# Patient Record
Sex: Female | Born: 1990
Health system: Southern US, Community
[De-identification: ages and names within clinical notes are randomized; demographics above are authoritative.]

## PROBLEM LIST (undated history)

## (undated) DIAGNOSIS — H9191 Unspecified hearing loss, right ear: Secondary | ICD-10-CM

## (undated) DIAGNOSIS — F909 Attention-deficit hyperactivity disorder, unspecified type: Secondary | ICD-10-CM

## (undated) DIAGNOSIS — N926 Irregular menstruation, unspecified: Secondary | ICD-10-CM

## (undated) DIAGNOSIS — T7840XA Allergy, unspecified, initial encounter: Secondary | ICD-10-CM

## (undated) DIAGNOSIS — E282 Polycystic ovarian syndrome: Secondary | ICD-10-CM

## (undated) DIAGNOSIS — G8190 Hemiplegia, unspecified affecting unspecified side: Secondary | ICD-10-CM

## (undated) DIAGNOSIS — Z68.41 Body mass index (BMI) pediatric, greater than or equal to 95th percentile for age: Secondary | ICD-10-CM

## (undated) HISTORY — DX: Attention-deficit hyperactivity disorder, unspecified type: F90.9

## (undated) HISTORY — DX: Hemiplegia, unspecified affecting unspecified side: G81.90

## (undated) HISTORY — DX: Irregular menstruation, unspecified: N92.6

## (undated) HISTORY — DX: Allergy, unspecified, initial encounter: T78.40XA

## (undated) HISTORY — DX: Unspecified hearing loss, right ear: H91.91

## (undated) HISTORY — DX: Body mass index (bmi) pediatric, greater than or equal to 95th percentile for age: Z68.54

## (undated) HISTORY — PX: TONSILLECTOMY: SUR1361

---
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
## 2011-01-07 ENCOUNTER — Telehealth: Payer: Self-pay | Admitting: Internal Medicine

## 2011-01-07 MED ORDER — METHYLPHENIDATE HCL ER (OSM) 36 MG PO TBCR: EXTENDED_RELEASE_TABLET | ORAL | Status: DC

## 2011-01-07 NOTE — Telephone Encounter (Signed)
Pt needs a script for Concerta 36 mg 2 day. Pls call when ready for pick up.

## 2011-01-07 NOTE — Telephone Encounter (Signed)
Pt's mom aware that rx will be ready in the am.

## 2011-02-10 ENCOUNTER — Telehealth: Payer: Self-pay | Admitting: *Deleted

## 2011-02-10 MED ORDER — METHYLPHENIDATE HCL ER (OSM) 36 MG PO TBCR: EXTENDED_RELEASE_TABLET | ORAL | Status: DC

## 2011-02-10 NOTE — Telephone Encounter (Signed)
Mom aware that rx will be ready in am.

## 2011-02-10 NOTE — Telephone Encounter (Signed)
Refill on concerta  

## 2011-02-14 ENCOUNTER — Ambulatory Visit: Payer: Self-pay | Admitting: Internal Medicine

## 2011-02-17 ENCOUNTER — Encounter: Payer: Self-pay | Admitting: Internal Medicine

## 2011-02-18 ENCOUNTER — Encounter: Payer: Self-pay | Admitting: Internal Medicine

## 2011-02-18 ENCOUNTER — Ambulatory Visit (INDEPENDENT_AMBULATORY_CARE_PROVIDER_SITE_OTHER): Payer: Federal, State, Local not specified - PPO | Admitting: Internal Medicine

## 2011-02-18 VITALS — BP 120/80 | HR 72 | Ht 66.0 in | Wt 191.0 lb

## 2011-02-18 DIAGNOSIS — H9190 Unspecified hearing loss, unspecified ear: Secondary | ICD-10-CM

## 2011-02-18 DIAGNOSIS — Z0000 Encounter for general adult medical examination without abnormal findings: Secondary | ICD-10-CM | POA: Insufficient documentation

## 2011-02-18 DIAGNOSIS — Z68.41 Body mass index (BMI) pediatric, greater than or equal to 95th percentile for age: Secondary | ICD-10-CM | POA: Insufficient documentation

## 2011-02-18 DIAGNOSIS — J309 Allergic rhinitis, unspecified: Secondary | ICD-10-CM

## 2011-02-18 DIAGNOSIS — Z00129 Encounter for routine child health examination without abnormal findings: Secondary | ICD-10-CM

## 2011-02-18 DIAGNOSIS — F909 Attention-deficit hyperactivity disorder, unspecified type: Secondary | ICD-10-CM

## 2011-02-18 DIAGNOSIS — D485 Neoplasm of uncertain behavior of skin: Secondary | ICD-10-CM

## 2011-02-18 DIAGNOSIS — J351 Hypertrophy of tonsils: Secondary | ICD-10-CM

## 2011-02-18 DIAGNOSIS — J45909 Unspecified asthma, uncomplicated: Secondary | ICD-10-CM

## 2011-02-18 DIAGNOSIS — Z0100 Encounter for examination of eyes and vision without abnormal findings: Secondary | ICD-10-CM

## 2011-02-18 DIAGNOSIS — IMO0002 Reserved for concepts with insufficient information to code with codable children: Secondary | ICD-10-CM

## 2011-02-18 HISTORY — DX: Body mass index (BMI) pediatric, greater than or equal to 95th percentile for age: Z68.54

## 2011-02-18 HISTORY — DX: Reserved for concepts with insufficient information to code with codable children: IMO0002

## 2011-02-18 LAB — POCT HEMOGLOBIN: Hemoglobin: 13.7

## 2011-02-18 MED ORDER — METHYLPHENIDATE HCL ER (OSM) 36 MG PO TBCR: EXTENDED_RELEASE_TABLET | ORAL | Status: DC

## 2011-02-18 NOTE — Progress Notes (Signed)
Subjective:     History was provided by the Patient.  And mom  Glenda Mendez is a 20 y.o. female who is here for this wellness visit. Since her last visit she's been doing fairly well. No recent asthma attacks. Is on her controller medicine. Did have a skin infection on the scalp treated at [facility]. Check tube bumps on her skin one on the chest and one on the left shoulder. The one on the left shoulder occurred when she was about 15 and with flat and is now bumpy and occasionally tender. ADHD:  Medication seems to be doing well would need refills she is going to go to college next year at [employer] and studied business and anterior design and transferred to [employer] if her grades are good enough. She is to get her tonsils out this summer. June 10  Current Issues: Current concerns include:None  H (Home) Family Relationships: good Communication: good with parents Responsibilities: has responsibilities at home  E (Education): Grades: Cs School: good attendance Future Plans: college  A (Activities) Sports: no sports Exercise: No Activities: music Friends: Yes   A (Auton/Safety) Auto: wears seat belt Bike: does not ride Safety: can swim and uses sunscreen  D (Diet) Diet: balanced diet Risky eating habits: tends to overeat Intake: high fat diet Body Image: positive body image  Drugs Tobacco: No Alcohol: No Drugs: No  Sex Activity: abstinent  Suicide Risk Emotions: anxiety and anger Depression: feelings of depression Suicidal: denies suicidal ideation     Objective:     Filed Vitals:   02/18/11 0858  BP: 120/80  Pulse: 72  Height: 5\' 6"  (1.676 m)  Weight: 191 lb (86.637 kg)   Growth parameters are noted and are appropriate for age.  General:   alert, cooperative and appears stated age  Gait:   normal  Skin:    mid chest there is a 3 mm  cystic type lesion with a central pore. Left shoulder a 5-6 mm raised pink fibrous appearing small with  skin markings on top of it, no central pore   Oral cavity:   lips, mucosa, and tongue normal; teeth and gums normal  tonsils 2+ not inflamed   Eyes:   sclerae white, pupils equal and reactive, red reflex normal bilaterally  Ears:   normal bilaterally decreae hearing   Neck:   normal, supple, no cervical tenderness  Old scar  nochange or new masses   Lungs:  clear to auscultation bilaterally and normal percussion bilaterally  Heart:   regular rate and rhythm, S1, S2 normal, no murmur, click, rub or gallop and normal apical impulse  Abdomen:  soft, non-tender; bowel sounds normal; no masses,  no organomegaly  GU:  normal female  Extremities:   extremities normal, atraumatic, no cyanosis or edema  Neuro:  normal without focal findings, mental status, speech normal, alert and oriented x3, PERLA, reflexes normal and symmetric and gait and station normal  Decrease hearing    I don't elicit grossly the right hemiparesthesias with history of same.     lymph nodes no significant adenopathy Breast: normal by inspection . No dimpling, discharge, masses, tenderness or discharge . Tanner 4-5   Assessment:   Glenda Mendez y.o. female  . Asthma, allergies ADHD New skin lesions growing slowly question fibroma versus other we'll get dermatology to check. Tonsillar hypertrophy recurrent infection    Plan:   1. Anticipatory guidance discussed. HO .  2. Follow-up visit in 12 months for next  wellness visit, or sooner as needed.  6 months for medicine check  We'll do dermatology referral Intensify lifestyle changes her weight has gone back up some from her initial loss with healthy eating. Continue same medication in ADHD discussed with mom also

## 2011-02-18 NOTE — Patient Instructions (Addendum)
ROV in 6 months  WIll arrange a dermatology appt.  Improved  Healthy eating and exercise   Track your periods.  Call if want to  Increase   meds.

## 2011-05-19 ENCOUNTER — Telehealth: Payer: Self-pay | Admitting: Internal Medicine

## 2011-05-19 MED ORDER — METHYLPHENIDATE HCL ER (OSM) 36 MG PO TBCR: EXTENDED_RELEASE_TABLET | ORAL | Status: DC

## 2011-05-19 NOTE — Telephone Encounter (Signed)
Mom stated that patient is on Concerta 36mg  bid. Pharmacy only filled it for 1qd. Pt is almost out.

## 2011-05-19 NOTE — Telephone Encounter (Signed)
Rx is ready to pick up. Mom aware of this.

## 2011-06-04 ENCOUNTER — Encounter: Payer: Self-pay | Admitting: Internal Medicine

## 2011-06-04 ENCOUNTER — Ambulatory Visit (INDEPENDENT_AMBULATORY_CARE_PROVIDER_SITE_OTHER): Payer: Federal, State, Local not specified - PPO | Admitting: Internal Medicine

## 2011-06-04 VITALS — BP 128/72 | HR 88 | Wt 182.0 lb

## 2011-06-04 DIAGNOSIS — Z3009 Encounter for other general counseling and advice on contraception: Secondary | ICD-10-CM

## 2011-06-04 DIAGNOSIS — L708 Other acne: Secondary | ICD-10-CM

## 2011-06-04 DIAGNOSIS — N926 Irregular menstruation, unspecified: Secondary | ICD-10-CM

## 2011-06-04 DIAGNOSIS — R519 Headache, unspecified: Secondary | ICD-10-CM | POA: Insufficient documentation

## 2011-06-04 DIAGNOSIS — L709 Acne, unspecified: Secondary | ICD-10-CM | POA: Insufficient documentation

## 2011-06-04 DIAGNOSIS — R51 Headache: Secondary | ICD-10-CM

## 2011-06-04 DIAGNOSIS — Z30011 Encounter for initial prescription of contraceptive pills: Secondary | ICD-10-CM | POA: Insufficient documentation

## 2011-06-04 HISTORY — DX: Irregular menstruation, unspecified: N92.6

## 2011-06-04 MED ORDER — NORETHINDRONE ACET-ETHINYL EST 1-20 MG-MCG PO TABS: 1.0000 | ORAL_TABLET | Freq: Every day | ORAL | Status: DC

## 2011-06-04 NOTE — Patient Instructions (Addendum)
Take 2 aleve 2 x per day for the next day or so. Get regular sleep. Irregular or not enough sleep  may be adding to your headaches . Can begin hormonal therapy for your periods with your next cycle.   One per day .Track your headaches and bleeding .   Then ROV in 2-3 months .Oral Contraceptives (Birth Control Pills) Oral contraceptives (OCs) are medicines taken to prevent pregnancy. They are the most widely used method of birth control. OCs work by preventing the ovaries from releasing eggs. The OC hormones also cause the mucus on the cervix to thicken, preventing the sperm from entering the uterus. They also cause the lining of the uterus to become thin, not allowing a fertilized egg to attach to the inside of the uterus. OCs have a failure rate of less than 1%, when taken exactly as prescribed. THERE ARE 2 TYPES OF OC  OC that contains a mix of estrogen and progesterone hormones is the most common OC used. It is taken for 21 days, followed by 7 days of not taking the OC hormones. It can be packaged as 28 pills, with the last 7 pills being inactive. You take a pill every day. This way you do not need to remember when to restart taking the active pills. Most women will begin their menstrual period 2 to 3 days after taking the hormone pill. The menstrual period is usually lighter and shorter. This combination OC should not be taken if you are breast-feeding.   The progesterone only (minipill) OC does not contain estrogen. It is taken every day, continuously. You may have only spotting for a period, or no period at all. The progesterone only OC can be taken if you are breast-feeding your baby.  OCs come in:  Packs of 21 pills, with no pills to take for 7 days after the last pill.   Packs of 28 pills, with a pill to take every day. The last 7 pills are without hormones.   Packs of 91 pills (continuous or extended use), with a pill to take every day. The first 84 pills contain the hormones, and the  last 7 pills do not. That is when you will have your menstrual period. You will not have a menstrual period during the time you are taking the first 84 pills.  HOW TO TAKE OC Your caregiver may advise you on how to start taking the first cycle of OCs. Otherwise, you can:  Start on day 1 or day 5 of your menstrual period, taking the first pack of the OC. You will not need any backup contraceptive protection with this start time.   Start on the first Sunday after your menstrual period, day 7 of your menstrual period, or the day you get your prescription. In these cases, you will need backup contraceptive protection for the first cycle.  No matter which day you start the OC, you will always start a new pack on that same day of the week. It is a good idea to have an extra pack of OCs and a backup contraceptive method available, in case you miss some pills or lose your OC pack. COMMON REASONS FOR FAILURE   Forgetting to take the pill at the same time every day.   Poor absorption of the pill from the stomach into the bloodstream. This can be caused by diarrhea, vomiting, and the use of some medicines that kill germs (antibiotics).   Stomach or intestinal disease.   Taking OCs with  other medicines that may make them less effective (carbamazepine, phenytoin, phenobarbital, rifampin).   Using OCs that have passed their expiration dates.   Forgetting to restart the pills on day 7, when using the packs of 21 pills.  If you forget to take 1 pill, take it as soon as you remember, and take the next pill at the regular time. If you miss 2 or more pills, use backup birth control until your next menstrual period starts. Also, you may have vaginal spotting or bleeding when you miss 2 or more OC pills. If you use the pack of 28 pills or 91 pills, and you miss 1 of the last 7 pills (pills with no hormones), it will not matter. Just throw away the rest of the non-hormone pills and start a new 28 or 91 pill  pack. COMMON USES OF OC  Decreasing premenstrual problems (symptoms).  Treating menstrual period cramps.   Avoiding becoming pregnant.   Regulating the menstrual cycle.   Treating acne.   Decreasing the heavy menstrual flow.   Treating dysfunctional (abnormal) uterine bleeding.   Treating chronic pelvic pain.   Treating polycystic ovary syndrome (ovary does not ovulate and produces tiny cysts).   Treating endometriosis (uterus lining growing in the pelvis, tubes, and ovaries).   Can be used for emergency contraception.   OCs DO NOT prevent sexually transmitted diseases (STDs). Safer sex practices, such as using condoms along with the pill, can help prevent STDs.  BENEFITS  OC reduces the risk of:   Cancer of the ovary and uterus.   Ovarian cysts.   Pelvic infection.   Symptoms of polycystic ovary syndrome.   Loss of bone (osteoporosis).   Noncancerous (benign) breast disease (fibrocystic breast changes).   Lack of red blood cells (anemia) from heavy or long menstrual periods.   Pregnancy occurring outside the uterus (tubal pregnancy).   Acne.   Slows down the flow of heavy menstrual periods.   Sometimes helps control premenstrual syndrome (PMS).   Stops menstrual cramps and pain.   Controls irregular menstrual periods.   Can be used as emergency contraception.  YOU SHOULD NOT TAKE THE PILL IF YOU:  Are pregnant, or are trying to get pregnant.  Have unexplained or abnormal vaginal bleeding.   Have a history of liver disease, stroke, or heart attack.   Smoke.   Have a history of blood clots, cancer, or heart problems.   Have gallbladder disease.   Have breast cancer or suspect breast cancer.   Have or suspect pelvic cancer.   Have high blood pressure.  Have high cholesterol or high triglycerides.   Have mental depression.   Are breast-feeding, except for the progesterone only OC, with approval of your caregiver.   Have diabetes with  kidney, eye, or other blood vessel complications. Or if you have diabetes for 20 years or more.   Have heart valve disease.   Have migraine headaches. They may get worse.   Before taking the pill, a woman will have a physical exam and Pap test. Your caregiver may order blood tests to check blood sugar and cholesterol levels, and other blood tests that may be necessary. SIDE EFFECTS OF THE PILL MAY INCLUDE:  Breast tenderness, pain and discharge.   Change in sex drive (increased or decreased libido).   Depression.   Being tired often.   Headaches.   Anxiety.   Irregular spotting or vaginal bleeding for a couple of months.  Leg pain.   Cramps, or  swelling of your limbs (extremities).   Mood swings.   Weight loss or weight gain.   Feeling sick to your stomach (nausea).   Change in appetite (hunger).  Loss of hair.   Yeast or fungus vaginal infection.   Nervousness.   Rash.   Acne.   No menstrual period (amenorrhea).   When starting an OC, it is usually best to allow 2-3 months, if possible, for the body to adjust (before stopping because of side effects). This allows for adjustment to the changes in hormone levels. If a woman continues to have side effects, it may be possible to change to a different OC. It is important to discuss side effects with your caregiver. Often, changing to a different pill causes the side effects to subside. RISKS AND COMPLICATIONS  Blood clots of the leg, heart, lung, or brain.  High blood pressure.   Gallbladder disease.  Liver tumors.   Brain bleeding (hemorrhage).  Slight risk of breast cancer.   HOME CARE INSTRUCTIONS  Do not smoke.   Only take over-the-counter or prescription medicines for pain, discomfort, fever, or breast tenderness as directed by your caregiver.   Always use a condom to protect against sexually transmitted disease. OCs do not protect against STDs.   Keep a calendar, marking your menstrual period days.   Recommendations, types, and dosages of OC use change continually. Discuss your choices with your caregiver, and decide what is best for you. There are always exceptions to guidelines. You should always read the information that comes with the OC, and check whether there are any new recommendations or guidelines. SEEK MEDICAL CARE IF:  You develop nausea and vomiting from the OC.   You have abnormal vaginal discharge.   You need treatment for headaches.   You develop a rash.   You miss your menstrual period.   You develop abnormal vaginal bleeding.   You are losing your hair.   You need treatment for mood swings or depression.   You get dizzy when taking the OC.   You develop acne from taking the OC.  SEEK IMMEDIATE MEDICAL CARE IF:  You develop leg pain.   You develop chest pain.   You develop shortness of breath.   You develop abdominal pain.   You have an uncontrolled headache.   You develop numbness or slurred speech.   You develop visual problems (loss of vision, double or blurry vision).   You develop heavy vaginal bleeding.  If you are taking the pill, STOP RIGHT AWAY and CALL YOUR CAREGIVER IMMEDIATELY if the following occur:  You develop chest pain and shortness of breath.   You develop pain, redness, and swelling in the legs.   You develop severe headaches, visual changes, or belly (abdominal) pain.   You develop severe depression.   You become pregnant.  Document Released: 12/27/2002 Document Re-Released: 10/28/2009 [facility] Patient Information 2011 [place], [place].

## 2011-06-04 NOTE — Progress Notes (Signed)
  Subjective:    Patient ID: Glenda Mendez, female    DOB: April 24, 1991, 20 y.o.   MRN: [id number]  HPI Onset of headache at church and no help with  Med . And  Hard to sleep.   Bifrontal.  Waxes and wanes  . Aggravator ?  mitigator rest but wakes up.   No vision change .   ? Some worsening  With bright lights  .  Had to leave [employer] work.  Sleep less  Before onset of headaches .  Works from 4-10 sometimes. FRiday.   1 am sleep    Weight gone up from sleep issues.   Drinks lots of fluids.  sodas and punch.   No caffiene .    Today less   ? Worse with weather .   Onset 10/10 and now 5/10  Nausea with this. Nose stops up with HA .   Aching pain.  Front .   She is interested in beginning oral contraceptive pills to regulate her periods which sometimes are regular. Sometimes has midcycle spotting. No risk of pregnancy or STD. She tends to get some other kinds of headaches around the time of her period but not this severe. She also would like a better control of her acne and is interested in taking hormones for this reason. Her mother is in agreement with this. Review of Systems Negative for fever or sinus infection chest pain shortness of breath asthma flare. No balance problems or vision changes. Ears get itchy at times. No current vomiting or diarrhea.  Past history family history social history reviewed in the electronic medical record.     Objective:   Physical Exam Well-developed well-nourished in no acute distress HEENT: Normocephalic ;atraumatic , Eyes;  PERRL, EOMs  Full, lids and conjunctiva clear,,Ears: no deformities, canals nl, TM landmarks normal, Nose: no deformity or discharge mildly congested Mouth : OP clear without lesion or edema . Neck no adenopathy Chest:  Clear to A&P without wheezes rales or rhonchi CV:  S1-S2 no gallops or murmurs peripheral perfusion is normal Neuro  oriented x 3 intact facial symmetry gait within normal limits no tremor. No focal atrophy balance  looks normal Skin: Mild acne on face. Mood no obvious depression anxiety good eye contact.     Assessment & Plan:  Headache No alarm features except this is new for her. There may be a migraine component. Further questioning she does seem to have some hormonally. Repeat induced or related headaches.    Acne mild to moderate face Irregular periods history of dysmenorrhea   not alarming but it sounds like she has sometimes  Ovulatory  spotting. No risk for infection or pregnancy Reasonable candidate for hormonal therapy. Discussed risk and benefits clotting potential to make headaches worse instead of better. She will implement lifestyle changes and monitoring her she begins her classes. In followup in 23 months.

## 2011-07-29 ENCOUNTER — Ambulatory Visit (INDEPENDENT_AMBULATORY_CARE_PROVIDER_SITE_OTHER): Payer: Federal, State, Local not specified - PPO | Admitting: Internal Medicine

## 2011-07-29 ENCOUNTER — Encounter: Payer: Self-pay | Admitting: Internal Medicine

## 2011-07-29 VITALS — BP 120/80 | HR 66 | Wt 174.0 lb

## 2011-07-29 DIAGNOSIS — Z23 Encounter for immunization: Secondary | ICD-10-CM

## 2011-07-29 DIAGNOSIS — R51 Headache: Secondary | ICD-10-CM

## 2011-07-29 DIAGNOSIS — Z68.41 Body mass index (BMI) pediatric, greater than or equal to 95th percentile for age: Secondary | ICD-10-CM

## 2011-07-29 DIAGNOSIS — F909 Attention-deficit hyperactivity disorder, unspecified type: Secondary | ICD-10-CM

## 2011-07-29 DIAGNOSIS — N926 Irregular menstruation, unspecified: Secondary | ICD-10-CM

## 2011-07-29 DIAGNOSIS — J45909 Unspecified asthma, uncomplicated: Secondary | ICD-10-CM

## 2011-07-29 MED ORDER — NORETHINDRONE ACET-ETHINYL EST 1-20 MG-MCG PO TABS: 1.0000 | ORAL_TABLET | Freq: Every day | ORAL | Status: DC

## 2011-07-29 NOTE — Assessment & Plan Note (Signed)
Improved on ocps hormonal therapy

## 2011-07-29 NOTE — Assessment & Plan Note (Signed)
Much better   On ocps contnue

## 2011-07-29 NOTE — Patient Instructions (Signed)
Continue  meds Continue lifestyle intervention healthy eating and exercise . Check up in 6 months or as needed

## 2011-07-29 NOTE — Assessment & Plan Note (Signed)
Improved  Has lost to 174 today Counseled. Healthy ls

## 2011-07-29 NOTE — Progress Notes (Signed)
  Subjective:    Patient ID: Glenda Mendez, female    DOB: 09/25/1991, 20 y.o.   MRN: [id number]  HPI Since last time  No more Headaches ( see last notes)   Now on ocps  Less  Bleeding and no cramps.   Has helped moodiness around periods.  Bleeds slightly heavy 2 days and then light . School. Doing ok intimidatading  But like  Its.   MOM says doing much better aroung periods Review of Systems Neg cp sob asthma flare     Objective:   Physical Exam  Wt Readings from Last 3 Encounters:  07/29/11 174 lb (78.926 kg) (93.65%*)  06/04/11 182 lb (82.555 kg) (95.40%*)  02/18/11 191 lb (86.637 kg) (96.74%*)   * Growth percentiles are based on CDC 2-20 Years data.   wdwn in nad  Neck no tenderness Chest:  Clear to A&P without wheezes rales or rhonchi CV:  S1-S2 no gallops or murmurs peripheral perfusion is normal Affect orented good eye contact looks well nl interaction      Assessment & Plan:

## 2011-07-29 NOTE — Assessment & Plan Note (Signed)
Stable on meds  First semester college

## 2011-07-30 ENCOUNTER — Encounter: Payer: Self-pay | Admitting: Internal Medicine

## 2011-09-04 ENCOUNTER — Telehealth: Payer: Self-pay | Admitting: *Deleted

## 2011-09-04 MED ORDER — METHYLPHENIDATE HCL ER (OSM) 36 MG PO TBCR: EXTENDED_RELEASE_TABLET | ORAL | Status: DC

## 2011-09-04 NOTE — Telephone Encounter (Signed)
Refill on concerta Mom aware that rx will be ready tomorrow.

## 2011-09-09 ENCOUNTER — Telehealth: Payer: Self-pay | Admitting: Internal Medicine

## 2011-09-09 NOTE — Telephone Encounter (Signed)
Chart opened in error

## 2011-10-20 ENCOUNTER — Ambulatory Visit (INDEPENDENT_AMBULATORY_CARE_PROVIDER_SITE_OTHER): Payer: Federal, State, Local not specified - PPO | Admitting: Internal Medicine

## 2011-10-20 ENCOUNTER — Encounter: Payer: Self-pay | Admitting: Internal Medicine

## 2011-10-20 VITALS — BP 112/70 | Temp 98.2°F | Wt 172.0 lb

## 2011-10-20 DIAGNOSIS — J069 Acute upper respiratory infection, unspecified: Secondary | ICD-10-CM

## 2011-10-20 DIAGNOSIS — J45909 Unspecified asthma, uncomplicated: Secondary | ICD-10-CM

## 2011-10-20 DIAGNOSIS — J309 Allergic rhinitis, unspecified: Secondary | ICD-10-CM

## 2011-10-20 DIAGNOSIS — J329 Chronic sinusitis, unspecified: Secondary | ICD-10-CM

## 2011-10-20 DIAGNOSIS — R040 Epistaxis: Secondary | ICD-10-CM

## 2011-10-20 MED ORDER — ALBUTEROL SULFATE 0.63 MG/3ML IN NEBU: INHALATION_SOLUTION | RESPIRATORY_TRACT | Status: DC

## 2011-10-20 MED ORDER — PREDNISONE 20 MG PO TABS: ORAL_TABLET | ORAL | Status: AC

## 2011-10-20 MED ORDER — AMOXICILLIN-POT CLAVULANATE 875-125 MG PO TABS: 1.0000 | ORAL_TABLET | Freq: Two times a day (BID) | ORAL | Status: AC

## 2011-10-20 NOTE — Patient Instructions (Signed)
Can add antibiotic for sinusitis if needed.  Will send in rx for box of albuterol  As needed. saline nose moisturizing will help  Call if  persistent or progressive  Expect improvement in the next 3 days or so.

## 2011-10-20 NOTE — Progress Notes (Signed)
  Subjective:    Patient ID: Glenda Mendez, female    DOB: 01-Feb-1991, 20 y.o.   MRN: [id number]  HPI Patient comes in today for SDA  For acute problem evaluation. Here with mom  Onset 3-4 days ago of above  illness   Nose congestion and bleeding  Right nostril .   Sneezing  Pre dating  Couple of weeks.  But no fever with this  Then 3-4 days ago had asthma flare and sob  Began on steroids  Per mom   Given 20 mg   ( few left ) and sat Sunday . With asthma sx.  Stopped up at night .  Sits up at night.   Nose itching and stopped up right more than left .  Cough some better  Asthma    Review of Systems No cp sob fever hemoptysis  No nvd  No rashes     Past history family history social history reviewed in the electronic medical record.     Objective:   Physical Exam  WDWN in NAD  quiet respirations; mildly congested  somewhat hoarse. Non toxic . HEENT: Normocephalic ;atraumatic , Eyes;  PERRL, EOMs  Full, lids and conjunctiva clear,,Ears: no deformities, canals nl, TM landmarks normal, Nose: no deformity or discharge but congested right mucoid yellow dc ;face minimally tender Mouth : OP clear without lesion or edema . Neck: Supple without adenopathy or masses or bruits Chest:  Clear to A&P without wheezes rales or rhonchi CV:  S1-S2 no gallops or murmurs peripheral perfusion is normal Skin :nl perfusion and no acute rashes      Assessment & Plan:   URI poss sinusitis   Viral  and allergic   underlying Nosebleeds   Anterior   Disc conservative management  And fu  If  persistent or progressive  Asthma mild flare    rx given for steroid burst and antibiotic if needed   Albuterol neb ok if needed ,

## 2011-12-23 ENCOUNTER — Telehealth: Payer: Self-pay | Admitting: *Deleted

## 2011-12-23 MED ORDER — METHYLPHENIDATE HCL ER (OSM) 36 MG PO TBCR: EXTENDED_RELEASE_TABLET | ORAL | Status: DC

## 2011-12-23 NOTE — Telephone Encounter (Signed)
Refills given to pt's mom

## 2011-12-26 ENCOUNTER — Telehealth: Payer: Self-pay | Admitting: Internal Medicine

## 2011-12-26 DIAGNOSIS — H9190 Unspecified hearing loss, unspecified ear: Secondary | ICD-10-CM

## 2011-12-26 NOTE — Telephone Encounter (Signed)
Pt need order for diagnostic hearing test at [facility] fax # [phone] Robbi Garter audiologist

## 2011-12-26 NOTE — Telephone Encounter (Signed)
Referral sent to [facility]. 

## 2012-03-03 DIAGNOSIS — R6250 Unspecified lack of expected normal physiological development in childhood: Secondary | ICD-10-CM | POA: Insufficient documentation

## 2012-03-03 DIAGNOSIS — K219 Gastro-esophageal reflux disease without esophagitis: Secondary | ICD-10-CM | POA: Insufficient documentation

## 2012-04-19 ENCOUNTER — Encounter: Payer: Self-pay | Admitting: Internal Medicine

## 2012-04-19 ENCOUNTER — Ambulatory Visit (INDEPENDENT_AMBULATORY_CARE_PROVIDER_SITE_OTHER): Payer: Federal, State, Local not specified - PPO | Admitting: Internal Medicine

## 2012-04-19 VITALS — BP 108/78 | HR 69 | Temp 98.7°F | Wt 188.0 lb

## 2012-04-19 DIAGNOSIS — N926 Irregular menstruation, unspecified: Secondary | ICD-10-CM

## 2012-04-19 DIAGNOSIS — F909 Attention-deficit hyperactivity disorder, unspecified type: Secondary | ICD-10-CM

## 2012-04-19 DIAGNOSIS — Z3041 Encounter for surveillance of contraceptive pills: Secondary | ICD-10-CM

## 2012-04-19 DIAGNOSIS — J45909 Unspecified asthma, uncomplicated: Secondary | ICD-10-CM

## 2012-04-19 MED ORDER — METHYLPHENIDATE HCL ER (OSM) 36 MG PO TBCR: EXTENDED_RELEASE_TABLET | ORAL | Status: DC

## 2012-04-19 MED ORDER — NORETHIN ACE-ETH ESTRAD-FE 1-20 MG-MCG PO TABS: 1.0000 | ORAL_TABLET | Freq: Every day | ORAL | Status: DC

## 2012-04-19 NOTE — Progress Notes (Signed)
Subjective:    Patient ID: Glenda Mendez, female    DOB: 1991-02-19, 21 y.o.   MRN: [id number]  HPI Patient comes in today for follow up of  multiple medical problems.  Med evaluation [facility] helping periods and nose noted would like to continue no sig has bleeding is normal no sig pain.  ADHD taking meds still Has had to take off school cause of money issues  And looking for job  Initially started at [employer] but didn't put her on schedule so looking else where No tad some caffeine sodas . Sleep ok   Review of Systems No cp sob constitutional issues no injury  Mood somewhat down not hopeless can talk to friends no job no car and transportation dependent. now.  Outpatient Encounter Prescriptions as of 04/19/2012  Medication Sig Dispense Refill  . albuterol (ACCUNEB) 0.63 MG/3ML nebulizer solution Please give pt 1 box.  75 mL  2  . azelastine (ASTELIN) 137 MCG/SPRAY nasal spray 1 spray by Nasal route 2 (two) times daily. Use in each nostril as directed       . beclomethasone (QVAR) 40 MCG/ACT inhaler Inhale 2 puffs into the lungs 2 (two) times daily.        . fexofenadine-pseudoephedrine (ALLEGRA-D 12 HOUR) 60-120 MG per tablet Take 1 tablet by mouth 2 (two) times daily.        . formoterol (FORADIL) 12 MCG capsule for inhaler Place 12 mcg into inhaler and inhale 2 (two) times daily.        . hydrOXYzine (ATARAX) 10 MG tablet Take 10 mg by mouth 3 (three) times daily as needed.        Marland Kitchen levocetirizine (XYZAL) 5 MG tablet Take 5 mg by mouth every evening.        . methylphenidate (CONCERTA) 36 MG CR tablet 2 po qd  60 tablet  0  . methylphenidate (CONCERTA) 36 MG CR tablet Take 2 once a day. Fill on or after 05/24/12  60 tablet  0  . methylphenidate (CONCERTA) 36 MG CR tablet Take 2 once a day. Fill on or after 06/24/12  60 tablet  0  . montelukast (SINGULAIR) 10 MG tablet Take 10 mg by mouth at bedtime.       . norethindrone-ethinyl estradiol (JUNEL FE,GILDESS FE,LOESTRIN FE) 1-20 MG-MCG tablet  Take 1 tablet by mouth daily.  1 Package  6  . omeprazole (PRILOSEC) 20 MG capsule Take 20 mg by mouth daily.        Marland Kitchen DISCONTD: methylphenidate (CONCERTA) 36 MG CR tablet Take 2 once a day. Fill on or after 01/23/12  60 tablet  0  . DISCONTD: methylphenidate (CONCERTA) 36 MG CR tablet Take 2 once a day. Fill on or after 02/22/12  60 tablet  0  . DISCONTD: methylphenidate (CONCERTA) 36 MG CR tablet 2 po qd  60 tablet  0  . DISCONTD: norethindrone-ethinyl estradiol (JUNEL FE,GILDESS FE,LOESTRIN FE) 1-20 MG-MCG tablet Take 1 tablet by mouth daily.         Past history family history social history reviewed in the electronic medical record.     Objective:   Physical Exam  BP 108/78  Pulse 69  Temp 98.7 F (37.1 C) (Oral)  Wt 188 lb (85.276 kg)  SpO2 96%  LMP 04/18/2012 Wt Readings from Last 3 Encounters:  04/19/12 188 lb (85.276 kg) (96.01%*)  10/20/11 172 lb (78.019 kg) (92.93%*)  07/29/11 174 lb (78.926 kg) (93.65%*)   * Growth percentiles are based  on CDC 2-20 Years data.  WDWN in nad heent grossly normal well groomed good eye contact somewhat subdued.   Chest:  Clear to A&P without wheezes rales or rhonchi CV:  S1-S2 no gallops or murmurs peripheral perfusion is normal Abdomen:  Sof,t normal bowel sounds without hepatosplenomegaly, no guarding rebound or masses no CVA tenderness No clubbing cyanosis or edema Neuro non focal       Assessment & Plan:  ADHD No se of meds Asthma stable Mood somewhat reactive depression cause of monetary factors and job searching . Disc strategies consider ing counseling  Or as k for help if continuing.or not getting better. OCP ok to continue adequate period control.  Plan reassessment in 6 months or as needed  Total visit > 50% spent counseling and coordinating care

## 2012-04-25 ENCOUNTER — Encounter: Payer: Self-pay | Admitting: Internal Medicine

## 2012-04-25 DIAGNOSIS — Z3041 Encounter for surveillance of contraceptive pills: Secondary | ICD-10-CM | POA: Insufficient documentation

## 2012-04-25 NOTE — Patient Instructions (Signed)
Continue same meds and fu in 5 months or as needed and discussed

## 2012-07-01 ENCOUNTER — Ambulatory Visit (INDEPENDENT_AMBULATORY_CARE_PROVIDER_SITE_OTHER): Payer: Federal, State, Local not specified - PPO | Admitting: Internal Medicine

## 2012-07-01 ENCOUNTER — Encounter: Payer: Self-pay | Admitting: Internal Medicine

## 2012-07-01 VITALS — BP 110/74 | HR 82 | Temp 98.5°F | Wt 200.0 lb

## 2012-07-01 DIAGNOSIS — J309 Allergic rhinitis, unspecified: Secondary | ICD-10-CM

## 2012-07-01 DIAGNOSIS — J329 Chronic sinusitis, unspecified: Secondary | ICD-10-CM

## 2012-07-01 DIAGNOSIS — J069 Acute upper respiratory infection, unspecified: Secondary | ICD-10-CM

## 2012-07-01 DIAGNOSIS — J45909 Unspecified asthma, uncomplicated: Secondary | ICD-10-CM

## 2012-07-01 DIAGNOSIS — Z23 Encounter for immunization: Secondary | ICD-10-CM

## 2012-07-01 MED ORDER — AMOXICILLIN-POT CLAVULANATE 875-125 MG PO TABS: 1.0000 | ORAL_TABLET | Freq: Two times a day (BID) | ORAL | Status: AC

## 2012-07-01 NOTE — Progress Notes (Signed)
Subjective:    Patient ID: Glenda Mendez, female    DOB: 27-Jan-1991, 21 y.o.   MRN: [id number]  HPI Patient comes in today for SDA for  new problem evaluation. She has had 2 days of a sore throat congestion nasal stuffiness and a bit of a cough without wheezing fever strep exposure. She is a general headache but no face pain. No otcs currently just her other allery asthma meds  She is on her asthma medications no new medications.  Review of Systems Negative for chest pain shortness of breath strep exposure or swollen glands unusual rashes.No nvd   Past history family history social history reviewed in the electronic medical record.  Looking for work  At home with mom no ets.   Outpatient Encounter Prescriptions as of 07/01/2012  Medication Sig Dispense Refill  . albuterol (ACCUNEB) 0.63 MG/3ML nebulizer solution Please give pt 1 box.  75 mL  2  . azelastine (ASTELIN) 137 MCG/SPRAY nasal spray 1 spray by Nasal route 2 (two) times daily. Use in each nostril as directed       . beclomethasone (QVAR) 40 MCG/ACT inhaler Inhale 2 puffs into the lungs 2 (two) times daily.        . fexofenadine-pseudoephedrine (ALLEGRA-D 12 HOUR) 60-120 MG per tablet Take 1 tablet by mouth 2 (two) times daily.        . formoterol (FORADIL) 12 MCG capsule for inhaler Place 12 mcg into inhaler and inhale 2 (two) times daily.        . hydrOXYzine (ATARAX) 10 MG tablet Take 10 mg by mouth 3 (three) times daily as needed.        Marland Kitchen levocetirizine (XYZAL) 5 MG tablet Take 5 mg by mouth every evening.        . methylphenidate (CONCERTA) 36 MG CR tablet 2 po qd  60 tablet  0  . methylphenidate (CONCERTA) 36 MG CR tablet Take 2 once a day. Fill on or after 05/24/12  60 tablet  0  . methylphenidate (CONCERTA) 36 MG CR tablet Take 2 once a day. Fill on or after 06/24/12  60 tablet  0  . montelukast (SINGULAIR) 10 MG tablet Take 10 mg by mouth at bedtime.       . norethindrone-ethinyl estradiol (JUNEL FE,GILDESS FE,LOESTRIN  FE) 1-20 MG-MCG tablet Take 1 tablet by mouth daily.  1 Package  6  . omeprazole (PRILOSEC) 20 MG capsule Take 20 mg by mouth daily.             Objective:   Physical Exam  BP 110/74  Pulse 108  Temp 98.5 F (36.9 C) (Oral)  Wt 200 lb (90.719 kg)  SpO2 96%  LMP 06/27/2012 Well-developed well-nourished in no acute distress mildly hoarse Normocephalic eyes clear TMs no acute changes normal landmarks. OP significant cobblestoning no postnasal drainage seen no edema or exudate. Face nontender nostrils congested no discharge seen. Neck without adenopathy well-healed scar Chest clear to auscultation without wheezes or rales cough is bronchial on command no wheezing Cardiac S1-S2 no gallops or murmurs repeat pulse rate in the 80s    Assessment & Plan:  Acute URI with sore throat and congestion probably viral No evidence of bacterial infection at this time but she is at high risk with her asthma  Discuss continue with her asthma medication add antibiotic description written Augmentin if persistent and/or progressive  cw sinusitis discussed signs of use.     Discussed risk benefit of antibiotic  At this  time can wait  And see how she does. .  Asthma stable continue on medications at risk for flare   Flu shot today. Followup when she's due for regular check.

## 2012-07-01 NOTE — Patient Instructions (Addendum)
This appears to be a viral respiratory infection that may take 1-2 weeks to run its course.  However if you're not improving after about 7-10 days  Or getting increasing facial pain sinus pain and drainage and asthma flare you can add the antibiotic for possible bacterial infection. Make sure you stay on your asthma medications to prevent a flare up.  Most sinus infections get better on their own otherwise.  Okay to have flu shot today.    Contact us if you're having unresolved asthma flare fever or or concerns.

## 2012-09-20 ENCOUNTER — Ambulatory Visit: Payer: Federal, State, Local not specified - PPO | Admitting: Internal Medicine

## 2012-09-21 ENCOUNTER — Ambulatory Visit (INDEPENDENT_AMBULATORY_CARE_PROVIDER_SITE_OTHER): Payer: Federal, State, Local not specified - PPO | Admitting: Internal Medicine

## 2012-09-21 ENCOUNTER — Encounter: Payer: Self-pay | Admitting: Internal Medicine

## 2012-09-21 VITALS — BP 118/70 | HR 95 | Temp 98.3°F | Wt 204.0 lb

## 2012-09-21 DIAGNOSIS — J309 Allergic rhinitis, unspecified: Secondary | ICD-10-CM

## 2012-09-21 DIAGNOSIS — J45909 Unspecified asthma, uncomplicated: Secondary | ICD-10-CM

## 2012-09-21 DIAGNOSIS — F909 Attention-deficit hyperactivity disorder, unspecified type: Secondary | ICD-10-CM

## 2012-09-21 DIAGNOSIS — N926 Irregular menstruation, unspecified: Secondary | ICD-10-CM

## 2012-09-21 DIAGNOSIS — Z3041 Encounter for surveillance of contraceptive pills: Secondary | ICD-10-CM

## 2012-09-21 MED ORDER — MONTELUKAST SODIUM 10 MG PO TABS: 10.0000 mg | ORAL_TABLET | Freq: Every day | ORAL | Status: DC

## 2012-09-21 MED ORDER — OMEPRAZOLE 20 MG PO CPDR: 20.0000 mg | DELAYED_RELEASE_CAPSULE | Freq: Every day | ORAL | Status: DC

## 2012-09-21 NOTE — Progress Notes (Signed)
Chief Complaint  Patient presents with  . Follow-up  . ADHD    HPI:  Patient comes in today for follow up of  multiple medical problems.  ADD meds: No significant side effects such as major sleep issues and mood changes, chest pain, shortness of breath, headaches , GI or significant weight loss. Sometimes bed at 2 and up at 10.   Taking same meds  Still looking for a job . ocps: good no pain. With periods Asthma :  Stable on current meds . Has seen specialist at [facility]  A bit discouraged as she has no transportation and mom doesnt let her out to do much because she would have to drive wants to go back to school but money is the issue of being able to do this  ROS: See pertinent positives and negatives per HPI.  Past Medical History  Diagnosis Date  . Asthma   . Pulmonary hemorrhage of fetus or newborn     84 week C-section [facility] of with birth hypoxia  . Hypoxia of newborn   . Hearing loss in right ear     with hearing aid  . Hemiparesis     rt  . Allergy   . ADHD (attention deficit hyperactivity disorder)   . Irregular periods 06/04/2011  . BMI (body mass index), pediatric, 95-99% for age 12/19/2010    Is starting to gain weight again and advised and counseled today about reduction for health risk reasons. Patient is very well aware of how to do lifestyle intervention she has done this before.     Family History  Problem Relation Age of Onset  . Adopted: Yes  . Hypertension    . Schizophrenia      History   Social History  . Marital Status: Single    Spouse Name: N/A    Number of Children: N/A  . Years of Education: N/A   Social History Main Topics  . Smoking status: Never Smoker   . Smokeless tobacco: None  . Alcohol Use: No  . Drug Use: No  . Sexually Active:    Other Topics Concern  . 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 Marcy [facility]ren[facility] now has a new pulmonary Dr.Thyroid evaluation in the past neck evaluation within normal limits and no thyroid disease.    Outpatient Encounter Prescriptions as of 09/21/2012  Medication Sig Dispense Refill  . albuterol (ACCUNEB) 0.63 MG/3ML nebulizer solution Please give pt 1 box.  75 mL  2  . azelastine (ASTELIN) 137 MCG/SPRAY nasal spray 1 spray by Nasal route 2 (two) times daily. Use in each nostril as directed       . beclomethasone (QVAR) 40 MCG/ACT inhaler Inhale 2 puffs into the lungs 2 (two) times daily.        . fexofenadine-pseudoephedrine (ALLEGRA-D 12 HOUR) 60-120 MG per tablet Take 1 tablet by mouth 2 (two) times daily.        . formoterol (FORADIL) 12 MCG capsule for inhaler Place 12 mcg into inhaler and inhale 2 (two) times daily.        . hydrOXYzine (ATARAX) 10 MG tablet  Take 10 mg by mouth 3 (three) times daily as needed.        Marland Kitchen levocetirizine (XYZAL) 5 MG tablet Take 5 mg by mouth every evening.        . methylphenidate (CONCERTA) 36 MG CR tablet 2 po qd  60 tablet  0  . methylphenidate (CONCERTA) 36 MG CR tablet Take 2 once a day. Fill on or after 05/24/12  60 tablet  0  . methylphenidate (CONCERTA) 36 MG CR tablet Take 2 once a day. Fill on or after 06/24/12  60 tablet  0  . montelukast (SINGULAIR) 10 MG tablet Take 1 tablet (10 mg total) by mouth at bedtime.  30 tablet  11  . norethindrone-ethinyl estradiol (JUNEL FE,GILDESS FE,LOESTRIN FE) 1-20 MG-MCG tablet Take 1 tablet by mouth daily.  1 Package  6  . omeprazole (PRILOSEC) 20 MG capsule Take 1 capsule (20 mg total) by mouth daily.  30 capsule  5  . [DISCONTINUED] montelukast (SINGULAIR) 10 MG tablet Take 10 mg by mouth at bedtime.       . [DISCONTINUED] omeprazole (PRILOSEC) 20 MG capsule Take 20 mg by mouth  daily.          EXAM:  BP 118/70  Pulse 95  Temp 98.3 F (36.8 C) (Oral)  Wt 204 lb (92.534 kg)  SpO2 98%  LMP 09/05/2012  There is no height on file to calculate BMI.  GENERAL: vitals reviewed and listed above, alert, oriented, appears well hydrated and in no acute distress  HEENT: atraumatic, conjunctiva  clear, no obvious abnormalities on inspection of external nose and ears OP : no lesion edema or exudate   NECK: no obvious masses on inspection palpation   LUNGS: clear to auscultation bilaterally, no wheezes, rales or rhonchi, good air movement  CV: HRRR, no clubbing cyanosis or  peripheral edema nl cap refill   MS: moves all extremities without noticeable focal  abnormality  PSYCH: pleasant and cooperative, no obvious depression or anxiety   Good eye contact   ASSESSMENT AND PLAN:  Discussed the following assessment and plan:  1. ADHD    continue same meds   2. ASTHMA    continue controller meds   3. ALLERGIC RHINITIS   4. Oral contraceptive pill surveillance    no sig se  continue helps acne and periods also helps with moodiness  5. Irregular periods    better on meds      Disc lsi with weight and strategies ideas to help with appropriate development t move forward. Her long term goal is to go back to school but finances and job market a barrier. Avoid weight gain.  -Patient advised to return or notify health care team  immediately if symptoms worsen or persist or new concerns arise.  Patient Instructions  Continue same medication .   For now . Increase exercise as tolerated     CPX in 6 months or as needed .   Neta Mends. Akshay Spang M.D.  Total visit > 50% spent counseling and coordinating care

## 2012-09-21 NOTE — Patient Instructions (Addendum)
Continue same medication .   For now . Increase exercise as tolerated     CPX in 6 months or as needed .

## 2012-09-25 ENCOUNTER — Encounter: Payer: Self-pay | Admitting: Internal Medicine

## 2012-11-08 ENCOUNTER — Telehealth: Payer: Self-pay | Admitting: Internal Medicine

## 2012-11-08 ENCOUNTER — Encounter: Payer: Self-pay | Admitting: Family Medicine

## 2012-11-08 ENCOUNTER — Ambulatory Visit (INDEPENDENT_AMBULATORY_CARE_PROVIDER_SITE_OTHER): Payer: Federal, State, Local not specified - PPO | Admitting: Family Medicine

## 2012-11-08 VITALS — BP 110/70 | HR 97 | Temp 98.9°F | Wt 211.0 lb

## 2012-11-08 DIAGNOSIS — A088 Other specified intestinal infections: Secondary | ICD-10-CM

## 2012-11-08 DIAGNOSIS — A084 Viral intestinal infection, unspecified: Secondary | ICD-10-CM

## 2012-11-08 DIAGNOSIS — J45909 Unspecified asthma, uncomplicated: Secondary | ICD-10-CM

## 2012-11-08 MED ORDER — ONDANSETRON HCL 4 MG PO TABS: 4.0000 mg | ORAL_TABLET | Freq: Three times a day (TID) | ORAL | Status: DC | PRN

## 2012-11-08 MED ORDER — ALBUTEROL SULFATE 0.63 MG/3ML IN NEBU: INHALATION_SOLUTION | RESPIRATORY_TRACT | Status: DC

## 2012-11-08 NOTE — Progress Notes (Signed)
Chief Complaint  Patient presents with  . severe congestion    sore throat, runny nose, runny eyes, headache since Wednesday, mucus- green/yellow and bloody ; chest tightness     HPI: -started: 2 days ago -symptoms:nasal congestion cough, HA, watery diarrhea and vomiting for 1 day -denies:fever, SOB, wheezing, tooth pain -has tried: albuterol helped and nasal spray -sick contacts: no known sick contacts, no flu exposure -Hx of: asthma - wants refil on neb - has been out for some time - followed by allergy doctor in [place]   ROS: See pertinent positives and negatives per HPI.  Past Medical History  Diagnosis Date  . Asthma   . Pulmonary hemorrhage of fetus or newborn     29 week C-section [facility] of with birth hypoxia  . Hypoxia of newborn   . Hearing loss in right ear     with hearing aid  . Hemiparesis     rt  . Allergy   . ADHD (attention deficit hyperactivity disorder)   . Irregular periods 06/04/2011  . BMI (body mass index), pediatric, 95-99% for age 46/10/2010    Is starting to gain weight again and advised and counseled today about reduction for health risk reasons. Patient is very well aware of how to do lifestyle intervention she has done this before.     Family History  Problem Relation Age of Onset  . Adopted: Yes  . Hypertension    . Schizophrenia      History   Social History  . Marital Status: Single    Spouse Name: N/A    Number of Children: N/A  . Years of Education: N/A   Social History Main Topics  . Smoking status: Never Smoker   . Smokeless tobacco: None  . Alcohol Use: No  . Drug Use: No  . Sexually Active:    Other Topics Concern  . 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 Marcy [facility]ren[facility] pulm at [facility] now has a new pulmonary Dr.Thyroid evaluation in the past neck evaluation within normal limits and no thyroid disease.    Current outpatient prescriptions:albuterol (ACCUNEB) 0.63 MG/3ML nebulizer solution, Please give pt 1 box., Disp: 75 mL, Rfl: 0;  beclomethasone (QVAR) 40 MCG/ACT inhaler, Inhale 2 puffs into the lungs 2 (two) times daily.  , Disp: , Rfl: ;  fexofenadine-pseudoephedrine (ALLEGRA-D 12 HOUR) 60-120 MG per tablet, Take 1 tablet by mouth 2 (two) times daily.  , Disp: , Rfl:  formoterol (FORADIL) 12 MCG capsule for inhaler, Place 12 mcg into inhaler and inhale 2 (two) times daily.  , Disp: , Rfl: ;  hydrOXYzine (ATARAX) 10 MG tablet, Take 10 mg by mouth 3 (three) times daily as needed.  , Disp: , Rfl: ;  levocetirizine (XYZAL) 5 MG tablet, Take 5 mg by mouth every evening.  , Disp: , Rfl: ;  methylphenidate (CONCERTA) 36 MG CR tablet, 2 po qd, Disp: 60 tablet, Rfl: 0 methylphenidate (CONCERTA) 36 MG CR tablet, Take 2 once a day. Fill on or after 05/24/12, Disp: 60 tablet, Rfl: 0;  methylphenidate (CONCERTA) 36 MG CR tablet, Take 2 once a day. Fill on  or after 06/24/12, Disp: 60 tablet, Rfl: 0;  montelukast (SINGULAIR) 10 MG tablet, Take 1 tablet (10 mg total) by mouth at bedtime., Disp: 30 tablet, Rfl: 11 norethindrone-ethinyl estradiol (JUNEL FE,GILDESS FE,LOESTRIN FE) 1-20 MG-MCG tablet, Take 1 tablet by mouth daily., Disp: 1 Package, Rfl: 6;  omeprazole (PRILOSEC) 20 MG capsule, Take 1 capsule (20 mg total) by mouth daily., Disp: 30 capsule, Rfl: 5;  azelastine (ASTELIN) 137 MCG/SPRAY nasal spray, 1 spray by Nasal route 2 (two) times daily. Use in each nostril as directed , Disp: , Rfl:  ondansetron (ZOFRAN) 4 MG tablet, Take 1 tablet (4 mg total) by mouth every 8 (eight) hours as needed for nausea., Disp: 20 tablet, Rfl: 0  EXAM:  Filed Vitals:   11/08/12 1604  BP: 110/70  Pulse: 97  Temp: 98.9 F  (37.2 C)    There is no height on file to calculate BMI.  GENERAL: vitals reviewed and listed above, alert, oriented, appears well hydrated and in no acute distress  HEENT: atraumatic, conjunttiva clear, no obvious abnormalities on inspection of external nose and ears, normal appearance of ear canals and TMs, clear nasal congestion, mild post oropharyngeal erythema with PND, no tonsillar edema or exudate, no sinus TTP  NECK: no obvious masses on inspection  LUNGS: clear to auscultation bilaterally, no wheezes, rales or rhonchi, good air movement  CV: HRRR, no peripheral edema  ABD: BS +, soft, NTTP  MS: moves all extremities without noticeable abnormality  PSYCH: pleasant and cooperative, no obvious depression or anxiety  ASSESSMENT AND PLAN:  Discussed the following assessment and plan:  1. Asthma  albuterol (ACCUNEB) 0.63 MG/3ML nebulizer solution  2. Viral gastroenteritis  ondansetron (ZOFRAN) 4 MG tablet   -symptoms c/w viral illness, lung exam normal - no coughing or signs of resp distress on exam. Supportive care and return precautions. Refilled albuterol per her request. -Patient advised to return or notify a doctor immediately if symptoms worsen or persist or new concerns arise.  Patient Instructions  Viral Gastroenteritis Viral gastroenteritis is also known as stomach flu. This condition affects the stomach and intestinal tract. It can cause sudden diarrhea and vomiting. The illness typically lasts 3 to 8 days. Most people develop an immune response that eventually gets rid of the virus. While this natural response develops, the virus can make you quite ill. CAUSES  Many different viruses can cause gastroenteritis, such as rotavirus or noroviruses. You can catch one of these viruses by consuming contaminated food or water. You may also catch a virus by sharing utensils or other personal items with an infected person or by touching a contaminated surface. SYMPTOMS  The  most common symptoms are diarrhea and vomiting. These problems can cause a severe loss of body fluids (dehydration) and a body salt (electrolyte) imbalance. Other symptoms may include:  Fever.  Headache.  Fatigue.  Abdominal pain. DIAGNOSIS  Your caregiver can usually diagnose viral gastroenteritis based on your symptoms and a physical exam. A stool sample may also be taken to test for the presence of viruses or other infections. TREATMENT  This illness typically goes away on its own. Treatments are aimed at rehydration. The most serious cases of viral gastroenteritis involve vomiting so severely that you are not able to keep fluids down. In these cases, fluids must be given through an intravenous line (IV). HOME CARE INSTRUCTIONS   Drink enough fluids to keep your urine clear or pale yellow. Drink small amounts of fluids frequently and increase the amounts  as tolerated.  Ask your caregiver for specific rehydration instructions.  Avoid:  Foods high in sugar.  Alcohol.  Carbonated drinks.  Tobacco.  Juice.  Caffeine drinks.  Extremely hot or cold fluids.  Fatty, greasy foods.  Too much intake of anything at one time.  Dairy products until 24 to 48 hours after diarrhea stops.  You may consume probiotics. Probiotics are active cultures of beneficial bacteria. They may lessen the amount and number of diarrheal stools in adults. Probiotics can be found in yogurt with active cultures and in supplements.  Wash your hands well to avoid spreading the virus.  Only take over-the-counter or prescription medicines for pain, discomfort, or fever as directed by your caregiver. Do not give aspirin to children. Antidiarrheal medicines are not recommended.  Ask your caregiver if you should continue to take your regular prescribed and over-the-counter medicines.  Keep all follow-up appointments as directed by your caregiver. SEEK IMMEDIATE MEDICAL CARE IF:   You are unable to keep  fluids down.  You do not urinate at least once every 6 to 8 hours.  You develop shortness of breath.  You notice blood in your stool or vomit. This may look like coffee grounds.  You have abdominal pain that increases or is concentrated in one small area (localized).  You have persistent vomiting or diarrhea.  You have a fever.  The patient is a child younger than 3 months, and he or she has a fever.  The patient is a child older than 3 months, and he or she has a fever and persistent symptoms.  The patient is a child older than 3 months, and he or she has a fever and symptoms suddenly get worse.  The patient is a baby, and he or she has no tears when crying. MAKE SURE YOU:   Understand these instructions.  Will watch your condition.  Will get help right away if you are not doing well or get worse. Document Released: 10/06/2005 Document Revised: 12/29/2011 Document Reviewed: 07/23/2011 [facility] Patient Information 2013 [place], [place].  SEE your allergist or another doctor immediately if any asthma symptoms not responding to the albuterol      KIM, HANNAH R.

## 2012-11-08 NOTE — Telephone Encounter (Signed)
Dr. Fabian Sharp would not mind seeing this patient but her schedule is booked for this afternoon.  Please call the mother and see if they are willing to see someone else.

## 2012-11-08 NOTE — Telephone Encounter (Signed)
Patient's mom called requesting an appt today for her daughter for congestion/lb

## 2012-11-08 NOTE — Telephone Encounter (Signed)
appt scheduled with Dr. Selena Batten at [facility]

## 2012-11-08 NOTE — Patient Instructions (Addendum)
Viral Gastroenteritis Viral gastroenteritis is also known as stomach flu. This condition affects the stomach and intestinal tract. It can cause sudden diarrhea and vomiting. The illness typically lasts 3 to 8 days. Most people develop an immune response that eventually gets rid of the virus. While this natural response develops, the virus can make you quite ill. CAUSES  Many different viruses can cause gastroenteritis, such as rotavirus or noroviruses. You can catch one of these viruses by consuming contaminated food or water. You may also catch a virus by sharing utensils or other personal items with an infected person or by touching a contaminated surface. SYMPTOMS  The most common symptoms are diarrhea and vomiting. These problems can cause a severe loss of body fluids (dehydration) and a body salt (electrolyte) imbalance. Other symptoms may include:  Fever.  Headache.  Fatigue.  Abdominal pain. DIAGNOSIS  Your caregiver can usually diagnose viral gastroenteritis based on your symptoms and a physical exam. A stool sample may also be taken to test for the presence of viruses or other infections. TREATMENT  This illness typically goes away on its own. Treatments are aimed at rehydration. The most serious cases of viral gastroenteritis involve vomiting so severely that you are not able to keep fluids down. In these cases, fluids must be given through an intravenous line (IV). HOME CARE INSTRUCTIONS   Drink enough fluids to keep your urine clear or pale yellow. Drink small amounts of fluids frequently and increase the amounts as tolerated.  Ask your caregiver for specific rehydration instructions.  Avoid:  Foods high in sugar.  Alcohol.  Carbonated drinks.  Tobacco.  Juice.  Caffeine drinks.  Extremely hot or cold fluids.  Fatty, greasy foods.  Too much intake of anything at one time.  Dairy products until 24 to 48 hours after diarrhea stops.  You may consume probiotics.  Probiotics are active cultures of beneficial bacteria. They may lessen the amount and number of diarrheal stools in adults. Probiotics can be found in yogurt with active cultures and in supplements.  Wash your hands well to avoid spreading the virus.  Only take over-the-counter or prescription medicines for pain, discomfort, or fever as directed by your caregiver. Do not give aspirin to children. Antidiarrheal medicines are not recommended.  Ask your caregiver if you should continue to take your regular prescribed and over-the-counter medicines.  Keep all follow-up appointments as directed by your caregiver. SEEK IMMEDIATE MEDICAL CARE IF:   You are unable to keep fluids down.  You do not urinate at least once every 6 to 8 hours.  You develop shortness of breath.  You notice blood in your stool or vomit. This may look like coffee grounds.  You have abdominal pain that increases or is concentrated in one small area (localized).  You have persistent vomiting or diarrhea.  You have a fever.  The patient is a child younger than 3 months, and he or she has a fever.  The patient is a child older than 3 months, and he or she has a fever and persistent symptoms.  The patient is a child older than 3 months, and he or she has a fever and symptoms suddenly get worse.  The patient is a baby, and he or she has no tears when crying. MAKE SURE YOU:   Understand these instructions.  Will watch your condition.  Will get help right away if you are not doing well or get worse. Document Released: 10/06/2005 Document Revised: 12/29/2011 Document Reviewed: 07/23/2011   ExitCare Patient Information 2013 [place], [place].  SEE your allergist or another doctor immediately if any asthma symptoms not responding to the albuterol

## 2012-11-09 ENCOUNTER — Other Ambulatory Visit: Payer: Self-pay

## 2012-11-09 ENCOUNTER — Ambulatory Visit: Payer: Federal, State, Local not specified - PPO | Admitting: Internal Medicine

## 2012-12-08 ENCOUNTER — Telehealth: Payer: Self-pay | Admitting: Internal Medicine

## 2012-12-08 NOTE — Telephone Encounter (Signed)
Pt needs new rx generic concerta 36 mg °

## 2012-12-09 ENCOUNTER — Other Ambulatory Visit: Payer: Self-pay | Admitting: Family Medicine

## 2012-12-09 MED ORDER — METHYLPHENIDATE HCL ER (OSM) 36 MG PO TBCR: EXTENDED_RELEASE_TABLET | ORAL | Status: DC

## 2012-12-09 NOTE — Telephone Encounter (Signed)
Patient notified to pick up at the front desk. 

## 2013-01-31 DIAGNOSIS — L700 Acne vulgaris: Secondary | ICD-10-CM | POA: Insufficient documentation

## 2013-03-09 ENCOUNTER — Telehealth: Payer: Self-pay | Admitting: Internal Medicine

## 2013-03-09 NOTE — Telephone Encounter (Signed)
Pt needs new rx generic concerta 36 mg °

## 2013-03-10 ENCOUNTER — Other Ambulatory Visit: Payer: Self-pay | Admitting: Family Medicine

## 2013-03-10 MED ORDER — METHYLPHENIDATE HCL ER (OSM) 36 MG PO TBCR: EXTENDED_RELEASE_TABLET | ORAL | Status: DC

## 2013-03-10 NOTE — Telephone Encounter (Signed)
Left message on machine for the pt to pick up rx.

## 2013-05-12 ENCOUNTER — Telehealth: Payer: Self-pay | Admitting: Internal Medicine

## 2013-05-12 NOTE — Telephone Encounter (Signed)
Pt needs refill of methylphenidate (CONCERTA) 36 MG CR tablet.( 3 months) Pt will pick up at Monday appt.

## 2013-05-13 NOTE — Telephone Encounter (Signed)
No note needed since pt will pick up at appt.

## 2013-05-16 ENCOUNTER — Ambulatory Visit (INDEPENDENT_AMBULATORY_CARE_PROVIDER_SITE_OTHER): Payer: Medicaid Other | Admitting: Internal Medicine

## 2013-05-16 ENCOUNTER — Encounter: Payer: Self-pay | Admitting: Internal Medicine

## 2013-05-16 VITALS — BP 116/82 | HR 77 | Temp 98.4°F | Wt 215.0 lb

## 2013-05-16 DIAGNOSIS — Z3041 Encounter for surveillance of contraceptive pills: Secondary | ICD-10-CM

## 2013-05-16 DIAGNOSIS — H9190 Unspecified hearing loss, unspecified ear: Secondary | ICD-10-CM

## 2013-05-16 DIAGNOSIS — J45909 Unspecified asthma, uncomplicated: Secondary | ICD-10-CM

## 2013-05-16 DIAGNOSIS — J4540 Moderate persistent asthma, uncomplicated: Secondary | ICD-10-CM

## 2013-05-16 DIAGNOSIS — F909 Attention-deficit hyperactivity disorder, unspecified type: Secondary | ICD-10-CM

## 2013-05-16 DIAGNOSIS — G8190 Hemiplegia, unspecified affecting unspecified side: Secondary | ICD-10-CM

## 2013-05-16 MED ORDER — METHYLPHENIDATE HCL ER (OSM) 36 MG PO TBCR: EXTENDED_RELEASE_TABLET | ORAL | Status: DC

## 2013-05-16 MED ORDER — NORETHIN ACE-ETH ESTRAD-FE 1-20 MG-MCG PO TABS: 1.0000 | ORAL_TABLET | Freq: Every day | ORAL | Status: DC

## 2013-05-16 NOTE — Patient Instructions (Addendum)
No change in meds at this time   I will send form and letter . It is possible you may need more documentation but you are a candiate for accommodations that we discussed.   Get  Your sleep pattern  Right  Schedule time for study  Out side of classes   Use the on campus services .Marland Kitchen    Wellness and med check in 6 months

## 2013-05-16 NOTE — Progress Notes (Signed)
Chief Complaint  Patient presents with  . Follow-up    Needs refills of Concerta and bc pills.    HPI: Patient comes in today for follow up of  multiple medical problems.   OCP   Handling  Pms    Bleeding  Not as heavy about 5 days. Wants to stay on this regimen no side effects Right hand in a splint wrist pain and difficulty Dr Jorge Mandril says it's from some overuse in the back she has a mild right hemiparesthesia some weakness using the splint as needed As needed   ADD concerta. Medications still is helping with Focused  And gets things done   Memory  needs refill  Sleep off schedule  but she is going to attend [employer] seen the fall. Is registered for disabled student services and needs a letter of support for accommodations.  Taking  3 classes's on tues and Thursday .   Signed up for special services .    Classes start in 2 weeks.   She is seen at the [facility] pulmonary service for her moderate persistent asthma and chronic lung disease of prematurity. Her PFTs are very good and the plan was to try to wean her controller medicine and inhalers.  She is deaf in the right ear and some impairment  in the left ear ROS: See pertinent positives and negatives per HPI.  Past Medical History  Diagnosis Date  . Asthma   . Pulmonary hemorrhage of fetus or newborn     57 week C-section [facility] of with birth hypoxia  . Hypoxia of newborn   . Hearing loss in right ear     with hearing aid  . Hemiparesis     rt from neonatal period  . Allergy   . ADHD (attention deficit hyperactivity disorder)   . Irregular periods 06/04/2011  . BMI (body mass index), pediatric, 95-99% for age 51/10/2010    Is starting to gain weight again and advised and counseled today about reduction for health risk reasons. Patient is very well aware of how to do lifestyle intervention she has done this before.     Family History  Problem Relation Age of Onset  . Adopted: Yes  . Hypertension    . Schizophrenia      History    Social History  . Marital Status: Single    Spouse Name: N/A    Number of Children: N/A  . Years of Education: N/A   Social History Main Topics  . Smoking status: Never Smoker   . Smokeless tobacco: None  . Alcohol Use: No  . Drug Use: No  . Sexually Active:    Other Topics Concern  . 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 Marcy Siren- pulm at [facility] now has a new pulmonary Dr.      Thyroid evaluation in the past neck evaluation within normal limits and no thyroid disease.    Outpatient Encounter Prescriptions as of 05/16/2013  Medication Sig Dispense Refill  . albuterol (ACCUNEB) 0.63 MG/3ML nebulizer solution Please give pt 1  box.  75 mL  0  . azelastine (ASTELIN) 137 MCG/SPRAY nasal spray 1 spray by Nasal route 2 (two) times daily. Use in each nostril as directed       . fexofenadine-pseudoephedrine (ALLEGRA-D 12 HOUR) 60-120 MG per tablet Take 1 tablet by mouth 2 (two) times daily.        . formoterol (FORADIL) 12 MCG capsule for inhaler Place 12 mcg into inhaler and inhale 2 (two) times daily.        . hydrOXYzine (ATARAX) 10 MG tablet Take 10 mg by mouth 3 (three) times daily as needed.        Marland Kitchen levocetirizine (XYZAL) 5 MG tablet Take 5 mg by mouth every evening.        . methylphenidate (CONCERTA) 36 MG CR tablet Take 2 once a day.  60 tablet  0  . methylphenidate (CONCERTA) 36 MG CR tablet Take 2 once a day.  60 tablet  0  . methylphenidate (CONCERTA) 36 MG CR tablet 2 po qd  60 tablet  0  . montelukast (SINGULAIR) 10 MG tablet Take 1 tablet (10 mg total) by mouth at bedtime.  30 tablet  11  . norethindrone-ethinyl estradiol (JUNEL  FE,GILDESS FE,LOESTRIN FE) 1-20 MG-MCG tablet Take 1 tablet by mouth daily.  1 Package  11  . omeprazole (PRILOSEC) 20 MG capsule Take 1 capsule (20 mg total) by mouth daily.  30 capsule  5  . ondansetron (ZOFRAN) 4 MG tablet Take 1 tablet (4 mg total) by mouth every 8 (eight) hours as needed for nausea.  20 tablet  0  . [DISCONTINUED] beclomethasone (QVAR) 40 MCG/ACT inhaler Inhale 2 puffs into the lungs 2 (two) times daily.        . [DISCONTINUED] methylphenidate (CONCERTA) 36 MG CR tablet Take 2 once a day. Fill Second  60 tablet  0  . [DISCONTINUED] methylphenidate (CONCERTA) 36 MG CR tablet Take 2 once a day. Fill last  60 tablet  0  . [DISCONTINUED] methylphenidate (CONCERTA) 36 MG CR tablet 2 po qd  60 tablet  0  . [DISCONTINUED] norethindrone-ethinyl estradiol (JUNEL FE,GILDESS FE,LOESTRIN FE) 1-20 MG-MCG tablet Take 1 tablet by mouth daily.  1 Package  6   No facility-administered encounter medications on file as of 05/16/2013.    EXAM:  BP 116/82  Pulse 77  Temp(Src) 98.4 F (36.9 C) (Oral)  Wt 215 lb (97.523 kg)  BMI 34.72 kg/m2  SpO2 99%  Body mass index is 34.72 kg/(m^2).  GENERAL: vitals reviewed and listed above, alert, oriented, appears well hydrated and in no acute distress  HEENT: atraumatic, conjunctiva  clear, no obvious abnormalities on inspection of external nose and ears  NECK: no obvious masses on inspection palpation no adenopathy LUNGS: clear to auscultation bilaterally, no wheezes, rales or rhonchi, good air movement CV: HRRR, no clubbing cyanosis or  peripheral edema nl cap refill  Abdomen soft without organomegaly guarding or rebound MS: moves all extremities  wearing a splint in the right forearm l Neuro no tremor gait normal PSYCH: pleasant and cooperative, no obvious depression or anxiety  ASSESSMENT AND PLAN:  Discussed the following assessment and plan:  ADHD - Continue medication will write letter of support  Unspecified hearing loss  Oral  contraceptive pill surveillance - No contraindications doing well on medicine  Moderate persistent asthma without complication - CLD of prematurity  Hemiparesis  -Patient advised to return or notify health care team  if symptoms worsen or persist or new  concerns arise.  Patient Instructions  No change in meds at this time   I will send form and letter . It is possible you may need more documentation but you are a candiate for accommodations that we discussed.   Get  Your sleep pattern  Right  Schedule time for study  Out side of classes   Use the on campus services .Marland Kitchen    Wellness and med check in 6 months    Neta Mends. Isabelly Kobler M.D.

## 2013-05-19 ENCOUNTER — Encounter: Payer: Self-pay | Admitting: Internal Medicine

## 2013-05-19 DIAGNOSIS — G8190 Hemiplegia, unspecified affecting unspecified side: Secondary | ICD-10-CM | POA: Insufficient documentation

## 2013-05-19 DIAGNOSIS — J4540 Moderate persistent asthma, uncomplicated: Secondary | ICD-10-CM | POA: Insufficient documentation

## 2013-05-25 ENCOUNTER — Encounter: Payer: Self-pay | Admitting: Internal Medicine

## 2013-05-26 ENCOUNTER — Other Ambulatory Visit: Payer: Self-pay | Admitting: Internal Medicine

## 2013-05-27 ENCOUNTER — Encounter: Payer: Self-pay | Admitting: Internal Medicine

## 2013-05-30 ENCOUNTER — Encounter: Payer: Self-pay | Admitting: Internal Medicine

## 2013-06-02 ENCOUNTER — Encounter: Payer: Self-pay | Admitting: Internal Medicine

## 2013-07-21 ENCOUNTER — Telehealth: Payer: Self-pay | Admitting: Internal Medicine

## 2013-07-21 NOTE — Telephone Encounter (Signed)
Pt needs referral to [facility]ologist for hearing test. Pt has hearing aids. Please fax to [facility] [phone]

## 2013-07-25 NOTE — Telephone Encounter (Signed)
Ok to send.  What dx?

## 2013-07-25 NOTE — Telephone Encounter (Signed)
Sent to the front to be faxed. 

## 2013-07-25 NOTE — Telephone Encounter (Signed)
Please do referral Dx neonatal hearing loss

## 2013-08-01 ENCOUNTER — Other Ambulatory Visit: Payer: Self-pay | Admitting: Internal Medicine

## 2013-08-05 ENCOUNTER — Ambulatory Visit (INDEPENDENT_AMBULATORY_CARE_PROVIDER_SITE_OTHER): Payer: Federal, State, Local not specified - PPO

## 2013-08-05 DIAGNOSIS — Z23 Encounter for immunization: Secondary | ICD-10-CM

## 2013-09-08 ENCOUNTER — Encounter: Payer: Self-pay | Admitting: *Deleted

## 2013-09-08 ENCOUNTER — Telehealth: Payer: Self-pay | Admitting: Internal Medicine

## 2013-09-08 NOTE — Telephone Encounter (Signed)
Please schedule

## 2013-09-08 NOTE — Telephone Encounter (Signed)
Pt has poss sinus inf. Prefer to see dr Fabian Sharp. Mom would like to get in Friday anytime if possible. Mom states breathing is not bad.very congested. Eyes look weak.  Ok to schedule?

## 2013-09-08 NOTE — Telephone Encounter (Signed)
done

## 2013-09-09 ENCOUNTER — Ambulatory Visit (INDEPENDENT_AMBULATORY_CARE_PROVIDER_SITE_OTHER): Payer: Federal, State, Local not specified - PPO | Admitting: Internal Medicine

## 2013-09-09 ENCOUNTER — Encounter: Payer: Self-pay | Admitting: Internal Medicine

## 2013-09-09 VITALS — BP 136/70 | HR 101 | Temp 98.4°F | Wt 226.0 lb

## 2013-09-09 DIAGNOSIS — J329 Chronic sinusitis, unspecified: Secondary | ICD-10-CM

## 2013-09-09 DIAGNOSIS — J45909 Unspecified asthma, uncomplicated: Secondary | ICD-10-CM

## 2013-09-09 DIAGNOSIS — J069 Acute upper respiratory infection, unspecified: Secondary | ICD-10-CM

## 2013-09-09 DIAGNOSIS — F909 Attention-deficit hyperactivity disorder, unspecified type: Secondary | ICD-10-CM

## 2013-09-09 MED ORDER — METHYLPHENIDATE HCL ER (OSM) 36 MG PO TBCR: EXTENDED_RELEASE_TABLET | ORAL | Status: DC

## 2013-09-09 MED ORDER — ALBUTEROL SULFATE HFA 108 (90 BASE) MCG/ACT IN AERS: 2.0000 | INHALATION_SPRAY | Freq: Four times a day (QID) | RESPIRATORY_TRACT | Status: DC | PRN

## 2013-09-09 MED ORDER — BECLOMETHASONE DIPROPIONATE 40 MCG/ACT IN AERS: 2.0000 | INHALATION_SPRAY | Freq: Two times a day (BID) | RESPIRATORY_TRACT | Status: DC

## 2013-09-09 MED ORDER — AMOXICILLIN 500 MG PO CAPS: 500.0000 mg | ORAL_CAPSULE | Freq: Three times a day (TID) | ORAL | Status: DC

## 2013-09-09 NOTE — Patient Instructions (Signed)
Take another 5 - 7 days of amoxicillin  Restart the q var during illness to prevent wheezing  Albuterol inhaler as needed.  Wellness visit and med check in march or April  2015

## 2013-09-09 NOTE — Progress Notes (Signed)
Chief Complaint  Patient presents with  . Nasal Congestion    Started Saturday.  Has had no fever.  Has been taking left over amoxicilan.  . Cough  . Scratchy Throat  . Post Nasal Drip    HPI: Patient comes in today for SDA for  new problem evaluation. Here with mom today Onset about a week ago nasal cong and drainage  And coughed last pm Sneezed some.   Started som amoxicillin 3 times a day for 4 days and she feels it has been breaking up the mucus and improvement she comes in today on Friday before the weekend because she began coughing     No fever.  No wheezing is off of the Qvar as per specialist to use when she gets sick but she hasn't started it yet. She hasn't used albuterol because of no need doesn't have an HFA inhaler at home.  Needs refill of the Concerta has been serving her well she is in school or semester and her grades are good. ROS: See pertinent positives and negatives per HPI. Mom states that she tends to have a body odor that is not controllable when she gets sick. They have tried different soaps and deodorants without significant help. Note different eating.   Past Medical History  Diagnosis Date  . Asthma   . Pulmonary hemorrhage of fetus or newborn     61 week C-section [facility] of with birth hypoxia  . Hypoxia of newborn   . Hearing loss in right ear     with hearing aid  . Hemiparesis     rt from neonatal period  . Allergy   . ADHD (attention deficit hyperactivity disorder)   . Irregular periods 06/04/2011  . BMI (body mass index), pediatric, 95-99% for age 34/10/2010    Is starting to gain weight again and advised and counseled today about reduction for health risk reasons. Patient is very well aware of how to do lifestyle intervention she has done this before.     Family History  Problem Relation Age of Onset  . Adopted: Yes  . Hypertension    . Schizophrenia Mother     with alcohol drug abuse    History   Social History  . Marital Status: Single   Spouse Name: N/A    Number of Children: N/A  . Years of Education: N/A   Social History Main Topics  . Smoking status: Never Smoker   . Smokeless tobacco: None  . Alcohol Use: No  . Drug Use: No  . Sexual Activity:    Other Topics Concern  . 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 Marcy Siren- pulm at [facility] now has a new pulmonary Dr.      Thyroid evaluation in the past neck evaluation within normal limits and no thyroid disease.    Outpatient Encounter Prescriptions as of 09/09/2013  Medication Sig  . albuterol (ACCUNEB) 0.63 MG/3ML nebulizer solution Please give pt 1 box.  Marland Kitchen azelastine (ASTELIN) 137 MCG/SPRAY nasal spray 1 spray by Nasal route 2 (two)  times daily. Use in each nostril as directed   . fexofenadine-pseudoephedrine (ALLEGRA-D 12 HOUR) 60-120 MG per tablet Take 1 tablet by mouth 2 (two) times daily.    . hydrOXYzine (ATARAX) 10 MG tablet Take 10 mg by mouth 3 (three) times daily as needed.    Colleen Can FE 1/20 1-20 MG-MCG tablet TAKE ONE (1) TABLET EACH DAY  . levocetirizine (XYZAL) 5 MG tablet Take 5 mg by mouth every evening.    . methylphenidate (CONCERTA) 36 MG CR tablet Take 2 once a day.  . methylphenidate (CONCERTA) 36 MG CR tablet Take 2 once a day.  . methylphenidate (CONCERTA) 36 MG CR tablet Take 2 once a day.  . montelukast (SINGULAIR) 10 MG tablet Take 1 tablet (10 mg total) by mouth at bedtime.  Marland Kitchen omeprazole (PRILOSEC) 20 MG capsule TAKE ONE (1) CAPSULE EACH DAY  . ondansetron (ZOFRAN) 4 MG tablet Take 1 tablet (4 mg total) by mouth every 8 (eight) hours as needed for nausea.  . [DISCONTINUED] methylphenidate (CONCERTA)  36 MG CR tablet Take 2 once a day.  . [DISCONTINUED] methylphenidate (CONCERTA) 36 MG CR tablet Take 2 once a day.  . [DISCONTINUED] methylphenidate (CONCERTA) 36 MG CR tablet 2 po qd  . albuterol (PROVENTIL HFA;VENTOLIN HFA) 108 (90 BASE) MCG/ACT inhaler Inhale 2 puffs into the lungs every 6 (six) hours as needed for wheezing or shortness of breath.  Marland Kitchen amoxicillin (AMOXIL) 500 MG capsule Take 1 capsule (500 mg total) by mouth 3 (three) times daily.  . beclomethasone (QVAR) 40 MCG/ACT inhaler Inhale 2 puffs into the lungs 2 (two) times daily.  . formoterol (FORADIL) 12 MCG capsule for inhaler Place 12 mcg into inhaler and inhale 2 (two) times daily.      EXAM:  BP 136/70  Pulse 101  Temp(Src) 98.4 F (36.9 C) (Oral)  Wt 226 lb (102.513 kg)  SpO2 98%  Body mass index is 36.49 kg/(m^2).  WDWN in NAD  quiet respirations; mildly congested . Non toxic . HEENT: Normocephalic ;atraumatic , Eyes;  PERRL, EOMs  Full, lids and conjunctiva clear,,Ears: no deformities, canals nl, TM landmarks normal,  Hearing assistance Nose: no deformity or discharge but congested; mucoid dc face  Non  tender Mouth : OP clear without lesion or edema . Neck: Supple without adenopathy  Chest:  Clear to A  without wheezes rales or rhonchi CV:  S1-S2 no gallops or murmurs peripheral perfusion is normal Skin :nl perfusion and no acute rashes  PSYCH: pleasant and cooperative, no obvious depression or anxiety  ASSESSMENT AND PLAN:  Discussed the following assessment and plan:  Acute upper respiratory infections of unspecified site  Sinusitis - partly rx self  poss viral  rx amox to finish course of med for 5-7 more days  Unspecified asthma(493.90) - restatart the q var   Attention deficit disorder with hyperactivity(314.01) - med continues to help in academics no sig se  continue refill  At risk for complications  -Patient advised to return or notify health care team  if symptoms worsen or persist or new  concerns arise.  Patient Instructions  Take another 5 - 7 days of amoxicillin  Restart the q var during illness to prevent wheezing  Albuterol inhaler as needed.  Wellness visit and med check in march or April  2015   Neta Mends. Shauntee Karp M.D.

## 2013-11-08 ENCOUNTER — Other Ambulatory Visit: Payer: Self-pay | Admitting: Internal Medicine

## 2013-11-09 ENCOUNTER — Telehealth: Payer: Self-pay | Admitting: Family Medicine

## 2013-11-09 NOTE — Telephone Encounter (Signed)
This patient should have made an appointment for this month.  Please call the patient and make her yearly physical appt.  Thanks!

## 2013-11-10 NOTE — Telephone Encounter (Signed)
lmom for pt to sch appt °

## 2013-11-10 NOTE — Telephone Encounter (Signed)
appt made

## 2013-11-13 ENCOUNTER — Other Ambulatory Visit: Payer: Self-pay | Admitting: Internal Medicine

## 2013-11-28 ENCOUNTER — Ambulatory Visit (INDEPENDENT_AMBULATORY_CARE_PROVIDER_SITE_OTHER): Payer: Federal, State, Local not specified - PPO | Admitting: Internal Medicine

## 2013-11-28 ENCOUNTER — Other Ambulatory Visit (HOSPITAL_COMMUNITY)
Admission: RE | Admit: 2013-11-28 | Discharge: 2013-11-28 | Disposition: A | Discharge status: Home / Self Care | Payer: Federal, State, Local not specified - PPO | Source: Ambulatory Visit | Attending: Internal Medicine | Admitting: Internal Medicine

## 2013-11-28 ENCOUNTER — Encounter: Payer: Self-pay | Admitting: Internal Medicine

## 2013-11-28 VITALS — BP 126/80 | HR 81 | Temp 98.1°F | Ht 65.25 in | Wt 230.0 lb

## 2013-11-28 DIAGNOSIS — Z124 Encounter for screening for malignant neoplasm of cervix: Secondary | ICD-10-CM | POA: Insufficient documentation

## 2013-11-28 DIAGNOSIS — J45909 Unspecified asthma, uncomplicated: Secondary | ICD-10-CM

## 2013-11-28 DIAGNOSIS — E669 Obesity, unspecified: Secondary | ICD-10-CM | POA: Insufficient documentation

## 2013-11-28 DIAGNOSIS — R635 Abnormal weight gain: Secondary | ICD-10-CM

## 2013-11-28 DIAGNOSIS — Z01419 Encounter for gynecological examination (general) (routine) without abnormal findings: Secondary | ICD-10-CM

## 2013-11-28 DIAGNOSIS — Z113 Encounter for screening for infections with a predominantly sexual mode of transmission: Secondary | ICD-10-CM | POA: Insufficient documentation

## 2013-11-28 DIAGNOSIS — Z0000 Encounter for general adult medical examination without abnormal findings: Secondary | ICD-10-CM | POA: Insufficient documentation

## 2013-11-28 DIAGNOSIS — Z3041 Encounter for surveillance of contraceptive pills: Secondary | ICD-10-CM

## 2013-11-28 DIAGNOSIS — F909 Attention-deficit hyperactivity disorder, unspecified type: Secondary | ICD-10-CM

## 2013-11-28 LAB — BASIC METABOLIC PANEL
BUN: 15 mg/dL (ref 6–23)
CO2: 24 mEq/L (ref 19–32)
Calcium: 9.3 mg/dL (ref 8.4–10.5)
Chloride: 105 mEq/L (ref 96–112)
Creatinine, Ser: 0.9 mg/dL (ref 0.4–1.2)
GFR: 105.55 mL/min (ref >60.00)
Glucose, Bld: 78 mg/dL (ref 70–99)
Potassium: 3.4 mEq/L — ABNORMAL LOW (ref 3.5–5.1)
Sodium: 139 mEq/L (ref 135–145)

## 2013-11-28 LAB — CBC WITH DIFFERENTIAL/PLATELET
Basophils Absolute: 0 10*3/uL (ref 0.0–0.1)
Basophils Relative: 0.3 % (ref 0.0–3.0)
Eosinophils Absolute: 0.1 10*3/uL (ref 0.0–0.7)
Eosinophils Relative: 0.9 % (ref 0.0–5.0)
HCT: 43.1 % (ref 36.0–46.0)
Hemoglobin: 13.7 g/dL (ref 12.0–15.0)
Lymphocytes Relative: 35 % (ref 12.0–46.0)
Lymphs Abs: 2.5 10*3/uL (ref 0.7–4.0)
MCHC: 31.7 g/dL (ref 30.0–36.0)
MCV: 76.4 fl — ABNORMAL LOW (ref 78.0–100.0)
Monocytes Absolute: 0.4 10*3/uL (ref 0.1–1.0)
Monocytes Relative: 5.7 % (ref 3.0–12.0)
Neutro Abs: 4.1 10*3/uL (ref 1.4–7.7)
Neutrophils Relative %: 58.1 % (ref 43.0–77.0)
Platelets: 367 10*3/uL (ref 150.0–400.0)
RBC: 5.65 Mil/uL — ABNORMAL HIGH (ref 3.87–5.11)
RDW: 14.5 % (ref 11.5–14.6)
WBC: 7.1 10*3/uL (ref 4.5–10.5)

## 2013-11-28 LAB — HEPATIC FUNCTION PANEL
ALT: 28 U/L (ref 0–35)
AST: 21 U/L (ref 0–37)
Albumin: 4.3 g/dL (ref 3.5–5.2)
Alkaline Phosphatase: 96 U/L (ref 39–117)
Bilirubin, Direct: 0 mg/dL (ref 0.0–0.3)
Total Bilirubin: 0.4 mg/dL (ref 0.3–1.2)
Total Protein: 8.2 g/dL (ref 6.0–8.3)

## 2013-11-28 LAB — LIPID PANEL
Cholesterol: 180 mg/dL (ref 0–200)
HDL: 72.8 mg/dL (ref >39.00)
LDL Cholesterol: 96 mg/dL (ref 0–99)
Total CHOL/HDL Ratio: 2
Triglycerides: 58 mg/dL (ref 0.0–149.0)
VLDL: 11.6 mg/dL (ref 0.0–40.0)

## 2013-11-28 LAB — TSH: TSH: 2.62 u[IU]/mL (ref 0.35–5.50)

## 2013-11-28 MED ORDER — METHYLPHENIDATE HCL ER (OSM) 36 MG PO TBCR: EXTENDED_RELEASE_TABLET | ORAL | Status: DC

## 2013-11-28 MED ORDER — MONTELUKAST SODIUM 10 MG PO TABS: ORAL_TABLET | ORAL | Status: DC

## 2013-11-28 NOTE — Patient Instructions (Addendum)
Periods can be shorter and lighter on OCPS. This is normal.  Monitor  Eating and drinking  For 2 weeks and decrease portion sizes  150 minutes of exercise weeks  ,  Lose weight  To healthy levels. Avoid trans fats and processed foods;  Increase fresh fruits and veges to 5 servings per day. And avoid sweet beverages  Including tea and juice.  Exercise to Lose Weight Exercise and a healthy diet may help you lose weight. Your doctor may suggest specific exercises. EXERCISE IDEAS AND TIPS  Choose low-cost things you enjoy doing, such as walking, bicycling, or exercising to workout videos.  Take stairs instead of the elevator.  Walk during your lunch break.  Park your car further away from work or school.  Go to a gym or an exercise class.  Start with 5 to 10 minutes of exercise each day. Build up to 30 minutes of exercise 4 to 6 days a week.  Wear shoes with good support and comfortable clothes.  Stretch before and after working out.  Work out until you breathe harder and your heart beats faster.  Drink extra water when you exercise.  Do not do so much that you hurt yourself, feel dizzy, or get very short of breath. Exercises that burn about 150 calories:  Running 1  miles in 15 minutes.  Playing volleyball for 45 to 60 minutes.  Washing and waxing a car for 45 to 60 minutes.  Playing touch football for 45 minutes.  Walking 1  miles in 35 minutes.  Pushing a stroller 1  miles in 30 minutes.  Playing basketball for 30 minutes.  Raking leaves for 30 minutes.  Bicycling 5 miles in 30 minutes.  Walking 2 miles in 30 minutes.  Dancing for 30 minutes.  Shoveling snow for 15 minutes.  Swimming laps for 20 minutes.  Walking up stairs for 15 minutes.  Bicycling 4 miles in 15 minutes.  Gardening for 30 to 45 minutes.  Jumping rope for 15 minutes.  Washing windows or floors for 45 to 60 minutes. Document Released: 11/08/2010 Document Revised: 12/29/2011 Document  Reviewed: 11/08/2010 [facility] Patient Information 2014 [place], [place]. Calorie Counting Diet A calorie counting diet requires you to eat the number of calories that are right for you in a day. Calories are the measurement of how much energy you get from the food you eat. Eating the right amount of calories is important for staying at a healthy weight. If you eat too many calories, your body will store them as fat and you may gain weight. If you eat too few calories, you may lose weight. Counting the number of calories you eat during a day will help you know if you are eating the right amount. A Registered Dietitian can determine how many calories you need in a day. The amount of calories needed varies from person to person. If your goal is to lose weight, you will need to eat fewer calories. Losing weight can benefit you if you are overweight or have health problems such as heart disease, high blood pressure, or diabetes. If your goal is to gain weight, you will need to eat more calories. Gaining weight may be necessary if you have a certain health problem that causes your body to need more energy. TIPS Whether you are increasing or decreasing the number of calories you eat during a day, it may be hard to get used to changes in what you eat and drink. The following are tips to  help you keep track of the number of calories you eat.  Measure foods at home with measuring cups. This helps you know the amount of food and number of calories you are eating.  Restaurants often serve food in amounts that are larger than 1 serving. While eating out, estimate how many servings of a food you are given. For example, a serving of cooked rice is  cup or about the size of half of a fist. Knowing serving sizes will help you be aware of how much food you are eating at restaurants.  Ask for smaller portion sizes or child-size portions at restaurants.  Plan to eat half of a meal at a restaurant. Take the rest home or  share the other half with a friend.  Read the Nutrition Facts panel on food labels for calorie content and serving size. You can find out how many servings are in a package, the size of a serving, and the number of calories each serving has.  For example, a package might contain 3 cookies. The Nutrition Facts panel on that package says that 1 serving is 1 cookie. Below that, it will say there are 3 servings in the container. The calories section of the Nutrition Facts label says there are 90 calories. This means there are 90 calories in 1 cookie (1 serving). If you eat 1 cookie you have eaten 90 calories. If you eat all 3 cookies, you have eaten 270 calories (3 servings x 90 calories = 270 calories). The list below tells you how big or small some common portion sizes are.  1 oz.........4 stacked dice.  3 oz........Marland KitchenDeck of cards.  1 tsp.......Marland KitchenTip of little finger.  1 tbs......Marland KitchenMarland KitchenThumb.  2 tbs.......Marland KitchenGolf ball.   cup......Marland KitchenHalf of a fist.  1 cup.......Marland KitchenA fist. KEEP A FOOD LOG Write down every food item you eat, the amount you eat, and the number of calories in each food you eat during the day. At the end of the day, you can add up the total number of calories you have eaten. It may help to keep a list like the one below. Find out the calorie information by reading the Nutrition Facts panel on food labels. Breakfast  Bran cereal (1 cup, 110 calories).  Fat-free milk ( cup, 45 calories). Snack  Apple (1 medium, 80 calories). Lunch  Spinach (1 cup, 20 calories).  Tomato ( medium, 20 calories).  Chicken breast strips (3 oz, 165 calories).  Shredded cheddar cheese ( cup, 110 calories).  Light [place] dressing (2 tbs, 60 calories).  Whole-wheat bread (1 slice, 80 calories).  Tub margarine (1 tsp, 35 calories).  Vegetable soup (1 cup, 160 calories). Dinner  Pork chop (3 oz, 190 calories).  Brown rice (1 cup, 215 calories).  Steamed broccoli ( cup, 20  calories).  Strawberries (1  cup, 65 calories).  Whipped cream (1 tbs, 50 calories). Daily Calorie Total: 4562 Document Released: 10/06/2005 Document Revised: 12/29/2011 Document Reviewed: 04/02/2007 [facility] Patient Information 2014 [place].    Preventive Care for Adults, Female A healthy lifestyle and preventive care can promote health and wellness. Preventive health guidelines for women include the following key practices.  A routine yearly physical is a good way to check with your health care provider about your health and preventive screening. It is a chance to share any concerns and updates on your health and to receive a thorough exam.  Visit your dentist for a routine exam and preventive care every 6 months. Brush your teeth twice  a day and floss once a day. Good oral hygiene prevents tooth decay and gum disease.  The frequency of eye exams is based on your age, health, family medical history, use of contact lenses, and other factors. Follow your health care provider's recommendations for frequency of eye exams.  Eat a healthy diet. Foods like vegetables, fruits, whole grains, low-fat dairy products, and lean protein foods contain the nutrients you need without too many calories. Decrease your intake of foods high in solid fats, added sugars, and salt. Eat the right amount of calories for you.Get information about a proper diet from your health care provider, if necessary.  Regular physical exercise is one of the most important things you can do for your health. Most adults should get at least 150 minutes of moderate-intensity exercise (any activity that increases your heart rate and causes you to sweat) each week. In addition, most adults need muscle-strengthening exercises on 2 or more days a week.  Maintain a healthy weight. The body mass index (BMI) is a screening tool to identify possible weight problems. It provides an estimate of body fat based on height and weight.  Your health care provider can find your BMI, and can help you achieve or maintain a healthy weight.For adults 20 years and older:  A BMI below 18.5 is considered underweight.  A BMI of 18.5 to 24.9 is normal.  A BMI of 25 to 29.9 is considered overweight.  A BMI of 30 and above is considered obese.  Maintain normal blood lipids and cholesterol levels by exercising and minimizing your intake of saturated fat. Eat a balanced diet with plenty of fruit and vegetables. Blood tests for lipids and cholesterol should begin at age 50 and be repeated every 5 years. If your lipid or cholesterol levels are high, you are over 50, or you are at high risk for heart disease, you may need your cholesterol levels checked more frequently.Ongoing high lipid and cholesterol levels should be treated with medicines if diet and exercise are not working.  If you smoke, find out from your health care provider how to quit. If you do not use tobacco, do not start.  Lung cancer screening is recommended for adults aged 57 80 years who are at high risk for developing lung cancer because of a history of smoking. A yearly low-dose CT scan of the lungs is recommended for people who have at least a 30-pack-year history of smoking and are a current smoker or have quit within the past 15 years. A pack year of smoking is smoking an average of 1 pack of cigarettes a day for 1 year (for example: 1 pack a day for 30 years or 2 packs a day for 15 years). Yearly screening should continue until the smoker has stopped smoking for at least 15 years. Yearly screening should be stopped for people who develop a health problem that would prevent them from having lung cancer treatment.  If you are pregnant, do not drink alcohol. If you are breastfeeding, be very cautious about drinking alcohol. If you are not pregnant and choose to drink alcohol, do not have more than 1 drink per day. One drink is considered to be 12 ounces (355 mL) of beer, 5  ounces (148 mL) of wine, or 1.5 ounces (44 mL) of liquor.  Avoid use of street drugs. Do not share needles with anyone. Ask for help if you need support or instructions about stopping the use of drugs.  High blood pressure causes  heart disease and increases the risk of stroke. Your blood pressure should be checked at least every 1 to 2 years. Ongoing high blood pressure should be treated with medicines if weight loss and exercise do not work.  If you are 59 22 years old, ask your health care provider if you should take aspirin to prevent strokes.  Diabetes screening involves taking a blood sample to check your fasting blood sugar level. This should be done once every 3 years, after age 20, if you are within normal weight and without risk factors for diabetes. Testing should be considered at a younger age or be carried out more frequently if you are overweight and have at least 1 risk factor for diabetes.  Breast cancer screening is essential preventive care for women. You should practice "breast self-awareness." This means understanding the normal appearance and feel of your breasts and may include breast self-examination. Any changes detected, no matter how small, should be reported to a health care provider. Women in their 22s and 30s should have a clinical breast exam (CBE) by a health care provider as part of a regular health exam every 1 to 3 years. After age 37, women should have a CBE every year. Starting at age 79, women should consider having a mammogram (breast X-ray test) every year. Women who have a family history of breast cancer should talk to their health care provider about genetic screening. Women at a high risk of breast cancer should talk to their health care providers about having an MRI and a mammogram every year.  Breast cancer gene (BRCA)-related cancer risk assessment is recommended for women who have family members with BRCA-related cancers. BRCA-related cancers include breast,  ovarian, tubal, and peritoneal cancers. Having family members with these cancers may be associated with an increased risk for harmful changes (mutations) in the breast cancer genes BRCA1 and BRCA2. Results of the assessment will determine the need for genetic counseling and BRCA1 and BRCA2 testing.  The Pap test is a screening test for cervical cancer. A Pap test can show cell changes on the cervix that might become cervical cancer if left untreated. A Pap test is a procedure in which cells are obtained and examined from the lower end of the uterus (cervix).  Women should have a Pap test starting at age 42.  Between ages 6 and 32, Pap tests should be repeated every 2 years.  Beginning at age 78, you should have a Pap test every 3 years as long as the past 3 Pap tests have been normal.  Some women have medical problems that increase the chance of getting cervical cancer. Talk to your health care provider about these problems. It is especially important to talk to your health care provider if a new problem develops soon after your last Pap test. In these cases, your health care provider may recommend more frequent screening and Pap tests.  The above recommendations are the same for women who have or have not gotten the vaccine for human papillomavirus (HPV).  If you had a hysterectomy for a problem that was not cancer or a condition that could lead to cancer, then you no longer need Pap tests. Even if you no longer need a Pap test, a regular exam is a good idea to make sure no other problems are starting.  If you are between ages 21 and 26 years, and you have had normal Pap tests going back 10 years, you no longer need Pap tests. Even if  you no longer need a Pap test, a regular exam is a good idea to make sure no other problems are starting.  If you have had past treatment for cervical cancer or a condition that could lead to cancer, you need Pap tests and screening for cancer for at least 20 years  after your treatment.  If Pap tests have been discontinued, risk factors (such as a new sexual partner) need to be reassessed to determine if screening should be resumed.  The HPV test is an additional test that may be used for cervical cancer screening. The HPV test looks for the virus that can cause the cell changes on the cervix. The cells collected during the Pap test can be tested for HPV. The HPV test could be used to screen women aged 29 years and older, and should be used in women of any age who have unclear Pap test results. After the age of 78, women should have HPV testing at the same frequency as a Pap test.  Colorectal cancer can be detected and often prevented. Most routine colorectal cancer screening begins at the age of 25 years and continues through age 67 years. However, your health care provider may recommend screening at an earlier age if you have risk factors for colon cancer. On a yearly basis, your health care provider may provide home test kits to check for hidden blood in the stool. Use of a small camera at the end of a tube, to directly examine the colon (sigmoidoscopy or colonoscopy), can detect the earliest forms of colorectal cancer. Talk to your health care provider about this at age 29, when routine screening begins. Direct exam of the colon should be repeated every 5 10 years through age 71 years, unless early forms of pre-cancerous polyps or small growths are found.  People who are at an increased risk for hepatitis B should be screened for this virus. You are considered at high risk for hepatitis B if:  You were born in a country where hepatitis B occurs often. Talk with your health care provider about which countries are considered high risk.  Your parents were born in a high-risk country and you have not received a shot to protect against hepatitis B (hepatitis B vaccine).  You have HIV or AIDS.  You use needles to inject street drugs.  You live with, or have  sex with, someone who has Hepatitis B.  You get hemodialysis treatment.  You take certain medicines for conditions like cancer, organ transplantation, and autoimmune conditions.  Hepatitis C blood testing is recommended for all people born from [birth year] through 1964 and any individual with known risks for hepatitis C.  Practice safe sex. Use condoms and avoid high-risk sexual practices to reduce the spread of sexually transmitted infections (STIs). STIs include gonorrhea, chlamydia, syphilis, trichomonas, herpes, HPV, and human immunodeficiency virus (HIV). Herpes, HIV, and HPV are viral illnesses that have no cure. They can result in disability, cancer, and death. Sexually active women aged 53 years and younger should be checked for chlamydia. Older women with new or multiple partners should also be tested for chlamydia. Testing for other STIs is recommended if you are sexually active and at increased risk.  Osteoporosis is a disease in which the bones lose minerals and strength with aging. This can result in serious bone fractures or breaks. The risk of osteoporosis can be identified using a bone density scan. Women ages 43 years and over and women at risk for fractures or  osteoporosis should discuss screening with their health care providers. Ask your health care provider whether you should take a calcium supplement or vitamin D to reduce the rate of osteoporosis.  Menopause can be associated with physical symptoms and risks. Hormone replacement therapy is available to decrease symptoms and risks. You should talk to your health care provider about whether hormone replacement therapy is right for you.  Use sunscreen. Apply sunscreen liberally and repeatedly throughout the day. You should seek shade when your shadow is shorter than you. Protect yourself by wearing long sleeves, pants, a wide-brimmed hat, and sunglasses year round, whenever you are outdoors.  Once a month, do a whole body skin exam,  using a mirror to look at the skin on your back. Tell your health care provider of new moles, moles that have irregular borders, moles that are larger than a pencil eraser, or moles that have changed in shape or color.  Stay current with required vaccines (immunizations).  Influenza vaccine. All adults should be immunized every year.  Tetanus, diphtheria, and acellular pertussis (Td, Tdap) vaccine. Pregnant women should receive 1 dose of Tdap vaccine during each pregnancy. The dose should be obtained regardless of the length of time since the last dose. Immunization is preferred during the 27th 36th week of gestation. An adult who has not previously received Tdap or who does not know her vaccine status should receive 1 dose of Tdap. This initial dose should be followed by tetanus and diphtheria toxoids (Td) booster doses every 10 years. Adults with an unknown or incomplete history of completing a 3-dose immunization series with Td-containing vaccines should begin or complete a primary immunization series including a Tdap dose. Adults should receive a Td booster every 10 years.  Varicella vaccine. An adult without evidence of immunity to varicella should receive 2 doses or a second dose if she has previously received 1 dose. Pregnant females who do not have evidence of immunity should receive the first dose after pregnancy. This first dose should be obtained before leaving the health care facility. The second dose should be obtained 4 8 weeks after the first dose.  Human papillomavirus (HPV) vaccine. Females aged 18 26 years who have not received the vaccine previously should obtain the 3-dose series. The vaccine is not recommended for use in pregnant females. However, pregnancy testing is not needed before receiving a dose. If a female is found to be pregnant after receiving a dose, no treatment is needed. In that case, the remaining doses should be delayed until after the pregnancy. Immunization is  recommended for any person with an immunocompromised condition through the age of 77 years if she did not get any or all doses earlier. During the 3-dose series, the second dose should be obtained 4 8 weeks after the first dose. The third dose should be obtained 24 weeks after the first dose and 16 weeks after the second dose.  Zoster vaccine. One dose is recommended for adults aged 54 years or older unless certain conditions are present.  Measles, mumps, and rubella (MMR) vaccine. Adults born before [birth year] generally are considered immune to measles and mumps. Adults born in [birth year] or later should have 1 or more doses of MMR vaccine unless there is a contraindication to the vaccine or there is laboratory evidence of immunity to each of the three diseases. A routine second dose of MMR vaccine should be obtained at least 28 days after the first dose for students attending postsecondary schools, health care workers, or  international travelers. People who received inactivated measles vaccine or an unknown type of measles vaccine during 1963 1967 should receive 2 doses of MMR vaccine. People who received inactivated mumps vaccine or an unknown type of mumps vaccine before 1979 and are at high risk for mumps infection should consider immunization with 2 doses of MMR vaccine. For females of childbearing age, rubella immunity should be determined. If there is no evidence of immunity, females who are not pregnant should be vaccinated. If there is no evidence of immunity, females who are pregnant should delay immunization until after pregnancy. Unvaccinated health care workers born before [birth year] who lack laboratory evidence of measles, mumps, or rubella immunity or laboratory confirmation of disease should consider measles and mumps immunization with 2 doses of MMR vaccine or rubella immunization with 1 dose of MMR vaccine.  Pneumococcal 13-valent conjugate (PCV13) vaccine. When indicated, a person who is uncertain of her  immunization history and has no record of immunization should receive the PCV13 vaccine. An adult aged 61 years or older who has certain medical conditions and has not been previously immunized should receive 1 dose of PCV13 vaccine. This PCV13 should be followed with a dose of pneumococcal polysaccharide (PPSV23) vaccine. The PPSV23 vaccine dose should be obtained at least 8 weeks after the dose of PCV13 vaccine. An adult aged 29 years or older who has certain medical conditions and previously received 1 or more doses of PPSV23 vaccine should receive 1 dose of PCV13. The PCV13 vaccine dose should be obtained 1 or more years after the last PPSV23 vaccine dose.  Pneumococcal polysaccharide (PPSV23) vaccine. When PCV13 is also indicated, PCV13 should be obtained first. All adults aged 50 years and older should be immunized. An adult younger than age 33 years who has certain medical conditions should be immunized. Any person who resides in a nursing home or long-term care facility should be immunized. An adult smoker should be immunized. People with an immunocompromised condition and certain other conditions should receive both PCV13 and PPSV23 vaccines. People with human immunodeficiency virus (HIV) infection should be immunized as soon as possible after diagnosis. Immunization during chemotherapy or radiation therapy should be avoided. Routine use of PPSV23 vaccine is not recommended for American Indians, [place] Natives, or people younger than 65 years unless there are medical conditions that require PPSV23 vaccine. When indicated, people who have unknown immunization and have no record of immunization should receive PPSV23 vaccine. One-time revaccination 5 years after the first dose of PPSV23 is recommended for people aged 53 64 years who have chronic kidney failure, nephrotic syndrome, asplenia, or immunocompromised conditions. People who received 1 2 doses of PPSV23 before age 42 years should receive another  dose of PPSV23 vaccine at age 38 years or later if at least 5 years have passed since the previous dose. Doses of PPSV23 are not needed for people immunized with PPSV23 at or after age 54 years.  Meningococcal vaccine. Adults with asplenia or persistent complement component deficiencies should receive 2 doses of quadrivalent meningococcal conjugate (MenACWY-D) vaccine. The doses should be obtained at least 2 months apart. Microbiologists working with certain meningococcal bacteria, [employer], people at risk during an outbreak, and people who travel to or live in countries with a high rate of meningitis should be immunized. A first-year college student up through age 61 years who is living in a residence hall should receive a dose if she did not receive a dose on or after her 16th birthday. Adults who have  certain high-risk conditions should receive one or more doses of vaccine.  Hepatitis A vaccine. Adults who wish to be protected from this disease, have certain high-risk conditions, work with hepatitis A-infected animals, work in hepatitis A research labs, or travel to or work in countries with a high rate of hepatitis A should be immunized. Adults who were previously unvaccinated and who anticipate close contact with an international adoptee during the first 60 days after arrival in the [place] from a country with a high rate of hepatitis A should be immunized.  Hepatitis B vaccine. Adults who wish to be protected from this disease, have certain high-risk conditions, may be exposed to blood or other infectious body fluids, are household contacts or sex partners of hepatitis B positive people, are clients or workers in certain care facilities, or travel to or work in countries with a high rate of hepatitis B should be immunized.  Haemophilus influenzae type b (Hib) vaccine. A previously unvaccinated person with asplenia or sickle cell disease or having a scheduled splenectomy should receive 1  dose of Hib vaccine. Regardless of previous immunization, a recipient of a hematopoietic stem cell transplant should receive a 3-dose series 6 12 months after her successful transplant. Hib vaccine is not recommended for adults with HIV infection. Preventive Services / Frequency Ages 79 to 39years  Blood pressure check.** / Every 1 to 2 years.  Lipid and cholesterol check.** / Every 5 years beginning at age 30.  Clinical breast exam.** / Every 3 years for women in their 29s and 70s.  BRCA-related cancer risk assessment.** / For women who have family members with a BRCA-related cancer (breast, ovarian, tubal, or peritoneal cancers).  Pap test.** / Every 2 years from ages 34 through 37. Every 3 years starting at age 24 through age 42 or 24 with a history of 3 consecutive normal Pap tests.  HPV screening.** / Every 3 years from ages 66 through ages 29 to 12 with a history of 3 consecutive normal Pap tests.  Hepatitis C blood test.** / For any individual with known risks for hepatitis C.  Skin self-exam. / Monthly.  Influenza vaccine. / Every year.  Tetanus, diphtheria, and acellular pertussis (Tdap, Td) vaccine.** / Consult your health care provider. Pregnant women should receive 1 dose of Tdap vaccine during each pregnancy. 1 dose of Td every 10 years.  Varicella vaccine.** / Consult your health care provider. Pregnant females who do not have evidence of immunity should receive the first dose after pregnancy.  HPV vaccine. / 3 doses over 6 months, if 75 and younger. The vaccine is not recommended for use in pregnant females. However, pregnancy testing is not needed before receiving a dose.  Measles, mumps, rubella (MMR) vaccine.** / You need at least 1 dose of MMR if you were born in 1957 or later. You may also need a 2nd dose. For females of childbearing age, rubella immunity should be determined. If there is no evidence of immunity, females who are not pregnant should be vaccinated. If  there is no evidence of immunity, females who are pregnant should delay immunization until after pregnancy.  Pneumococcal 13-valent conjugate (PCV13) vaccine.** / Consult your health care provider.  Pneumococcal polysaccharide (PPSV23) vaccine.** / 1 to 2 doses if you smoke cigarettes or if you have certain conditions.  Meningococcal vaccine.** / 1 dose if you are age 33 to 75 years and a Market researcher living in a residence hall, or have one of several medical conditions,  you need to get vaccinated against meningococcal disease. You may also need additional booster doses.  Hepatitis A vaccine.** / Consult your health care provider.  Hepatitis B vaccine.** / Consult your health care provider.  Haemophilus influenzae type b (Hib) vaccine.** / Consult your health care provider.

## 2013-11-28 NOTE — Progress Notes (Signed)
Chief Complaint  Patient presents with  . Annual Exam  . ADHD  . Asthma    HPI: Patient comes in today for Preventive Health Care visit    Asthma doing ok.  Periods  Shorter   Amount .  4 days shorter  And 3 days.otherwise ocps doing well   ADD: school ok. 9 hours .      Passed  Classes.  Last semester.    Personal  [employer]  Somewhat down cause not having a job; mom has to drive her to campus   Some concern about weight gain snacks on "junck" at night  worrried as she only has one carotid artery patent  Had exam  In past.  No sa o   No hx of pap smear.  Health Maintenance  Topic Date Due  . Pap Smear  10/04/2010  . Tetanus/tdap  10/05/2011  . Influenza Vaccine  05/20/2014   Health Maintenance Review   ROS:  GEN/ HEENT: No fever, significant weight changes sweats headaches vision problems hearing changes, CV/ PULM; No chest pain shortness of breath cough, syncope,edema  change in exercise tolerance. GI /GU: No adominal pain, vomiting, change in bowel habits. No blood in the stool. No significant GU symptoms. SKIN/HEME: ,no acute skin rashes suspicious lesions or bleeding. No lymphadenopathy, nodules, masses.  NEURO/ PSYCH:  No neurologic signs such as weakness numbness.sometimes down but only ocass  Otherwise no  depression  Hopelessness  excessive anxiety. IMM/ Allergy: No unusual infections.  Allergy .   REST of 12 system review negative except as per HPI   Past Medical History  Diagnosis Date  . Asthma   . Pulmonary hemorrhage of fetus or newborn     73 week C-section [facility] of with birth hypoxia  . Hypoxia of newborn   . Hearing loss in right ear     with hearing aid bilateral   . Hemiparesis     rt from neonatal period  . Allergy   . ADHD (attention deficit hyperactivity disorder)   . Irregular periods 06/04/2011  . BMI (body mass index), pediatric, 95-99% for age 85/10/2010    Is starting to gain weight again and advised and counseled  today about reduction for health risk reasons. Patient is very well aware of how to do lifestyle intervention she has done this before.     Family History  Problem Relation Age of Onset  . Adopted: Yes  . Hypertension    . Schizophrenia Mother     with alcohol drug abuse    History   Social History  . Marital Status: Single    Spouse Name: N/A    Number of Children: N/A  . Years of Education: N/A   Social History Main Topics  . Smoking status: Never Smoker   . Smokeless tobacco: None  . Alcohol Use: No  . Drug Use: No  . Sexual Activity:    Other Topics Concern  . None   Social History Narrative   Adopted and raised by family member Sumaiya Arruda   [employer] high school  graduate [employer] second semester   Living at home with mom and has no car   Sleep 8+ hours   Seeing Dr. [name]- pulm at [facility] now has a new pulmonary Dr. Now stable and on prn       Thyroid evaluation in the past neck evaluation within normal limits and no thyroid disease.    Outpatient Encounter Prescriptions  as of 11/28/2013  Medication Sig  . albuterol (ACCUNEB) 0.63 MG/3ML nebulizer solution Please give pt 1 box.  Marland Kitchen albuterol (PROVENTIL HFA;VENTOLIN HFA) 108 (90 BASE) MCG/ACT inhaler Inhale 2 puffs into the lungs every 6 (six) hours as needed for wheezing or shortness of breath.  . beclomethasone (QVAR) 40 MCG/ACT inhaler Inhale 2 puffs into the lungs 2 (two) times daily.  . fexofenadine-pseudoephedrine (ALLEGRA-D 12 HOUR) 60-120 MG per tablet Take 1 tablet by mouth 2 (two) times daily.    . formoterol (FORADIL) 12 MCG capsule for inhaler Place 12 mcg into inhaler and inhale 2 (two) times daily.    . hydrOXYzine (ATARAX) 10 MG tablet Take 10 mg by mouth 3 (three) times daily as needed.    Lenda Kelp FE 1/20 1-20 MG-MCG tablet TAKE ONE (1) TABLET EACH DAY  . levocetirizine (XYZAL) 5 MG tablet Take 5 mg by mouth every evening.    . methylphenidate (CONCERTA) 36 MG CR tablet Take 2 once a day.  .  methylphenidate (CONCERTA) 36 MG CR tablet Take 2 once a day.  . methylphenidate (CONCERTA) 36 MG CR tablet Take 2 once a day.  . montelukast (SINGULAIR) 10 MG tablet TAKE ONE TABLET AT BEDTIME  . omeprazole (PRILOSEC) 20 MG capsule TAKE ONE (1) CAPSULE EACH DAY  . [DISCONTINUED] methylphenidate (CONCERTA) 36 MG CR tablet Take 2 once a day.  . [DISCONTINUED] methylphenidate (CONCERTA) 36 MG CR tablet Take 2 once a day.  . [DISCONTINUED] methylphenidate (CONCERTA) 36 MG CR tablet Take 2 once a day.  . [DISCONTINUED] montelukast (SINGULAIR) 10 MG tablet TAKE ONE TABLET AT BEDTIME  . azelastine (ASTELIN) 137 MCG/SPRAY nasal spray 1 spray by Nasal route 2 (two) times daily. Use in each nostril as directed   . [DISCONTINUED] amoxicillin (AMOXIL) 500 MG capsule Take 1 capsule (500 mg total) by mouth 3 (three) times daily.  . [DISCONTINUED] ondansetron (ZOFRAN) 4 MG tablet Take 1 tablet (4 mg total) by mouth every 8 (eight) hours as needed for nausea.    EXAM:  BP 126/80  Pulse 81  Temp(Src) 98.1 F (36.7 C) (Oral)  Ht 5' 5.25" (1.657 m)  Wt 230 lb (104.327 kg)  BMI 38.00 kg/m2  SpO2 98%  Body mass index is 38 kg/(m^2).  Physical Exam: Vital signs reviewed RSW:NIOE is a well-developed well-nourished alert cooperative   female who appears her stated age in no acute distress.  HEENT: normocephalic atraumatic , Eyes: PERRL EOM's full, conjunctiva clear, Nares: paten,t no deformity discharge or tenderness., Ears: no deformity EAC's clear TMs with normal landmarks.  Hearing aids Mouth: clear OP, no lesions, edema.  Moist mucous membranes. Dentition in adequate repair. NECK: supple without masses, thyromegaly or bruits. Well healed scar  Right  CHEST/PULM:  Clear to auscultation and percussion breath sounds equal no wheeze , rales or rhonchi. No chest wall deformities or tenderness. Breast: normal by inspection . No dimpling, discharge, masses, tenderness or discharge . CV: PMI is nondisplaced,  S1 S2 no gallops, murmurs, rubs. Peripheral pulses are full without delay.No JVD .  ABDOMEN: Bowel sounds normal nontender  No guard or rebound, no hepato splenomegal no CVA tenderness.  No hernia. Extremtities:  No clubbing cyanosis or edema, no acute joint swelling or redness no focal atrophy NEURO:  Oriented x3, cranial nerves 3-12 appear to be intact, no obvious focal weakness mild hard to ascertain hemiparetic  ,gait within normal limits SKIN: No acute rashes normal turgor, color, no bruising or petechiae. PSYCH: Oriented, good  eye contact, no obvious depression anxiety, cognition and judgment appear normal. LN: no cervical axillary inguinal adenopathy Pelvic: NL ext GU, labia clear without lesions or rash . Vagina no lesions .Cervix: clear  UTERUS: Neg CMT Adnexa:  clear no masses . PAP done  exam limited large abd wall.  Mucosa pink  No lesion    Wt Readings from Last 3 Encounters:  11/28/13 230 lb (104.327 kg)  09/09/13 226 lb (102.513 kg)  05/16/13 215 lb (97.523 kg)    ASSESSMENT AND PLAN:  Discussed the following assessment and plan:  Visit for preventive health examination - Plan: Basic metabolic panel, CBC with Differential, Hepatic function panel, Lipid panel, TSH, PAP [[place]]  Attention deficit disorder with hyperactivity(314.01) - continue meds  - Plan: Basic metabolic panel, CBC with Differential, Hepatic function panel, Lipid panel, TSH  Oral contraceptive pill surveillance - Plan: Basic metabolic panel, CBC with Differential, Hepatic function panel, Lipid panel, TSH  Asthma - currently stable  cld at birth - Plan: Basic metabolic panel, CBC with Differential, Hepatic function panel, Lipid panel, TSH  Weight gain - disc  monitoring healthy ;  had lost wieght in hs w lsi . consider weight watcher contact us if wishes nutrition referral   Routine gynecological examination  Encounter for routine gynecological examination - Plan: PAP [[place]] Contract to be  signed labs today   Med check 6 months or earlier if needed  Counseled regarding healthy nutrition, exercise, sleep, injury prevention, calcium vit d and healthy weight . Contact us if wishes nutrition referral Patient Care Team: Burnis Medin, MD as PCP - General Dartha Lodge as Referring Physician (Pediatrics) Derry Skill, MD (Ophthalmology) Patient Instructions  Periods can be shorter and lighter on OCPS. This is normal.  Monitor  Eating and drinking  For 2 weeks and decrease portion sizes  150 minutes of exercise weeks  ,  Lose weight  To healthy levels. Avoid trans fats and processed foods;  Increase fresh fruits and veges to 5 servings per day. And avoid sweet beverages  Including tea and juice.  Exercise to Lose Weight Exercise and a healthy diet may help you lose weight. Your doctor may suggest specific exercises. EXERCISE IDEAS AND TIPS  Choose low-cost things you enjoy doing, such as walking, bicycling, or exercising to workout videos.  Take stairs instead of the elevator.  Walk during your lunch break.  Park your car further away from work or school.  Go to a gym or an exercise class.  Start with 5 to 10 minutes of exercise each day. Build up to 30 minutes of exercise 4 to 6 days a week.  Wear shoes with good support and comfortable clothes.  Stretch before and after working out.  Work out until you breathe harder and your heart beats faster.  Drink extra water when you exercise.  Do not do so much that you hurt yourself, feel dizzy, or get very short of breath. Exercises that burn about 150 calories:  Running 1  miles in 15 minutes.  Playing volleyball for 45 to 60 minutes.  Washing and waxing a car for 45 to 60 minutes.  Playing touch football for 45 minutes.  Walking 1  miles in 35 minutes.  Pushing a stroller 1  miles in 30 minutes.  Playing basketball for 30 minutes.  Raking leaves for 30 minutes.  Bicycling 5 miles in 30  minutes.  Walking 2 miles in 30 minutes.  Dancing for 30 minutes.  Shoveling snow for 15 minutes.  Swimming laps for 20 minutes.  Walking up stairs for 15 minutes.  Bicycling 4 miles in 15 minutes.  Gardening for 30 to 45 minutes.  Jumping rope for 15 minutes.  Washing windows or floors for 45 to 60 minutes. Document Released: 11/08/2010 Document Revised: 12/29/2011 Document Reviewed: 11/08/2010 [facility] Patient Information 2014 [place], [place]. Calorie Counting Diet A calorie counting diet requires you to eat the number of calories that are right for you in a day. Calories are the measurement of how much energy you get from the food you eat. Eating the right amount of calories is important for staying at a healthy weight. If you eat too many calories, your body will store them as fat and you may gain weight. If you eat too few calories, you may lose weight. Counting the number of calories you eat during a day will help you know if you are eating the right amount. A Registered Dietitian can determine how many calories you need in a day. The amount of calories needed varies from person to person. If your goal is to lose weight, you will need to eat fewer calories. Losing weight can benefit you if you are overweight or have health problems such as heart disease, high blood pressure, or diabetes. If your goal is to gain weight, you will need to eat more calories. Gaining weight may be necessary if you have a certain health problem that causes your body to need more energy. TIPS Whether you are increasing or decreasing the number of calories you eat during a day, it may be hard to get used to changes in what you eat and drink. The following are tips to help you keep track of the number of calories you eat.  Measure foods at home with measuring cups. This helps you know the amount of food and number of calories you are eating.  Restaurants often serve food in amounts that are larger than 1  serving. While eating out, estimate how many servings of a food you are given. For example, a serving of cooked rice is  cup or about the size of half of a fist. Knowing serving sizes will help you be aware of how much food you are eating at restaurants.  Ask for smaller portion sizes or child-size portions at restaurants.  Plan to eat half of a meal at a restaurant. Take the rest home or share the other half with a friend.  Read the Nutrition Facts panel on food labels for calorie content and serving size. You can find out how many servings are in a package, the size of a serving, and the number of calories each serving has.  For example, a package might contain 3 cookies. The Nutrition Facts panel on that package says that 1 serving is 1 cookie. Below that, it will say there are 3 servings in the container. The calories section of the Nutrition Facts label says there are 90 calories. This means there are 90 calories in 1 cookie (1 serving). If you eat 1 cookie you have eaten 90 calories. If you eat all 3 cookies, you have eaten 270 calories (3 servings x 90 calories = 270 calories). The list below tells you how big or small some common portion sizes are.  1 oz.........4 stacked dice.  3 oz........Marland KitchenDeck of cards.  1 tsp.......Marland KitchenTip of little finger.  1 tbs......Marland KitchenMarland KitchenThumb.  2 tbs.......Marland KitchenGolf ball.   cup......Marland KitchenHalf of a fist.  1 cup.......Marland KitchenA fist. KEEP A  FOOD LOG Write down every food item you eat, the amount you eat, and the number of calories in each food you eat during the day. At the end of the day, you can add up the total number of calories you have eaten. It may help to keep a list like the one below. Find out the calorie information by reading the Nutrition Facts panel on food labels. Breakfast  Bran cereal (1 cup, 110 calories).  Fat-free milk ( cup, 45 calories). Snack  Apple (1 medium, 80 calories). Lunch  Spinach (1 cup, 20 calories).  Tomato ( medium, 20  calories).  Chicken breast strips (3 oz, 165 calories).  Shredded cheddar cheese ( cup, 110 calories).  Light [place] dressing (2 tbs, 60 calories).  Whole-wheat bread (1 slice, 80 calories).  Tub margarine (1 tsp, 35 calories).  Vegetable soup (1 cup, 160 calories). Dinner  Pork chop (3 oz, 190 calories).  Brown rice (1 cup, 215 calories).  Steamed broccoli ( cup, 20 calories).  Strawberries (1  cup, 65 calories).  Whipped cream (1 tbs, 50 calories). Daily Calorie Total: 2229 Document Released: 10/06/2005 Document Revised: 12/29/2011 Document Reviewed: 04/02/2007 [facility] Patient Information 2014 [place].    Preventive Care for Adults, Female A healthy lifestyle and preventive care can promote health and wellness. Preventive health guidelines for women include the following key practices.  A routine yearly physical is a good way to check with your health care provider about your health and preventive screening. It is a chance to share any concerns and updates on your health and to receive a thorough exam.  Visit your dentist for a routine exam and preventive care every 6 months. Brush your teeth twice a day and floss once a day. Good oral hygiene prevents tooth decay and gum disease.  The frequency of eye exams is based on your age, health, family medical history, use of contact lenses, and other factors. Follow your health care provider's recommendations for frequency of eye exams.  Eat a healthy diet. Foods like vegetables, fruits, whole grains, low-fat dairy products, and lean protein foods contain the nutrients you need without too many calories. Decrease your intake of foods high in solid fats, added sugars, and salt. Eat the right amount of calories for you.Get information about a proper diet from your health care provider, if necessary.  Regular physical exercise is one of the most important things you can do for your health. Most adults should get at  least 150 minutes of moderate-intensity exercise (any activity that increases your heart rate and causes you to sweat) each week. In addition, most adults need muscle-strengthening exercises on 2 or more days a week.  Maintain a healthy weight. The body mass index (BMI) is a screening tool to identify possible weight problems. It provides an estimate of body fat based on height and weight. Your health care provider can find your BMI, and can help you achieve or maintain a healthy weight.For adults 20 years and older:  A BMI below 18.5 is considered underweight.  A BMI of 18.5 to 24.9 is normal.  A BMI of 25 to 29.9 is considered overweight.  A BMI of 30 and above is considered obese.  Maintain normal blood lipids and cholesterol levels by exercising and minimizing your intake of saturated fat. Eat a balanced diet with plenty of fruit and vegetables. Blood tests for lipids and cholesterol should begin at age 102 and be repeated every 5 years. If your lipid or cholesterol levels  are high, you are over 50, or you are at high risk for heart disease, you may need your cholesterol levels checked more frequently.Ongoing high lipid and cholesterol levels should be treated with medicines if diet and exercise are not working.  If you smoke, find out from your health care provider how to quit. If you do not use tobacco, do not start.  Lung cancer screening is recommended for adults aged 62 80 years who are at high risk for developing lung cancer because of a history of smoking. A yearly low-dose CT scan of the lungs is recommended for people who have at least a 30-pack-year history of smoking and are a current smoker or have quit within the past 15 years. A pack year of smoking is smoking an average of 1 pack of cigarettes a day for 1 year (for example: 1 pack a day for 30 years or 2 packs a day for 15 years). Yearly screening should continue until the smoker has stopped smoking for at least 15 years. Yearly  screening should be stopped for people who develop a health problem that would prevent them from having lung cancer treatment.  If you are pregnant, do not drink alcohol. If you are breastfeeding, be very cautious about drinking alcohol. If you are not pregnant and choose to drink alcohol, do not have more than 1 drink per day. One drink is considered to be 12 ounces (355 mL) of beer, 5 ounces (148 mL) of wine, or 1.5 ounces (44 mL) of liquor.  Avoid use of street drugs. Do not share needles with anyone. Ask for help if you need support or instructions about stopping the use of drugs.  High blood pressure causes heart disease and increases the risk of stroke. Your blood pressure should be checked at least every 1 to 2 years. Ongoing high blood pressure should be treated with medicines if weight loss and exercise do not work.  If you are 72 22 years old, ask your health care provider if you should take aspirin to prevent strokes.  Diabetes screening involves taking a blood sample to check your fasting blood sugar level. This should be done once every 3 years, after age 22, if you are within normal weight and without risk factors for diabetes. Testing should be considered at a younger age or be carried out more frequently if you are overweight and have at least 1 risk factor for diabetes.  Breast cancer screening is essential preventive care for women. You should practice "breast self-awareness." This means understanding the normal appearance and feel of your breasts and may include breast self-examination. Any changes detected, no matter how small, should be reported to a health care provider. Women in their 12s and 30s should have a clinical breast exam (CBE) by a health care provider as part of a regular health exam every 1 to 3 years. After age 44, women should have a CBE every year. Starting at age 53, women should consider having a mammogram (breast X-ray test) every year. Women who have a family  history of breast cancer should talk to their health care provider about genetic screening. Women at a high risk of breast cancer should talk to their health care providers about having an MRI and a mammogram every year.  Breast cancer gene (BRCA)-related cancer risk assessment is recommended for women who have family members with BRCA-related cancers. BRCA-related cancers include breast, ovarian, tubal, and peritoneal cancers. Having family members with these cancers may be associated with  an increased risk for harmful changes (mutations) in the breast cancer genes BRCA1 and BRCA2. Results of the assessment will determine the need for genetic counseling and BRCA1 and BRCA2 testing.  The Pap test is a screening test for cervical cancer. A Pap test can show cell changes on the cervix that might become cervical cancer if left untreated. A Pap test is a procedure in which cells are obtained and examined from the lower end of the uterus (cervix).  Women should have a Pap test starting at age 57.  Between ages 65 and 65, Pap tests should be repeated every 2 years.  Beginning at age 58, you should have a Pap test every 3 years as long as the past 3 Pap tests have been normal.  Some women have medical problems that increase the chance of getting cervical cancer. Talk to your health care provider about these problems. It is especially important to talk to your health care provider if a new problem develops soon after your last Pap test. In these cases, your health care provider may recommend more frequent screening and Pap tests.  The above recommendations are the same for women who have or have not gotten the vaccine for human papillomavirus (HPV).  If you had a hysterectomy for a problem that was not cancer or a condition that could lead to cancer, then you no longer need Pap tests. Even if you no longer need a Pap test, a regular exam is a good idea to make sure no other problems are starting.  If you  are between ages 44 and 3 years, and you have had normal Pap tests going back 10 years, you no longer need Pap tests. Even if you no longer need a Pap test, a regular exam is a good idea to make sure no other problems are starting.  If you have had past treatment for cervical cancer or a condition that could lead to cancer, you need Pap tests and screening for cancer for at least 20 years after your treatment.  If Pap tests have been discontinued, risk factors (such as a new sexual partner) need to be reassessed to determine if screening should be resumed.  The HPV test is an additional test that may be used for cervical cancer screening. The HPV test looks for the virus that can cause the cell changes on the cervix. The cells collected during the Pap test can be tested for HPV. The HPV test could be used to screen women aged 29 years and older, and should be used in women of any age who have unclear Pap test results. After the age of 84, women should have HPV testing at the same frequency as a Pap test.  Colorectal cancer can be detected and often prevented. Most routine colorectal cancer screening begins at the age of 74 years and continues through age 29 years. However, your health care provider may recommend screening at an earlier age if you have risk factors for colon cancer. On a yearly basis, your health care provider may provide home test kits to check for hidden blood in the stool. Use of a small camera at the end of a tube, to directly examine the colon (sigmoidoscopy or colonoscopy), can detect the earliest forms of colorectal cancer. Talk to your health care provider about this at age 76, when routine screening begins. Direct exam of the colon should be repeated every 5 10 years through age 55 years, unless early forms of pre-cancerous polyps or  small growths are found.  People who are at an increased risk for hepatitis B should be screened for this virus. You are considered at high risk for  hepatitis B if:  You were born in a country where hepatitis B occurs often. Talk with your health care provider about which countries are considered high risk.  Your parents were born in a high-risk country and you have not received a shot to protect against hepatitis B (hepatitis B vaccine).  You have HIV or AIDS.  You use needles to inject street drugs.  You live with, or have sex with, someone who has Hepatitis B.  You get hemodialysis treatment.  You take certain medicines for conditions like cancer, organ transplantation, and autoimmune conditions.  Hepatitis C blood testing is recommended for all people born from [birth year] through 1964 and any individual with known risks for hepatitis C.  Practice safe sex. Use condoms and avoid high-risk sexual practices to reduce the spread of sexually transmitted infections (STIs). STIs include gonorrhea, chlamydia, syphilis, trichomonas, herpes, HPV, and human immunodeficiency virus (HIV). Herpes, HIV, and HPV are viral illnesses that have no cure. They can result in disability, cancer, and death. Sexually active women aged 4 years and younger should be checked for chlamydia. Older women with new or multiple partners should also be tested for chlamydia. Testing for other STIs is recommended if you are sexually active and at increased risk.  Osteoporosis is a disease in which the bones lose minerals and strength with aging. This can result in serious bone fractures or breaks. The risk of osteoporosis can be identified using a bone density scan. Women ages 24 years and over and women at risk for fractures or osteoporosis should discuss screening with their health care providers. Ask your health care provider whether you should take a calcium supplement or vitamin D to reduce the rate of osteoporosis.  Menopause can be associated with physical symptoms and risks. Hormone replacement therapy is available to decrease symptoms and risks. You should talk to your  health care provider about whether hormone replacement therapy is right for you.  Use sunscreen. Apply sunscreen liberally and repeatedly throughout the day. You should seek shade when your shadow is shorter than you. Protect yourself by wearing long sleeves, pants, a wide-brimmed hat, and sunglasses year round, whenever you are outdoors.  Once a month, do a whole body skin exam, using a mirror to look at the skin on your back. Tell your health care provider of new moles, moles that have irregular borders, moles that are larger than a pencil eraser, or moles that have changed in shape or color.  Stay current with required vaccines (immunizations).  Influenza vaccine. All adults should be immunized every year.  Tetanus, diphtheria, and acellular pertussis (Td, Tdap) vaccine. Pregnant women should receive 1 dose of Tdap vaccine during each pregnancy. The dose should be obtained regardless of the length of time since the last dose. Immunization is preferred during the 27th 36th week of gestation. An adult who has not previously received Tdap or who does not know her vaccine status should receive 1 dose of Tdap. This initial dose should be followed by tetanus and diphtheria toxoids (Td) booster doses every 10 years. Adults with an unknown or incomplete history of completing a 3-dose immunization series with Td-containing vaccines should begin or complete a primary immunization series including a Tdap dose. Adults should receive a Td booster every 10 years.  Varicella vaccine. An adult without evidence of immunity  to varicella should receive 2 doses or a second dose if she has previously received 1 dose. Pregnant females who do not have evidence of immunity should receive the first dose after pregnancy. This first dose should be obtained before leaving the health care facility. The second dose should be obtained 4 8 weeks after the first dose.  Human papillomavirus (HPV) vaccine. Females aged 14 26 years  who have not received the vaccine previously should obtain the 3-dose series. The vaccine is not recommended for use in pregnant females. However, pregnancy testing is not needed before receiving a dose. If a female is found to be pregnant after receiving a dose, no treatment is needed. In that case, the remaining doses should be delayed until after the pregnancy. Immunization is recommended for any person with an immunocompromised condition through the age of 46 years if she did not get any or all doses earlier. During the 3-dose series, the second dose should be obtained 4 8 weeks after the first dose. The third dose should be obtained 24 weeks after the first dose and 16 weeks after the second dose.  Zoster vaccine. One dose is recommended for adults aged 84 years or older unless certain conditions are present.  Measles, mumps, and rubella (MMR) vaccine. Adults born before [birth year] generally are considered immune to measles and mumps. Adults born in 3 or later should have 1 or more doses of MMR vaccine unless there is a contraindication to the vaccine or there is laboratory evidence of immunity to each of the three diseases. A routine second dose of MMR vaccine should be obtained at least 28 days after the first dose for students attending postsecondary schools, health care workers, or international travelers. People who received inactivated measles vaccine or an unknown type of measles vaccine during 1963 1967 should receive 2 doses of MMR vaccine. People who received inactivated mumps vaccine or an unknown type of mumps vaccine before 1979 and are at high risk for mumps infection should consider immunization with 2 doses of MMR vaccine. For females of childbearing age, rubella immunity should be determined. If there is no evidence of immunity, females who are not pregnant should be vaccinated. If there is no evidence of immunity, females who are pregnant should delay immunization until after pregnancy.  Unvaccinated health care workers born before [birth year] who lack laboratory evidence of measles, mumps, or rubella immunity or laboratory confirmation of disease should consider measles and mumps immunization with 2 doses of MMR vaccine or rubella immunization with 1 dose of MMR vaccine.  Pneumococcal 13-valent conjugate (PCV13) vaccine. When indicated, a person who is uncertain of her immunization history and has no record of immunization should receive the PCV13 vaccine. An adult aged 63 years or older who has certain medical conditions and has not been previously immunized should receive 1 dose of PCV13 vaccine. This PCV13 should be followed with a dose of pneumococcal polysaccharide (PPSV23) vaccine. The PPSV23 vaccine dose should be obtained at least 8 weeks after the dose of PCV13 vaccine. An adult aged 22 years or older who has certain medical conditions and previously received 1 or more doses of PPSV23 vaccine should receive 1 dose of PCV13. The PCV13 vaccine dose should be obtained 1 or more years after the last PPSV23 vaccine dose.  Pneumococcal polysaccharide (PPSV23) vaccine. When PCV13 is also indicated, PCV13 should be obtained first. All adults aged 30 years and older should be immunized. An adult younger than age 39 years who has certain medical  conditions should be immunized. Any person who resides in a nursing home or long-term care facility should be immunized. An adult smoker should be immunized. People with an immunocompromised condition and certain other conditions should receive both PCV13 and PPSV23 vaccines. People with human immunodeficiency virus (HIV) infection should be immunized as soon as possible after diagnosis. Immunization during chemotherapy or radiation therapy should be avoided. Routine use of PPSV23 vaccine is not recommended for American Indians, [place] Natives, or people younger than 65 years unless there are medical conditions that require PPSV23 vaccine. When indicated,  people who have unknown immunization and have no record of immunization should receive PPSV23 vaccine. One-time revaccination 5 years after the first dose of PPSV23 is recommended for people aged 71 64 years who have chronic kidney failure, nephrotic syndrome, asplenia, or immunocompromised conditions. People who received 1 2 doses of PPSV23 before age 61 years should receive another dose of PPSV23 vaccine at age 71 years or later if at least 5 years have passed since the previous dose. Doses of PPSV23 are not needed for people immunized with PPSV23 at or after age 4 years.  Meningococcal vaccine. Adults with asplenia or persistent complement component deficiencies should receive 2 doses of quadrivalent meningococcal conjugate (MenACWY-D) vaccine. The doses should be obtained at least 2 months apart. Microbiologists working with certain meningococcal bacteria, [place] recruits, people at risk during an outbreak, and people who travel to or live in countries with a high rate of meningitis should be immunized. A first-year college student up through age 34 years who is living in a residence hall should receive a dose if she did not receive a dose on or after her 16th birthday. Adults who have certain high-risk conditions should receive one or more doses of vaccine.  Hepatitis A vaccine. Adults who wish to be protected from this disease, have certain high-risk conditions, work with hepatitis A-infected animals, work in hepatitis A research labs, or travel to or work in countries with a high rate of hepatitis A should be immunized. Adults who were previously unvaccinated and who anticipate close contact with an international adoptee during the first 60 days after arrival in the [place] from a country with a high rate of hepatitis A should be immunized.  Hepatitis B vaccine. Adults who wish to be protected from this disease, have certain high-risk conditions, may be exposed to blood or other infectious body  fluids, are household contacts or sex partners of hepatitis B positive people, are clients or workers in certain care facilities, or travel to or work in countries with a high rate of hepatitis B should be immunized.  Haemophilus influenzae type b (Hib) vaccine. A previously unvaccinated person with asplenia or sickle cell disease or having a scheduled splenectomy should receive 1 dose of Hib vaccine. Regardless of previous immunization, a recipient of a hematopoietic stem cell transplant should receive a 3-dose series 6 12 months after her successful transplant. Hib vaccine is not recommended for adults with HIV infection. Preventive Services / Frequency Ages 41 to 39years  Blood pressure check.** / Every 1 to 2 years.  Lipid and cholesterol check.** / Every 5 years beginning at age 38.  Clinical breast exam.** / Every 3 years for women in their 15s and 62s.  BRCA-related cancer risk assessment.** / For women who have family members with a BRCA-related cancer (breast, ovarian, tubal, or peritoneal cancers).  Pap test.** / Every 2 years from ages 67 through 10. Every 3 years starting at age  67 through age 71 or 63 with a history of 3 consecutive normal Pap tests.  HPV screening.** / Every 3 years from ages 34 through ages 10 to 49 with a history of 3 consecutive normal Pap tests.  Hepatitis C blood test.** / For any individual with known risks for hepatitis C.  Skin self-exam. / Monthly.  Influenza vaccine. / Every year.  Tetanus, diphtheria, and acellular pertussis (Tdap, Td) vaccine.** / Consult your health care provider. Pregnant women should receive 1 dose of Tdap vaccine during each pregnancy. 1 dose of Td every 10 years.  Varicella vaccine.** / Consult your health care provider. Pregnant females who do not have evidence of immunity should receive the first dose after pregnancy.  HPV vaccine. / 3 doses over 6 months, if 43 and younger. The vaccine is not recommended for use in  pregnant females. However, pregnancy testing is not needed before receiving a dose.  Measles, mumps, rubella (MMR) vaccine.** / You need at least 1 dose of MMR if you were born in 1957 or later. You may also need a 2nd dose. For females of childbearing age, rubella immunity should be determined. If there is no evidence of immunity, females who are not pregnant should be vaccinated. If there is no evidence of immunity, females who are pregnant should delay immunization until after pregnancy.  Pneumococcal 13-valent conjugate (PCV13) vaccine.** / Consult your health care provider.  Pneumococcal polysaccharide (PPSV23) vaccine.** / 1 to 2 doses if you smoke cigarettes or if you have certain conditions.  Meningococcal vaccine.** / 1 dose if you are age 17 to 36 years and a Market researcher living in a residence hall, or have one of several medical conditions, you need to get vaccinated against meningococcal disease. You may also need additional booster doses.  Hepatitis A vaccine.** / Consult your health care provider.  Hepatitis B vaccine.** / Consult your health care provider.  Haemophilus influenzae type b (Hib) vaccine.** / Consult your health care provider.     Standley Brooking. Marion Seese M.D.  Pre visit review using our clinic review tool, if applicable. No additional management support is needed unless otherwise documented below in the visit note.

## 2013-12-02 ENCOUNTER — Encounter: Payer: Self-pay | Admitting: Internal Medicine

## 2013-12-02 DIAGNOSIS — R87612 Low grade squamous intraepithelial lesion on cytologic smear of cervix (LGSIL): Secondary | ICD-10-CM | POA: Insufficient documentation

## 2013-12-08 ENCOUNTER — Encounter: Payer: Self-pay | Admitting: Family Medicine

## 2014-01-23 LAB — PULMONARY FUNCTION TEST

## 2014-02-12 ENCOUNTER — Other Ambulatory Visit: Payer: Self-pay | Admitting: Internal Medicine

## 2014-03-12 ENCOUNTER — Emergency Department (HOSPITAL_COMMUNITY): Payer: Federal, State, Local not specified - PPO

## 2014-03-12 ENCOUNTER — Encounter (HOSPITAL_COMMUNITY): Payer: Self-pay | Admitting: Emergency Medicine

## 2014-03-12 ENCOUNTER — Emergency Department (HOSPITAL_COMMUNITY)
Admission: EM | Admit: 2014-03-12 | Discharge: 2014-03-12 | Disposition: A | Discharge status: Home / Self Care | Payer: Federal, State, Local not specified - PPO | Attending: Emergency Medicine | Admitting: Emergency Medicine

## 2014-03-12 DIAGNOSIS — Z8669 Personal history of other diseases of the nervous system and sense organs: Secondary | ICD-10-CM | POA: Insufficient documentation

## 2014-03-12 DIAGNOSIS — Z79899 Other long term (current) drug therapy: Secondary | ICD-10-CM | POA: Insufficient documentation

## 2014-03-12 DIAGNOSIS — Z8768 Personal history of other (corrected) conditions arising in the perinatal period: Secondary | ICD-10-CM | POA: Insufficient documentation

## 2014-03-12 DIAGNOSIS — Z8742 Personal history of other diseases of the female genital tract: Secondary | ICD-10-CM | POA: Insufficient documentation

## 2014-03-12 DIAGNOSIS — R079 Chest pain, unspecified: Secondary | ICD-10-CM

## 2014-03-12 DIAGNOSIS — Z87898 Personal history of other specified conditions: Secondary | ICD-10-CM | POA: Insufficient documentation

## 2014-03-12 DIAGNOSIS — F909 Attention-deficit hyperactivity disorder, unspecified type: Secondary | ICD-10-CM | POA: Insufficient documentation

## 2014-03-12 DIAGNOSIS — J45901 Unspecified asthma with (acute) exacerbation: Secondary | ICD-10-CM | POA: Insufficient documentation

## 2014-03-12 LAB — CBC WITH DIFFERENTIAL/PLATELET
Basophils Absolute: 0 10*3/uL (ref 0.0–0.1)
Basophils Relative: 0 % (ref 0–1)
Eosinophils Absolute: 0.1 10*3/uL (ref 0.0–0.7)
Eosinophils Relative: 2 % (ref 0–5)
HCT: 43.3 % (ref 36.0–46.0)
Hemoglobin: 13.9 g/dL (ref 12.0–15.0)
Lymphocytes Relative: 46 % (ref 12–46)
Lymphs Abs: 3.4 10*3/uL (ref 0.7–4.0)
MCH: 24.1 pg — ABNORMAL LOW (ref 26.0–34.0)
MCHC: 32.1 g/dL (ref 30.0–36.0)
MCV: 75 fL — ABNORMAL LOW (ref 78.0–100.0)
Monocytes Absolute: 0.3 10*3/uL (ref 0.1–1.0)
Monocytes Relative: 5 % (ref 3–12)
Neutro Abs: 3.4 10*3/uL (ref 1.7–7.7)
Neutrophils Relative %: 47 % (ref 43–77)
Platelets: 332 10*3/uL (ref 150–400)
RBC: 5.77 MIL/uL — ABNORMAL HIGH (ref 3.87–5.11)
RDW: 14.5 % (ref 11.5–15.5)
WBC: 7.2 10*3/uL (ref 4.0–10.5)

## 2014-03-12 LAB — I-STAT TROPONIN, ED: Troponin i, poc: 0 ng/mL (ref 0.00–0.08)

## 2014-03-12 LAB — I-STAT CHEM 8, ED
BUN: 10 mg/dL (ref 6–23)
Calcium, Ion: 1.15 mmol/L (ref 1.12–1.23)
Chloride: 103 mEq/L (ref 96–112)
Creatinine, Ser: 0.9 mg/dL (ref 0.50–1.10)
Glucose, Bld: 94 mg/dL (ref 70–99)
HCT: 45 % (ref 36.0–46.0)
Hemoglobin: 15.3 g/dL — ABNORMAL HIGH (ref 12.0–15.0)
Potassium: 3.4 mEq/L — ABNORMAL LOW (ref 3.7–5.3)
Sodium: 140 mEq/L (ref 137–147)
TCO2: 25 mmol/L (ref 0–100)

## 2014-03-12 LAB — RAPID STREP SCREEN (MED CTR MEBANE ONLY): Streptococcus, Group A Screen (Direct): NEGATIVE

## 2014-03-12 MED ORDER — PREDNISONE 20 MG PO TABS: 40.0000 mg | ORAL_TABLET | Freq: Every day | ORAL | Status: DC

## 2014-03-12 MED ORDER — ALBUTEROL SULFATE (2.5 MG/3ML) 0.083% IN NEBU
5.0000 mg | INHALATION_SOLUTION | Freq: Once | RESPIRATORY_TRACT | Status: AC
  Administered 2014-03-12: 5 mg via RESPIRATORY_TRACT
  Filled 2014-03-12: qty 6

## 2014-03-12 MED ORDER — IPRATROPIUM BROMIDE 0.02 % IN SOLN
0.5000 mg | Freq: Once | RESPIRATORY_TRACT | Status: AC
  Administered 2014-03-12: 0.5 mg via RESPIRATORY_TRACT
  Filled 2014-03-12: qty 2.5

## 2014-03-12 MED ORDER — ALBUTEROL SULFATE (2.5 MG/3ML) 0.083% IN NEBU: 2.5000 mg | INHALATION_SOLUTION | RESPIRATORY_TRACT | Status: DC | PRN

## 2014-03-12 MED ORDER — PREDNISONE 20 MG PO TABS
60.0000 mg | ORAL_TABLET | Freq: Once | ORAL | Status: AC
  Administered 2014-03-12: 60 mg via ORAL
  Filled 2014-03-12: qty 3

## 2014-03-12 NOTE — Discharge Instructions (Signed)
Continue treatments every 4 hrs for the next 3 days. Prednisone as prescribed until all gone. Follow up with primary care doctor in 2 days.  Return if worsening.     Asthma, Acute Bronchospasm Acute bronchospasm caused by asthma is also referred to as an asthma attack. Bronchospasm means your air passages become narrowed. The narrowing is caused by inflammation and tightening of the muscles in the air tubes (bronchi) in your lungs. This can make it hard to breath or cause you to wheeze and cough. CAUSES Possible triggers are:  Animal dander from the skin, hair, or feathers of animals.  Dust mites contained in house dust.  Cockroaches.  Pollen from trees or grass.  Mold.  Cigarette or tobacco smoke.  Air pollutants such as dust, household cleaners, hair sprays, aerosol sprays, paint fumes, strong chemicals, or strong odors.  Cold air or weather changes. Cold air may trigger inflammation. Winds increase molds and pollens in the air.  Strong emotions such as crying or laughing hard.  Stress.  Certain medicines such as aspirin or beta-blockers.  Sulfites in foods and drinks, such as dried fruits and wine.  Infections or inflammatory conditions, such as a flu, cold, or inflammation of the nasal membranes (rhinitis).  Gastroesophageal reflux disease (GERD). GERD is a condition where stomach acid backs up into your throat (esophagus).  Exercise or strenuous activity. SIGNS AND SYMPTOMS   Wheezing.  Excessive coughing, particularly at night.  Chest tightness.  Shortness of breath. DIAGNOSIS  Your health care provider will ask you about your medical history and perform a physical exam. A chest X-ray or blood testing may be performed to look for other causes of your symptoms or other conditions that may have triggered your asthma attack. TREATMENT  Treatment is aimed at reducing inflammation and opening up the airways in your lungs. Most asthma attacks are treated with  inhaled medicines. These include quick relief or rescue medicines (such as bronchodilators) and controller medicines (such as inhaled corticosteroids). These medicines are sometimes given through an inhaler or a nebulizer. Systemic steroid medicine taken by mouth or given through an IV tube also can be used to reduce the inflammation when an attack is moderate or severe. Antibiotic medicines are only used if a bacterial infection is present.  HOME CARE INSTRUCTIONS   Rest.  Drink plenty of liquids. This helps the mucus to remain thin and be easily coughed up. Only use caffeine in moderation and do not use alcohol until you have recovered from your illness.  Do not smoke. Avoid being exposed to secondhand smoke.  You play a critical role in keeping yourself in good health. Avoid exposure to things that cause you to wheeze or to have breathing problems.  Keep your medicines up to date and available. Carefully follow your health care provider's treatment plan.  Take your medicine exactly as prescribed.  When pollen or pollution is bad, keep windows closed and use an air conditioner or go to places with air conditioning.  Asthma requires careful medical care. See your health care provider for a follow-up as advised. If you are more than [redacted] weeks pregnant and you were prescribed any new medicines, let your obstetrician know about the visit and how you are doing. Follow-up with your health care provider as directed.  After you have recovered from your asthma attack, make an appointment with your outpatient doctor to talk about ways to reduce the likelihood of future attacks. If you do not have a doctor who manages  your asthma, make an appointment with a primary care doctor to discuss your asthma. SEEK IMMEDIATE MEDICAL CARE IF:   You are getting worse.  You have trouble breathing. If severe, call your local emergency services (911 in the [place]  You develop chest pain or discomfort.  You are  vomiting.  You are not able to keep fluids down.  You are coughing up yellow, green, brown, or bloody sputum.  You have a fever and your symptoms suddenly get worse.  You have trouble swallowing. MAKE SURE YOU:   Understand these instructions.  Will watch your condition.  Will get help right away if you are not doing well or get worse. Document Released: 01/21/2007 Document Revised: 06/08/2013 Document Reviewed: 04/13/2013 [facility] Patient Information 2014 [place], [place].

## 2014-03-12 NOTE — ED Notes (Signed)
Pt reports mid upper chest pain since 0300. Having nausea and sob. Denies cough. Airway intact, ekg done at triage.

## 2014-03-12 NOTE — ED Provider Notes (Signed)
CSN: [id number]     Arrival date & time 03/12/14  4098 History   First MD Initiated Contact with Patient 03/12/14 [phone]     Chief Complaint  Patient presents with  . Chest Pain     (Consider location/radiation/quality/duration/timing/severity/associated sxs/prior Treatment) HPI Glenda Mendez is a 23 y.o. female with history of asthma, ecmo at birth due to pulmonary hemorrhage and hypoxia, presents to ED with complaint of chest tightening, sore throat. Pt states she woke up at 3am with pain and tightness in upper center chest. States she had one treatment left of albuterol, states she used it and her tightness improved. States she was able to sleep the rest of the night. Reports this morning tightness returned, and now having sore throat. Mother brought pt to ED. Pt admits to sneezing. Denies fever, chills, nasal congestion, cough. States has had similar episodes in the past, states was seen by PCP, diagnosed with asthma and uri. States PCP's office is closed today.   Past Medical History  Diagnosis Date  . Asthma   . Pulmonary hemorrhage of fetus or newborn     37 week C-section [facility] of with birth hypoxia  . Hypoxia of newborn   . Hearing loss in right ear     with hearing aid bilateral   . Hemiparesis     rt from neonatal period  . Allergy   . ADHD (attention deficit hyperactivity disorder)   . Irregular periods 06/04/2011  . BMI (body mass index), pediatric, 95-99% for age 25/10/2010    Is starting to gain weight again and advised and counseled today about reduction for health risk reasons. Patient is very well aware of how to do lifestyle intervention she has done this before.    History reviewed. No pertinent past surgical history. Family History  Problem Relation Age of Onset  . Adopted: Yes  . Hypertension    . Schizophrenia Mother     with alcohol drug abuse   History  Substance Use Topics  . Smoking status: Never Smoker   . Smokeless tobacco: Not on file  . Alcohol  Use: No   OB History   Grav Para Term Preterm Abortions TAB SAB Ect Mult Living                 Review of Systems  Constitutional: Negative for fever and chills.  HENT: Positive for sore throat. Negative for congestion, ear pain and sinus pressure.   Respiratory: Positive for chest tightness and shortness of breath. Negative for cough, wheezing and stridor.   Cardiovascular: Positive for chest pain. Negative for palpitations and leg swelling.  Gastrointestinal: Negative for nausea, vomiting, abdominal pain and diarrhea.  Genitourinary: Negative for dysuria and flank pain.  Musculoskeletal: Negative for arthralgias, myalgias, neck pain and neck stiffness.  Skin: Negative for rash.  Neurological: Negative for dizziness, weakness and headaches.  All other systems reviewed and are negative.     Allergies  Review of patient's allergies indicates no known allergies.  Home Medications   Prior to Admission medications   Medication Sig Start Date End Date Taking? Authorizing Provider  albuterol (ACCUNEB) 0.63 MG/3ML nebulizer solution Take 1 ampule by nebulization 3 (three) times daily as needed for wheezing. Please give pt 1 box. 11/08/12  Yes Lucretia Kern, DO  albuterol (PROVENTIL HFA;VENTOLIN HFA) 108 (90 BASE) MCG/ACT inhaler Inhale 2 puffs into the lungs every 6 (six) hours as needed for wheezing or shortness of breath. 09/09/13  Yes Burnis Medin,  MD  azelastine (ASTELIN) 137 MCG/SPRAY nasal spray 1 spray by Nasal route 2 (two) times daily. Use in each nostril as directed    Yes Historical Provider, MD  fexofenadine-pseudoephedrine (ALLEGRA-D 12 HOUR) 60-120 MG per tablet Take 1 tablet by mouth 2 (two) times daily as needed (for allergies).    Yes Historical Provider, MD  hydrOXYzine (ATARAX) 10 MG tablet Take 10 mg by mouth 3 (three) times daily as needed for itching.    Yes Historical Provider, MD  methylphenidate (CONCERTA) 36 MG CR tablet Take 2 once a day. 11/28/13  Yes Burnis Medin, MD  montelukast (SINGULAIR) 10 MG tablet Take 10 mg by mouth at bedtime. 11/28/13  Yes Burnis Medin, MD  norethindrone-ethinyl estradiol (JUNEL FE,GILDESS FE,LOESTRIN FE) 1-20 MG-MCG tablet Take 1 tablet by mouth daily.   Yes Historical Provider, MD  omeprazole (PRILOSEC) 20 MG capsule Take 20 mg by mouth daily.   Yes Historical Provider, MD   BP 125/60  Pulse 76  Temp(Src) 98 F (36.7 C) (Oral)  Resp 20  SpO2 99%  LMP 03/01/2014 Physical Exam  Nursing note and vitals reviewed. Constitutional: She is oriented to person, place, and time. She appears well-developed and well-nourished. No distress.  HENT:  Head: Normocephalic.  Right Ear: External ear normal.  Left Ear: External ear normal.  Nose: Nose normal.  Mouth/Throat: Oropharynx is clear and moist. No oropharyngeal exudate.  Eyes: Conjunctivae are normal.  Neck: Neck supple.  Cardiovascular: Normal rate, regular rhythm and normal heart sounds.   Pulmonary/Chest: Effort normal and breath sounds normal. No respiratory distress. She has no wheezes. She has no rales. She exhibits no tenderness.  Abdominal: Soft. Bowel sounds are normal. She exhibits no distension. There is no tenderness. There is no rebound.  Musculoskeletal: She exhibits no edema.  Neurological: She is alert and oriented to person, place, and time.  Skin: Skin is warm and dry.  Psychiatric: She has a normal mood and affect. Her behavior is normal.    ED Course  Procedures (including critical care time) Labs Review Labs Reviewed  CBC WITH DIFFERENTIAL - Abnormal; Notable for the following:    RBC 5.77 (*)    MCV 75.0 (*)    MCH 24.1 (*)    All other components within normal limits  I-STAT CHEM 8, ED - Abnormal; Notable for the following:    Potassium 3.4 (*)    Hemoglobin 15.3 (*)    All other components within normal limits  RAPID STREP SCREEN  CULTURE, GROUP A STREP  I-STAT TROPOININ, ED    Imaging Review Dg Chest 2 View  03/12/2014    CLINICAL DATA:  Chest pain.  EXAM: CHEST  2 VIEW  COMPARISON:  None.  FINDINGS: The heart size and mediastinal contours are within normal limits. Both lungs are clear. No pneumothorax or pleural effusion is noted. The visualized skeletal structures are unremarkable.  IMPRESSION: No acute cardiopulmonary abnormality seen.   Electronically Signed   By: Sabino Dick M.D.   On: 03/12/2014 10:55     EKG Interpretation   Date/Time:  Sunday Mar 12 2014 09:31:37 EDT Ventricular Rate:  81 PR Interval:  118 QRS Duration: 86 QT Interval:  376 QTC Calculation: 436 R Axis:   87 Text Interpretation:  Normal sinus rhythm Nonspecific T wave abnormality  Abnormal ECG No previous ECGs available Confirmed by YAO  MD, DAVID  ([id number]) on 03/12/2014 9:41:06 AM      MDM   Final diagnoses:  Chest pain  Asthma exacerbation   Pt with hx of asthma, presents with upper chest tightness. Pt is not wheezing but states this feels like her asthma. Will try a neb, prednisone ordered. CXR, ecg, labs ordered. Pt is PERC negative. Normal VS. Doubt CAD given age and no risk factors.    Labs, CXR, ECG unremarkable. Pt feeling much better just after one neb and prednisone. Pt is hungry and wants to go home.  Home with albuterol and prednisone burst. Follow up with PCP.   Filed Vitals:   03/12/14 0933 03/12/14 1129 03/12/14 1232  BP: 125/60 135/63 119/62  Pulse: 76 92 81  Temp: 98 F (36.7 C)  97.8 F (36.6 C)  TempSrc: Oral Oral Oral  Resp: 20 16 20   SpO2: 99% 100% 100%       Renold Genta, PA-C 03/12/14 1611

## 2014-03-13 NOTE — ED Provider Notes (Signed)
Medical screening examination/treatment/procedure(s) were performed by non-physician practitioner and as supervising physician I was immediately available for consultation/collaboration.   EKG Interpretation   Date/Time:  Sunday Mar 12 2014 09:31:37 EDT Ventricular Rate:  81 PR Interval:  118 QRS Duration: 86 QT Interval:  376 QTC Calculation: 436 R Axis:   87 Text Interpretation:  Normal sinus rhythm Nonspecific T wave abnormality  Abnormal ECG No previous ECGs available Confirmed by Karolyne Timmons  MD, Marialy Urbanczyk  ([id number]) on 03/12/2014 9:41:06 AM        Wandra Arthurs, MD 03/13/14 1505

## 2014-03-14 LAB — CULTURE, GROUP A STREP

## 2014-04-30 ENCOUNTER — Emergency Department (HOSPITAL_COMMUNITY): Payer: Federal, State, Local not specified - PPO

## 2014-04-30 ENCOUNTER — Emergency Department (HOSPITAL_COMMUNITY)
Admission: EM | Admit: 2014-04-30 | Discharge: 2014-04-30 | Disposition: A | Discharge status: Home / Self Care | Payer: Federal, State, Local not specified - PPO | Attending: Emergency Medicine | Admitting: Emergency Medicine

## 2014-04-30 ENCOUNTER — Encounter (HOSPITAL_COMMUNITY): Payer: Self-pay | Admitting: Emergency Medicine

## 2014-04-30 DIAGNOSIS — J45901 Unspecified asthma with (acute) exacerbation: Secondary | ICD-10-CM | POA: Insufficient documentation

## 2014-04-30 DIAGNOSIS — Z79899 Other long term (current) drug therapy: Secondary | ICD-10-CM | POA: Insufficient documentation

## 2014-04-30 DIAGNOSIS — F909 Attention-deficit hyperactivity disorder, unspecified type: Secondary | ICD-10-CM | POA: Insufficient documentation

## 2014-04-30 DIAGNOSIS — H9190 Unspecified hearing loss, unspecified ear: Secondary | ICD-10-CM | POA: Insufficient documentation

## 2014-04-30 DIAGNOSIS — Z3202 Encounter for pregnancy test, result negative: Secondary | ICD-10-CM | POA: Insufficient documentation

## 2014-04-30 DIAGNOSIS — Z8742 Personal history of other diseases of the female genital tract: Secondary | ICD-10-CM | POA: Insufficient documentation

## 2014-04-30 LAB — COMPREHENSIVE METABOLIC PANEL
ALT: 32 U/L (ref 0–35)
AST: 21 U/L (ref 0–37)
Albumin: 3.6 g/dL (ref 3.5–5.2)
Alkaline Phosphatase: 116 U/L (ref 39–117)
Anion gap: 16 — ABNORMAL HIGH (ref 5–15)
BUN: 8 mg/dL (ref 6–23)
CO2: 24 mEq/L (ref 19–32)
Calcium: 8.8 mg/dL (ref 8.4–10.5)
Chloride: 101 mEq/L (ref 96–112)
Creatinine, Ser: 0.79 mg/dL (ref 0.50–1.10)
GFR calc Af Amer: >90 mL/min (ref >90)
GFR calc non Af Amer: >90 mL/min (ref >90)
Glucose, Bld: 128 mg/dL — ABNORMAL HIGH (ref 70–99)
Potassium: 3.6 mEq/L — ABNORMAL LOW (ref 3.7–5.3)
Sodium: 141 mEq/L (ref 137–147)
Total Bilirubin: <0.2 mg/dL — ABNORMAL LOW (ref 0.3–1.2)
Total Protein: 7.3 g/dL (ref 6.0–8.3)

## 2014-04-30 LAB — CBC WITH DIFFERENTIAL/PLATELET
Basophils Absolute: 0 10*3/uL (ref 0.0–0.1)
Basophils Relative: 0 % (ref 0–1)
Eosinophils Absolute: 0.2 10*3/uL (ref 0.0–0.7)
Eosinophils Relative: 2 % (ref 0–5)
HCT: 41.1 % (ref 36.0–46.0)
Hemoglobin: 13.1 g/dL (ref 12.0–15.0)
Lymphocytes Relative: 41 % (ref 12–46)
Lymphs Abs: 2.9 10*3/uL (ref 0.7–4.0)
MCH: 24.2 pg — ABNORMAL LOW (ref 26.0–34.0)
MCHC: 31.9 g/dL (ref 30.0–36.0)
MCV: 75.8 fL — ABNORMAL LOW (ref 78.0–100.0)
Monocytes Absolute: 0.3 10*3/uL (ref 0.1–1.0)
Monocytes Relative: 4 % (ref 3–12)
Neutro Abs: 3.7 10*3/uL (ref 1.7–7.7)
Neutrophils Relative %: 53 % (ref 43–77)
Platelets: 292 10*3/uL (ref 150–400)
RBC: 5.42 MIL/uL — ABNORMAL HIGH (ref 3.87–5.11)
RDW: 14.8 % (ref 11.5–15.5)
WBC: 7.1 10*3/uL (ref 4.0–10.5)

## 2014-04-30 LAB — POC URINE PREG, ED: Preg Test, Ur: NEGATIVE

## 2014-04-30 LAB — D-DIMER, QUANTITATIVE (NOT AT ARMC): D-Dimer, Quant: 0.3 ug/mL-FEU (ref 0.00–0.48)

## 2014-04-30 LAB — TROPONIN I: Troponin I: <0.3 ng/mL (ref <0.30)

## 2014-04-30 MED ORDER — PREDNISONE 20 MG PO TABS: ORAL_TABLET | ORAL | Status: DC

## 2014-04-30 MED ORDER — SODIUM CHLORIDE 0.9 % IV BOLUS (SEPSIS)
500.0000 mL | Freq: Once | INTRAVENOUS | Status: AC
  Administered 2014-04-30: 500 mL via INTRAVENOUS

## 2014-04-30 MED ORDER — ALBUTEROL SULFATE HFA 108 (90 BASE) MCG/ACT IN AERS
2.0000 | INHALATION_SPRAY | Freq: Once | RESPIRATORY_TRACT | Status: AC
  Administered 2014-04-30: 2 via RESPIRATORY_TRACT
  Filled 2014-04-30: qty 6.7

## 2014-04-30 MED ORDER — PREDNISONE 20 MG PO TABS
60.0000 mg | ORAL_TABLET | Freq: Once | ORAL | Status: AC
  Administered 2014-04-30: 60 mg via ORAL
  Filled 2014-04-30: qty 3

## 2014-04-30 MED ORDER — ALBUTEROL SULFATE (2.5 MG/3ML) 0.083% IN NEBU
5.0000 mg | INHALATION_SOLUTION | Freq: Once | RESPIRATORY_TRACT | Status: AC
  Administered 2014-04-30: 5 mg via RESPIRATORY_TRACT
  Filled 2014-04-30: qty 6

## 2014-04-30 NOTE — ED Notes (Signed)
Marissa,PA at the bedside.  

## 2014-04-30 NOTE — ED Notes (Signed)
Glenda Mendez at bedside

## 2014-04-30 NOTE — ED Provider Notes (Signed)
CSN: [id number]     Arrival date & time 04/30/14  1235 History   First MD Initiated Contact with Patient 04/30/14 1235     Chief Complaint  Patient presents with  . Asthma  . Shortness of Breath     (Consider location/radiation/quality/duration/timing/severity/associated sxs/prior Treatment) The history is provided by the patient. No language interpreter was used.  Glenda Mendez is a 23 y/o F with PMHx of asthma, hypoxia as a newborn secondary to pulmonary hemorrhage, ADHD presenting to the ED with an asthma attack that started this morning at approximately 8:00AM. As per patient reported that she started to have chest tightness to the center of her chest and reported that shortly after she started to have shortness of breath. Stated that it was difficult for her to take a deep breath in. Reported that she took 2 treatments at home of albuterol. Mother reported that she continued to have difficulty breathing and she called the EMS. En route EMS gave the patient 10 mg albuterol and 1 mg atrovent - patient tolerated medication well and wheezing resolved. Patient reported that she is started to feel a lot better. Reported that she has been having nasal congestion and right ear pain that started a couple of days ago. Mother at bedside reported that they were out in [place] on Friday and the temperature was over 100F - mother believes this is what led to the asthma attack. Denied difficulty breathing, abdominal pain, leg swelling, long travels, hemostasis, weakness, nausea, vomiting, diarrhea, melena, chills, fever. PCP Dr. Regis Bill  Past Medical History  Diagnosis Date  . Asthma   . Pulmonary hemorrhage of fetus or newborn     73 week C-section [facility] of with birth hypoxia  . Hypoxia of newborn   . Hearing loss in right ear     with hearing aid bilateral   . Hemiparesis     rt from neonatal period  . Allergy   . ADHD (attention deficit hyperactivity disorder)   . Irregular periods 06/04/2011   . BMI (body mass index), pediatric, 95-99% for age 06/21/2011    Is starting to gain weight again and advised and counseled today about reduction for health risk reasons. Patient is very well aware of how to do lifestyle intervention she has done this before.    History reviewed. No pertinent past surgical history. Family History  Problem Relation Age of Onset  . Adopted: Yes  . Hypertension    . Schizophrenia Mother     with alcohol drug abuse   History  Substance Use Topics  . Smoking status: Never Smoker   . Smokeless tobacco: Not on file  . Alcohol Use: No   OB History   Grav Para Term Preterm Abortions TAB SAB Ect Mult Living                 Review of Systems  Constitutional: Negative for fever and chills.  Respiratory: Positive for shortness of breath and wheezing. Negative for chest tightness.   Cardiovascular: Negative for chest pain.  Gastrointestinal: Negative for nausea, vomiting and abdominal pain.  Neurological: Negative for dizziness, weakness and numbness.      Allergies  Review of patient's allergies indicates no known allergies.  Home Medications   Prior to Admission medications   Medication Sig Start Date End Date Taking? Authorizing Provider  albuterol (PROVENTIL HFA;VENTOLIN HFA) 108 (90 BASE) MCG/ACT inhaler Inhale 2 puffs into the lungs every 6 (six) hours as needed for wheezing or shortness of  breath. 09/09/13  Yes Burnis Medin, MD  albuterol (PROVENTIL) (2.5 MG/3ML) 0.083% nebulizer solution Take 3 mLs (2.5 mg total) by nebulization every 4 (four) hours as needed for wheezing or shortness of breath. 03/12/14  Yes Tatyana A Kirichenko, PA-C  azelastine (ASTELIN) 137 MCG/SPRAY nasal spray 1 spray by Nasal route 2 (two) times daily. Use in each nostril as directed    Yes Historical Provider, MD  hydrOXYzine (ATARAX) 10 MG tablet Take 10 mg by mouth 3 (three) times daily as needed for itching.    Yes Historical Provider, MD  ibuprofen (ADVIL,MOTRIN) 200  MG tablet Take 200 mg by mouth every 6 (six) hours as needed for headache.   Yes Historical Provider, MD  methylphenidate 36 MG PO CR tablet Take 72 mg by mouth daily.   Yes Historical Provider, MD  montelukast (SINGULAIR) 10 MG tablet Take 10 mg by mouth at bedtime. 11/28/13  Yes Burnis Medin, MD  norethindrone-ethinyl estradiol (JUNEL FE,GILDESS FE,LOESTRIN FE) 1-20 MG-MCG tablet Take 1 tablet by mouth daily.   Yes Historical Provider, MD  omeprazole (PRILOSEC) 20 MG capsule Take 20 mg by mouth daily.   Yes Historical Provider, MD   BP 110/40  Pulse 74  Temp(Src) 98.4 F (36.9 C) (Oral)  Resp 20  Ht 5' 6.5" (1.689 m)  Wt 240 lb (108.863 kg)  BMI 38.16 kg/m2  SpO2 100%  LMP 04/16/2014 Physical Exam  Nursing note and vitals reviewed. Constitutional: She is oriented to person, place, and time. She appears well-developed and well-nourished. No distress.  HENT:  Head: Normocephalic and atraumatic.  Mouth/Throat: Oropharynx is clear and moist. No oropharyngeal exudate.  Eyes: Conjunctivae and EOM are normal. Pupils are equal, round, and reactive to light. Right eye exhibits no discharge. Left eye exhibits no discharge.  Neck: Normal range of motion. Neck supple. No tracheal deviation present.  Negative neck stiffness Negative nuchal rigidity  Negative cervical lymphadenopathy   Cardiovascular: Normal rate, regular rhythm and normal heart sounds.  Exam reveals no friction rub.   No murmur heard. Mild tachycardia Negative swelling or pitting edema noted to the lower extremities bilaterally   Pulmonary/Chest: Effort normal and breath sounds normal. No respiratory distress. She has no wheezes. She has no rales. She exhibits tenderness.  Patient is able to speak in full sentences without difficulty  Negative use of accessory muscles Negative stridor  Mild decreased breath sounds to upper and lower lobes bilaterally. Negative expiratory and inspiratory wheezes bilaterally.    Musculoskeletal: Normal range of motion.  Full ROM to upper and lower extremities without difficulty noted, negative ataxia noted.  Lymphadenopathy:    She has no cervical adenopathy.  Neurological: She is alert and oriented to person, place, and time. No cranial nerve deficit. She exhibits normal muscle tone. Coordination normal.  Skin: Skin is warm and dry. No rash noted. She is not diaphoretic. No erythema.  Psychiatric: She has a normal mood and affect. Her behavior is normal. Thought content normal.    ED Course  Procedures (including critical care time)  Results for orders placed during the hospital encounter of 04/30/14  CBC WITH DIFFERENTIAL      Result Value Ref Range   WBC 7.1  4.0 - 10.5 K/uL   RBC 5.42 (*) 3.87 - 5.11 MIL/uL   Hemoglobin 13.1  12.0 - 15.0 g/dL   HCT 41.1  36.0 - 46.0 %   MCV 75.8 (*) 78.0 - 100.0 fL   MCH 24.2 (*) 26.0 - 34.0  pg   MCHC 31.9  30.0 - 36.0 g/dL   RDW 14.8  11.5 - 15.5 %   Platelets 292  150 - 400 K/uL   Neutrophils Relative % 53  43 - 77 %   Neutro Abs 3.7  1.7 - 7.7 K/uL   Lymphocytes Relative 41  12 - 46 %   Lymphs Abs 2.9  0.7 - 4.0 K/uL   Monocytes Relative 4  3 - 12 %   Monocytes Absolute 0.3  0.1 - 1.0 K/uL   Eosinophils Relative 2  0 - 5 %   Eosinophils Absolute 0.2  0.0 - 0.7 K/uL   Basophils Relative 0  0 - 1 %   Basophils Absolute 0.0  0.0 - 0.1 K/uL  COMPREHENSIVE METABOLIC PANEL      Result Value Ref Range   Sodium 141  137 - 147 mEq/L   Potassium 3.6 (*) 3.7 - 5.3 mEq/L   Chloride 101  96 - 112 mEq/L   CO2 24  19 - 32 mEq/L   Glucose, Bld 128 (*) 70 - 99 mg/dL   BUN 8  6 - 23 mg/dL   Creatinine, Ser 0.79  0.50 - 1.10 mg/dL   Calcium 8.8  8.4 - 10.5 mg/dL   Total Protein 7.3  6.0 - 8.3 g/dL   Albumin 3.6  3.5 - 5.2 g/dL   AST 21  0 - 37 U/L   ALT 32  0 - 35 U/L   Alkaline Phosphatase 116  39 - 117 U/L   Total Bilirubin <0.2 (*) 0.3 - 1.2 mg/dL   GFR calc non Af Amer >90  >90 mL/min   GFR calc Af Amer >90  >90  mL/min   Anion gap 16 (*) 5 - 15  TROPONIN I      Result Value Ref Range   Troponin I <0.30  <0.30 ng/mL  D-DIMER, QUANTITATIVE      Result Value Ref Range   D-Dimer, Quant 0.30  0.00 - 0.48 ug/mL-FEU  POC URINE PREG, ED      Result Value Ref Range   Preg Test, Ur NEGATIVE  NEGATIVE    Labs Review Labs Reviewed  CBC WITH DIFFERENTIAL - Abnormal; Notable for the following:    RBC 5.42 (*)    MCV 75.8 (*)    MCH 24.2 (*)    All other components within normal limits  COMPREHENSIVE METABOLIC PANEL - Abnormal; Notable for the following:    Potassium 3.6 (*)    Glucose, Bld 128 (*)    Total Bilirubin <0.2 (*)    Anion gap 16 (*)    All other components within normal limits  TROPONIN I  D-DIMER, QUANTITATIVE  POC URINE PREG, ED    Imaging Review Dg Chest 2 View  04/30/2014   CLINICAL DATA:  Asthma attack.  Chest pressure.  EXAM: CHEST  2 VIEW  COMPARISON:  PA and lateral chest 03/12/2014.  FINDINGS: Heart size and mediastinal contours are within normal limits. Both lungs are clear. Visualized skeletal structures are unremarkable.  IMPRESSION: Negative exam.   Electronically Signed   By: Inge Rise M.D.   On: 04/30/2014 14:31     EKG Interpretation   Date/Time:  Sunday April 30 2014 13:03:54 EDT Ventricular Rate:  90 PR Interval:  116 QRS Duration: 82 QT Interval:  348 QTC Calculation: 426 R Axis:   82 Text Interpretation:  Sinus rhythm Borderline short PR interval Borderline  repolarization abnormality No significant change since  last tracing  Confirmed by YAO  MD, DAVID ([id number]) on 04/30/2014 4:25:40 PM      MDM   Final diagnoses:  Asthma exacerbation    Medications  sodium chloride 0.9 % bolus 500 mL (0 mLs Intravenous Stopped 04/30/14 1635)  predniSONE (DELTASONE) tablet 60 mg (60 mg Oral Given 04/30/14 1550)  albuterol (PROVENTIL HFA;VENTOLIN HFA) 108 (90 BASE) MCG/ACT inhaler 2 puff (2 puffs Inhalation Given 04/30/14 1553)  albuterol (PROVENTIL) (2.5  MG/3ML) 0.083% nebulizer solution 5 mg (5 mg Nebulization Given 04/30/14 1551)    Filed Vitals:   04/30/14 1530 04/30/14 1545 04/30/14 1600 04/30/14 1615  BP: 109/44 104/42 103/42 110/40  Pulse: 67 80 79 74  Temp:      TempSrc:      Resp: 11 18 20 20   Height:      Weight:      SpO2: 100% 100% 100% 100%   This provider reviewed the patient's chart. Patient was seen and assessed in the ED setting for asthma exacerbation on 03/16/2014. Patient was discharged home with albuterol and prednisone. Patient is followed by Pulmonary physician Dr. George Ina at [facility]. Patient was recently taken off of her maintenance therapy - Qvar last year and mother reported that patient has been having increase in exacerbation.  EKG normal sinus rhythm with a heart rate of 90 beats per minute with short PR interval-nystatin change since last tracing. Troponin negative elevation. D-dimer negative elevation. CBC negative elevated white blood cell count-negative left shift or leukocytosis noted. Hemoglobin 13.1, hematocrit 41.1. CMP negative findings-BUN 8, creatinine 0.79. AST, ALT, alkaline phosphatase, bilirubin negative elevation-AST 21, ALT 32, alkaline phosphatase 116. Urine pregnancy negative. Chest xray negative for acute cardiopulmonary disease.  Doubt PE. Doubt cardiac issue. Suspicion to be asthma exacerbation. Patient ambulated well without difficulty or shortness of breath - pulse ox 94% on room air. Patient feeling well. Patient in no sign of respiratory distress. Patient not septic appearing. Discharged patient. Discussed with patient to rest and stay hydrated. Discussed with patient to call and set-up an appointment with Pulmonology as soon as possible - mother agreed. Discussed with patient to take medications as prescribed, discharged with prednisone. Discussed with patient to closely monitor symptoms and if symptoms are to worsen or change to report back to the ED - strict return instructions given.  Patient  agreed to plan of care, understood, all questions answered.   Jamse Mead, PA-C 04/30/14 [phone]

## 2014-04-30 NOTE — ED Notes (Signed)
Pt pulse ox between 91%-94% while ambulating

## 2014-04-30 NOTE — ED Notes (Signed)
Respiratory therapy at bedside.

## 2014-04-30 NOTE — ED Notes (Signed)
IV team at the bedside. 

## 2014-04-30 NOTE — ED Notes (Addendum)
IV team contacted and responded

## 2014-04-30 NOTE — ED Notes (Signed)
Per EMS: hx of asthma, shortness of breath 2 home nebulizer treatments administered by patient, 2 duonebs given PTA by EMS (10 mg albuterol, 1 mg atrovent), initially wheezes bilaterally in lower lobes as well as upper right.  Now minor/faint inspiratory wheezes in lower lobes.  Slightly tachypnic, states she still feels short of breath.  99% on RA. RT called and on the way

## 2014-04-30 NOTE — Discharge Instructions (Signed)
Please call your doctor for a followup appointment within 24-48 hours. When you talk to your doctor please let them know that you were seen in the emergency department and have them acquire all of your records so that they can discuss the findings with you and formulate a treatment plan to fully care for your new and ongoing problems. Please call and set-up an appointment to be seen and re-assessed by primary doctor and pulmonology Please rest and stay hydrated Please take medications as prescribe Please continue to monitor symptoms closely and if symptoms are to worsen or change (fever greater than 101, chills, sweating, nausea, vomiting, chest pain, shortness of breath, difficulty breathing, numbness, tingling, weakness, difficulty breathing, use of muscles) please report back to the ED immediately    Asthma Asthma is a recurring condition in which the airways tighten and narrow. Asthma can make it difficult to breathe. It can cause coughing, wheezing, and shortness of breath. Asthma episodes, also called asthma attacks, range from minor to life-threatening. Asthma cannot be cured, but medicines and lifestyle changes can help control it. CAUSES Asthma is believed to be caused by inherited (genetic) and environmental factors, but its exact cause is unknown. Asthma may be triggered by allergens, lung infections, or irritants in the air. Asthma triggers are different for each person. Common triggers include:   Animal dander.  Dust mites.  Cockroaches.  Pollen from trees or grass.  Mold.  Smoke.  Air pollutants such as dust, household cleaners, hair sprays, aerosol sprays, paint fumes, strong chemicals, or strong odors.  Cold air, weather changes, and winds (which increase molds and pollens in the air).  Strong emotional expressions such as crying or laughing hard.  Stress.  Certain medicines (such as aspirin) or types of drugs (such as beta-blockers).  Sulfites in foods and drinks.  Foods and drinks that may contain sulfites include dried fruit, potato chips, and sparkling grape juice.  Infections or inflammatory conditions such as the flu, a cold, or an inflammation of the nasal membranes (rhinitis).  Gastroesophageal reflux disease (GERD).  Exercise or strenuous activity. SYMPTOMS Symptoms may occur immediately after asthma is triggered or many hours later. Symptoms include:  Wheezing.  Excessive nighttime or early morning coughing.  Frequent or severe coughing with a common cold.  Chest tightness.  Shortness of breath. DIAGNOSIS  The diagnosis of asthma is made by a review of your medical history and a physical exam. Tests may also be performed. These may include:  Lung function studies. These tests show how much air you breathe in and out.  Allergy tests.  Imaging tests such as X-rays. TREATMENT  Asthma cannot be cured, but it can usually be controlled. Treatment involves identifying and avoiding your asthma triggers. It also involves medicines. There are 2 classes of medicine used for asthma treatment:   Controller medicines. These prevent asthma symptoms from occurring. They are usually taken every day.  Reliever or rescue medicines. These quickly relieve asthma symptoms. They are used as needed and provide short-term relief. Your health care provider will help you create an asthma action plan. An asthma action plan is a written plan for managing and treating your asthma attacks. It includes a list of your asthma triggers and how they may be avoided. It also includes information on when medicines should be taken and when their dosage should be changed. An action plan may also involve the use of a device called a peak flow meter. A peak flow meter measures how well the  lungs are working. It helps you monitor your condition. HOME CARE INSTRUCTIONS   Take medicine as directed by your health care provider. Speak with your health care provider if you have  questions about how or when to take the medicines.  Use a peak flow meter as directed by your health care provider. Record and keep track of readings.  Understand and use the action plan to help minimize or stop an asthma attack without needing to seek medical care.  Control your home environment in the following ways to help prevent asthma attacks:  Do not smoke. Avoid being exposed to secondhand smoke.  Change your heating and air conditioning filter regularly.  Limit your use of fireplaces and wood stoves.  Get rid of pests (such as roaches and mice) and their droppings.  Throw away plants if you see mold on them.  Clean your floors and dust regularly. Use unscented cleaning products.  Try to have someone else vacuum for you regularly. Stay out of rooms while they are being vacuumed and for a short while afterward. If you vacuum, use a dust mask from a hardware store, a double-layered or microfilter vacuum cleaner bag, or a vacuum cleaner with a HEPA filter.  Replace carpet with wood, tile, or vinyl flooring. Carpet can trap dander and dust.  Use allergy-proof pillows, mattress covers, and box spring covers.  Wash bed sheets and blankets every week in hot water and dry them in a dryer.  Use blankets that are made of polyester or cotton.  Clean bathrooms and kitchens with bleach. If possible, have someone repaint the walls in these rooms with mold-resistant paint. Keep out of the rooms that are being cleaned and painted.  Wash hands frequently. SEEK MEDICAL CARE IF:   You have wheezing, shortness of breath, or a cough even if taking medicine to prevent attacks.  The colored mucus you cough up (sputum) is thicker than usual.  Your sputum changes from clear or white to yellow, green, gray, or bloody.  You have any problems that may be related to the medicines you are taking (such as a rash, itching, swelling, or trouble breathing).  You are using a reliever medicine more  than 2-3 times per week.  Your peak flow is still at 50-79% of your personal best after following your action plan for 1 hour. SEEK IMMEDIATE MEDICAL CARE IF:   You seem to be getting worse and are unresponsive to treatment during an asthma attack.  You are short of breath even at rest.  You get short of breath when doing very little physical activity.  You have difficulty eating, drinking, or talking due to asthma symptoms.  You develop chest pain.  You develop a fast heartbeat.  You have a bluish color to your lips or fingernails.  You are lightheaded, dizzy, or faint.  Your peak flow is less than 50% of your personal best.  You have a fever or persistent symptoms for more than 2-3 days.  You have a fever and symptoms suddenly get worse. MAKE SURE YOU:   Understand these instructions.  Will watch your condition.  Will get help right away if you are not doing well or get worse. Document Released: 10/06/2005 Document Revised: 10/11/2013 Document Reviewed: 05/05/2013 [facility] Patient Information 2015 [place], [place]. This information is not intended to replace advice given to you by your health care provider. Make sure you discuss any questions you have with your health care provider.  Asthma Attack Prevention Although there is  no way to prevent asthma from starting, you can take steps to control the disease and reduce its symptoms. Learn about your asthma and how to control it. Take an active role to control your asthma by working with your health care provider to create and follow an asthma action plan. An asthma action plan guides you in:  Taking your medicines properly.  Avoiding things that set off your asthma or make your asthma worse (asthma triggers).  Tracking your level of asthma control.  Responding to worsening asthma.  Seeking emergency care when needed. To track your asthma, keep records of your symptoms, check your peak flow number using a handheld device  that shows how well air moves out of your lungs (peak flow meter), and get regular asthma checkups.  WHAT ARE SOME WAYS TO PREVENT AN ASTHMA ATTACK?  Take medicines as directed by your health care provider.  Keep track of your asthma symptoms and level of control.  With your health care provider, write a detailed plan for taking medicines and managing an asthma attack. Then be sure to follow your action plan. Asthma is an ongoing condition that needs regular monitoring and treatment.  Identify and avoid asthma triggers. Many outdoor allergens and irritants (such as pollen, mold, cold air, and air pollution) can trigger asthma attacks. Find out what your asthma triggers are and take steps to avoid them.  Monitor your breathing. Learn to recognize warning signs of an attack, such as coughing, wheezing, or shortness of breath. Your lung function may decrease before you notice any signs or symptoms, so regularly measure and record your peak airflow with a home peak flow meter.  Identify and treat attacks early. If you act quickly, you are less likely to have a severe attack. You will also need less medicine to control your symptoms. When your peak flow measurements decrease and alert you to an upcoming attack, take your medicine as instructed and immediately stop any activity that may have triggered the attack. If your symptoms do not improve, get medical help.  Pay attention to increasing quick-relief inhaler use. If you find yourself relying on your quick-relief inhaler, your asthma is not under control. See your health care provider about adjusting your treatment. WHAT CAN MAKE MY SYMPTOMS WORSE? A number of common things can set off or make your asthma symptoms worse and cause temporary increased inflammation of your airways. Keep track of your asthma symptoms for several weeks, detailing all the environmental and emotional factors that are linked with your asthma. When you have an asthma attack, go  back to your asthma diary to see which factor, or combination of factors, might have contributed to it. Once you know what these factors are, you can take steps to control many of them. If you have allergies and asthma, it is important to take asthma prevention steps at home. Minimizing contact with the substance to which you are allergic will help prevent an asthma attack. Some triggers and ways to avoid these triggers are: Animal Dander:  Some people are allergic to the flakes of skin or dried saliva from animals with fur or feathers.   There is no such thing as a hypoallergenic dog or cat breed. All dogs or cats can cause allergies, even if they don't shed.  Keep these pets out of your home.  If you are not able to keep a pet outdoors, keep the pet out of your bedroom and other sleeping areas at all times, and keep the door  closed.  Remove carpets and furniture covered with cloth from your home. If that is not possible, keep the pet away from fabric-covered furniture and carpets. Dust Mites: Many people with asthma are allergic to dust mites. Dust mites are tiny bugs that are found in every home in mattresses, pillows, carpets, fabric-covered furniture, bedcovers, clothes, stuffed toys, and other fabric-covered items.   Cover your mattress in a special dust-proof cover.  Cover your pillow in a special dust-proof cover, or wash the pillow each week in hot water. Water must be hotter than 130 F (54.4 C) to kill dust mites. Cold or warm water used with detergent and bleach can also be effective.  Wash the sheets and blankets on your bed each week in hot water.  Try not to sleep or lie on cloth-covered cushions.  Call ahead when traveling and ask for a smoke-free hotel room. Bring your own bedding and pillows in case the hotel only supplies feather pillows and down comforters, which may contain dust mites and cause asthma symptoms.  Remove carpets from your bedroom and those laid on  concrete, if you can.  Keep stuffed toys out of the bed, or wash the toys weekly in hot water or cooler water with detergent and bleach. Cockroaches: Many people with asthma are allergic to the droppings and remains of cockroaches.   Keep food and garbage in closed containers. Never leave food out.  Use poison baits, traps, powders, gels, or paste (for example, boric acid).  If a spray is used to kill cockroaches, stay out of the room until the odor goes away. Indoor Mold:  Fix leaky faucets, pipes, or other sources of water that have mold around them.  Clean floors and moldy surfaces with a fungicide or diluted bleach.  Avoid using humidifiers, vaporizers, or swamp coolers. These can spread molds through the air. Pollen and Outdoor Mold:  When pollen or mold spore counts are high, try to keep your windows closed.  Stay indoors with windows closed from late morning to afternoon. Pollen and some mold spore counts are highest at that time.  Ask your health care provider whether you need to take anti-inflammatory medicine or increase your dose of the medicine before your allergy season starts. Other Irritants to Avoid:  Tobacco smoke is an irritant. If you smoke, ask your health care provider how you can quit. Ask family members to quit smoking, too. Do not allow smoking in your home or car.  If possible, do not use a wood-burning stove, kerosene heater, or fireplace. Minimize exposure to all sources of smoke, including incense, candles, fires, and fireworks.  Try to stay away from strong odors and sprays, such as perfume, talcum powder, hair spray, and paints.  Decrease humidity in your home and use an indoor air cleaning device. Reduce indoor humidity to below 60%. Dehumidifiers or central air conditioners can do this.  Decrease house dust exposure by changing furnace and air cooler filters frequently.  Try to have someone else vacuum for you once or twice a week. Stay out of  rooms while they are being vacuumed and for a short while afterward.  If you vacuum, use a dust mask from a hardware store, a double-layered or microfilter vacuum cleaner bag, or a vacuum cleaner with a HEPA filter.  Sulfites in foods and beverages can be irritants. Do not drink beer or wine or eat dried fruit, processed potatoes, or shrimp if they cause asthma symptoms.  Cold air can trigger an asthma  attack. Cover your nose and mouth with a scarf on cold or windy days.  Several health conditions can make asthma more difficult to manage, including a runny nose, sinus infections, reflux disease, psychological stress, and sleep apnea. Work with your health care provider to manage these conditions.  Avoid close contact with people who have a respiratory infection such as a cold or the flu, since your asthma symptoms may get worse if you catch the infection. Wash your hands thoroughly after touching items that may have been handled by people with a respiratory infection.  Get a flu shot every year to protect against the flu virus, which often makes asthma worse for days or weeks. Also get a pneumonia shot if you have not previously had one. Unlike the flu shot, the pneumonia shot does not need to be given yearly. Medicines:  Talk to your health care provider about whether it is safe for you to take aspirin or non-steroidal anti-inflammatory medicines (NSAIDs). In a small number of people with asthma, aspirin and NSAIDs can cause asthma attacks. These medicines must be avoided by people who have known aspirin-sensitive asthma. It is important that people with aspirin-sensitive asthma read labels of all over-the-counter medicines used to treat pain, colds, coughs, and fever.  Beta-blockers and ACE inhibitors are other medicines you should discuss with your health care provider. HOW CAN I FIND OUT WHAT I AM ALLERGIC TO? Ask your asthma health care provider about allergy skin testing or blood testing  (the RAST test) to identify the allergens to which you are sensitive. If you are found to have allergies, the most important thing to do is to try to avoid exposure to any allergens that you are sensitive to as much as possible. Other treatments for allergies, such as medicines and allergy shots (immunotherapy) are available.  CAN I EXERCISE? Follow your health care provider's advice regarding asthma treatment before exercising. It is important to maintain a regular exercise program, but vigorous exercise or exercise in cold, humid, or dry environments can cause asthma attacks, especially for those people who have exercise-induced asthma. Document Released: 09/24/2009 Document Revised: 10/11/2013 Document Reviewed: 04/13/2013 [facility] Patient Information 2015 [place], [place]. This information is not intended to replace advice given to you by your health care provider. Make sure you discuss any questions you have with your health care provider.

## 2014-05-01 ENCOUNTER — Other Ambulatory Visit: Payer: Self-pay | Admitting: Internal Medicine

## 2014-05-01 ENCOUNTER — Telehealth: Payer: Self-pay | Admitting: Internal Medicine

## 2014-05-01 NOTE — Telephone Encounter (Signed)
Ok to refill 90 days  

## 2014-05-01 NOTE — Telephone Encounter (Signed)
Pt is needing new rx concerta, please call when available for pick up.  Mom has appt with dr. Regis Bill on Wednesday and she will pick up the meds then.

## 2014-05-01 NOTE — ED Provider Notes (Signed)
Medical screening examination/treatment/procedure(s) were performed by non-physician practitioner and as supervising physician I was immediately available for consultation/collaboration.   EKG Interpretation   Date/Time:  Sunday April 30 2014 13:03:54 EDT Ventricular Rate:  90 PR Interval:  116 QRS Duration: 82 QT Interval:  348 QTC Calculation: 426 R Axis:   82 Text Interpretation:  Sinus rhythm Borderline short PR interval Borderline  repolarization abnormality No significant change since last tracing  Confirmed by YAO  MD, DAVID ([id number]) on 04/30/2014 4:25:40 PM        Glenda Johns, MD 05/01/14 2018

## 2014-05-02 MED ORDER — METHYLPHENIDATE HCL ER (OSM) 36 MG PO TBCR: 72.0000 mg | EXTENDED_RELEASE_TABLET | Freq: Every day | ORAL | Status: DC

## 2014-05-02 NOTE — Telephone Encounter (Signed)
Patient notified to pick up at the front desk. 

## 2014-05-02 NOTE — Telephone Encounter (Signed)
Sent to the pharmacy by e-scribe. 

## 2014-05-20 ENCOUNTER — Emergency Department (HOSPITAL_COMMUNITY)
Admission: EM | Admit: 2014-05-20 | Discharge: 2014-05-20 | Disposition: A | Discharge status: Home / Self Care | Payer: Federal, State, Local not specified - PPO | Attending: Emergency Medicine | Admitting: Emergency Medicine

## 2014-05-20 ENCOUNTER — Encounter (HOSPITAL_COMMUNITY): Payer: Self-pay | Admitting: Emergency Medicine

## 2014-05-20 DIAGNOSIS — J45901 Unspecified asthma with (acute) exacerbation: Secondary | ICD-10-CM | POA: Insufficient documentation

## 2014-05-20 DIAGNOSIS — IMO0002 Reserved for concepts with insufficient information to code with codable children: Secondary | ICD-10-CM | POA: Insufficient documentation

## 2014-05-20 DIAGNOSIS — Z79899 Other long term (current) drug therapy: Secondary | ICD-10-CM | POA: Insufficient documentation

## 2014-05-20 DIAGNOSIS — F909 Attention-deficit hyperactivity disorder, unspecified type: Secondary | ICD-10-CM | POA: Insufficient documentation

## 2014-05-20 DIAGNOSIS — Z8742 Personal history of other diseases of the female genital tract: Secondary | ICD-10-CM | POA: Insufficient documentation

## 2014-05-20 DIAGNOSIS — H9190 Unspecified hearing loss, unspecified ear: Secondary | ICD-10-CM | POA: Insufficient documentation

## 2014-05-20 DIAGNOSIS — R0789 Other chest pain: Secondary | ICD-10-CM | POA: Insufficient documentation

## 2014-05-20 MED ORDER — ALBUTEROL SULFATE (2.5 MG/3ML) 0.083% IN NEBU
5.0000 mg | INHALATION_SOLUTION | Freq: Once | RESPIRATORY_TRACT | Status: AC
  Administered 2014-05-20: 5 mg via RESPIRATORY_TRACT
  Filled 2014-05-20: qty 6

## 2014-05-20 MED ORDER — PREDNISONE 20 MG PO TABS
60.0000 mg | ORAL_TABLET | Freq: Once | ORAL | Status: AC
  Administered 2014-05-20: 60 mg via ORAL
  Filled 2014-05-20: qty 3

## 2014-05-20 NOTE — ED Notes (Signed)
MD at bedside. 

## 2014-05-20 NOTE — ED Notes (Signed)
Per EMS, pt has been experiencing SOB since 00:00 this morning. Pt has x of asthma. Pt has received 2 breathing treatments the first was 5 of albuterol and the second 5 of albuterol and . 5 of atrovent. Audible wheezez at this time.

## 2014-05-20 NOTE — ED Provider Notes (Signed)
CSN: [id number]     Arrival date & time 05/20/14  5638 History   First MD Initiated Contact with Patient 05/20/14 0450     Chief Complaint  Patient presents with  . Asthma  . Shortness of Breath      Patient is a 23 y.o. female presenting with asthma and shortness of breath. The history is provided by the patient and a parent.  Asthma This is a recurrent problem. The current episode started 3 to 5 hours ago. The problem occurs constantly. Associated symptoms include shortness of breath. Associated symptoms comments: Chest tightness . Nothing aggravates the symptoms. Relieved by: nebulizers.  Shortness of Breath Associated symptoms: no fever   PT presents for asthma exacerbation She has long h/o asthma, but was previously well controlled but has had increase in flares over past several weeks She was seen by [facility] last week for f/u and started on prednisone taper which she is still on (currently due for prednisone 60mg  today) She reports chest tightness No fever/vomiting/hemoptysis This is similar to previous episodes of asthma  She had complicated bith with pulmonary hemorrhage requiring ECMO However she has not required intubation for asthma since that time   Past Medical History  Diagnosis Date  . Asthma   . Pulmonary hemorrhage of fetus or newborn     54 week C-section [facility] of with birth hypoxia  . Hypoxia of newborn   . Hearing loss in right ear     with hearing aid bilateral   . Hemiparesis     rt from neonatal period  . Allergy   . ADHD (attention deficit hyperactivity disorder)   . Irregular periods 06/04/2011  . BMI (body mass index), pediatric, 95-99% for age 15/10/2010    Is starting to gain weight again and advised and counseled today about reduction for health risk reasons. Patient is very well aware of how to do lifestyle intervention she has done this before.    History reviewed. No pertinent past surgical history. Family History  Problem Relation Age  of Onset  . Adopted: Yes  . Hypertension    . Schizophrenia Mother     with alcohol drug abuse   History  Substance Use Topics  . Smoking status: Never Smoker   . Smokeless tobacco: Not on file  . Alcohol Use: No   OB History   Grav Para Term Preterm Abortions TAB SAB Ect Mult Living                 Review of Systems  Constitutional: Negative for fever.  Respiratory: Positive for shortness of breath.   Cardiovascular:       Chest tightness   All other systems reviewed and are negative.     Allergies  Review of patient's allergies indicates no known allergies.  Home Medications   Prior to Admission medications   Medication Sig Start Date End Date Taking? Authorizing Provider  albuterol (PROVENTIL HFA;VENTOLIN HFA) 108 (90 BASE) MCG/ACT inhaler Inhale 2 puffs into the lungs every 6 (six) hours as needed for wheezing or shortness of breath. 09/09/13   Burnis Medin, MD  albuterol (PROVENTIL) (2.5 MG/3ML) 0.083% nebulizer solution Take 3 mLs (2.5 mg total) by nebulization every 4 (four) hours as needed for wheezing or shortness of breath. 03/12/14   Tatyana A Kirichenko, PA-C  azelastine (ASTELIN) 137 MCG/SPRAY nasal spray 1 spray by Nasal route 2 (two) times daily. Use in each nostril as directed     Historical Provider, MD  hydrOXYzine (ATARAX) 10 MG tablet Take 10 mg by mouth 3 (three) times daily as needed for itching.     Historical Provider, MD  ibuprofen (ADVIL,MOTRIN) 200 MG tablet Take 200 mg by mouth every 6 (six) hours as needed for headache.    Historical Provider, MD  methylphenidate 36 MG PO CR tablet Take 2 tablets (72 mg total) by mouth daily. 05/02/14   Burnis Medin, MD  montelukast (SINGULAIR) 10 MG tablet Take 10 mg by mouth at bedtime. 11/28/13   Burnis Medin, MD  norethindrone-ethinyl estradiol (JUNEL FE,GILDESS FE,LOESTRIN FE) 1-20 MG-MCG tablet Take 1 tablet by mouth daily.    Historical Provider, MD  omeprazole (PRILOSEC) 20 MG capsule Take 20 mg by  mouth daily.    Historical Provider, MD  omeprazole (PRILOSEC) 20 MG capsule TAKE ONE (1) CAPSULE BY MOUTH EACH DAY    Burnis Medin, MD  predniSONE (DELTASONE) 20 MG tablet 3 tabs po day one, then 2 tabs daily x 4 days 04/30/14   Marissa Sciacca, PA-C   BP 137/72  Pulse 79  Temp(Src) 97.8 F (36.6 C) (Oral)  Resp 13  SpO2 99%  LMP 05/20/2014 Physical Exam CONSTITUTIONAL: Well developed/well nourished HEAD: Normocephalic/atraumatic EYES: EOMI/PERRL ENMT: Mucous membranes moist, uvula midline, no stridor NECK: supple no meningeal signs SPINE:entire spine nontender CV: S1/S2 noted, no murmurs/rubs/gallops noted LUNGS: scattered wheeze noted bilaterally.  Tachypnea noted.  Pt is able to speak to me  ABDOMEN: soft, nontender, no rebound or guarding NEURO: Pt is awake/alert, moves all extremitiesx4 EXTREMITIES: pulses normal, full ROM, no LE edema noted SKIN: warm, color normal PSYCH: no abnormalities of mood noted  ED Course  Procedures  6:48 AM Pt improved Ambulatory and no hypoxia Her wheeze has resolved No CP reported No stridor noted She feels at baseline I doubt ACS/PE/CHF at this time She will continue prednisone taper and f/u with her pulmonologist  MDM   Final diagnoses:  Asthma exacerbation    Nursing notes including past medical history and social history reviewed and considered in documentation Previous records reviewed and considered     Sharyon Cable, MD 05/20/14 [phone]

## 2014-05-20 NOTE — ED Notes (Signed)
Ambulated pt on the unit pt complained of minor shortness of breath oxygen saturations remained at 97% (on room air) no other complaints noted at this time

## 2014-05-20 NOTE — Discharge Instructions (Signed)

## 2014-05-27 ENCOUNTER — Ambulatory Visit (INDEPENDENT_AMBULATORY_CARE_PROVIDER_SITE_OTHER): Payer: Federal, State, Local not specified - PPO | Admitting: Family Medicine

## 2014-05-27 ENCOUNTER — Encounter: Payer: Self-pay | Admitting: Family Medicine

## 2014-05-27 VITALS — BP 122/82 | HR 87 | Temp 98.6°F | Wt 254.0 lb

## 2014-05-27 DIAGNOSIS — R0789 Other chest pain: Secondary | ICD-10-CM | POA: Insufficient documentation

## 2014-05-27 DIAGNOSIS — J45901 Unspecified asthma with (acute) exacerbation: Secondary | ICD-10-CM

## 2014-05-27 DIAGNOSIS — R071 Chest pain on breathing: Secondary | ICD-10-CM

## 2014-05-27 DIAGNOSIS — J4541 Moderate persistent asthma with (acute) exacerbation: Secondary | ICD-10-CM

## 2014-05-27 MED ORDER — PREDNISONE 10 MG PO TABS: 10.0000 mg | ORAL_TABLET | Freq: Every day | ORAL | Status: DC

## 2014-05-27 NOTE — Progress Notes (Signed)
OFFICE NOTE  05/27/2014  CC:  Chief Complaint  Patient presents with  . Asthma   HPI: Patient is a 23 y.o. African-American female who is here for recurrent wheezing. Hx of asthma, was taken off controller meds b/c PFTs were good and pt wanted to come off meds--This was at [facility] around 09/2013.  She did well from December until May 2015 and at that time began to have recurrent wheezing.  Since that time, she has had her mom call EMS multiple times, has had to go to ED 3 times over the last 3 mo, each time getting nebs and systemic steroid bursts. Restarted controller meds: now on advair and spireva since 3 d/a.   Currently on prednisone via [facility] pulm--she is at the end of 2 week taper that started at 60mg  qd.  She finishes her 20mg  daily dosing in 2d and then goes to 10mg  qd dosing x 2d.    Now here for central substernal pain/tightness that she has been telling people about but says she hasn't been told why she is having this.  Took albuterol and it helps some, most recent alb neb was about 9 hours ago. No fevers.  Some cough in last 12h prod of small amount of white mucous. Sx's always worse in night-time.  She says when she presses on the middle of her chest from sternal notch down to xyphoid she feels like it is tender.  Pertinent PMH:  PMH and PSH reviewed.  MEDS: Advair HFA 230/21, spiriva qd Outpatient Prescriptions Prior to Visit  Medication Sig Dispense Refill  . albuterol (PROVENTIL HFA;VENTOLIN HFA) 108 (90 BASE) MCG/ACT inhaler Inhale 2 puffs into the lungs every 6 (six) hours as needed for wheezing or shortness of breath.  1 Inhaler  4  . albuterol (PROVENTIL) (2.5 MG/3ML) 0.083% nebulizer solution Take 3 mLs (2.5 mg total) by nebulization every 4 (four) hours as needed for wheezing or shortness of breath.  75 mL  2  . hydrOXYzine (ATARAX) 10 MG tablet Take 10 mg by mouth 3 (three) times daily as needed for itching.       Marland Kitchen ibuprofen (ADVIL,MOTRIN) 200 MG tablet Take  200 mg by mouth every 6 (six) hours as needed for headache.      . methylphenidate 36 MG PO CR tablet Take 2 tablets (72 mg total) by mouth daily.  180 tablet  0  . montelukast (SINGULAIR) 10 MG tablet Take 10 mg by mouth at bedtime.      . norethindrone-ethinyl estradiol (JUNEL FE,GILDESS FE,LOESTRIN FE) 1-20 MG-MCG tablet Take 1 tablet by mouth daily.      Marland Kitchen omeprazole (PRILOSEC) 20 MG capsule Take 20 mg by mouth daily.      . predniSONE (DELTASONE) 20 MG tablet Take 60 mg by mouth daily with breakfast.      . azelastine (ASTELIN) 137 MCG/SPRAY nasal spray 1 spray by Nasal route 2 (two) times daily. Use in each nostril as directed        No facility-administered medications prior to visit.    PE: Blood pressure 122/82, pulse 87, temperature 98.6 F (37 C), temperature source Oral, weight 254 lb (115.214 kg), last menstrual period 05/20/2014, SpO2 97.00%. Gen: Alert, well appearing.  Patient is oriented to person, place, time, and situation. AFFECT: pleasant, lucid thought and speech. ENT: Ears: EACs clear, normal epithelium.  TMs with good light reflex and landmarks bilaterally.  Eyes: no injection, icteris, swelling, or exudate.  EOMI, PERRLA. Nose: no drainage  or turbinate edema/swelling.  No injection or focal lesion.  Mouth: lips without lesion/swelling.  Oral mucosa pink and moist.  Dentition intact and without obvious caries or gingival swelling.  Oropharynx without erythema, exudate, or swelling.  Neck - No masses or thyromegaly or limitation in range of motion CV: RRR, no m/r/g.   LUNGS: CTA bilat, nonlabored resps, good aeration in all lung fields. Chest wall: TTP in sternum and costochondral areas diffusely. EXT: no clubbing, cyanosis, or edema.    IMPRESSION AND PLAN:  Moderate persistent asthma, poor control as of last few months.  I will extend her steroids a little bit: continue 20mg  qd dose for another 7d, then do 10mg  qd x 7d, then stop. I reassured her that her chest  pains are primarily musculoskeletal and are to be expected given the severity of her sx's the last few months.   I recommended she continue ibuprofen 400-600mg  tid with food---she says this has been helpful for the pain. Continue current controller therapy and prn albuterol nebs.  Keep f/u with either her [facility] pulm MD or with Dr. Regis Bill.  An After Visit Summary was printed and given to the patient.  FOLLOW UP: prn

## 2014-06-19 ENCOUNTER — Encounter: Payer: Self-pay | Admitting: Internal Medicine

## 2014-06-20 ENCOUNTER — Encounter: Payer: Self-pay | Admitting: Internal Medicine

## 2014-06-20 ENCOUNTER — Ambulatory Visit (INDEPENDENT_AMBULATORY_CARE_PROVIDER_SITE_OTHER): Payer: Federal, State, Local not specified - PPO | Admitting: Internal Medicine

## 2014-06-20 VITALS — BP 134/70 | Temp 98.5°F | Ht 66.5 in | Wt 266.0 lb

## 2014-06-20 DIAGNOSIS — IMO0001 Reserved for inherently not codable concepts without codable children: Secondary | ICD-10-CM | POA: Insufficient documentation

## 2014-06-20 DIAGNOSIS — J45909 Unspecified asthma, uncomplicated: Secondary | ICD-10-CM

## 2014-06-20 DIAGNOSIS — J4520 Mild intermittent asthma, uncomplicated: Secondary | ICD-10-CM

## 2014-06-20 DIAGNOSIS — R635 Abnormal weight gain: Secondary | ICD-10-CM

## 2014-06-20 DIAGNOSIS — R21 Rash and other nonspecific skin eruption: Secondary | ICD-10-CM

## 2014-06-20 DIAGNOSIS — Z23 Encounter for immunization: Secondary | ICD-10-CM

## 2014-06-20 DIAGNOSIS — F909 Attention-deficit hyperactivity disorder, unspecified type: Secondary | ICD-10-CM

## 2014-06-20 MED ORDER — METHYLPHENIDATE HCL ER (OSM) 36 MG PO TBCR: 72.0000 mg | EXTENDED_RELEASE_TABLET | Freq: Every day | ORAL | Status: DC

## 2014-06-20 MED ORDER — OMEPRAZOLE 20 MG PO CPDR: 20.0000 mg | DELAYED_RELEASE_CAPSULE | Freq: Two times a day (BID) | ORAL | Status: DC

## 2014-06-20 MED ORDER — KETOCONAZOLE 2 % EX CREA: 1.0000 "application " | TOPICAL_CREAM | Freq: Two times a day (BID) | CUTANEOUS | Status: DC

## 2014-06-20 MED ORDER — METHYLPHENIDATE HCL ER (OSM) 36 MG PO TBCR: 36.0000 mg | EXTENDED_RELEASE_TABLET | Freq: Every day | ORAL | Status: DC

## 2014-06-20 NOTE — Patient Instructions (Addendum)
Increase the  prilosec to twice a day.   Add anti yeast  Cream  ti the arm pits  Cause this could be yeast and prednisone rash .  ? Suspect   Being off of   Controller inhalers may have been the problem .  Nocturnal  Sx tend to be asthmatic pattern.  ROV in 1 month  or as needed .

## 2014-06-20 NOTE — Progress Notes (Signed)
Pre visit review using our clinic review tool, if applicable. No additional management support is needed unless otherwise documented below in the visit note.  Chief Complaint  Patient presents with  . Follow-up    HPI: Patient comes in today with mom for followup of recent flareups of asthma that has sent her to the emergency room and required up to 3-4 weeks of prednisone. She is now currently on spiriva and advair.   HFA Randomly happening   this summer [place]  At about 2 -3 am tightness in cheast and sore and then wehezing. EMS had to be called and she was seen acutely. Mom was told by ED followup to go by a [place] in a car in the future if this happens again. Her mom stated that she has not had an asthma attack like this for years that she can remember. Uncertain triggers no coughing in the day or even at night At one time had heart burn  2 weeks ago .  Took ibuprofen for pain.  And given porevacid  She has appointment with her new [facility] pulmonologist in October. Apparently had normal PFTs q var in July with b agonist .    No advair  Hfa.  2 puffs.    ADHD  continuing on 72 mg of Concerta takes it rta every day   "Helps focus and not functioning and gets on mom nerves. "  Now has rash under arm pits and has   felt to be from the steroids had been given a cream by her specialist in the past for this the prednisone had given her side effects including weight gain. ROS: See pertinent positives and negatives per HPI. No syncope itchy rashes midline chest pain as above no pleurisy hemoptysis. No unusual fevers. She is now attending school [employer] 2 courses. Denies nocturnal choking or sleep apnea symptoms.  Past Medical History  Diagnosis Date  . Asthma   . Pulmonary hemorrhage of fetus or newborn     40 week C-section [facility] of with birth hypoxia  . Hypoxia of newborn   . Hearing loss in right ear     with hearing aid bilateral   . Hemiparesis     rt from neonatal period  . Allergy   .  ADHD (attention deficit hyperactivity disorder)   . Irregular periods 06/04/2011  . BMI (body mass index), pediatric, 95-99% for age 44/10/2010    Is starting to gain weight again and advised and counseled today about reduction for health risk reasons. Patient is very well aware of how to do lifestyle intervention she has done this before.     Family History  Problem Relation Age of Onset  . Adopted: Yes  . Hypertension    . Schizophrenia Mother     with alcohol drug abuse    History   Social History  . Marital Status: Single    Spouse Name: N/A    Number of Children: N/A  . Years of Education: N/A   Social History Main Topics  . Smoking status: Never Smoker   . Smokeless tobacco: None  . Alcohol Use: No  . Drug Use: No  . Sexual Activity: Yes    Birth Control/ Protection: Pill   Other Topics Concern  . None   Social History Narrative   Adopted and raised by family member [employer]  graduate [employer] second semester   Living at home with mom and has no car  Sleep 8+ hours   Seeing Dr. [name]- pulm at [facility] now has a new pulmonary Dr. Now stable and on prn       Thyroid evaluation in the past neck evaluation within normal limits and no thyroid disease.    Outpatient Encounter Prescriptions as of 06/20/2014  Medication Sig  . albuterol (PROVENTIL HFA;VENTOLIN HFA) 108 (90 BASE) MCG/ACT inhaler Inhale 2 puffs into the lungs every 6 (six) hours as needed for wheezing or shortness of breath.  Marland Kitchen albuterol (PROVENTIL) (2.5 MG/3ML) 0.083% nebulizer solution Take 3 mLs (2.5 mg total) by nebulization every 4 (four) hours as needed for wheezing or shortness of breath.  Marland Kitchen azelastine (ASTELIN) 137 MCG/SPRAY nasal spray 1 spray by Nasal route 2 (two) times daily. Use in each nostril as directed   . fluticasone-salmeterol (ADVAIR HFA) 230-21 MCG/ACT inhaler Inhale into the lungs.  . hydrOXYzine (ATARAX) 10 MG tablet Take 10 mg by mouth 3 (three) times  daily as needed for itching.   Marland Kitchen ibuprofen (ADVIL,MOTRIN) 200 MG tablet Take 200 mg by mouth every 6 (six) hours as needed for headache.  . methylphenidate 36 MG PO CR tablet Take 2 tablets (72 mg total) by mouth daily.  . methylphenidate 36 MG PO CR tablet Take 2 tablets (72 mg total) by mouth daily.  . methylphenidate 36 MG PO CR tablet Take 1 tablet (36 mg total) by mouth daily.  . montelukast (SINGULAIR) 10 MG tablet Take 10 mg by mouth at bedtime.  . norethindrone-ethinyl estradiol (JUNEL FE,GILDESS FE,LOESTRIN FE) 1-20 MG-MCG tablet Take 1 tablet by mouth daily.  Marland Kitchen omeprazole (PRILOSEC) 20 MG capsule Take 1 capsule (20 mg total) by mouth 2 (two) times daily before a meal.  . tiotropium (SPIRIVA) 18 MCG inhalation capsule Place into inhaler and inhale.  . [DISCONTINUED] methylphenidate 36 MG PO CR tablet Take 2 tablets (72 mg total) by mouth daily.  . [DISCONTINUED] methylphenidate 36 MG PO CR tablet Take 36 mg by mouth daily.  . [DISCONTINUED] methylphenidate 36 MG PO CR tablet Take 36 mg by mouth daily.  . [DISCONTINUED] omeprazole (PRILOSEC) 20 MG capsule Take 20 mg by mouth daily.  Marland Kitchen ketoconazole (NIZORAL) 2 % cream Apply 1 application topically 2 (two) times daily. For axillary rash  . [DISCONTINUED] predniSONE (DELTASONE) 10 MG tablet Take 1 tablet (10 mg total) by mouth daily with breakfast.  . [DISCONTINUED] predniSONE (DELTASONE) 20 MG tablet Take 60 mg by mouth daily with breakfast.    EXAM:  BP 134/70  Temp(Src) 98.5 F (36.9 C) (Oral)  Ht 5' 6.5" (1.689 m)  Wt 266 lb (120.657 kg)  BMI 42.30 kg/m2  Body mass index is 42.3 kg/(m^2).  GENERAL: vitals reviewed and listed above, alert, oriented, appears well hydrated and in no acute distress HEENT: atraumatic, conjunctiva  clear, no obvious abnormalities on inspection of external nose and ears OP : no lesion edema or exudate  NECK: no obvious masses on inspection palpation  LUNGS: clear to auscultation bilaterally, no  wheezes, rales or rhonchi, good air movement CV: HRRR, no clubbing cyanosis or  peripheral edema nl cap refill  MS: moves all extremities without noticeable focal  abnormality PSYCH: pleasant and cooperative, no obvious depression or anxiety Wt Readings from Last 3 Encounters:  06/20/14 266 lb (120.657 kg)  05/27/14 254 lb (115.214 kg)  04/30/14 240 lb (108.863 kg)    ASSESSMENT AND PLAN:  Discussed the following assessment and plan:  Moderate intermittent asthma, uncomplicated - Recent significant flares possibly because  she was off controller medicines for a while required almost a month steroid taper  Need for prophylactic vaccination and inoculation against influenza - Plan: Flu Vaccine QUAD 36+ mos PF IM (Fluarix Quad PF)  Rash, skin - Axillary looks monilial versus steroid related add topical Antifungal  yeast keep dry follow up if persistent  Weight gain - More recently aggravated by prednisone  Attention deficit disorder with hyperactivity(314.01) - Continued medicine no change refill x3 months benefit more than risk   Severe  flair causing ed visit and  almoset a month of prednoisone  these flares or nocturnal about true 3 AM and wake her up. She will followup with her specialists in October but have her followup with me in about a month. She's been off the steroids for least a week and hopefully no more flares. Increase her Prilosec to twice a day.   Secondary axillary rasj ? Candida. Treat as appropriate Unfortunately weight gain recently which is probably aggravate her situation -Patient advised to return or notify health care team  if symptoms worsen ,persist or new concerns arise.  Patient Instructions  Increase the  prilosec to twice a day.   Add anti yeast  Cream  ti the arm pits  Cause this could be yeast and prednisone rash .  ? Suspect   Being off of   Controller inhalers may have been the problem .  Nocturnal  Sx tend to be asthmatic pattern.  ROV in 1 month   or as needed .   Standley Brooking. Panosh M.D.

## 2014-07-21 ENCOUNTER — Ambulatory Visit (INDEPENDENT_AMBULATORY_CARE_PROVIDER_SITE_OTHER): Payer: Federal, State, Local not specified - PPO | Admitting: Internal Medicine

## 2014-07-21 ENCOUNTER — Encounter: Payer: Self-pay | Admitting: Internal Medicine

## 2014-07-21 VITALS — BP 118/70 | Temp 98.1°F | Ht 66.5 in | Wt 271.0 lb

## 2014-07-21 DIAGNOSIS — R21 Rash and other nonspecific skin eruption: Secondary | ICD-10-CM

## 2014-07-21 DIAGNOSIS — J4540 Moderate persistent asthma, uncomplicated: Secondary | ICD-10-CM

## 2014-07-21 DIAGNOSIS — IMO0001 Reserved for inherently not codable concepts without codable children: Secondary | ICD-10-CM

## 2014-07-21 DIAGNOSIS — N926 Irregular menstruation, unspecified: Secondary | ICD-10-CM

## 2014-07-21 DIAGNOSIS — J4520 Mild intermittent asthma, uncomplicated: Secondary | ICD-10-CM

## 2014-07-21 DIAGNOSIS — Z3041 Encounter for surveillance of contraceptive pills: Secondary | ICD-10-CM

## 2014-07-21 MED ORDER — NORETHIN ACE-ETH ESTRAD-FE 1-20 MG-MCG PO TABS: 1.0000 | ORAL_TABLET | Freq: Every day | ORAL | Status: DC

## 2014-07-21 NOTE — Progress Notes (Signed)
Chief Complaint  Patient presents with  . Follow-up    HPI: Glenda Mendez here fo fu asthma and rash  See last visit  She was taken off controller inhaler by specialist but required hosp ed visit  And intervetnion since last visit . Would like a refill of the birth control pills doing well no significant side effects. Rash under the armpits got better and then flared up over the last 2 days. Itching and states no obvious cause otherwise is using a cream given to by other physician. ROS: See pertinent positives and negatives per HPI. No xp sob drinks tea and juices   Past Medical History  Diagnosis Date  . Asthma   . Pulmonary hemorrhage of fetus or newborn     51 week C-section [facility] of with birth hypoxia  . Hypoxia of newborn   . Hearing loss in right ear     with hearing aid bilateral   . Hemiparesis     rt from neonatal period  . Allergy   . ADHD (attention deficit hyperactivity disorder)   . Irregular periods 06/04/2011  . BMI (body mass index), pediatric, 95-99% for age 77/10/2010    Is starting to gain weight again and advised and counseled today about reduction for health risk reasons. Patient is very well aware of how to do lifestyle intervention she has done this before.     Family History  Problem Relation Age of Onset  . Adopted: Yes  . Hypertension    . Schizophrenia Mother     with alcohol drug abuse    History   Social History  . Marital Status: Single    Spouse Name: N/A    Number of Children: N/A  . Years of Education: N/A   Social History Main Topics  . Smoking status: Never Smoker   . Smokeless tobacco: None  . Alcohol Use: No  . Drug Use: No  . Sexual Activity: Yes    Birth Control/ Protection: Pill   Other Topics Concern  . None   Social History Narrative   Adopted and raised by family member Najah Liverman   [employer] high school  graduate [employer] second semester   Living at home with mom and has no car   Sleep 8+ hours   Seeing Dr. [name]- pulm at [facility] now has a new pulmonary Dr. Now stable and on prn       Thyroid evaluation in the past neck evaluation within normal limits and no thyroid disease.    Outpatient Encounter Prescriptions as of 07/21/2014  Medication Sig  . albuterol (PROVENTIL HFA;VENTOLIN HFA) 108 (90 BASE) MCG/ACT inhaler Inhale 2 puffs into the lungs every 6 (six) hours as needed for wheezing or shortness of breath.  Marland Kitchen albuterol (PROVENTIL) (2.5 MG/3ML) 0.083% nebulizer solution Take 3 mLs (2.5 mg total) by nebulization every 4 (four) hours as needed for wheezing or shortness of breath.  Marland Kitchen azelastine (ASTELIN) 137 MCG/SPRAY nasal spray 1 spray by Nasal route 2 (two) times daily. Use in each nostril as directed   . fluticasone-salmeterol (ADVAIR HFA) 230-21 MCG/ACT inhaler Inhale into the lungs.  . hydrOXYzine (ATARAX) 10 MG tablet Take 10 mg by mouth 3 (three) times daily as needed for itching.   Marland Kitchen ibuprofen (ADVIL,MOTRIN) 200 MG tablet Take 200 mg by mouth every 6 (six) hours as needed for headache.  . ketoconazole (NIZORAL) 2 % cream Apply 1 application topically 2 (two) times daily. For axillary rash  . methylphenidate  36 MG PO CR tablet Take 2 tablets (72 mg total) by mouth daily.  . methylphenidate 36 MG PO CR tablet Take 2 tablets (72 mg total) by mouth daily.  . methylphenidate 36 MG PO CR tablet Take 1 tablet (36 mg total) by mouth daily.  . montelukast (SINGULAIR) 10 MG tablet Take 10 mg by mouth at bedtime.  . norethindrone-ethinyl estradiol (JUNEL FE,GILDESS FE,LOESTRIN FE) 1-20 MG-MCG tablet Take 1 tablet by mouth daily.  Marland Kitchen omeprazole (PRILOSEC) 20 MG capsule Take 1 capsule (20 mg total) by mouth 2 (two) times daily before a meal.  . tiotropium (SPIRIVA) 18 MCG inhalation capsule Place into inhaler and inhale.  . [DISCONTINUED] norethindrone-ethinyl estradiol (JUNEL FE,GILDESS FE,LOESTRIN FE) 1-20 MG-MCG tablet Take 1 tablet by mouth daily.    EXAM:  BP 118/70  Temp(Src) 98.1 F  (36.7 C) (Oral)  Ht 5' 6.5" (1.689 m)  Wt 271 lb (122.925 kg)  BMI 43.09 kg/m2  Body mass index is 43.09 kg/(m^2). Here with mom because patient requested GENERAL: vitals reviewed and listed above, alert, oriented, appears well hydrated and in no acute distress HEENT: atraumatic, conjunctiva  clear, no obvious abnormalities on inspection of external nose and ears OP : no lesion edema or exudate  NECK: no obvious masses on inspection palpation  LUNGS: clear to auscultation bilaterally, no wheezes, rales or rhonchi, good air movement CV: HRRR, no clubbing cyanosis o nl cap refill  Axilla he did but still present extremities-looking rash with some papular no pustular or vesicular changes. MS: moves all extremities without noticeable focal  abnormality PSYCH: pleasant and cooperative, no obvious depression or anxiety  ASSESSMENT AND PLAN:  Discussed the following assessment and plan:  Moderate intermittent asthma, uncomplicated - back on controller and stable so far   Moderate persistent asthma without complication  Rash, skin  Oral contraceptive pill surveillance - refill for a year benefot more than risk  Irregular periods Counseled weight loss  Dec sugars  To fu with pulmonary specialist  -Patient advised to return or notify health care team  if symptoms worsen ,persist or new concerns arise.  Patient Instructions  Stay on controller medications. Healthy weight loss may help asthma and rash. Challenge no sugar beverages .     If rash  persistent or progressive can see  Dermatology  Keep area dry .  Standley Brooking. Panosh M.D.

## 2014-07-21 NOTE — Patient Instructions (Signed)
Stay on controller medications. Healthy weight loss may help asthma and rash. Challenge no sugar beverages .     If rash  persistent or progressive can see  Dermatology  Keep area dry .

## 2014-08-29 ENCOUNTER — Ambulatory Visit (INDEPENDENT_AMBULATORY_CARE_PROVIDER_SITE_OTHER): Payer: Federal, State, Local not specified - PPO | Admitting: Internal Medicine

## 2014-08-29 ENCOUNTER — Telehealth: Payer: Self-pay | Admitting: Internal Medicine

## 2014-08-29 ENCOUNTER — Encounter: Payer: Self-pay | Admitting: Internal Medicine

## 2014-08-29 VITALS — BP 128/86 | HR 102 | Temp 98.6°F | Wt 279.0 lb

## 2014-08-29 DIAGNOSIS — J069 Acute upper respiratory infection, unspecified: Secondary | ICD-10-CM

## 2014-08-29 DIAGNOSIS — J45901 Unspecified asthma with (acute) exacerbation: Secondary | ICD-10-CM | POA: Insufficient documentation

## 2014-08-29 MED ORDER — PREDNISONE 20 MG PO TABS: ORAL_TABLET | ORAL | Status: DC

## 2014-08-29 MED ORDER — ALBUTEROL SULFATE (2.5 MG/3ML) 0.083% IN NEBU
2.5000 mg | INHALATION_SOLUTION | Freq: Once | RESPIRATORY_TRACT | Status: AC
  Administered 2014-08-29: 2.5 mg via RESPIRATORY_TRACT

## 2014-08-29 NOTE — Patient Instructions (Signed)
This seems like viral infection triggered  asthma . Take prednisone and   Albuterol every 6 hours as needed until better . Fu if  persistent or progressive .

## 2014-08-29 NOTE — Progress Notes (Signed)
Pre visit review using our clinic review tool, if applicable. No additional management support is needed unless otherwise documented below in the visit note.   Chief Complaint  Patient presents with  . Cough    x4days  . Nasal Congestion  . Sore Throat  . Hoarse  . Emesis  . Headache  . Tinnitus    HPI: Patient Glenda Mendez  comes in today for SDA for  new problem evaluation. Thought it was allergies but over the last 4 days or so has had mild sore throat hoarse nasal congestion sore throat nausea and ringing in her ears with a headache. No associated fever has been following her peak flows and she is back on a controller medicine for her asthma Advair. Her pulmonary specialist is at [facility]. She has been monitoring her peak flows and they have been up and down but the last 2 days have been in the high 300s which is lower for her she used an albuterol nebulizer early this morning. Seems to help a little bit. Took robitussin   She is now working at [employer] which is close to the home wonders if she got chilled there is been working there for about 6 weeks. ROS: See pertinent positives and negatives per HPI. No cp syncope fever.   Past Medical History  Diagnosis Date  . Asthma   . Pulmonary hemorrhage of fetus or newborn     23 week C-section [facility] of with birth hypoxia  . Hypoxia of newborn   . Hearing loss in right ear     with hearing aid bilateral   . Hemiparesis     rt from neonatal period  . Allergy   . ADHD (attention deficit hyperactivity disorder)   . Irregular periods 06/04/2011  . BMI (body mass index), pediatric, 95-99% for age 67/10/2010    Is starting to gain weight again and advised and counseled today about reduction for health risk reasons. Patient is very well aware of how to do lifestyle intervention she has done this before.     Family History  Problem Relation Age of Onset  . Adopted: Yes  . Hypertension    . Schizophrenia Mother     with alcohol  drug abuse    History   Social History  . Marital Status: Single    Spouse Name: N/A    Number of Children: N/A  . Years of Education: N/A   Social History Main Topics  . Smoking status: Never Smoker   . Smokeless tobacco: None  . Alcohol Use: No  . Drug Use: No  . Sexual Activity: Yes    Birth Control/ Protection: Pill   Other Topics Concern  . 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 [name]- pulm at [facility] now has a new pulmonary Dr. Now stable and on prn       Thyroid evaluation in the past neck evaluation within normal limits and no thyroid disease.    Outpatient Encounter Prescriptions as of 08/29/2014  Medication Sig  . albuterol (PROVENTIL HFA;VENTOLIN HFA) 108 (90 BASE) MCG/ACT inhaler Inhale 2 puffs into the lungs every 6 (six) hours as needed for wheezing or shortness of breath.  Marland Kitchen albuterol (PROVENTIL) (2.5 MG/3ML) 0.083% nebulizer solution Take 3  mLs (2.5 mg total) by nebulization every 4 (four) hours as needed for wheezing or shortness of breath.  Marland Kitchen azelastine (ASTELIN) 137 MCG/SPRAY nasal spray 1 spray by Nasal route 2 (two) times daily. Use in each nostril as directed   . fluticasone-salmeterol (ADVAIR HFA) 230-21 MCG/ACT inhaler Inhale into the lungs.  . hydrOXYzine (ATARAX) 10 MG tablet Take 10 mg by mouth 3 (three) times daily as needed for itching.   Marland Kitchen ibuprofen (ADVIL,MOTRIN) 200 MG tablet Take 200 mg by mouth every 6 (six) hours as needed for headache.  . ketoconazole (NIZORAL) 2 % cream Apply 1 application topically 2 (two) times daily. For axillary rash  . methylphenidate 36 MG PO CR tablet Take 2 tablets (72 mg total) by mouth daily.  . methylphenidate 36 MG PO CR tablet Take 2 tablets (72 mg total) by mouth daily.  . methylphenidate 36 MG PO CR tablet Take 1 tablet (36 mg  total) by mouth daily.  . montelukast (SINGULAIR) 10 MG tablet Take 10 mg by mouth at bedtime.  . norethindrone-ethinyl estradiol (JUNEL FE,GILDESS FE,LOESTRIN FE) 1-20 MG-MCG tablet Take 1 tablet by mouth daily.  Marland Kitchen omeprazole (PRILOSEC) 20 MG capsule Take 1 capsule (20 mg total) by mouth 2 (two) times daily before a meal.  . tiotropium (SPIRIVA) 18 MCG inhalation capsule Place into inhaler and inhale.  . predniSONE (DELTASONE) 20 MG tablet Take 3 po qd for 2 days then 2 po qd for 3 days,or as directed  . albuterol (PROVENTIL) (2.5 MG/3ML) 0.083% nebulizer solution 2.5 mg     EXAM:  BP 128/86 mmHg  Pulse 102  Temp(Src) 98.6 F (37 C) (Oral)  Wt 279 lb (126.554 kg)  SpO2 98%  Body mass index is 44.36 kg/(m^2).  GENERAL: vitals reviewed and listed above, alert, oriented, appears well hydrated and in no acute distresss  congested and a bit more short of breath than usual sometimes breathless  No flaring color is good  HEENT: atraumatic, conjunctiva  clear, no obvious abnormalities on inspection of external nose and ears nose congested  tms normal  OP : no lesion edema or exudate  NECK: no obvious masses on inspection palpation  LUNGS: clear to auscultation bilaterally, no wheezes, rales or rhonchi,  Tight   Neb given some increase  Airflow no acute wheezes CV: HRRR, no clubbing cyanosis ornl cap refill  MS: moves all extremities without noticeable focal  abnormality PSYCH: pleasant and cooperative,  Interviewed with her and her mom and then examined her separately.  ASSESSMENT AND PLAN:  Discussed the following assessment and plan:  Asthma with acute exacerbation, unspecified asthma severity - rescue q6 hours for now and pred 5 day burst . fu here or pum if not improved - Plan: albuterol (PROVENTIL) (2.5 MG/3ML) 0.083% nebulizer solution 2.5 mg  URI, acute - most likely  trigger   -Patient advised to return or notify health care team  if symptoms worsen ,persist or new concerns  arise.  Patient Instructions  This seems like viral infection triggered  asthma . Take prednisone and   Albuterol every 6 hours as needed until better . Fu if  persistent or progressive .    Standley Brooking. Panosh M.D. Reviewed plan with Akirah and also her mom

## 2014-08-29 NOTE — Telephone Encounter (Signed)
Pt has been sch for today 345pm

## 2014-08-29 NOTE — Telephone Encounter (Signed)
Pt has sore throat and cough and mom is requesting appt today. Pt has been sick for 2 days. Pt has asthma

## 2014-10-07 ENCOUNTER — Ambulatory Visit (INDEPENDENT_AMBULATORY_CARE_PROVIDER_SITE_OTHER): Payer: Federal, State, Local not specified - PPO | Admitting: Family Medicine

## 2014-10-07 ENCOUNTER — Encounter: Payer: Self-pay | Admitting: Family Medicine

## 2014-10-07 VITALS — BP 122/74 | Temp 98.4°F | Wt 280.1 lb

## 2014-10-07 DIAGNOSIS — J45901 Unspecified asthma with (acute) exacerbation: Secondary | ICD-10-CM

## 2014-10-07 NOTE — Progress Notes (Signed)
   Subjective:    Patient ID: Glenda Mendez, female    DOB: December 17, 1990, 23 y.o.   MRN: [id number]  HPI Here with her mother to follow up on an asthma flare 2 days ago. That day she developed chest tightness, SOB, coughing and wheezing but the cough was non-productive and she had no fever. She used her albuterol nebulizer several times but did not respond so they called EMS. They gave her an Atrovent treatment and she did respond to this, so EMS do not transport her. She has been much better since then and even went back to work yesterday. According to the protocol given to her by her asthma specialist at [facility], she took 40 mg of prednisone on Thursday and again yesterday, but none yet today. She feels back to normal today, but the protocol given to her is to see her PCP within 48 hours on an attack.    Review of Systems  Constitutional: Negative.   HENT: Negative.   Eyes: Negative.   Respiratory: Positive for cough, shortness of breath and wheezing.   Cardiovascular: Negative.        Objective:   Physical Exam  Constitutional: She appears well-developed and well-nourished. No distress.  HENT:  Right Ear: External ear normal.  Left Ear: External ear normal.  Nose: Nose normal.  Mouth/Throat: Oropharynx is clear and moist.  Eyes: Conjunctivae are normal.  Pulmonary/Chest: Effort normal and breath sounds normal. No respiratory distress. She has no wheezes. She has no rales. She exhibits no tenderness.  Lymphadenopathy:    She has no cervical adenopathy.          Assessment & Plan:  She seems to be over the exacerbation now. I suggested she take 20 mg of prednisone today and again tomorrow, then she can stop this. She is scheduled to see the asthma doctor at [facility] on 10-24-14. Recheck prn

## 2014-10-07 NOTE — Progress Notes (Signed)
Pre visit review using our clinic review tool, if applicable. No additional management support is needed unless otherwise documented below in the visit note. 

## 2014-10-27 ENCOUNTER — Other Ambulatory Visit: Payer: Self-pay | Admitting: Internal Medicine

## 2014-10-27 NOTE — Telephone Encounter (Signed)
Pt requesting refill of methylphenidate 36 MG PO CR tablet

## 2014-10-30 MED ORDER — METHYLPHENIDATE HCL ER (OSM) 36 MG PO TBCR: 72.0000 mg | EXTENDED_RELEASE_TABLET | Freq: Every day | ORAL | Status: DC

## 2014-10-31 NOTE — Telephone Encounter (Signed)
Rx printed and pt's mother is aware.

## 2014-12-04 ENCOUNTER — Encounter: Payer: Federal, State, Local not specified - PPO | Admitting: Internal Medicine

## 2014-12-19 ENCOUNTER — Ambulatory Visit (INDEPENDENT_AMBULATORY_CARE_PROVIDER_SITE_OTHER): Payer: Federal, State, Local not specified - PPO | Admitting: Internal Medicine

## 2014-12-19 ENCOUNTER — Other Ambulatory Visit (HOSPITAL_COMMUNITY)
Admission: RE | Admit: 2014-12-19 | Discharge: 2014-12-19 | Disposition: A | Discharge status: Home / Self Care | Payer: Federal, State, Local not specified - PPO | Source: Ambulatory Visit | Attending: Internal Medicine | Admitting: Internal Medicine

## 2014-12-19 ENCOUNTER — Encounter: Payer: Self-pay | Admitting: Internal Medicine

## 2014-12-19 VITALS — BP 128/78 | Temp 98.1°F | Ht 66.5 in | Wt 270.0 lb

## 2014-12-19 DIAGNOSIS — F909 Attention-deficit hyperactivity disorder, unspecified type: Secondary | ICD-10-CM

## 2014-12-19 DIAGNOSIS — Z01411 Encounter for gynecological examination (general) (routine) with abnormal findings: Secondary | ICD-10-CM | POA: Diagnosis present

## 2014-12-19 DIAGNOSIS — Z01419 Encounter for gynecological examination (general) (routine) without abnormal findings: Secondary | ICD-10-CM

## 2014-12-19 DIAGNOSIS — IMO0001 Reserved for inherently not codable concepts without codable children: Secondary | ICD-10-CM

## 2014-12-19 DIAGNOSIS — R87629 Unspecified abnormal cytological findings in specimens from vagina: Secondary | ICD-10-CM

## 2014-12-19 DIAGNOSIS — Z0000 Encounter for general adult medical examination without abnormal findings: Secondary | ICD-10-CM

## 2014-12-19 DIAGNOSIS — R87612 Low grade squamous intraepithelial lesion on cytologic smear of cervix (LGSIL): Secondary | ICD-10-CM

## 2014-12-19 DIAGNOSIS — Z113 Encounter for screening for infections with a predominantly sexual mode of transmission: Secondary | ICD-10-CM | POA: Insufficient documentation

## 2014-12-19 DIAGNOSIS — Z79899 Other long term (current) drug therapy: Secondary | ICD-10-CM

## 2014-12-19 DIAGNOSIS — J4520 Mild intermittent asthma, uncomplicated: Secondary | ICD-10-CM

## 2014-12-19 DIAGNOSIS — N926 Irregular menstruation, unspecified: Secondary | ICD-10-CM

## 2014-12-19 MED ORDER — METHYLPHENIDATE HCL ER (OSM) 36 MG PO TBCR: 72.0000 mg | EXTENDED_RELEASE_TABLET | Freq: Every day | ORAL | Status: DC

## 2014-12-19 MED ORDER — METHYLPHENIDATE HCL ER (OSM) 36 MG PO TBCR: 36.0000 mg | EXTENDED_RELEASE_TABLET | Freq: Every day | ORAL | Status: DC

## 2014-12-19 NOTE — Progress Notes (Signed)
Pre visit review using our clinic review tool, if applicable. No additional management support is needed unless otherwise documented below in the visit note.  Chief Complaint  Patient presents with  . Annual Exam    repeat pap abnormal last year  refill meds    HPI: Patient  Glenda Mendez  24 y.o. comes in today for Preventive Health Care visit  Well women exam  Pap last year showed mild dysplasia and here for repeat  Denies   SA and no faginal concerns   abd pain. asthma stable now adhd : needs refill medication.  Health Maintenance  Topic Date Due  . HIV Screening  10/05/2007  . TETANUS/TDAP  10/05/2011  . INFLUENZA VACCINE  05/21/2015  . PAP SMEAR  11/28/2016   Health Maintenance Review LIFESTYLE:  Exercise:   Walk school Tobacco/ETS: no Alcohol: no Sugar beverages: Sleep: about 7  Drug use: no JOA:CZYS year  Periods  irreg  3 days  Light bleeding  To heavy  School  March  End .   Business.     ROS:  GEN/ HEENT: No fever, significant weight changes sweats headaches vision problems hearing changes, CV/ PULM; No chest pain shortness of breath cough, syncope,edema  change in exercise tolerance. GI /GU: No adominal pain, vomiting, change in bowel habits. No blood in the stool. No significant GU symptoms. SKIN/HEME: ,no acute skin rashes suspicious lesions or bleeding. No lymphadenopathy, nodules, masses.  NEURO/ PSYCH:  No neurologic signs such as weakness numbness. No depression anxiety. IMM/ Allergy: No unusual infections.  Allergy .   REST of 12 system review negative except as per HPI   Past Medical History  Diagnosis Date  . Asthma   . Pulmonary hemorrhage of fetus or newborn     43 week C-section [facility] of with birth hypoxia  . Hypoxia of newborn   . Hearing loss in right ear     with hearing aid bilateral   . Hemiparesis     rt from neonatal period  . Allergy   . ADHD (attention deficit hyperactivity disorder)   . Irregular periods 06/04/2011  . BMI  (body mass index), pediatric, 95-99% for age 37/10/2010    Is starting to gain weight again and advised and counseled today about reduction for health risk reasons. Patient is very well aware of how to do lifestyle intervention she has done this before.     No past surgical history on file.  Family History  Problem Relation Age of Onset  . Adopted: Yes  . Hypertension    . Schizophrenia Mother     with alcohol drug abuse    History   Social History  . Marital Status: Single    Spouse Name: N/A  . Number of Children: N/A  . Years of Education: N/A   Social History Main Topics  . Smoking status: Never Smoker   . Smokeless tobacco: Not on file  . Alcohol Use: No  . Drug Use: No  . Sexual Activity: Yes    Birth Control/ Protection: Pill   Other Topics Concern  . 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 [name]- pulm at [facility] now has a new pulmonary Dr. Now stable and on prn  Thyroid evaluation in the past neck evaluation within normal limits and no thyroid disease.    Outpatient Encounter Prescriptions as of 12/19/2014  Medication Sig  . albuterol (PROVENTIL HFA;VENTOLIN HFA) 108 (90 BASE) MCG/ACT inhaler Inhale 2 puffs into the lungs every 6 (six) hours as needed for wheezing or shortness of breath.  Marland Kitchen albuterol (PROVENTIL) (2.5 MG/3ML) 0.083% nebulizer solution Take 3 mLs (2.5 mg total) by nebulization every 4 (four) hours as needed for wheezing or shortness of breath.  Marland Kitchen azelastine (ASTELIN) 137 MCG/SPRAY nasal spray 1 spray by Nasal route 2 (two) times daily. Use in each nostril as directed   . fluticasone-salmeterol (ADVAIR HFA) 230-21 MCG/ACT inhaler Inhale into the lungs.  . hydrOXYzine (ATARAX) 10 MG tablet Take 10 mg by mouth 3 (three) times daily as needed for itching.   Marland Kitchen ibuprofen  (ADVIL,MOTRIN) 200 MG tablet Take 200 mg by mouth every 6 (six) hours as needed for headache.  . ketoconazole (NIZORAL) 2 % cream Apply 1 application topically 2 (two) times daily. For axillary rash  . methylphenidate 36 MG PO CR tablet Take 2 tablets (72 mg total) by mouth daily.  . methylphenidate 36 MG PO CR tablet Take 1 tablet (36 mg total) by mouth daily.  . methylphenidate 36 MG PO CR tablet Take 2 tablets (72 mg total) by mouth daily.  . montelukast (SINGULAIR) 10 MG tablet Take 10 mg by mouth at bedtime.  . norethindrone-ethinyl estradiol (JUNEL FE,GILDESS FE,LOESTRIN FE) 1-20 MG-MCG tablet Take 1 tablet by mouth daily.  Marland Kitchen omeprazole (PRILOSEC) 20 MG capsule Take 1 capsule (20 mg total) by mouth 2 (two) times daily before a meal.  . tiotropium (SPIRIVA) 18 MCG inhalation capsule Place into inhaler and inhale.  . [DISCONTINUED] methylphenidate 36 MG PO CR tablet Take 2 tablets (72 mg total) by mouth daily.  . [DISCONTINUED] methylphenidate 36 MG PO CR tablet Take 1 tablet (36 mg total) by mouth daily.  . [DISCONTINUED] methylphenidate 36 MG PO CR tablet Take 2 tablets (72 mg total) by mouth daily.  . [DISCONTINUED] predniSONE (DELTASONE) 20 MG tablet Take 3 po qd for 2 days then 2 po qd for 3 days,or as directed    EXAM:  BP 128/78 mmHg  Temp(Src) 98.1 F (36.7 C) (Oral)  Ht 5' 6.5" (1.689 m)  Wt 270 lb (122.471 kg)  BMI 42.93 kg/m2  Body mass index is 42.93 kg/(m^2).  Physical Exam: Vital signs reviewed LNL:GXQJ is a well-developed well-nourished alert cooperative    who appearsr stated age in no acute distress.  HEENT: normocephalic atraumatic , Eyes: PERRL EOM's full, conjunctiva clear, Nares: paten,t no deformity discharge or tenderness., Ears: no deformity . NECK: supple without masses, thyromegaly or bruits. CHEST/PULM:  Clear to auscultation and percussion breath sounds equal no wheeze , rales or rhonchi. No chest wall deformities or tenderness. CV: PMI is nondisplaced,  S1 S2 no gallops, murmurs, rubs. Peripheral pulses are full without delay.No JVD .  ABDOMEN: Bowel sounds normal nontender  No guard or rebound, no hepato splenomegal no CVA tenderness.  No hernia. Breast: normal by inspection . No dimpling, discharge, masses, tenderness or discharge . Extremtities:  No clubbing cyanosis or edema, no acute joint swelling or redness no focal atrophy NEURO:  Oriented x3, cranial nerves 3-12 appear to be intact, x hearing n no acute findings SKIN: No acute rashes normal turgor, color, no bruising or petechiae.some stria on abdomen no inc body hair  PSYCH: Oriented, good eye contact, no obvious depression  anxiety, cognition and judgment appear normal. LN: no cervical axillary inguinal adenopathy Pelvic: NL ext GU, labia clear without lesions or rash . Vagina no lesions .Cervix: clear  UTERUS: Neg CMT Adnexa:  clear no masses  Exam limited by  Large abd wall . PAP done creamy whit discharge    Lab Results  Component Value Date   WBC 7.1 04/30/2014   HGB 13.1 04/30/2014   HCT 41.1 04/30/2014   PLT 292 04/30/2014   GLUCOSE 128* 04/30/2014   CHOL 180 11/28/2013   TRIG 58.0 11/28/2013   HDL 72.80 11/28/2013   LDLCALC 96 11/28/2013   ALT 32 04/30/2014   AST 21 04/30/2014   NA 141 04/30/2014   K 3.6* 04/30/2014   CL 101 04/30/2014   CREATININE 0.79 04/30/2014   BUN 8 04/30/2014   CO2 24 04/30/2014   TSH 2.62 11/28/2013    ASSESSMENT AND PLAN:  Discussed the following assessment and plan:  Visit for preventive health examination - Plan: PAP [[place]]  Encounter for routine gynecological examination - Plan: PAP [[place]]  Medication management  Severe obesity (BMI >= 40) - rerefer to nutrition labs before next visit  - Plan: Amb ref to Medical Nutrition Therapy-MNT  Attention deficit hyperactivity disorder (ADHD), unspecified ADHD type - refill meds   Low grade squamous intraepithelial lesion on cytologic smear of cervix (lgsil)  Moderate  intermittent asthma, uncomplicated  Irregular periods  Abnormal vaginal Pap smear - Plan: PAP [] Repeat pap and would refer if still abnormal by protochol reportedly not hight risk otherwise  Patient Care Team: Burnis Medin, MD as PCP - General Dartha Lodge, MD as Referring Physician (Pediatrics) Derry Skill, MD (Ophthalmology) Rosealee Albee, MD as Referring Physician (Internal Medicine) Patient Instructions   We can re refer to nutrition  To helps with weight control and healthy eating  BP Readings from Last 3 Encounters:  12/19/14 140/80  10/07/14 122/74  08/29/14 128/86    bp is up some for your  age  To ensure below 140/90   .Send in readings     Take on owqn and or rov in 3 months  Wt Readings from Last 3 Encounters:  12/19/14 270 lb (122.471 kg)  10/07/14 280 lb 1.3 oz (127.043 kg)  08/29/14 279 lb (126.554 kg)       Standley Brooking. Panosh M.D.

## 2014-12-19 NOTE — Patient Instructions (Signed)
We can re refer to nutrition  To helps with weight control and healthy eating  BP Readings from Last 3 Encounters:  12/19/14 140/80  10/07/14 122/74  08/29/14 128/86    bp is up some for your  age  To ensure below 140/90   .Send in readings     Take on owqn and or rov in 3 months  Wt Readings from Last 3 Encounters:  12/19/14 270 lb (122.471 kg)  10/07/14 280 lb 1.3 oz (127.043 kg)  08/29/14 279 lb (126.554 kg)

## 2014-12-20 LAB — CYTOLOGY - PAP

## 2014-12-22 ENCOUNTER — Encounter: Payer: Self-pay | Admitting: Family Medicine

## 2015-01-01 ENCOUNTER — Telehealth: Payer: Self-pay

## 2015-01-01 ENCOUNTER — Ambulatory Visit: Payer: Federal, State, Local not specified - PPO | Admitting: *Deleted

## 2015-01-01 NOTE — Telephone Encounter (Signed)
[place] refill request for MONTELUKAST 10MG  TAB

## 2015-01-02 MED ORDER — MONTELUKAST SODIUM 10 MG PO TABS: 10.0000 mg | ORAL_TABLET | Freq: Every day | ORAL | Status: DC

## 2015-01-02 NOTE — Telephone Encounter (Signed)
Sent to the pharmacy by e-scribe. 

## 2015-02-05 ENCOUNTER — Ambulatory Visit: Payer: Federal, State, Local not specified - PPO | Admitting: Skilled Nursing Facility1

## 2015-03-17 ENCOUNTER — Encounter (HOSPITAL_COMMUNITY): Payer: Self-pay | Admitting: *Deleted

## 2015-03-17 ENCOUNTER — Emergency Department (HOSPITAL_COMMUNITY)
Admission: EM | Admit: 2015-03-17 | Discharge: 2015-03-17 | Disposition: A | Discharge status: Home / Self Care | Payer: Federal, State, Local not specified - PPO | Attending: Emergency Medicine | Admitting: Emergency Medicine

## 2015-03-17 DIAGNOSIS — J4541 Moderate persistent asthma with (acute) exacerbation: Secondary | ICD-10-CM | POA: Diagnosis not present

## 2015-03-17 DIAGNOSIS — H9191 Unspecified hearing loss, right ear: Secondary | ICD-10-CM | POA: Diagnosis not present

## 2015-03-17 DIAGNOSIS — Z79899 Other long term (current) drug therapy: Secondary | ICD-10-CM | POA: Insufficient documentation

## 2015-03-17 DIAGNOSIS — Z7951 Long term (current) use of inhaled steroids: Secondary | ICD-10-CM | POA: Insufficient documentation

## 2015-03-17 DIAGNOSIS — R0602 Shortness of breath: Secondary | ICD-10-CM | POA: Diagnosis present

## 2015-03-17 DIAGNOSIS — F909 Attention-deficit hyperactivity disorder, unspecified type: Secondary | ICD-10-CM | POA: Insufficient documentation

## 2015-03-17 MED ORDER — PREDNISONE 20 MG PO TABS: 20.0000 mg | ORAL_TABLET | Freq: Every day | ORAL | Status: DC

## 2015-03-17 NOTE — ED Provider Notes (Signed)
CSN: [id number]     Arrival date & time 03/17/15  1459 History   First MD Initiated Contact with Patient 03/17/15 1504     Chief Complaint  Patient presents with  . Shortness of Breath     (Consider location/radiation/quality/duration/timing/severity/associated sxs/prior Treatment) HPI..... Patient has long-standing history of asthma.  She sees a pulmonologist at [facility]. She had a flare up this afternoon approximately 2 PM. EMS was notified. No prodromal illnesses. She was given 2 nebulizer treatments en route and some steroid-induced. She feels much better now. Severity was moderate. No chest pain, fever, chills, productive sputum.  Past Medical History  Diagnosis Date  . Asthma   . Pulmonary hemorrhage of fetus or newborn     48 week C-section [facility] of with birth hypoxia  . Hypoxia of newborn   . Hearing loss in right ear     with hearing aid bilateral   . Hemiparesis     rt from neonatal period  . Allergy   . ADHD (attention deficit hyperactivity disorder)   . Irregular periods 06/04/2011  . BMI (body mass index), pediatric, 95-99% for age 77/10/2010    Is starting to gain weight again and advised and counseled today about reduction for health risk reasons. Patient is very well aware of how to do lifestyle intervention she has done this before.    History reviewed. No pertinent past surgical history. Family History  Problem Relation Age of Onset  . Adopted: Yes  . Hypertension    . Schizophrenia Mother     with alcohol drug abuse   History  Substance Use Topics  . Smoking status: Never Smoker   . Smokeless tobacco: Not on file  . Alcohol Use: No   OB History    No data available     Review of Systems  All other systems reviewed and are negative.     Allergies  Review of patient's allergies indicates no known allergies.  Home Medications   Prior to Admission medications   Medication Sig Start Date End Date Taking? Authorizing Provider  albuterol  (PROVENTIL HFA;VENTOLIN HFA) 108 (90 BASE) MCG/ACT inhaler Inhale 2 puffs into the lungs every 6 (six) hours as needed for wheezing or shortness of breath. 09/09/13  Yes Burnis Medin, MD  albuterol (PROVENTIL) (2.5 MG/3ML) 0.083% nebulizer solution Take 3 mLs (2.5 mg total) by nebulization every 4 (four) hours as needed for wheezing or shortness of breath. 03/12/14  Yes Tatyana Kirichenko, PA-C  fluticasone-salmeterol (ADVAIR HFA) 230-21 MCG/ACT inhaler Inhale 2 puffs into the lungs 2 (two) times daily.  05/24/14 05/24/15 Yes Historical Provider, MD  hydrOXYzine (ATARAX) 10 MG tablet Take 10 mg by mouth 3 (three) times daily as needed for itching.    Yes Historical Provider, MD  ibuprofen (ADVIL,MOTRIN) 200 MG tablet Take 200 mg by mouth every 6 (six) hours as needed for headache.   Yes Historical Provider, MD  ketoconazole (NIZORAL) 2 % cream Apply 1 application topically 2 (two) times daily. For axillary rash 06/20/14  Yes Burnis Medin, MD  methylphenidate 36 MG PO CR tablet Take 2 tablets (72 mg total) by mouth daily. 12/19/14  Yes Burnis Medin, MD  montelukast (SINGULAIR) 10 MG tablet Take 1 tablet (10 mg total) by mouth at bedtime. 01/02/15  Yes Burnis Medin, MD  norethindrone-ethinyl estradiol (JUNEL FE,GILDESS FE,LOESTRIN FE) 1-20 MG-MCG tablet Take 1 tablet by mouth daily. 07/21/14  Yes Burnis Medin, MD  omeprazole (PRILOSEC) 20 MG capsule  Take 1 capsule (20 mg total) by mouth 2 (two) times daily before a meal. Patient taking differently: Take 20 mg by mouth daily.  06/20/14  Yes Burnis Medin, MD  tiotropium (SPIRIVA) 18 MCG inhalation capsule Place 18 mcg into inhaler and inhale daily.  05/24/14 05/24/15 Yes Historical Provider, MD  predniSONE (DELTASONE) 20 MG tablet Take 1 tablet (20 mg total) by mouth daily with breakfast. 03/17/15   Nat Christen, MD   BP 120/50 mmHg  Pulse 102  Temp(Src) 98.6 F (37 C) (Oral)  Resp 16  SpO2 100% Physical Exam  Constitutional: She is oriented to person,  place, and time. She appears well-developed and well-nourished.  HENT:  Head: Normocephalic and atraumatic.  Eyes: Conjunctivae and EOM are normal. Pupils are equal, round, and reactive to light.  Neck: Normal range of motion. Neck supple.  Cardiovascular: Normal rate and regular rhythm.   Pulmonary/Chest: Effort normal.  Minimal bilateral expiratory wheezes  Abdominal: Soft. Bowel sounds are normal.  Musculoskeletal: Normal range of motion.  Neurological: She is alert and oriented to person, place, and time.  Skin: Skin is warm and dry.  Psychiatric: She has a normal mood and affect. Her behavior is normal.  Nursing note and vitals reviewed.   ED Course  Procedures (including critical care time) Labs Review Labs Reviewed - No data to display  Imaging Review No results found.   EKG Interpretation None      MDM   Final diagnoses:  Asthma, moderate persistent, with acute exacerbation    Patient feels much better after nebulizer treatment and IV stairways. Discussed clinical situation with mother and patient. She does not feel a chest x-ray is necessary today. Patient is on Spiriva and Advair at home along with rescue albuterol. Will add oral prednisone.    Nat Christen, MD 03/17/15 [phone]

## 2015-03-17 NOTE — Discharge Instructions (Signed)
Rest. Resume your normal medications. Prescription for prednisone 20 mg

## 2015-03-17 NOTE — ED Notes (Signed)
States she was in the house and started having sob. Used one of her treatments at home and was given a duo-neb per ems. States she is breathing better now.Lungs clear

## 2015-04-20 ENCOUNTER — Other Ambulatory Visit (INDEPENDENT_AMBULATORY_CARE_PROVIDER_SITE_OTHER): Payer: Federal, State, Local not specified - PPO

## 2015-04-20 DIAGNOSIS — Z0000 Encounter for general adult medical examination without abnormal findings: Secondary | ICD-10-CM | POA: Diagnosis not present

## 2015-04-20 LAB — HEPATIC FUNCTION PANEL
ALT: 24 U/L (ref 0–35)
AST: 17 U/L (ref 0–37)
Albumin: 4.1 g/dL (ref 3.5–5.2)
Alkaline Phosphatase: 128 U/L — ABNORMAL HIGH (ref 39–117)
Bilirubin, Direct: 0 mg/dL (ref 0.0–0.3)
Total Bilirubin: 0.3 mg/dL (ref 0.2–1.2)
Total Protein: 7.6 g/dL (ref 6.0–8.3)

## 2015-04-20 LAB — CBC WITH DIFFERENTIAL/PLATELET
Basophils Absolute: 0.1 10*3/uL (ref 0.0–0.1)
Basophils Relative: 0.7 % (ref 0.0–3.0)
Eosinophils Absolute: 0.2 10*3/uL (ref 0.0–0.7)
Eosinophils Relative: 1.9 % (ref 0.0–5.0)
HCT: 41.1 % (ref 36.0–46.0)
Hemoglobin: 13.2 g/dL (ref 12.0–15.0)
Lymphocytes Relative: 39.2 % (ref 12.0–46.0)
Lymphs Abs: 3.6 10*3/uL (ref 0.7–4.0)
MCHC: 32.2 g/dL (ref 30.0–36.0)
MCV: 73.2 fl — ABNORMAL LOW (ref 78.0–100.0)
Monocytes Absolute: 0.5 10*3/uL (ref 0.1–1.0)
Monocytes Relative: 5.1 % (ref 3.0–12.0)
Neutro Abs: 4.9 10*3/uL (ref 1.4–7.7)
Neutrophils Relative %: 53.1 % (ref 43.0–77.0)
Platelets: 392 10*3/uL (ref 150.0–400.0)
RBC: 5.62 Mil/uL — ABNORMAL HIGH (ref 3.87–5.11)
RDW: 15.6 % — ABNORMAL HIGH (ref 11.5–15.5)
WBC: 9.1 10*3/uL (ref 4.0–10.5)

## 2015-04-20 LAB — BASIC METABOLIC PANEL
BUN: 11 mg/dL (ref 6–23)
CO2: 22 mEq/L (ref 19–32)
Calcium: 9.7 mg/dL (ref 8.4–10.5)
Chloride: 103 mEq/L (ref 96–112)
Creatinine, Ser: 0.96 mg/dL (ref 0.40–1.20)
GFR: 93 mL/min (ref >60.00)
Glucose, Bld: 76 mg/dL (ref 70–99)
Potassium: 3.4 mEq/L — ABNORMAL LOW (ref 3.5–5.1)
Sodium: 139 mEq/L (ref 135–145)

## 2015-04-20 LAB — LIPID PANEL
Cholesterol: 171 mg/dL (ref 0–200)
HDL: 59.4 mg/dL (ref >39.00)
LDL Cholesterol: 100 mg/dL — ABNORMAL HIGH (ref 0–99)
NonHDL: 111.6
Total CHOL/HDL Ratio: 3
Triglycerides: 56 mg/dL (ref 0.0–149.0)
VLDL: 11.2 mg/dL (ref 0.0–40.0)

## 2015-04-20 LAB — TSH: TSH: 3.18 u[IU]/mL (ref 0.35–4.50)

## 2015-04-27 ENCOUNTER — Ambulatory Visit (INDEPENDENT_AMBULATORY_CARE_PROVIDER_SITE_OTHER): Payer: Federal, State, Local not specified - PPO | Admitting: Internal Medicine

## 2015-04-27 ENCOUNTER — Encounter: Payer: Self-pay | Admitting: Internal Medicine

## 2015-04-27 VITALS — BP 116/80 | HR 106 | Temp 98.5°F | Wt 275.8 lb

## 2015-04-27 DIAGNOSIS — F909 Attention-deficit hyperactivity disorder, unspecified type: Secondary | ICD-10-CM

## 2015-04-27 DIAGNOSIS — R21 Rash and other nonspecific skin eruption: Secondary | ICD-10-CM

## 2015-04-27 DIAGNOSIS — J4540 Moderate persistent asthma, uncomplicated: Secondary | ICD-10-CM

## 2015-04-27 DIAGNOSIS — R748 Abnormal levels of other serum enzymes: Secondary | ICD-10-CM | POA: Insufficient documentation

## 2015-04-27 MED ORDER — KETOCONAZOLE 2 % EX CREA: 1.0000 "application " | TOPICAL_CREAM | Freq: Two times a day (BID) | CUTANEOUS | Status: DC

## 2015-04-27 NOTE — Progress Notes (Signed)
Pre visit review using our clinic review tool, if applicable. No additional management support is needed unless otherwise documented below in the visit note.  Chief Complaint  Patient presents with  . Follow-up    ed labs asthma     HPI: Patient come in for follow up from ED visit May 28th for  Mild  mod asthma flare  On maintenece spririva and advair rx with pred burst . She is followed by Dr. Sabra Heck go at [facility] missed her last appointment but will be arranging other when soon. She has been gaining weight some because of stress eating in the evening sister is a good cook but doesn't eat healthy face of the food is not healthy. And hasn't been doing as much exercise. She also is chagrined that prednisone does help her gain weight and is very hard to get it off. She is ready to try something else.  There was a correspondence between mom Marylyn and her [facility] pulmonary specialist questioning whether VCD could be causing any of her attacks vocal cord dysfunction. She was to have an appointment at the airway clinic. Delayed  ROS: See pertinent positives and negatives per HPI. Rash under her armpits recurring using a powder itches and hurts at the same time his use ketoconazole in the past. Doesn't have it under her breast.  Past Medical History  Diagnosis Date  . Asthma   . Pulmonary hemorrhage of fetus or newborn     8 week C-section [facility] of with birth hypoxia  . Hypoxia of newborn   . Hearing loss in right ear     with hearing aid bilateral   . Hemiparesis     rt from neonatal period  . Allergy   . ADHD (attention deficit hyperactivity disorder)   . Irregular periods 06/04/2011  . BMI (body mass index), pediatric, 95-99% for age 51/10/2010    Is starting to gain weight again and advised and counseled today about reduction for health risk reasons. Patient is very well aware of how to do lifestyle intervention she has done this before.     Family History  Problem Relation Age of  Onset  . Adopted: Yes  . Hypertension    . Schizophrenia Mother     with alcohol drug abuse    History   Social History  . Marital Status: Single    Spouse Name: N/A  . Number of Children: N/A  . Years of Education: N/A   Social History Main Topics  . Smoking status: Never Smoker   . Smokeless tobacco: Not on file  . Alcohol Use: No  . Drug Use: No  . Sexual Activity: Yes    Birth Control/ Protection: Pill   Other Topics Concern  . None   Social History Narrative   Adopted and raised by family member Bobbie Virden   [employer] high school  graduate [employer] second semester   Living at home with mom and has no car   Sleep 8+ hours   Seeing Dr. [name]- pulm at [facility] now has a new pulmonary Dr. Now stable and on prn       Thyroid evaluation in the past neck evaluation within normal limits and no thyroid disease.    Outpatient Prescriptions Prior to Visit  Medication Sig Dispense Refill  . albuterol (PROVENTIL HFA;VENTOLIN HFA) 108 (90 BASE) MCG/ACT inhaler Inhale 2 puffs into the lungs every 6 (six) hours as needed for wheezing or shortness of breath. 1 Inhaler 4  .  albuterol (PROVENTIL) (2.5 MG/3ML) 0.083% nebulizer solution Take 3 mLs (2.5 mg total) by nebulization every 4 (four) hours as needed for wheezing or shortness of breath. 75 mL 2  . fluticasone-salmeterol (ADVAIR HFA) 230-21 MCG/ACT inhaler Inhale 2 puffs into the lungs 2 (two) times daily.     . hydrOXYzine (ATARAX) 10 MG tablet Take 10 mg by mouth 3 (three) times daily as needed for itching.     Marland Kitchen ibuprofen (ADVIL,MOTRIN) 200 MG tablet Take 200 mg by mouth every 6 (six) hours as needed for headache.    . methylphenidate 36 MG PO CR tablet Take 2 tablets (72 mg total) by mouth daily. 60 tablet 0  . montelukast (SINGULAIR) 10 MG tablet Take 1 tablet (10 mg total) by mouth at bedtime. 90 tablet 3  . norethindrone-ethinyl estradiol (JUNEL FE,GILDESS FE,LOESTRIN FE) 1-20 MG-MCG tablet Take 1 tablet by mouth  daily. 1 Package 11  . omeprazole (PRILOSEC) 20 MG capsule Take 1 capsule (20 mg total) by mouth 2 (two) times daily before a meal. (Patient taking differently: Take 20 mg by mouth daily. ) 60 capsule 3  . tiotropium (SPIRIVA) 18 MCG inhalation capsule Place 18 mcg into inhaler and inhale daily.     Marland Kitchen ketoconazole (NIZORAL) 2 % cream Apply 1 application topically 2 (two) times daily. For axillary rash 30 g 1  . predniSONE (DELTASONE) 20 MG tablet Take 1 tablet (20 mg total) by mouth daily with breakfast. 30 tablet 0   No facility-administered medications prior to visit.     EXAM:  BP 116/80 mmHg  Pulse 106  Temp(Src) 98.5 F (36.9 C) (Oral)  Wt 275 lb 12.8 oz (125.102 kg)  SpO2 98%  Body mass index is 43.85 kg/(m^2).  GENERAL: vitals reviewed and listed above, alert, oriented, appears well hydrated and in no acute distress she looks like she has gained weight since her last visit. HEENT: atraumatic, conjunctiva  clear, no obvious abnormalities on inspection of external nose and ears  NECK: no obvious masses on inspection palpation  LUNGS: clear to auscultation bilaterally, no wheezes, rales or rhonchi, good air movement CV: HRRR, no clubbing cyanosis or  peripheral edema nl cap refill  Skin shows some papular rash on the anterior chest also in the axilla with a few satellite lesions. There is no weeping or crusting or blisters. MS: moves all extremities without noticeable focal  abnormality PSYCH: pleasant and cooperative, no obvious depression or anxiety Wt Readings from Last 3 Encounters:  04/27/15 275 lb 12.8 oz (125.102 kg)  12/19/14 270 lb (122.471 kg)  10/07/14 280 lb 1.3 oz (127.043 kg)   Lab Results  Component Value Date   WBC 9.1 04/20/2015   HGB 13.2 04/20/2015   HCT 41.1 04/20/2015   PLT 392.0 04/20/2015   GLUCOSE 76 04/20/2015   CHOL 171 04/20/2015   TRIG 56.0 04/20/2015   HDL 59.40 04/20/2015   LDLCALC 100* 04/20/2015   ALT 24 04/20/2015   AST 17 04/20/2015     NA 139 04/20/2015   K 3.4* 04/20/2015   CL 103 04/20/2015   CREATININE 0.96 04/20/2015   BUN 11 04/20/2015   CO2 22 04/20/2015   TSH 3.18 04/20/2015    ASSESSMENT AND PLAN:  Discussed the following assessment and plan:  Moderate persistent asthma without complication - Consideration of VCD. Follow-up with her pulmonary doctor  Severe obesity (BMI >= 40) - Reviewed strategies no food watching movies referral back to nutrition discussed tracking eating sleeping activity drinking -  Plan: Amb ref to Medical Nutrition Therapy-MNT  Rash, skin - Intertriginous with monilial component. Discussed treatment prevention  Attention deficit hyperactivity disorder (ADHD), unspecified ADHD type  Abnormal alkaline phosphatase test - Mildly up rest of LFTs normal will follow Uncertain cause why her potassium is borderline low most times she is not on a diuretic or vomiting diarrhea. At this time she has no diagnosis of diabetes but is at risk and get an A1c at her next labs.  -Patient advised to return or notify health care team  if symptoms worsen ,persist or new concerns arise.  Patient Instructions  I would like you to go back to   nutritionist .  Can ask for help with strategies  when has to go on prednisone  And begin tracking  Food  Beverages and  Activity level. And sleep . You weight gain  Is unhealthy.  Also can ask [facility] team.   Meds not that helpful without other counsleing  Add antifungal cream to the yeast under the arms.   Make sure  dont take  adhd medication late.  Normal blood sugar today but at risk to get diabetes and can do hga1c in the future lab work.   Fu with dr Deeann Dowse about the  Asthma.    Standley Brooking. Anaya Bovee M.D.

## 2015-04-27 NOTE — Patient Instructions (Addendum)
I would like you to go back to   nutritionist .  Can ask for help with strategies  when has to go on prednisone  And begin tracking  Food  Beverages and  Activity level. And sleep . You weight gain  Is unhealthy.  Also can ask [facility] team.   Meds not that helpful without other counsleing  Add antifungal cream to the yeast under the arms.   Make sure  dont take  adhd medication late.  Normal blood sugar today but at risk to get diabetes and can do hga1c in the future lab work.   Fu with dr Deeann Dowse about the  Asthma.

## 2015-05-01 ENCOUNTER — Telehealth: Payer: Self-pay | Admitting: Internal Medicine

## 2015-05-01 NOTE — Telephone Encounter (Signed)
Pt needs new rx methylphenidate 36 mg

## 2015-05-02 MED ORDER — METHYLPHENIDATE HCL ER (OSM) 36 MG PO TBCR: 72.0000 mg | EXTENDED_RELEASE_TABLET | Freq: Every day | ORAL | Status: DC

## 2015-05-02 NOTE — Telephone Encounter (Signed)
Notified patient's mother to pick up at the front desk.

## 2015-05-02 NOTE — Telephone Encounter (Signed)
done

## 2015-05-19 ENCOUNTER — Encounter (HOSPITAL_COMMUNITY): Payer: Self-pay

## 2015-05-19 ENCOUNTER — Emergency Department (HOSPITAL_COMMUNITY)
Admission: EM | Admit: 2015-05-19 | Discharge: 2015-05-20 | Disposition: A | Discharge status: Home / Self Care | Payer: Federal, State, Local not specified - PPO | Attending: Emergency Medicine | Admitting: Emergency Medicine

## 2015-05-19 DIAGNOSIS — Z8742 Personal history of other diseases of the female genital tract: Secondary | ICD-10-CM | POA: Insufficient documentation

## 2015-05-19 DIAGNOSIS — F909 Attention-deficit hyperactivity disorder, unspecified type: Secondary | ICD-10-CM | POA: Diagnosis not present

## 2015-05-19 DIAGNOSIS — J45909 Unspecified asthma, uncomplicated: Secondary | ICD-10-CM | POA: Insufficient documentation

## 2015-05-19 DIAGNOSIS — R109 Unspecified abdominal pain: Secondary | ICD-10-CM

## 2015-05-19 DIAGNOSIS — Z793 Long term (current) use of hormonal contraceptives: Secondary | ICD-10-CM | POA: Insufficient documentation

## 2015-05-19 DIAGNOSIS — Z3202 Encounter for pregnancy test, result negative: Secondary | ICD-10-CM | POA: Insufficient documentation

## 2015-05-19 DIAGNOSIS — Z79899 Other long term (current) drug therapy: Secondary | ICD-10-CM | POA: Insufficient documentation

## 2015-05-19 DIAGNOSIS — H9191 Unspecified hearing loss, right ear: Secondary | ICD-10-CM | POA: Diagnosis not present

## 2015-05-19 DIAGNOSIS — R1011 Right upper quadrant pain: Secondary | ICD-10-CM | POA: Diagnosis present

## 2015-05-19 NOTE — ED Notes (Signed)
PER EMS: Pt from home, reports RUQ abdominal pain onset tonight around 2230. She became anxious and was hyperventilating upon EMS arrival, RR-40. She was placed on 8L NRB and she was able to calm down. Lungs sounds clear, equal and 99%, 138 palpated, RR-16 after she calmed down. She still reports abdominal pain. Pt ambulatory to room upon arrival, NAD, room air.

## 2015-05-20 ENCOUNTER — Emergency Department (HOSPITAL_COMMUNITY): Payer: Federal, State, Local not specified - PPO

## 2015-05-20 LAB — COMPREHENSIVE METABOLIC PANEL
ALT: 30 U/L (ref 14–54)
AST: 24 U/L (ref 15–41)
Albumin: 3.6 g/dL (ref 3.5–5.0)
Alkaline Phosphatase: 118 U/L (ref 38–126)
Anion gap: 9 (ref 5–15)
BUN: 10 mg/dL (ref 6–20)
CO2: 25 mmol/L (ref 22–32)
Calcium: 9.2 mg/dL (ref 8.9–10.3)
Chloride: 104 mmol/L (ref 101–111)
Creatinine, Ser: 0.95 mg/dL (ref 0.44–1.00)
GFR calc Af Amer: >60 mL/min (ref >60)
GFR calc non Af Amer: >60 mL/min (ref >60)
Glucose, Bld: 84 mg/dL (ref 65–99)
Potassium: 3.6 mmol/L (ref 3.5–5.1)
Sodium: 138 mmol/L (ref 135–145)
Total Bilirubin: 0.3 mg/dL (ref 0.3–1.2)
Total Protein: 7 g/dL (ref 6.5–8.1)

## 2015-05-20 LAB — URINALYSIS, ROUTINE W REFLEX MICROSCOPIC
Bilirubin Urine: NEGATIVE
Glucose, UA: NEGATIVE mg/dL
Hgb urine dipstick: NEGATIVE
Ketones, ur: NEGATIVE mg/dL
Leukocytes, UA: NEGATIVE
Nitrite: NEGATIVE
Protein, ur: NEGATIVE mg/dL
Specific Gravity, Urine: 1.02 (ref 1.005–1.030)
Urobilinogen, UA: 0.2 mg/dL (ref 0.0–1.0)
pH: 5.5 (ref 5.0–8.0)

## 2015-05-20 LAB — CBC
HCT: 37.2 % (ref 36.0–46.0)
Hemoglobin: 12 g/dL (ref 12.0–15.0)
MCH: 23.6 pg — ABNORMAL LOW (ref 26.0–34.0)
MCHC: 32.3 g/dL (ref 30.0–36.0)
MCV: 73.2 fL — ABNORMAL LOW (ref 78.0–100.0)
Platelets: 382 10*3/uL (ref 150–400)
RBC: 5.08 MIL/uL (ref 3.87–5.11)
RDW: 14.9 % (ref 11.5–15.5)
WBC: 9.3 10*3/uL (ref 4.0–10.5)

## 2015-05-20 LAB — POC URINE PREG, ED: Preg Test, Ur: NEGATIVE

## 2015-05-20 LAB — LIPASE, BLOOD: Lipase: 46 U/L (ref 22–51)

## 2015-05-20 MED ORDER — SODIUM CHLORIDE 0.9 % IV BOLUS (SEPSIS)
1000.0000 mL | Freq: Once | INTRAVENOUS | Status: AC
  Administered 2015-05-20: 1000 mL via INTRAVENOUS

## 2015-05-20 MED ORDER — ONDANSETRON HCL 4 MG/2ML IJ SOLN
4.0000 mg | Freq: Once | INTRAMUSCULAR | Status: AC
  Administered 2015-05-20: 4 mg via INTRAVENOUS
  Filled 2015-05-20: qty 2

## 2015-05-20 MED ORDER — KETOROLAC TROMETHAMINE 30 MG/ML IJ SOLN
30.0000 mg | Freq: Once | INTRAMUSCULAR | Status: AC
  Administered 2015-05-20: 30 mg via INTRAVENOUS
  Filled 2015-05-20: qty 1

## 2015-05-20 MED ORDER — MORPHINE SULFATE 4 MG/ML IJ SOLN
4.0000 mg | Freq: Once | INTRAMUSCULAR | Status: AC
  Administered 2015-05-20: 4 mg via INTRAVENOUS
  Filled 2015-05-20: qty 1

## 2015-05-20 MED ORDER — TRAMADOL HCL 50 MG PO TABS: 50.0000 mg | ORAL_TABLET | Freq: Four times a day (QID) | ORAL | Status: DC | PRN

## 2015-05-20 NOTE — ED Provider Notes (Signed)
CSN: [id number]     Arrival date & time 05/19/15  2349 History   First MD Initiated Contact with Patient 05/19/15 2353     Chief Complaint  Patient presents with  . Abdominal Pain     (Consider location/radiation/quality/duration/timing/severity/associated sxs/prior Treatment) HPI Comments: Patient is a 24 year old female past medical history significant for asthma, hemiparesis, ADHD presenting to the emergency department for evaluation of right upper quadrant pain that began around 10:30 this evening. Patient states she felt well, had pizza for dinner several hours afterwards began to have pain. She states that she bent over to cramps living felt a pull in her right upper quadrant and has had significant pain to the area with radiation up into her chest since then. She states she had an anxiety attack and had a difficult time breathing, not has resolved. Patient also notes that she has had increased right shoulder pain for the last 3 days without fall or injury, she is followed by Dr. Marlou Sa for chronic right-sided shoulder weakness since childhood. No changes. No modifying factors identified.  Patient is a 24 y.o. female presenting with abdominal pain. The history is provided by the patient.  Abdominal Pain Pain location:  RUQ Pain radiates to:  R shoulder Pain severity:  Severe Onset quality:  Sudden Timing:  Constant Progression:  Unchanged Chronicity:  New Context: not previous surgeries   Relieved by:  None tried Worsened by:  Nothing tried Ineffective treatments:  None tried Associated symptoms: no cough, no diarrhea, no fever, no nausea and no vomiting   Risk factors: has not had multiple surgeries     Past Medical History  Diagnosis Date  . Asthma   . Pulmonary hemorrhage of fetus or newborn     10 week C-section [facility] of with birth hypoxia  . Hypoxia of newborn   . Hearing loss in right ear     with hearing aid bilateral   . Hemiparesis     rt from neonatal period  .  Allergy   . ADHD (attention deficit hyperactivity disorder)   . Irregular periods 06/04/2011  . BMI (body mass index), pediatric, 95-99% for age 16/10/2010    Is starting to gain weight again and advised and counseled today about reduction for health risk reasons. Patient is very well aware of how to do lifestyle intervention she has done this before.    History reviewed. No pertinent past surgical history. Family History  Problem Relation Age of Onset  . Adopted: Yes  . Hypertension    . Schizophrenia Mother     with alcohol drug abuse   History  Substance Use Topics  . Smoking status: Never Smoker   . Smokeless tobacco: Not on file  . Alcohol Use: No   OB History    No data available     Review of Systems  Constitutional: Negative for fever.  Respiratory: Negative for cough.   Gastrointestinal: Positive for abdominal pain. Negative for nausea, vomiting and diarrhea.  All other systems reviewed and are negative.     Allergies  Review of patient's allergies indicates no known allergies.  Home Medications   Prior to Admission medications   Medication Sig Start Date End Date Taking? Authorizing Provider  albuterol (PROVENTIL HFA;VENTOLIN HFA) 108 (90 BASE) MCG/ACT inhaler Inhale 2 puffs into the lungs every 6 (six) hours as needed for wheezing or shortness of breath. 09/09/13   Burnis Medin, MD  albuterol (PROVENTIL) (2.5 MG/3ML) 0.083% nebulizer solution Take 3 mLs (2.5  mg total) by nebulization every 4 (four) hours as needed for wheezing or shortness of breath. 03/12/14   Tatyana Kirichenko, PA-C  fluticasone-salmeterol (ADVAIR HFA) 230-21 MCG/ACT inhaler Inhale 2 puffs into the lungs 2 (two) times daily.  05/24/14 05/24/15  Historical Provider, MD  hydrOXYzine (ATARAX) 10 MG tablet Take 10 mg by mouth 3 (three) times daily as needed for itching.     Historical Provider, MD  ibuprofen (ADVIL,MOTRIN) 200 MG tablet Take 200 mg by mouth every 6 (six) hours as needed for headache.     Historical Provider, MD  ketoconazole (NIZORAL) 2 % cream Apply 1 application topically 2 (two) times daily. For axillary rash 04/27/15   Burnis Medin, MD  methylphenidate 36 MG PO CR tablet Take 2 tablets (72 mg total) by mouth daily. 05/02/15   Laurey Morale, MD  montelukast (SINGULAIR) 10 MG tablet Take 1 tablet (10 mg total) by mouth at bedtime. 01/02/15   Burnis Medin, MD  norethindrone-ethinyl estradiol (JUNEL FE,GILDESS FE,LOESTRIN FE) 1-20 MG-MCG tablet Take 1 tablet by mouth daily. 07/21/14   Burnis Medin, MD  omeprazole (PRILOSEC) 20 MG capsule Take 1 capsule (20 mg total) by mouth 2 (two) times daily before a meal. Patient taking differently: Take 20 mg by mouth daily.  06/20/14   Burnis Medin, MD  tiotropium (SPIRIVA) 18 MCG inhalation capsule Place 18 mcg into inhaler and inhale daily.  05/24/14 05/24/15  Historical Provider, MD  traMADol (ULTRAM) 50 MG tablet Take 1 tablet (50 mg total) by mouth every 6 (six) hours as needed. 05/20/15   Perry Molla, PA-C   BP 119/66 mmHg  Pulse 92  Temp(Src) 99.3 F (37.4 C) (Oral)  Resp 16  Ht 5\' 7"  (1.702 m)  Wt 270 lb (122.471 kg)  BMI 42.28 kg/m2  SpO2 97%  LMP 05/07/2015 Physical Exam  Constitutional: She is oriented to person, place, and time. She appears well-developed and well-nourished. No distress.  HENT:  Head: Normocephalic and atraumatic.  Right Ear: External ear normal.  Left Ear: External ear normal.  Nose: Nose normal.  Eyes: Conjunctivae are normal.  Neck: Neck supple.  Cardiovascular: Normal rate, regular rhythm, normal heart sounds and intact distal pulses.   Pulmonary/Chest: Effort normal and breath sounds normal. No respiratory distress.  Abdominal: Soft. Bowel sounds are normal. She exhibits no distension. There is tenderness in the right upper quadrant. There is no rigidity, no rebound and no guarding.  Musculoskeletal: Normal range of motion. She exhibits no edema.  Increased pain in right shoulder with  overhead activities. No deformities noted.  Neurological: She is alert and oriented to person, place, and time.  Skin: Skin is warm and dry. She is not diaphoretic.  Nursing note and vitals reviewed.   ED Course  Procedures (including critical care time) Medications  sodium chloride 0.9 % bolus 1,000 mL (0 mLs Intravenous Stopped 05/20/15 0244)  ondansetron (ZOFRAN) injection 4 mg (4 mg Intravenous Given 05/20/15 0023)  morphine 4 MG/ML injection 4 mg (4 mg Intravenous Given 05/20/15 0024)  ketorolac (TORADOL) 30 MG/ML injection 30 mg (30 mg Intravenous Given 05/20/15 0249)    Labs Review Labs Reviewed  CBC - Abnormal; Notable for the following:    MCV 73.2 (*)    MCH 23.6 (*)    All other components within normal limits  LIPASE, BLOOD  COMPREHENSIVE METABOLIC PANEL  URINALYSIS, ROUTINE W REFLEX MICROSCOPIC (NOT AT [facility])  POC URINE PREG, ED    Imaging Review Dg  Chest 2 View  05/20/2015   CLINICAL DATA:  Right upper quadrant pain radiating to the chest and shoulder. Two days duration. Pleuritic.  EXAM: CHEST  2 VIEW  COMPARISON:  04/30/2014  FINDINGS: The heart size and mediastinal contours are within normal limits. Both lungs are clear. The visualized skeletal structures are unremarkable.  IMPRESSION: No active cardiopulmonary disease.   Electronically Signed   By: Andreas Newport M.D.   On: 05/20/2015 02:08   US Abdomen Limited  05/20/2015   CLINICAL DATA:  Right upper quadrant pain since last night.  EXAM: US ABDOMEN LIMITED - RIGHT UPPER QUADRANT  COMPARISON:  None.  FINDINGS: Gallbladder:  Only partially distended. No gallstones or wall thickening visualized. Wall thickness of 1.9 mm. No sonographic Murphy sign noted.  Common bile duct:  Diameter: 2 mm.  Liver:  No focal lesion identified. Within normal limits in parenchymal echogenicity. Normal directional flow in the main portal vein.  IMPRESSION: Normal right upper quadrant ultrasound.   Electronically Signed   By: Jeb Levering M.D.   On: 05/20/2015 01:14     EKG Interpretation None      MDM   Final diagnoses:  Abdominal pain in female    Filed Vitals:   05/20/15 0315  BP: 119/66  Pulse: 92  Temp:   Resp: 16   Afebrile, NAD, non-toxic appearing, AAOx4.   I have reviewed nursing notes, vital signs, and all lab and all imaging results as noted above.  Patient is nontoxic, nonseptic appearing, in no apparent distress.  Patient's pain and other symptoms adequately managed in emergency department.  Fluid bolus given.  Labs, imaging and vitals reviewed.  Patient does not meet the SIRS or Sepsis criteria.  On repeat exam patient does not have a surgical abdomen and there are no peritoneal signs.  No indication of appendicitis, bowel obstruction, bowel perforation, cholecystitis, diverticulitis, or ectopic pregnancy.  CXR unremarkable. Low clinical suspicion for cardiac or pulmonary cause of pain. Patient discharged home with symptomatic treatment and given strict instructions for follow-up with their primary care physician.  I have also discussed reasons to return immediately to the ER.  Patient expresses understanding and agrees with plan.        Baron Sane, PA-C 05/20/15 0602  Jola Schmidt, MD 05/20/15 [phone]

## 2015-05-20 NOTE — ED Notes (Signed)
Pt given [place] sandwich and sprite to eat and drink.

## 2015-05-20 NOTE — Discharge Instructions (Signed)
Please follow up with your primary care physician in 1-2 days. If you do not have one please call the [place] number listed above.Please take pain medication and/or muscle relaxants as prescribed and as needed for pain. Please do not drive on narcotic pain medication or on muscle relaxants. Please read all discharge instructions and return precautions.    Abdominal Pain Many things can cause abdominal pain. Usually, abdominal pain is not caused by a disease and will improve without treatment. It can often be observed and treated at home. Your health care provider will do a physical exam and possibly order blood tests and X-rays to help determine the seriousness of your pain. However, in many cases, more time must pass before a clear cause of the pain can be found. Before that point, your health care provider may not know if you need more testing or further treatment. HOME CARE INSTRUCTIONS  Monitor your abdominal pain for any changes. The following actions may help to alleviate any discomfort you are experiencing:  Only take over-the-counter or prescription medicines as directed by your health care provider.  Do not take laxatives unless directed to do so by your health care provider.  Try a clear liquid diet (broth, tea, or water) as directed by your health care provider. Slowly move to a bland diet as tolerated. SEEK MEDICAL CARE IF:  You have unexplained abdominal pain.  You have abdominal pain associated with nausea or diarrhea.  You have pain when you urinate or have a bowel movement.  You experience abdominal pain that wakes you in the night.  You have abdominal pain that is worsened or improved by eating food.  You have abdominal pain that is worsened with eating fatty foods.  You have a fever. SEEK IMMEDIATE MEDICAL CARE IF:   Your pain does not go away within 2 hours.  You keep throwing up (vomiting).  Your pain is felt only in portions of the  abdomen, such as the right side or the left lower portion of the abdomen.  You pass bloody or black tarry stools. MAKE SURE YOU:  Understand these instructions.   Will watch your condition.   Will get help right away if you are not doing well or get worse.  Document Released: 07/16/2005 Document Revised: 10/11/2013 Document Reviewed: 06/15/2013 [facility] Patient Information 2015 [place], [place]. This information is not intended to replace advice given to you by your health care provider. Make sure you discuss any questions you have with your health care provider.

## 2015-05-31 ENCOUNTER — Emergency Department (HOSPITAL_COMMUNITY): Payer: Federal, State, Local not specified - PPO

## 2015-05-31 ENCOUNTER — Emergency Department (HOSPITAL_COMMUNITY)
Admission: EM | Admit: 2015-05-31 | Discharge: 2015-05-31 | Disposition: A | Discharge status: Home / Self Care | Payer: Federal, State, Local not specified - PPO | Attending: Emergency Medicine | Admitting: Emergency Medicine

## 2015-05-31 ENCOUNTER — Encounter (HOSPITAL_COMMUNITY): Payer: Self-pay | Admitting: Emergency Medicine

## 2015-05-31 DIAGNOSIS — F909 Attention-deficit hyperactivity disorder, unspecified type: Secondary | ICD-10-CM | POA: Insufficient documentation

## 2015-05-31 DIAGNOSIS — R1011 Right upper quadrant pain: Secondary | ICD-10-CM

## 2015-05-31 DIAGNOSIS — Z7951 Long term (current) use of inhaled steroids: Secondary | ICD-10-CM | POA: Insufficient documentation

## 2015-05-31 DIAGNOSIS — Z3202 Encounter for pregnancy test, result negative: Secondary | ICD-10-CM | POA: Insufficient documentation

## 2015-05-31 DIAGNOSIS — H9191 Unspecified hearing loss, right ear: Secondary | ICD-10-CM | POA: Diagnosis not present

## 2015-05-31 DIAGNOSIS — Z79899 Other long term (current) drug therapy: Secondary | ICD-10-CM | POA: Diagnosis not present

## 2015-05-31 DIAGNOSIS — J45901 Unspecified asthma with (acute) exacerbation: Secondary | ICD-10-CM | POA: Diagnosis not present

## 2015-05-31 DIAGNOSIS — Z8742 Personal history of other diseases of the female genital tract: Secondary | ICD-10-CM | POA: Insufficient documentation

## 2015-05-31 DIAGNOSIS — R109 Unspecified abdominal pain: Secondary | ICD-10-CM | POA: Diagnosis present

## 2015-05-31 LAB — URINALYSIS, ROUTINE W REFLEX MICROSCOPIC
Bilirubin Urine: NEGATIVE
Glucose, UA: NEGATIVE mg/dL
Ketones, ur: NEGATIVE mg/dL
Nitrite: NEGATIVE
Protein, ur: NEGATIVE mg/dL
Specific Gravity, Urine: 1.024 (ref 1.005–1.030)
Urobilinogen, UA: 0.2 mg/dL (ref 0.0–1.0)
pH: 6 (ref 5.0–8.0)

## 2015-05-31 LAB — COMPREHENSIVE METABOLIC PANEL
ALT: 43 U/L (ref 14–54)
AST: 29 U/L (ref 15–41)
Albumin: 3.7 g/dL (ref 3.5–5.0)
Alkaline Phosphatase: 116 U/L (ref 38–126)
Anion gap: 12 (ref 5–15)
BUN: 5 mg/dL — ABNORMAL LOW (ref 6–20)
CO2: 22 mmol/L (ref 22–32)
Calcium: 9 mg/dL (ref 8.9–10.3)
Chloride: 106 mmol/L (ref 101–111)
Creatinine, Ser: 0.91 mg/dL (ref 0.44–1.00)
GFR calc Af Amer: >60 mL/min (ref >60)
GFR calc non Af Amer: >60 mL/min (ref >60)
Glucose, Bld: 99 mg/dL (ref 65–99)
Potassium: 3.2 mmol/L — ABNORMAL LOW (ref 3.5–5.1)
Sodium: 140 mmol/L (ref 135–145)
Total Bilirubin: 0.4 mg/dL (ref 0.3–1.2)
Total Protein: 7.1 g/dL (ref 6.5–8.1)

## 2015-05-31 LAB — CBC WITH DIFFERENTIAL/PLATELET
Basophils Absolute: 0 10*3/uL (ref 0.0–0.1)
Basophils Relative: 0 % (ref 0–1)
Eosinophils Absolute: 0.2 10*3/uL (ref 0.0–0.7)
Eosinophils Relative: 2 % (ref 0–5)
HCT: 36.4 % (ref 36.0–46.0)
Hemoglobin: 11.6 g/dL — ABNORMAL LOW (ref 12.0–15.0)
Lymphocytes Relative: 44 % (ref 12–46)
Lymphs Abs: 4.3 10*3/uL — ABNORMAL HIGH (ref 0.7–4.0)
MCH: 23.4 pg — ABNORMAL LOW (ref 26.0–34.0)
MCHC: 31.9 g/dL (ref 30.0–36.0)
MCV: 73.4 fL — ABNORMAL LOW (ref 78.0–100.0)
Monocytes Absolute: 0.7 10*3/uL (ref 0.1–1.0)
Monocytes Relative: 7 % (ref 3–12)
Neutro Abs: 4.6 10*3/uL (ref 1.7–7.7)
Neutrophils Relative %: 47 % (ref 43–77)
Platelets: 399 10*3/uL (ref 150–400)
RBC: 4.96 MIL/uL (ref 3.87–5.11)
RDW: 14.7 % (ref 11.5–15.5)
WBC: 9.8 10*3/uL (ref 4.0–10.5)

## 2015-05-31 LAB — URINE MICROSCOPIC-ADD ON

## 2015-05-31 LAB — LIPASE, BLOOD: Lipase: 43 U/L (ref 22–51)

## 2015-05-31 LAB — POC URINE PREG, ED: Preg Test, Ur: NEGATIVE

## 2015-05-31 MED ORDER — MORPHINE SULFATE 4 MG/ML IJ SOLN
4.0000 mg | Freq: Once | INTRAMUSCULAR | Status: AC
  Administered 2015-05-31: 4 mg via INTRAVENOUS
  Filled 2015-05-31: qty 1

## 2015-05-31 MED ORDER — KETOROLAC TROMETHAMINE 30 MG/ML IJ SOLN
30.0000 mg | Freq: Once | INTRAMUSCULAR | Status: AC
  Administered 2015-05-31: 30 mg via INTRAVENOUS
  Filled 2015-05-31: qty 1

## 2015-05-31 NOTE — ED Notes (Signed)
Patient arrived to ED via [facility]. EMS reports that patient had complained of asthma and right side pain. Reports she has had asthma & the right sided pain since she was very young. Mother of patient reports that she was diagnosed with vocal cord dysfunction recently. Patient reports that she had taken 3 nebulizer treatments prior to EMS arrival. Patient slept en route to ED. VSS. BP 124/70, Pulse 90, 98% on room air. Lungs clear. No IV access.

## 2015-05-31 NOTE — ED Notes (Signed)
Patient transported out of ED via wheelchair at time of discharge. Discharge instructions reviewed with patient/mother. No acute distress noted.

## 2015-05-31 NOTE — ED Notes (Signed)
Patient not in room at this time. Taken to [employer] [facility] via [facility]. Mother in room waiting for patient to return.

## 2015-05-31 NOTE — Discharge Instructions (Signed)
Abdominal Pain, Women Glenda Mendez, your blood work and imaging were normal today. Take ibuprofen as needed for pain control. See your primary care physician within 3 days for close follow-up. Thank you. Abdominal (stomach, pelvic, or belly) pain can be caused by many things. It is important to tell your doctor:  The location of the pain.  Does it come and go or is it present all the time?  Are there things that start the pain (eating certain foods, exercise)?  Are there other symptoms associated with the pain (fever, nausea, vomiting, diarrhea)? All of this is helpful to know when trying to find the cause of the pain. CAUSES   Stomach: virus or bacteria infection, or ulcer.  Intestine: appendicitis (inflamed appendix), regional ileitis (Crohn's disease), ulcerative colitis (inflamed colon), irritable bowel syndrome, diverticulitis (inflamed diverticulum of the colon), or cancer of the stomach or intestine.  Gallbladder disease or stones in the gallbladder.  Kidney disease, kidney stones, or infection.  Pancreas infection or cancer.  Fibromyalgia (pain disorder).  Diseases of the female organs:  Uterus: fibroid (non-cancerous) tumors or infection.  Fallopian tubes: infection or tubal pregnancy.  Ovary: cysts or tumors.  Pelvic adhesions (scar tissue).  Endometriosis (uterus lining tissue growing in the pelvis and on the pelvic organs).  Pelvic congestion syndrome (female organs filling up with blood just before the menstrual period).  Pain with the menstrual period.  Pain with ovulation (producing an egg).  Pain with an IUD (intrauterine device, birth control) in the uterus.  Cancer of the female organs.  Functional pain (pain not caused by a disease, may improve without treatment).  Psychological pain.  Depression. DIAGNOSIS  Your doctor will decide the seriousness of your pain by doing an examination.  Blood tests.  X-rays.  Ultrasound.  CT scan  (computed tomography, special type of X-ray).  MRI (magnetic resonance imaging).  Cultures, for infection.  Barium enema (dye inserted in the large intestine, to better view it with X-rays).  Colonoscopy (looking in intestine with a lighted tube).  Laparoscopy (minor surgery, looking in abdomen with a lighted tube).  Major abdominal exploratory surgery (looking in abdomen with a large incision). TREATMENT  The treatment will depend on the cause of the pain.   Many cases can be observed and treated at home.  Over-the-counter medicines recommended by your caregiver.  Prescription medicine.  Antibiotics, for infection.  Birth control pills, for painful periods or for ovulation pain.  Hormone treatment, for endometriosis.  Nerve blocking injections.  Physical therapy.  Antidepressants.  Counseling with a psychologist or psychiatrist.  Minor or major surgery. HOME CARE INSTRUCTIONS   Do not take laxatives, unless directed by your caregiver.  Take over-the-counter pain medicine only if ordered by your caregiver. Do not take aspirin because it can cause an upset stomach or bleeding.  Try a clear liquid diet (broth or water) as ordered by your caregiver. Slowly move to a bland diet, as tolerated, if the pain is related to the stomach or intestine.  Have a thermometer and take your temperature several times a day, and record it.  Bed rest and sleep, if it helps the pain.  Avoid sexual intercourse, if it causes pain.  Avoid stressful situations.  Keep your follow-up appointments and tests, as your caregiver orders.  If the pain does not go away with medicine or surgery, you may try:  Acupuncture.  Relaxation exercises (yoga, meditation).  Group therapy.  Counseling. SEEK MEDICAL CARE IF:   You notice certain foods  cause stomach pain.  Your home care treatment is not helping your pain.  You need stronger pain medicine.  You want your IUD removed.  You  feel faint or lightheaded.  You develop nausea and vomiting.  You develop a rash.  You are having side effects or an allergy to your medicine. SEEK IMMEDIATE MEDICAL CARE IF:   Your pain does not go away or gets worse.  You have a fever.  Your pain is felt only in portions of the abdomen. The right side could possibly be appendicitis. The left lower portion of the abdomen could be colitis or diverticulitis.  You are passing blood in your stools (bright red or black tarry stools, with or without vomiting).  You have blood in your urine.  You develop chills, with or without a fever.  You pass out. MAKE SURE YOU:   Understand these instructions.  Will watch your condition.  Will get help right away if you are not doing well or get worse. Document Released: 08/03/2007 Document Revised: 02/20/2014 Document Reviewed: 08/23/2009 [facility] Patient Information 2015 [place], [place]. This information is not intended to replace advice given to you by your health care provider. Make sure you discuss any questions you have with your health care provider. Shoulder Pain The shoulder is the joint that connects your arm to your body. Muscles and band-like tissues that connect bones to muscles (tendons) hold the joint together. Shoulder pain is felt if an injury or medical problem affects one or more parts of the shoulder. HOME CARE   Put ice on the sore area.  Put ice in a plastic bag.  Place a towel between your skin and the bag.  Leave the ice on for 15-20 minutes, 03-04 times a day for the first 2 days.  Stop using cold packs if they do not help with the pain.  If you were given something to keep your shoulder from moving (sling; shoulder immobilizer), wear it as told. Only take it off to shower or bathe.  Move your arm as little as possible, but keep your hand moving to prevent puffiness (swelling).  Squeeze a soft ball or foam pad as much as possible to help prevent  swelling.  Take medicine as told by your doctor. GET HELP IF:  You have progressing new pain in your arm, hand, or fingers.  Your hand or fingers get cold.  Your medicine does not help lessen your pain. GET HELP RIGHT AWAY IF:   Your arm, hand, or fingers are numb or tingling.  Your arm, hand, or fingers are puffy (swollen), painful, or turn white or blue. MAKE SURE YOU:   Understand these instructions.  Will watch your condition.  Will get help right away if you are not doing well or get worse. Document Released: 03/24/2008 Document Revised: 02/20/2014 Document Reviewed: 04/19/2012 [facility] Patient Information 2015 [place], [place]. This information is not intended to replace advice given to you by your health care provider. Make sure you discuss any questions you have with your health care provider.

## 2015-05-31 NOTE — ED Notes (Signed)
Phlebotomy at bedside to collect labs.

## 2015-05-31 NOTE — ED Provider Notes (Signed)
CSN: [id number]     Arrival date & time 05/31/15  5852 History  This chart was scribed for Glenda Balls, MD by Evelene Croon, ED Scribe. This patient was seen in room B17C/B17C and the patient's care was started 3:51 AM.    Chief Complaint  Patient presents with  . Asthma  . Pain    Right side pain    The history is provided by the patient. No language interpreter was used.     HPI Comments:  Glenda Mendez is a 24 y.o. female who presents to the Emergency Department complaining of sharp right sided abdominal pain  for ~1 week, which has  progressively worsened since onset. She denies vomiting, diarrhea, and urinary symptoms. She also denies exacerbation of her pain with food. She reports right shoulder pain that also started ~ 1 week ago. No alleviating factors noted.  Pt has a history of asthma she also complains of SOB and central CP. She reports receiving 3 breathing treatments PTA with full improvement back to her baseline.    Past Medical History  Diagnosis Date  . Asthma   . Pulmonary hemorrhage of fetus or newborn     84 week C-section [facility] of with birth hypoxia  . Hypoxia of newborn   . Hearing loss in right ear     with hearing aid bilateral   . Hemiparesis     rt from neonatal period  . Allergy   . ADHD (attention deficit hyperactivity disorder)   . Irregular periods 06/04/2011  . BMI (body mass index), pediatric, 95-99% for age 20/10/2010    Is starting to gain weight again and advised and counseled today about reduction for health risk reasons. Patient is very well aware of how to do lifestyle intervention she has done this before.    No past surgical history on file. Family History  Problem Relation Age of Onset  . Adopted: Yes  . Hypertension    . Schizophrenia Mother     with alcohol drug abuse   Social History  Substance Use Topics  . Smoking status: Never Smoker   . Smokeless tobacco: Not on file  . Alcohol Use: No   OB History    No data available      Review of Systems  A complete 10 system review of systems was obtained and all systems are negative except as noted in the HPI and PMH.     Allergies  Review of patient's allergies indicates no known allergies.  Home Medications   Prior to Admission medications   Medication Sig Start Date End Date Taking? Authorizing Provider  albuterol (PROVENTIL HFA;VENTOLIN HFA) 108 (90 BASE) MCG/ACT inhaler Inhale 2 puffs into the lungs every 6 (six) hours as needed for wheezing or shortness of breath. 09/09/13   Burnis Medin, MD  albuterol (PROVENTIL) (2.5 MG/3ML) 0.083% nebulizer solution Take 3 mLs (2.5 mg total) by nebulization every 4 (four) hours as needed for wheezing or shortness of breath. 03/12/14   Tatyana Kirichenko, PA-C  fluticasone-salmeterol (ADVAIR HFA) 230-21 MCG/ACT inhaler Inhale 2 puffs into the lungs 2 (two) times daily.  05/24/14 05/24/15  Historical Provider, MD  hydrOXYzine (ATARAX) 10 MG tablet Take 10 mg by mouth 3 (three) times daily as needed for itching.     Historical Provider, MD  ibuprofen (ADVIL,MOTRIN) 200 MG tablet Take 200 mg by mouth every 6 (six) hours as needed for headache.    Historical Provider, MD  ketoconazole (NIZORAL) 2 % cream Apply  1 application topically 2 (two) times daily. For axillary rash 04/27/15   Burnis Medin, MD  methylphenidate 36 MG PO CR tablet Take 2 tablets (72 mg total) by mouth daily. 05/02/15   Laurey Morale, MD  montelukast (SINGULAIR) 10 MG tablet Take 1 tablet (10 mg total) by mouth at bedtime. 01/02/15   Burnis Medin, MD  norethindrone-ethinyl estradiol (JUNEL FE,GILDESS FE,LOESTRIN FE) 1-20 MG-MCG tablet Take 1 tablet by mouth daily. 07/21/14   Burnis Medin, MD  omeprazole (PRILOSEC) 20 MG capsule Take 1 capsule (20 mg total) by mouth 2 (two) times daily before a meal. Patient taking differently: Take 20 mg by mouth daily.  06/20/14   Burnis Medin, MD  tiotropium (SPIRIVA) 18 MCG inhalation capsule Place 18 mcg into inhaler and  inhale daily.  05/24/14 05/24/15  Historical Provider, MD  traMADol (ULTRAM) 50 MG tablet Take 1 tablet (50 mg total) by mouth every 6 (six) hours as needed. 05/20/15   Baron Sane, PA-C   LMP 05/07/2015 Physical Exam  Constitutional: She is oriented to person, place, and time. She appears well-developed and well-nourished. No distress.  HENT:  Head: Normocephalic and atraumatic.  Nose: Nose normal.  Mouth/Throat: Oropharynx is clear and moist. No oropharyngeal exudate.  Eyes: Conjunctivae and EOM are normal. Pupils are equal, round, and reactive to light. No scleral icterus.  Neck: Normal range of motion. Neck supple. No JVD present. No tracheal deviation present. No thyromegaly present.  Cardiovascular: Normal rate, regular rhythm and normal heart sounds.  Exam reveals no gallop and no friction rub.   No murmur heard. Pulmonary/Chest: Effort normal and breath sounds normal. No respiratory distress. She has no wheezes. She exhibits no tenderness.  Abdominal: Soft. Bowel sounds are normal. She exhibits no distension and no mass. There is tenderness. There is no rebound and no guarding.  RUQ TTP Positive murphys sign   Musculoskeletal: Normal range of motion. She exhibits no edema or tenderness.  Lymphadenopathy:    She has no cervical adenopathy.  Neurological: She is alert and oriented to person, place, and time. No cranial nerve deficit. She exhibits normal muscle tone.  Skin: Skin is warm and dry. No rash noted. No erythema. No pallor.  Nursing note and vitals reviewed.   ED Course  Procedures   DIAGNOSTIC STUDIES:  Oxygen Saturation is 97% on RA, normal by my interpretation.    COORDINATION OF CARE:  3:55 AM Discussed treatment plan with pt at bedside and pt agreed to plan.  Labs Review Labs Reviewed  CBC WITH DIFFERENTIAL/PLATELET - Abnormal; Notable for the following:    Hemoglobin 11.6 (*)    MCV 73.4 (*)    MCH 23.4 (*)    Lymphs Abs 4.3 (*)    All other  components within normal limits  COMPREHENSIVE METABOLIC PANEL - Abnormal; Notable for the following:    Potassium 3.2 (*)    BUN 5 (*)    All other components within normal limits  URINALYSIS, ROUTINE W REFLEX MICROSCOPIC (NOT AT [facility]) - Abnormal; Notable for the following:    Hgb urine dipstick LARGE (*)    Leukocytes, UA SMALL (*)    All other components within normal limits  URINE MICROSCOPIC-ADD ON - Abnormal; Notable for the following:    Squamous Epithelial / LPF FEW (*)    Bacteria, UA FEW (*)    All other components within normal limits  LIPASE, BLOOD  POC URINE PREG, ED    Imaging Review Dg  Chest 2 View  05/31/2015   CLINICAL DATA:  RIGHT upper quadrant abdominal pain tonight. History of asthma.  EXAM: CHEST  2 VIEW  COMPARISON:  None.  FINDINGS: Cardiomediastinal silhouette is normal. The lungs are clear without pleural effusions or focal consolidations. Trachea projects midline and there is no pneumothorax. Soft tissue planes and included osseous structures are non-suspicious. Large body habitus.  IMPRESSION: Normal chest.   Electronically Signed   By: Elon Alas M.D.   On: 05/31/2015 04:47   US Abdomen Limited Ruq  05/31/2015   CLINICAL DATA:  Acute onset of right upper quadrant abdominal pain. Initial encounter.  EXAM: US ABDOMEN LIMITED - RIGHT UPPER QUADRANT  COMPARISON:  Right upper quadrant ultrasound performed 05/20/2015  FINDINGS: Gallbladder:  No gallstones or wall thickening visualized. No sonographic Murphy sign noted.  Common bile duct:  Diameter: 0.3 cm, within normal limits in caliber.  Liver:  No focal lesion identified. Within normal limits in parenchymal echogenicity.  IMPRESSION: Unremarkable ultrasound of the right upper quadrant.   Electronically Signed   By: Garald Balding M.D.   On: 05/31/2015 05:21     EKG Interpretation   Date/Time:  Thursday May 31 2015 05:19:47 EDT Ventricular Rate:  70 PR Interval:  126 QRS Duration: 90 QT Interval:   409 QTC Calculation: 441 R Axis:   65 Text Interpretation:  Sinus rhythm Borderline T abnormalities, diffuse  leads No significant change since last tracing Confirmed by Glynn Octave [phone]) on 05/31/2015 5:35:27 AM      MDM   Final diagnoses:  None   Patient presents to the ED for abdominal pain and shortness of breath. Exam does not reveal any wheezing. She may have treated herself at home. Physical exam was concerning for gallbladder pathology. We'll obtain laboratory studies and right upper quadrant ultrasound for evaluation. Chest x-ray is unremarkable. Patient was given morphine and Toradol for pain control.  Chest x-ray and ultrasound are unremarkable. Laboratory studies are normal as well. Mother is now present states she has had this right-sided pain with right shoulder pain for years now. She is followed at [facility]. I do not believe this an acute medical emergent condition that currently exists. They're advised to follow-up with a primary care physician for further management.  She appears well in no acute distress. Vital signs remain within her normal limits and she is safe for discharge.   I personally performed the services described in this documentation, which was scribed in my presence. The recorded information has been reviewed and is accurate.   Glenda Balls, MD 05/31/15 [phone]

## 2015-06-05 ENCOUNTER — Ambulatory Visit (INDEPENDENT_AMBULATORY_CARE_PROVIDER_SITE_OTHER): Payer: Federal, State, Local not specified - PPO | Admitting: Internal Medicine

## 2015-06-05 ENCOUNTER — Encounter: Payer: Self-pay | Admitting: Internal Medicine

## 2015-06-05 VITALS — BP 130/70 | Temp 98.1°F | Wt 280.3 lb

## 2015-06-05 DIAGNOSIS — Z79899 Other long term (current) drug therapy: Secondary | ICD-10-CM

## 2015-06-05 DIAGNOSIS — R8290 Unspecified abnormal findings in urine: Secondary | ICD-10-CM | POA: Insufficient documentation

## 2015-06-05 DIAGNOSIS — F909 Attention-deficit hyperactivity disorder, unspecified type: Secondary | ICD-10-CM

## 2015-06-05 DIAGNOSIS — R109 Unspecified abdominal pain: Secondary | ICD-10-CM

## 2015-06-05 DIAGNOSIS — R1011 Right upper quadrant pain: Secondary | ICD-10-CM

## 2015-06-05 LAB — POCT URINALYSIS DIP (MANUAL ENTRY)
Bilirubin, UA: NEGATIVE
Blood, UA: NEGATIVE
Glucose, UA: NEGATIVE
Ketones, POC UA: NEGATIVE
Nitrite, UA: NEGATIVE
Protein Ur, POC: NEGATIVE
Spec Grav, UA: 1.02
Urobilinogen, UA: NEGATIVE
pH, UA: 5.5

## 2015-06-05 MED ORDER — CIPROFLOXACIN HCL 500 MG PO TABS: 500.0000 mg | ORAL_TABLET | Freq: Two times a day (BID) | ORAL | Status: DC

## 2015-06-05 MED ORDER — METHYLPHENIDATE HCL ER (OSM) 36 MG PO TBCR: 36.0000 mg | EXTENDED_RELEASE_TABLET | Freq: Every day | ORAL | Status: DC

## 2015-06-05 MED ORDER — METHYLPHENIDATE HCL ER (OSM) 36 MG PO TBCR: 72.0000 mg | EXTENDED_RELEASE_TABLET | Freq: Every day | ORAL | Status: DC

## 2015-06-05 MED ORDER — METHYLPHENIDATE HCL ER 36 MG PO TB24: 36.0000 mg | ORAL_TABLET | Freq: Every day | ORAL | Status: DC

## 2015-06-05 NOTE — Progress Notes (Signed)
Pre visit review using our clinic review tool, if applicable. No additional management support is needed unless otherwise documented below in the visit note.  Chief Complaint  Patient presents with  . Post ED Follow Up    right side upper pain     HPI: Patient come in for follow up from ED visit x 2  July 31 and August 11 for acute onset  Right upper q and side pain  Some sob no cough  To ed by ems  Very severe deep pain and sharp not related to eating  . For abd pain    right side pain   Acute onset   And  Then ? breathing  A problem.  And second time  Wheezing.? No cough  Both times limited UQ [place] neg  Blood tests ok  ua first time neg second  Pos blood and wbc  No comment in record. No uti sx   Pain is sore now  Radiates some to upper back .   Had baseline shoulder pain  In past  But had some ruq pain that radiated to right shoulder .  No leg swelling   But had cramp on righ t side  With fatigue hemiparesis feels  Weaker no falling  ROS: See pertinent positives and negatives per HPI. Needs refill of concertal  Thinks weight is adding to her problems and to see nutritionist.   Past Medical History  Diagnosis Date  . Asthma   . Pulmonary hemorrhage of fetus or newborn     48 week C-section [facility] of with birth hypoxia  . Hypoxia of newborn   . Hearing loss in right ear     with hearing aid bilateral   . Hemiparesis     rt from neonatal period  . Allergy   . ADHD (attention deficit hyperactivity disorder)   . Irregular periods 06/04/2011  . BMI (body mass index), pediatric, 95-99% for age 62/10/2010    Is starting to gain weight again and advised and counseled today about reduction for health risk reasons. Patient is very well aware of how to do lifestyle intervention she has done this before.     Family History  Problem Relation Age of Onset  . Adopted: Yes  . Hypertension    . Schizophrenia Mother     with alcohol drug abuse    Social History   Social History  . Marital  Status: Single    Spouse Name: N/A  . Number of Children: N/A  . Years of Education: N/A   Social History Main Topics  . Smoking status: Never Smoker   . Smokeless tobacco: None  . Alcohol Use: No  . Drug Use: No  . Sexual Activity: Yes    Birth Control/ Protection: Pill   Other Topics Concern  . 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 [name]- pulm at [facility] now has a new pulmonary Dr. Now stable and on prn       Thyroid evaluation in the past neck evaluation within normal limits and no thyroid disease.    Outpatient Prescriptions Prior to Visit  Medication Sig Dispense Refill  . albuterol (PROVENTIL HFA;VENTOLIN HFA) 108 (90 BASE) MCG/ACT inhaler Inhale 2 puffs into the lungs every 6 (six) hours as  needed for wheezing or shortness of breath. 1 Inhaler 4  . albuterol (PROVENTIL) (2.5 MG/3ML) 0.083% nebulizer solution Take 3 mLs (2.5 mg total) by nebulization every 4 (four) hours as needed for wheezing or shortness of breath. 75 mL 2  . fluticasone-salmeterol (ADVAIR HFA) 230-21 MCG/ACT inhaler Inhale 2 puffs into the lungs 2 (two) times daily.    . hydrOXYzine (ATARAX) 10 MG tablet Take 10 mg by mouth 3 (three) times daily as needed for itching.     Marland Kitchen ibuprofen (ADVIL,MOTRIN) 200 MG tablet Take 200 mg by mouth every 6 (six) hours as needed for headache.    . ipratropium (ATROVENT) 0.02 % nebulizer solution Take 0.5 mg by nebulization every 4 (four) hours as needed for wheezing or shortness of breath.    . ketoconazole (NIZORAL) 2 % cream Apply 1 application topically 2 (two) times daily. For axillary rash 30 g 1  . montelukast (SINGULAIR) 10 MG tablet Take 1 tablet (10 mg total) by mouth at bedtime. 90 tablet 3  . norethindrone-ethinyl estradiol (JUNEL FE,GILDESS FE,LOESTRIN FE) 1-20 MG-MCG tablet Take 1  tablet by mouth daily. 1 Package 11  . omeprazole (PRILOSEC OTC) 20 MG tablet Take 20 mg by mouth 2 (two) times daily as needed (for heartburn/acid reflux).    Marland Kitchen tiotropium (SPIRIVA) 18 MCG inhalation capsule Place 18 mcg into inhaler and inhale daily.    . methylphenidate 36 MG PO CR tablet Take 2 tablets (72 mg total) by mouth daily. 60 tablet 0  . fluticasone-salmeterol (ADVAIR HFA) 230-21 MCG/ACT inhaler Inhale 2 puffs into the lungs 2 (two) times daily.     Marland Kitchen omeprazole (PRILOSEC) 20 MG capsule Take 1 capsule (20 mg total) by mouth 2 (two) times daily before a meal. (Patient not taking: Reported on 05/31/2015) 60 capsule 3  . tiotropium (SPIRIVA) 18 MCG inhalation capsule Place 18 mcg into inhaler and inhale daily.     . traMADol (ULTRAM) 50 MG tablet Take 1 tablet (50 mg total) by mouth every 6 (six) hours as needed. (Patient not taking: Reported on 06/05/2015) 15 tablet 0   No facility-administered medications prior to visit.     EXAM:  BP 130/70 mmHg  Temp(Src) 98.1 F (36.7 C) (Oral)  Wt 280 lb 4.8 oz (127.143 kg)  LMP 05/17/2015  Body mass index is 43.23 kg/(m^2).  GENERAL: vitals reviewed and listed above, alert, oriented, appears well hydrated and in no acute distress HEENT: atraumatic, conjunctiva  clear, no obvious abnormalities on inspection of external nose and ears  NECK: no obvious masses on inspection palpation  LUNGS: clear to auscultation bilaterally, no wheezes, rales or rhonchi, good air movement CV: HRRR, no clubbing cyanosis or  peripheral edema nl cap refill  abd soft but tender at right lower rib cage area and right flank area    No rebound guarding    MS: moves all extremities without noticeable focal  abnormality PSYCH: pleasant and cooperative, no obvious depression or anxiety Wt Readings from Last 3 Encounters:  06/05/15 280 lb 4.8 oz (127.143 kg)  05/31/15 270 lb (122.471 kg)  05/19/15 270 lb (122.471 kg)    ASSESSMENT AND PLAN:  Discussed the  following assessment and plan:  Abdominal pain, right upper quadrant - Plan: POCT urinalysis dipstick, Culture, Urine, GC/chlamydia probe amp, urine  Abnormal urine - Plan: POCT urinalysis dipstick, Culture, Urine, GC/chlamydia probe amp, urine  Medication management  Attention deficit hyperactivity disorder (ADHD), unspecified ADHD type - ok to refill medication  Right flank  pain  Severe obesity (BMI >= 40) - certainly can add to sx   Do repeat ua and culture gc chlamydia    Had normal pap and neg screen in march Has had to ed visits with no dx  And limited  US  Imaging  Still could be  gb dysfunction but  Acts more colicky  Concern about  Radiation to shoulder  But has underlying shoulder issues  No  Acute abd today.  consideration  Of still gb  Renal stone uti atypical and even PE . She  States that this abd side pain is new  But shoulder may be old.  empric rx for uti  With risk antibiotic  If  persistent or progressive we may need to get renal abd chest imaging .  -Patient advised to return or notify health care team  if symptoms worsen ,persist or new concerns arise. Total visit 40 mins > 50% spent counseling and coordinating care as indicated in above note and in instructions to patient .   Patient Instructions  Uncertain cause of  Pain but  Consideration of atypical uti.Not beall bladder  Kidney stone or even chest wall tenderness. Consideration of  Imaging of  Kidneys  Ultrasound  Or ct scan   because of severity of pain .if ongoing .  I agree that increase weight make it harder to  Breath sometimes . Begin antibiotic  Pending the culture .      Standley Brooking. Manuelita Moxon M.D.  See ed assessment  Patient presents to the ED for abdominal pain and shortness of breath. Exam does not reveal any wheezing. She may have treated herself at home. Physical exam was concerning for gallbladder pathology. We'll obtain laboratory studies and right upper quadrant ultrasound for evaluation. Chest  x-ray is unremarkable. Patient was given morphine and Toradol for pain control.  Chest x-ray and ultrasound are unremarkable. Laboratory studies are normal as well. Mother is now present states she has had this right-sided pain with right shoulder pain for years now. She is [facility] at [facility]. I do not believe this an acute medical emergent condition that currently exists. They're advised to follow-up with a primary care physician for further management. She appears well in no acute distress. Vital signs remain within her normal limits and she is safe for discharge.   I personally performed the services described in this documentation, which was scribed in my presence. The recorded information has been reviewed and is accurate.   [name], MD 05/31/15 [phone]

## 2015-06-05 NOTE — Patient Instructions (Signed)
Uncertain cause of  Pain but  Consideration of atypical uti.Not beall bladder  Kidney stone or even chest wall tenderness. Consideration of  Imaging of  Kidneys  Ultrasound  Or ct scan   because of severity of pain .if ongoing .  I agree that increase weight make it harder to  Breath sometimes . Begin antibiotic  Pending the culture .

## 2015-06-11 ENCOUNTER — Other Ambulatory Visit: Payer: Self-pay | Admitting: Family Medicine

## 2015-06-11 ENCOUNTER — Encounter: Payer: Self-pay | Admitting: *Deleted

## 2015-06-11 ENCOUNTER — Encounter: Payer: Federal, State, Local not specified - PPO | Attending: Internal Medicine | Admitting: *Deleted

## 2015-06-11 DIAGNOSIS — E669 Obesity, unspecified: Secondary | ICD-10-CM | POA: Diagnosis present

## 2015-06-11 DIAGNOSIS — Z6841 Body Mass Index (BMI) 40.0 and over, adult: Secondary | ICD-10-CM | POA: Insufficient documentation

## 2015-06-11 DIAGNOSIS — Z713 Dietary counseling and surveillance: Secondary | ICD-10-CM | POA: Insufficient documentation

## 2015-06-11 NOTE — Patient Instructions (Signed)
Aim to eat 3 meals/day and avoid meal skipping Aim to eat all food at the table without distractions Aim to eat more slowly and stop eating when comfortable, not stuffed Follow MyPlate recommendations for meal planning Limits sugary beverages

## 2015-06-11 NOTE — Progress Notes (Signed)
  Medical Nutrition Therapy:  Appt start time: 6578 end time:  1600.   Assessment:  Primary concerns today: Rosebud states she would like to lose weight.  She states she has a lot of health problems that are exacerbated by her weight.  She feels that she eats for reasons other than biological hunger.  She thinks she drinks a lot of sodas and eats a lot of sweet stuff.  She says it's hard to stop.  She has been trying to work out and exercise, but "it doesn't do any good."  She states she has been in and out of the hospital through asthma attacks and the steroids increased her weight. She finds it's hard to get out of bed and has difficulty breathing when laying down.  Feels like her chest is "heavy".  She can't fit in clothes and it's hard to breath and catch her breath. She lives with her mom and sister and her sister uses her food stamps to do the grocery shopping.  Sister also does the cooking.  Kemoni feels that sister cooks less healthy.  She eats out maybe once a week.  When at home she eats dinner in the kitchen or in her bedroom.  She often eats while distracted.  She is a fast eater.    Menstrual cycles are irregular and positive for hirsutism.  Has never been evaluated for PCOS that she knows of.      Preferred Learning Style:   No preference indicated   Learning Readiness:   Ready   MEDICATIONS: see list   DIETARY INTAKE:  Usual eating pattern includes 2 meals and 4-5 snacks per day.( per patient)  24-hr recall:  B ( AM): sausage, eggs, pancakes, toast  Snk ( AM):  cookies, chips, fruit sometimes, smoothies  L ( PM): leftovers Snk ( PM): cookies, chips, fruit sometimes, smoothies D ( PM): whatever sister makes Snk ( PM):  cookies, chips, fruit sometimes, smoothies Beverages: sodas, fruit punch, flavored water  Usual physical activity: walks sometimes.  Goes bowling sometimes    Estimated energy needs: 2000-2200 calories    Nutritional Diagnosis:  NI-1.5 Excessive  energy intake As related to mindless eating, frequent snacking, and excessive consumption sugary beverages.  As evidenced by dietary recall.    Intervention:  Nutrition counseling provided.  Suggested talking with PCP about screening for PCOS. Discussed metabolic effects of meal skipping and discouraged this practice.  Discussed MyPlate recommendations for meal planning and mindful eating  Goals: Aim to eat 3 meals/day and avoid meal skipping Aim to eat all food at the table without distractions Aim to eat more slowly and stop eating when comfortable, not stuffed Follow MyPlate recommendations for meal planning Limits sugary beverages  Teaching Method Utilized:  Visual Auditory   Handouts given during visit include:  MyPlate  Barriers to learning/adherence to lifestyle change: doesn't prepare own food  Demonstrated degree of understanding via:  Teach Back   Monitoring/Evaluation:  Dietary intake, exercise,  and body weight in 4 week(s).

## 2015-06-12 ENCOUNTER — Encounter: Payer: Self-pay | Admitting: Internal Medicine

## 2015-06-12 ENCOUNTER — Ambulatory Visit (INDEPENDENT_AMBULATORY_CARE_PROVIDER_SITE_OTHER): Payer: Federal, State, Local not specified - PPO | Admitting: Internal Medicine

## 2015-06-12 VITALS — BP 126/70 | Temp 97.6°F | Wt 274.6 lb

## 2015-06-12 DIAGNOSIS — R1011 Right upper quadrant pain: Secondary | ICD-10-CM | POA: Diagnosis not present

## 2015-06-12 DIAGNOSIS — Z23 Encounter for immunization: Secondary | ICD-10-CM | POA: Diagnosis not present

## 2015-06-12 DIAGNOSIS — R635 Abnormal weight gain: Secondary | ICD-10-CM | POA: Diagnosis not present

## 2015-06-12 MED ORDER — KETOCONAZOLE 2 % EX CREA: 1.0000 "application " | TOPICAL_CREAM | Freq: Two times a day (BID) | CUTANEOUS | Status: DC

## 2015-06-12 NOTE — Patient Instructions (Signed)
Continue    Dietary changes  Getting rid of the sugar drinks .    Plan fasting labs  With water ok   Make appt and  we will put in orders  To check for some fones that can contribute to weight gain .  Urine test when you get the lab ( off of   Periods )  We can consider getting  a full abdominal ultrasound also .  Maybe the pelvic ultrasounds .   Then ROV   In about   Month  Or so

## 2015-06-12 NOTE — Telephone Encounter (Signed)
Sent to the pharmacy by e-scribe. 

## 2015-06-12 NOTE — Progress Notes (Signed)
Chief Complaint  Patient presents with  . Follow-up    HPI: Comes in  for fu form last week abd chest pain    No recurrence and no cough sob now . Has been to nutritionist once and is working on cutting out sugar beverages  Mom thinks and she thinks the weight gain causing sx .     Some concern about metabolic disorder causing this .   No osa .   Took antibiotic   No new sx  Apparently fam hx of dm  Pt reports inc body hair chin but mom says doesn't see difference   ROS: See pertinent positives and negatives per HPI.  Past Medical History  Diagnosis Date  . Asthma   . Pulmonary hemorrhage of fetus or newborn     28 week C-section [facility] of with birth hypoxia  . Hypoxia of newborn   . Hearing loss in right ear     with hearing aid bilateral   . Hemiparesis     rt from neonatal period  . Allergy   . ADHD (attention deficit hyperactivity disorder)   . Irregular periods 06/04/2011  . BMI (body mass index), pediatric, 95-99% for age 37/10/2010    Is starting to gain weight again and advised and counseled today about reduction for health risk reasons. Patient is very well aware of how to do lifestyle intervention she has done this before.     Family History  Problem Relation Age of Onset  . Adopted: Yes  . Hypertension    . Schizophrenia Mother     with alcohol drug abuse  . Diabetes Other     Social History   Social History  . Marital Status: Single    Spouse Name: N/A  . Number of Children: N/A  . Years of Education: N/A   Social History Main Topics  . Smoking status: Never Smoker   . Smokeless tobacco: None  . Alcohol Use: No  . Drug Use: No  . Sexual Activity: Yes    Birth Control/ Protection: Pill   Other Topics Concern  . 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 [name]- pulm at  [facility] now has a new pulmonary Dr. Now stable and on prn       Thyroid evaluation in the past neck evaluation within normal limits and no thyroid disease.    Outpatient Prescriptions Prior to Visit  Medication Sig Dispense Refill  . albuterol (PROVENTIL HFA;VENTOLIN HFA) 108 (90 BASE) MCG/ACT inhaler Inhale 2 puffs into the lungs every 6 (six) hours as needed for wheezing or shortness of breath. 1 Inhaler 4  . albuterol (PROVENTIL) (2.5 MG/3ML) 0.083% nebulizer solution Take 3 mLs (2.5 mg total) by nebulization every 4 (four) hours as needed for wheezing or shortness of breath. 75 mL 2  . ciprofloxacin (CIPRO) 500 MG tablet Take 1 tablet (500 mg total) by mouth 2 (two) times daily. 14 tablet 0  . fluticasone-salmeterol (ADVAIR HFA) 230-21 MCG/ACT inhaler Inhale 2 puffs into the lungs 2 (two) times daily.    . hydrOXYzine (ATARAX) 10 MG tablet Take 10 mg by mouth 3 (three) times daily as needed for itching.     Marland Kitchen ibuprofen (ADVIL,MOTRIN) 200 MG tablet Take 200 mg by mouth every 6 (  six) hours as needed for headache.    . ipratropium (ATROVENT) 0.02 % nebulizer solution Take 0.5 mg by nebulization every 4 (four) hours as needed for wheezing or shortness of breath.    . methylphenidate (CONCERTA) 36 MG PO CR tablet Take 1 tablet (36 mg total) by mouth daily. 60 tablet 0  . methylphenidate 36 MG PO CR tablet Take 1 tablet (36 mg total) by mouth daily. 60 tablet 0  . methylphenidate 36 MG PO CR tablet Take 2 tablets (72 mg total) by mouth daily. 60 tablet 0  . montelukast (SINGULAIR) 10 MG tablet Take 1 tablet (10 mg total) by mouth at bedtime. 90 tablet 3  . Multiple Vitamin (MULTIVITAMIN) tablet Take 1 tablet by mouth daily.    . norethindrone-ethinyl estradiol (JUNEL FE,GILDESS FE,LOESTRIN FE) 1-20 MG-MCG tablet Take 1 tablet by mouth daily. 1 Package 11  . omeprazole (PRILOSEC OTC) 20 MG tablet Take 20 mg by mouth 2 (two) times daily as needed (for heartburn/acid reflux).    Marland Kitchen omeprazole (PRILOSEC)  20 MG capsule Take 1 capsule (20 mg total) by mouth 2 (two) times daily before a meal. 60 capsule 3  . tiotropium (SPIRIVA) 18 MCG inhalation capsule Place 18 mcg into inhaler and inhale daily.    . traMADol (ULTRAM) 50 MG tablet Take 1 tablet (50 mg total) by mouth every 6 (six) hours as needed. 15 tablet 0  . ketoconazole (NIZORAL) 2 % cream Apply 1 application topically 2 (two) times daily. For axillary rash 30 g 1  . fluticasone-salmeterol (ADVAIR HFA) 230-21 MCG/ACT inhaler Inhale 2 puffs into the lungs 2 (two) times daily.     Marland Kitchen tiotropium (SPIRIVA) 18 MCG inhalation capsule Place 18 mcg into inhaler and inhale daily.      No facility-administered medications prior to visit.     EXAM:  BP 126/70 mmHg  Temp(Src) 97.6 F (36.4 C) (Oral)  Wt 274 lb 9.6 oz (124.558 kg)  LMP 06/11/2015  Body mass index is 42.35 kg/(m^2).  GENERAL: vitals reviewed and listed above, alert, oriented, appears well hydrated and in no acute distress HEENT: atraumatic, conjunctiva  clear, no obvious abnormalities on inspection of external nose and ears LUNGS: clear to auscultation bilaterally, no wheezes, rales or rhonchi, good air movement CV: HRRR, no clubbing cyanosis or  peripheral edema nl cap refill  nostria   Some chio hair  No excess otherwise  Red rash upper body near arms  abd soft no point tendernse  MS: moves all extremities without noticeable focal  abnormality PSYCH: pleasant and cooperative, no obvious depression or anxiety Lab Results  Component Value Date   WBC 9.8 05/31/2015   HGB 11.6* 05/31/2015   HCT 36.4 05/31/2015   PLT 399 05/31/2015   GLUCOSE 99 05/31/2015   CHOL 171 04/20/2015   TRIG 56.0 04/20/2015   HDL 59.40 04/20/2015   LDLCALC 100* 04/20/2015   ALT 43 05/31/2015   AST 29 05/31/2015   NA 140 05/31/2015   K 3.2* 05/31/2015   CL 106 05/31/2015   CREATININE 0.91 05/31/2015   BUN 5* 05/31/2015   CO2 22 05/31/2015   TSH 3.18 04/20/2015   Wt Readings from Last 3  Encounters:  06/12/15 274 lb 9.6 oz (124.558 kg)  06/05/15 280 lb 4.8 oz (127.143 kg)  05/31/15 270 lb (122.471 kg)    ASSESSMENT AND PLAN:  Discussed the following assessment and plan:  Weight gain - check metabolic cortisol fastin ginsulin but cont dietary counsel ocps etc  consdier [place] abd pelvis cause of sx  - Plan: CBC with Differential/Platelet, Sedimentation rate, Insulin, Fasting, Hemoglobin A1c, DHEA-sulfate, Testosterone, 17-Hydroxyprogesterone, Cortisol-am, blood, Ferritin, POCT urinalysis dipstick  Abdominal pain, right upper quadrant - resolved  close observation  - Plan: CBC with Differential/Platelet, Sedimentation rate, Insulin, Fasting, Hemoglobin A1c, DHEA-sulfate, Testosterone, 17-Hydroxyprogesterone, Cortisol-am, blood, Ferritin, POCT urinalysis dipstick  Severe obesity (BMI >= 40) - hx of obesity but worse in past year  on ocps reg menses metabolic dysfunction certain lu poss "PCOS"  - Plan: CBC with Differential/Platelet, Sedimentation rate, Insulin, Fasting, Hemoglobin A1c, DHEA-sulfate, Testosterone, 17-Hydroxyprogesterone, Cortisol-am, blood, Ferritin, POCT urinalysis dipstick  Encounter for immunization Test were not done as ordered  gc chl and cx test  As planned  But  No sx at this time  And not high risk   Cbc esr , fasting insulin? Vs gtt a1c  Bmp test  Esr  D  dimer? Test dhea am cortisol  17 serum hydroxyprogesteron fam hx of   Dm   . For Keisy.  Some inc body hair  suagr beverages are the weakness.   And caffine  Disc   Hair  Face more rin past year  . Mom worried about larger abd this year,  ? abd [place] plus minus  Pelvic  -Patient advised to return or notify health care team  if symptoms worsen ,persist or new concerns arise.  Patient Instructions  Continue    Dietary changes  Getting rid of the sugar drinks .    Plan fasting labs  With water ok   Make appt and  we will put in orders  To check for some fones that can contribute to weight gain .  Urine test  when you get the lab ( off of   Periods )  We can consider getting  a full abdominal ultrasound also .  Maybe the pelvic ultrasounds .   Then ROV   In about   Month  Or so     Standley Brooking. Lorena Benham M.D.

## 2015-06-19 ENCOUNTER — Encounter: Payer: Self-pay | Admitting: Internal Medicine

## 2015-07-03 ENCOUNTER — Other Ambulatory Visit (INDEPENDENT_AMBULATORY_CARE_PROVIDER_SITE_OTHER): Payer: Federal, State, Local not specified - PPO

## 2015-07-03 DIAGNOSIS — R635 Abnormal weight gain: Secondary | ICD-10-CM

## 2015-07-03 DIAGNOSIS — R1011 Right upper quadrant pain: Secondary | ICD-10-CM | POA: Diagnosis not present

## 2015-07-03 LAB — POCT URINALYSIS DIP (MANUAL ENTRY)
Bilirubin, UA: NEGATIVE
Blood, UA: NEGATIVE
Glucose, UA: NEGATIVE
Ketones, POC UA: NEGATIVE
Leukocytes, UA: NEGATIVE
Nitrite, UA: NEGATIVE
Protein Ur, POC: NEGATIVE
Spec Grav, UA: >=1.03
Urobilinogen, UA: 0.2
pH, UA: 5.5

## 2015-07-03 LAB — CBC WITH DIFFERENTIAL/PLATELET
Basophils Absolute: 0 10*3/uL (ref 0.0–0.1)
Basophils Relative: 0.3 % (ref 0.0–3.0)
Eosinophils Absolute: 0.2 10*3/uL (ref 0.0–0.7)
Eosinophils Relative: 2.8 % (ref 0.0–5.0)
HCT: 40.1 % (ref 36.0–46.0)
Hemoglobin: 12.8 g/dL (ref 12.0–15.0)
Lymphocytes Relative: 38.8 % (ref 12.0–46.0)
Lymphs Abs: 2.8 10*3/uL (ref 0.7–4.0)
MCHC: 32 g/dL (ref 30.0–36.0)
MCV: 71.8 fl — ABNORMAL LOW (ref 78.0–100.0)
Monocytes Absolute: 0.4 10*3/uL (ref 0.1–1.0)
Monocytes Relative: 5.2 % (ref 3.0–12.0)
Neutro Abs: 3.8 10*3/uL (ref 1.4–7.7)
Neutrophils Relative %: 52.9 % (ref 43.0–77.0)
Platelets: 366 10*3/uL (ref 150.0–400.0)
RBC: 5.58 Mil/uL — ABNORMAL HIGH (ref 3.87–5.11)
RDW: 15.8 % — ABNORMAL HIGH (ref 11.5–15.5)
WBC: 7.2 10*3/uL (ref 4.0–10.5)

## 2015-07-03 LAB — TESTOSTERONE: Testosterone: 65.26 ng/dL — ABNORMAL HIGH (ref 15.00–40.00)

## 2015-07-03 LAB — FERRITIN: Ferritin: 21.3 ng/mL (ref 10.0–291.0)

## 2015-07-03 LAB — SEDIMENTATION RATE: Sed Rate: 17 mm/hr (ref 0–22)

## 2015-07-03 LAB — HEMOGLOBIN A1C: Hgb A1c MFr Bld: 6.1 % (ref 4.6–6.5)

## 2015-07-04 LAB — INSULIN, FASTING: Insulin fasting, serum: 28.3 u[IU]/mL — ABNORMAL HIGH (ref 2.0–19.6)

## 2015-07-04 LAB — CORTISOL-AM, BLOOD: Cortisol - AM: 13 ug/dL (ref 4.3–22.4)

## 2015-07-04 LAB — DHEA-SULFATE: DHEA-SO4: 247 ug/dL (ref 18–391)

## 2015-07-08 LAB — 17-HYDROXYPROGESTERONE: 17-OH-Progesterone, LC/MS/MS: 47 ng/dL

## 2015-07-11 ENCOUNTER — Emergency Department (HOSPITAL_COMMUNITY): Payer: Federal, State, Local not specified - PPO

## 2015-07-11 ENCOUNTER — Emergency Department (HOSPITAL_COMMUNITY)
Admission: EM | Admit: 2015-07-11 | Discharge: 2015-07-11 | Disposition: A | Discharge status: Home / Self Care | Payer: Federal, State, Local not specified - PPO | Attending: Physician Assistant | Admitting: Physician Assistant

## 2015-07-11 ENCOUNTER — Encounter (HOSPITAL_COMMUNITY): Payer: Self-pay | Admitting: *Deleted

## 2015-07-11 DIAGNOSIS — Z79818 Long term (current) use of other agents affecting estrogen receptors and estrogen levels: Secondary | ICD-10-CM | POA: Insufficient documentation

## 2015-07-11 DIAGNOSIS — H9190 Unspecified hearing loss, unspecified ear: Secondary | ICD-10-CM | POA: Insufficient documentation

## 2015-07-11 DIAGNOSIS — Z8659 Personal history of other mental and behavioral disorders: Secondary | ICD-10-CM | POA: Diagnosis not present

## 2015-07-11 DIAGNOSIS — Z7951 Long term (current) use of inhaled steroids: Secondary | ICD-10-CM | POA: Insufficient documentation

## 2015-07-11 DIAGNOSIS — Z8742 Personal history of other diseases of the female genital tract: Secondary | ICD-10-CM | POA: Diagnosis not present

## 2015-07-11 DIAGNOSIS — R109 Unspecified abdominal pain: Secondary | ICD-10-CM | POA: Diagnosis not present

## 2015-07-11 DIAGNOSIS — Z79899 Other long term (current) drug therapy: Secondary | ICD-10-CM | POA: Diagnosis not present

## 2015-07-11 DIAGNOSIS — R0602 Shortness of breath: Secondary | ICD-10-CM | POA: Diagnosis present

## 2015-07-11 DIAGNOSIS — J45901 Unspecified asthma with (acute) exacerbation: Secondary | ICD-10-CM | POA: Diagnosis not present

## 2015-07-11 LAB — D-DIMER, QUANTITATIVE: D-Dimer, Quant: 0.34 ug/mL-FEU (ref 0.00–0.48)

## 2015-07-11 MED ORDER — ALBUTEROL SULFATE (2.5 MG/3ML) 0.083% IN NEBU
5.0000 mg | INHALATION_SOLUTION | Freq: Once | RESPIRATORY_TRACT | Status: AC
  Administered 2015-07-11: 5 mg via RESPIRATORY_TRACT
  Filled 2015-07-11: qty 6

## 2015-07-11 NOTE — ED Provider Notes (Signed)
CSN: [id number]     Arrival date & time 07/11/15  1224 History   First MD Initiated Contact with Patient 07/11/15 1319     Chief Complaint  Patient presents with  . Shortness of Breath  . Abdominal Pain     (Consider location/radiation/quality/duration/timing/severity/associated sxs/prior Treatment) HPI   Patient is a 24 year old presenting with shortness breath and asthma. Patient has intermittent asthma. She takes 2 inhalers at home. Patient was at home alone with a child and woke up short of breath. She felt very anxious about it took her inhaler to get better. She was still anxious Therefore she called her mom. Her mom had her take her inhaler again. She still felt scared and so she called EMS. Patient did have right-sided chest pain.   Patient's had this right-sided chest pain for several months. Patient has not had thoughough work u[ on this, only treatment for asthma.     Past Medical History  Diagnosis Date  . Asthma   . Pulmonary hemorrhage of fetus or newborn     4 week C-section [facility] of with birth hypoxia  . Hypoxia of newborn   . Hearing loss in right ear     with hearing aid bilateral   . Hemiparesis     rt from neonatal period  . Allergy   . ADHD (attention deficit hyperactivity disorder)   . Irregular periods 06/04/2011  . BMI (body mass index), pediatric, 95-99% for age 55/10/2010    Is starting to gain weight again and advised and counseled today about reduction for health risk reasons. Patient is very well aware of how to do lifestyle intervention she has done this before.    History reviewed. No pertinent past surgical history. Family History  Problem Relation Age of Onset  . Adopted: Yes  . Hypertension    . Schizophrenia Mother     with alcohol drug abuse  . Diabetes Other    Social History  Substance Use Topics  . Smoking status: Never Smoker   . Smokeless tobacco: None  . Alcohol Use: No   OB History    No data available     Review of Systems   Constitutional: Negative for activity change and fatigue.  HENT: Negative for congestion and drooling.   Eyes: Negative for discharge.  Respiratory: Positive for shortness of breath. Negative for cough and chest tightness.   Cardiovascular: Positive for chest pain. Negative for palpitations.  Gastrointestinal: Negative for abdominal distention.  Genitourinary: Negative for dysuria and difficulty urinating.  Musculoskeletal: Negative for joint swelling.  Skin: Negative for rash.  Allergic/Immunologic: Negative for immunocompromised state.  Neurological: Negative for dizziness.  Psychiatric/Behavioral: Negative for behavioral problems and agitation.      Allergies  Review of patient's allergies indicates no known allergies.  Home Medications   Prior to Admission medications   Medication Sig Start Date End Date Taking? Authorizing Provider  albuterol (PROVENTIL HFA;VENTOLIN HFA) 108 (90 BASE) MCG/ACT inhaler Inhale 2 puffs into the lungs every 6 (six) hours as needed for wheezing or shortness of breath. 09/09/13  Yes Burnis Medin, MD  albuterol (PROVENTIL) (2.5 MG/3ML) 0.083% nebulizer solution Take 3 mLs (2.5 mg total) by nebulization every 4 (four) hours as needed for wheezing or shortness of breath. 03/12/14  Yes Tatyana Kirichenko, PA-C  fluticasone-salmeterol (ADVAIR HFA) 230-21 MCG/ACT inhaler Inhale 2 puffs into the lungs 2 (two) times daily.   Yes Historical Provider, MD  ibuprofen (ADVIL,MOTRIN) 200 MG tablet Take 200 mg by mouth  every 6 (six) hours as needed for headache.   Yes Historical Provider, MD  ipratropium (ATROVENT) 0.02 % nebulizer solution Take 0.5 mg by nebulization every 4 (four) hours as needed for wheezing or shortness of breath.   Yes Historical Provider, MD  methylphenidate 36 MG PO CR tablet Take 2 tablets (72 mg total) by mouth daily. 06/05/15  Yes Burnis Medin, MD  montelukast (SINGULAIR) 10 MG tablet Take 1 tablet (10 mg total) by mouth at bedtime. 01/02/15   Yes Burnis Medin, MD  Multiple Vitamin (MULTIVITAMIN) tablet Take 1 tablet by mouth daily.   Yes Historical Provider, MD  norethindrone-ethinyl estradiol (JUNEL FE,GILDESS FE,LOESTRIN FE) 1-20 MG-MCG tablet Take 1 tablet by mouth daily. 07/21/14  Yes Burnis Medin, MD  tiotropium (SPIRIVA) 18 MCG inhalation capsule Place 18 mcg into inhaler and inhale daily.   Yes Historical Provider, MD  traMADol (ULTRAM) 50 MG tablet Take 1 tablet (50 mg total) by mouth every 6 (six) hours as needed. Patient taking differently: Take 50 mg by mouth every 6 (six) hours as needed for moderate pain or severe pain.  05/20/15  Yes Jennifer Piepenbrink, PA-C  ciprofloxacin (CIPRO) 500 MG tablet Take 1 tablet (500 mg total) by mouth 2 (two) times daily. Patient not taking: Reported on 07/11/2015 06/05/15   Burnis Medin, MD  fluticasone-salmeterol (ADVAIR HFA) [phone] MCG/ACT inhaler Inhale 2 puffs into the lungs 2 (two) times daily.  05/24/14 05/24/15  Historical Provider, MD  ketoconazole (NIZORAL) 2 % cream Apply 1 application topically 2 (two) times daily. For axillary rash Patient not taking: Reported on 07/11/2015 06/12/15   Burnis Medin, MD  methylphenidate (CONCERTA) 36 MG PO CR tablet Take 1 tablet (36 mg total) by mouth daily. Patient not taking: Reported on 07/11/2015 06/05/15   Burnis Medin, MD  methylphenidate 36 MG PO CR tablet Take 1 tablet (36 mg total) by mouth daily. Patient not taking: Reported on 07/11/2015 06/05/15   Burnis Medin, MD  omeprazole (PRILOSEC) 20 MG capsule Take 1 capsule (20 mg total) by mouth 2 (two) times daily before a meal. Patient not taking: Reported on 07/11/2015 06/20/14   Burnis Medin, MD  tiotropium (SPIRIVA) 18 MCG inhalation capsule Place 18 mcg into inhaler and inhale daily.  05/24/14 05/24/15  Historical Provider, MD   BP 123/62 mmHg  Pulse 86  Temp(Src) 98.2 F (36.8 C) (Oral)  Resp 18  Ht 5\' 7"  (1.702 m)  Wt 275 lb (124.739 kg)  BMI 43.06 kg/m2  SpO2 100%  LMP  06/04/2015 Physical Exam  Constitutional: She is oriented to person, place, and time. She appears well-developed and well-nourished.  Morbidly obese  HENT:  Head: Normocephalic and atraumatic.  Eyes: Conjunctivae are normal. Right eye exhibits no discharge.  Neck: Neck supple.  Cardiovascular: Normal rate, regular rhythm and normal heart sounds.   No murmur heard. Pulmonary/Chest: Effort normal and breath sounds normal. She has no rales.  Wheezing to the right lower lobe.  Abdominal: Soft. She exhibits no distension. There is no tenderness.  Musculoskeletal: Normal range of motion. She exhibits no edema.  Neurological: She is oriented to person, place, and time. No cranial nerve deficit.  Skin: Skin is warm and dry. No rash noted. She is not diaphoretic.  Psychiatric: She has a normal mood and affect. Her behavior is normal.  Nursing note and vitals reviewed.   ED Course  Procedures (including critical care time) Labs Review Labs Reviewed  D-DIMER, QUANTITATIVE (NOT  AT [facility])    Imaging Review Dg Chest 2 View  07/11/2015   CLINICAL DATA:  Shortness of breath and dizziness  EXAM: CHEST  2 VIEW  COMPARISON:  05/31/2015  FINDINGS: The heart size and mediastinal contours are within normal limits. Both lungs are clear. The visualized skeletal structures are unremarkable.  IMPRESSION: No active cardiopulmonary disease.   Electronically Signed   By: Conchita Paris M.D.   On: 07/11/2015 13:29   I have personally reviewed and evaluated these images and lab results as part of my medical decision-making.   EKG Interpretation None      MDM   Final diagnoses:  None    Patient is a 24 year old female with history of asthma. Patient has been escalating her asthma attacks and chest pain over the last 2 months. Mom states she's been having increasing attacks and chest pain over her right breast.  She has follow-up on Friday with her primary care and on Monday with her pulmonologist at  [facility].    Courteney Julio Alm, MD 07/11/15 1408

## 2015-07-11 NOTE — ED Notes (Signed)
Per EMS, pt from home.  Shortness of breath with RUQ pain.  Woke up at 11am with the symptoms.  RUQ pain increases with movement. Pt states she has asthma and used her inhalers with no relief.  Also given treatment by fire dept. Pt continued to c/o RUQ pain and shortness of breath. Similar occurrence per pt a couple of months ago with no DX then.  No n/v.    EKG unremarkable in route.  Vitals: 118 palp, 92 hr 98% ra, resp 22

## 2015-07-11 NOTE — ED Notes (Addendum)
Upon rn assessment, pt reports hx of asthma, no relief with inhaler. Reports she feels SOB, able to speak in full sentences. 98% on room air. Lung sound diminished. R 28/min. Also reports right sided abd pain that has been going on for months, sees GI.   Since 11 pt did inhaler and albuterol/ atrovent nebulizer. In may pt was almost intubated by ems for asthma flare up.

## 2015-07-11 NOTE — Discharge Instructions (Signed)
Go to your appointment as planned this Friday.  Asthma Asthma is a condition of the lungs in which the airways tighten and narrow. Asthma can make it hard to breathe. Asthma cannot be cured, but medicine and lifestyle changes can help control it. Asthma may be started (triggered) by:  Animal skin flakes (dander).  Dust.  Cockroaches.  Pollen.  Mold.  Smoke.  Cleaning products.  Hair sprays or aerosol sprays.  Paint fumes or strong smells.  Cold air, weather changes, and winds.  Crying or laughing hard.  Stress.  Certain medicines or drugs.  Foods, such as dried fruit, potato chips, and sparkling grape juice.  Infections or conditions (colds, flu).  Exercise.  Certain medical conditions or diseases.  Exercise or tiring activities. HOME CARE   Take medicine as told by your doctor.  Use a peak flow meter as told by your doctor. A peak flow meter is a tool that measures how well the lungs are working.  Record and keep track of the peak flow meter's readings.  Understand and use the asthma action plan. An asthma action plan is a written plan for taking care of your asthma and treating your attacks.  To help prevent asthma attacks:  Do not smoke. Stay away from secondhand smoke.  Change your heating and air conditioning filter often.  Limit your use of fireplaces and wood stoves.  Get rid of pests (such as roaches and mice) and their droppings.  Throw away plants if you see mold on them.  Clean your floors. Dust regularly. Use cleaning products that do not smell.  Have someone vacuum when you are not home. Use a vacuum cleaner with a HEPA filter if possible.  Replace carpet with wood, tile, or vinyl flooring. Carpet can trap animal skin flakes and dust.  Use allergy-proof pillows, mattress covers, and box spring covers.  Wash bed sheets and blankets every week in hot water and dry them in a dryer.  Use blankets that are made of polyester or  cotton.  Clean bathrooms and kitchens with bleach. If possible, have someone repaint the walls in these rooms with mold-resistant paint. Keep out of the rooms that are being cleaned and painted.  Wash hands often. GET HELP IF:  You have make a whistling sound when breaking (wheeze), have shortness of breath, or have a cough even if taking medicine to prevent attacks.  The colored mucus you cough up (sputum) is thicker than usual.  The colored mucus you cough up changes from clear or white to yellow, green, gray, or bloody.  You have problems from the medicine you are taking such as:  A rash.  Itching.  Swelling.  Trouble breathing.  You need reliever medicines more than 2-3 times a week.  Your peak flow measurement is still at 50-79% of your personal best after following the action plan for 1 hour.  You have a fever. GET HELP RIGHT AWAY IF:   You seem to be worse and are not responding to medicine during an asthma attack.  You are short of breath even at rest.  You get short of breath when doing very little activity.  You have trouble eating, drinking, or talking.  You have chest pain.  You have a fast heartbeat.  Your lips or fingernails start to turn blue.  You are light-headed, dizzy, or faint.  Your peak flow is less than 50% of your personal best. MAKE SURE YOU:   Understand these instructions.  Will watch your  condition.  Will get help right away if you are not doing well or get worse. Document Released: 03/24/2008 Document Revised: 02/20/2014 Document Reviewed: 05/05/2013 [facility] Patient Information 2015 [employer], [place]. This information is not intended to replace advice given to you by your health care provider. Make sure you discuss any questions you have with your health care provider.

## 2015-07-13 ENCOUNTER — Encounter: Payer: Self-pay | Admitting: Internal Medicine

## 2015-07-13 ENCOUNTER — Ambulatory Visit (INDEPENDENT_AMBULATORY_CARE_PROVIDER_SITE_OTHER): Payer: Federal, State, Local not specified - PPO | Admitting: Internal Medicine

## 2015-07-13 VITALS — BP 124/74 | Temp 98.1°F | Wt 275.0 lb

## 2015-07-13 DIAGNOSIS — E8881 Metabolic syndrome: Secondary | ICD-10-CM | POA: Diagnosis not present

## 2015-07-13 DIAGNOSIS — R635 Abnormal weight gain: Secondary | ICD-10-CM | POA: Diagnosis not present

## 2015-07-13 DIAGNOSIS — J4520 Mild intermittent asthma, uncomplicated: Secondary | ICD-10-CM

## 2015-07-13 DIAGNOSIS — E349 Endocrine disorder, unspecified: Secondary | ICD-10-CM | POA: Diagnosis not present

## 2015-07-13 DIAGNOSIS — R0600 Dyspnea, unspecified: Secondary | ICD-10-CM

## 2015-07-13 DIAGNOSIS — E88819 Insulin resistance, unspecified: Secondary | ICD-10-CM

## 2015-07-13 DIAGNOSIS — R7989 Other specified abnormal findings of blood chemistry: Secondary | ICD-10-CM

## 2015-07-13 DIAGNOSIS — IMO0001 Reserved for inherently not codable concepts without codable children: Secondary | ICD-10-CM

## 2015-07-13 DIAGNOSIS — R0603 Acute respiratory distress: Secondary | ICD-10-CM

## 2015-07-13 NOTE — Patient Instructions (Signed)
Your blood work is consistent with insulin resistance   (Risk to develop diabetes .  ) and elevated testosterone   Possible PCOS syndrome . Healthy weight loss can improve this. However there are other interventions that may be helpful for you. It can often help weight body hair and skin. Medication such as metformin and spironolactone. And even a change in the kind of OCP you were on.  Proceed with the abdominal and transvaginal ultrasound. We'll refer you to a specialist probably endocrinologist unless there is an abnormality on the ultrasound.  The episode this weekend sounded like asthma and stress. However some of these episodes could be upper airway wheezing or vocal cord dysfunction. That can be treated differently.  ROV in 3-4 months or as needed .

## 2015-07-13 NOTE — Progress Notes (Signed)
Pre visit review using our clinic review tool, if applicable. No additional management support is needed unless otherwise documented below in the visit note.    Chief Complaint  Patient presents with  . Follow-up     ed  Glenda Mendez     HPI: Glenda Mendez 24 y.o. comes in for fu labs  For weight gain  Other .  In the interim had to be seen in ed agaon of abd pain and lower chest pain.rx with neb as asthma   D dimer  Neg  Pt feels asthma attack and stress anxiety cause  She if better now  . No vomiting fever .   Had labs to review   Has seen nutritionist once   Here with mom now officially adopted  andname change ROS: See pertinent positives and negatives per HPI.  Past Medical History  Diagnosis Date  . Asthma   . Pulmonary hemorrhage of fetus or newborn     18 week C-section [facility] of with birth hypoxia  . Hypoxia of newborn   . Hearing loss in right ear     with hearing aid bilateral   . Hemiparesis     rt from neonatal period  . Allergy   . ADHD (attention deficit hyperactivity disorder)   . Irregular periods 06/04/2011  . BMI (body mass index), pediatric, 95-99% for age 70/10/2010    Is starting to gain weight again and advised and counseled today about reduction for health risk reasons. Patient is very well aware of how to do lifestyle intervention she has done this before.     Family History  Problem Relation Age of Onset  . Adopted: Yes  . Hypertension    . Schizophrenia Mother     with alcohol drug abuse  . Diabetes Other     Social History   Social History  . Marital Status: Single    Spouse Name: N/A  . Number of Children: N/A  . Years of Education: N/A   Social History Main Topics  . Smoking status: Never Smoker   . Smokeless tobacco: None  . Alcohol Use: No  . Drug Use: No  . Sexual Activity: Yes    Birth Control/ Protection: Pill   Other Topics Concern  . 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 Now stable and on prn       Thyroid evaluation in the past neck evaluation within normal limits and no thyroid disease.    Outpatient Prescriptions Prior to Visit  Medication Sig Dispense Refill  . albuterol (PROVENTIL HFA;VENTOLIN HFA) 108 (90 BASE) MCG/ACT inhaler Inhale 2 puffs into the lungs every 6 (six) hours as needed for wheezing or shortness of breath. 1 Inhaler 4  . albuterol (PROVENTIL) (2.5 MG/3ML) 0.083% nebulizer solution Take 3 mLs (2.5 mg total) by nebulization every 4 (four) hours as needed for wheezing or shortness of breath. 75 mL 2  . ciprofloxacin (CIPRO) 500 MG tablet Take 1 tablet (500 mg total) by mouth 2 (two) times daily. 14 tablet 0  . fluticasone-salmeterol (ADVAIR HFA) 230-21 MCG/ACT inhaler Inhale 2 puffs into the lungs 2 (two) times daily.    Marland Kitchen ibuprofen (ADVIL,MOTRIN) 200 MG tablet Take 200 mg  by mouth every 6 (six) hours as needed for headache.    . ipratropium (ATROVENT) 0.02 % nebulizer solution Take 0.5 mg by nebulization every 4 (four) hours as needed for wheezing or shortness of breath.    . ketoconazole (NIZORAL) 2 % cream Apply 1 application topically 2 (two) times daily. For axillary rash 30 g 1  . methylphenidate (CONCERTA) 36 MG PO CR tablet Take 1 tablet (36 mg total) by mouth daily. 60 tablet 0  . methylphenidate 36 MG PO CR tablet Take 1 tablet (36 mg total) by mouth daily. 60 tablet 0  . methylphenidate 36 MG PO CR tablet Take 2 tablets (72 mg total) by mouth daily. 60 tablet 0  . montelukast (SINGULAIR) 10 MG tablet Take 1 tablet (10 mg total) by mouth at bedtime. 90 tablet 3  . Multiple Vitamin (MULTIVITAMIN) tablet Take 1 tablet by mouth daily.    . norethindrone-ethinyl estradiol (JUNEL FE,GILDESS FE,LOESTRIN FE) 1-20 MG-MCG tablet Take 1 tablet by mouth daily. 1  Package 11  . omeprazole (PRILOSEC) 20 MG capsule Take 1 capsule (20 mg total) by mouth 2 (two) times daily before a meal. 60 capsule 3  . tiotropium (SPIRIVA) 18 MCG inhalation capsule Place 18 mcg into inhaler and inhale daily.    . traMADol (ULTRAM) 50 MG tablet Take 1 tablet (50 mg total) by mouth every 6 (six) hours as needed. (Patient taking differently: Take 50 mg by mouth every 6 (six) hours as needed for moderate pain or severe pain. ) 15 tablet 0  . fluticasone-salmeterol (ADVAIR HFA) 230-21 MCG/ACT inhaler Inhale 2 puffs into the lungs 2 (two) times daily.     Marland Kitchen tiotropium (SPIRIVA) 18 MCG inhalation capsule Place 18 mcg into inhaler and inhale daily.      No facility-administered medications prior to visit.     EXAM:  BP 124/74 mmHg  Temp(Src) 98.1 F (36.7 C) (Oral)  Wt 275 lb (124.739 kg)  LMP 06/04/2015  Body mass index is 43.06 kg/(m^2).  GENERAL: vitals reviewed and listed above, alert, oriented, appears well hydrated and in no acute distress well groomed HEENT: atraumatic, conjunctiva  clear, no obvious abnormalities on inspection of external nose and ears LUNGS: clear to auscultation bilaterally, no wheezes, rales or rhonchi,  No point tenderness no ruq tenderness today CV: HRRR, no clubbing cyanosis or   nl cap refill  MS: moves all extremities without noticeable focal  abnormality PSYCH: pleasant and cooperative, no obvious depression or anxiety  Wt Readings from Last 3 Encounters:  07/13/15 275 lb (124.739 kg)  07/11/15 275 lb (124.739 kg)  06/12/15 274 lb 9.6 oz (124.558 kg)   BP Readings from Last 3 Encounters:  07/13/15 124/74  07/11/15 123/70  06/12/15 126/70    ASSESSMENT AND PLAN:  Discussed the following assessment and plan:  Insulin resistance  Elevated testosterone level in female  Weight gain  Respiratory difficulty /intermittent attacks  - disc poss vcd anxiety and asthma interacting at a given time  this year has in ed visits new from  baseline keep appt pulm and see if can tease out causes.  Moderate intermittent asthma, uncomplicated Elevated testosterone on  ocps inc fasting insulin  T4H 6 cw metabolic  dysfunction (poss pcos type  ) disc  Keep appt with nutritionist  And abd transvag US and plan because of the abd pain off an on   Referral  Consider metformin vs spironolactone and or change ocps to  yazmin  re address stress  Poss anxiety at fu  But want to avoid [facility]   -Patient advised to return or notify health care team  if symptoms worsen ,persist or new concerns arise.  Patient Instructions  Your blood work is consistent with insulin resistance   (Risk to develop diabetes .  ) and elevated testosterone   Possible PCOS syndrome . Healthy weight loss can improve this. However there are other interventions that may be helpful for you. It can often help weight body hair and skin. Medication such as metformin and spironolactone. And even a change in the kind of OCP you were on.  Proceed with the abdominal and transvaginal ultrasound. We'll refer you to a specialist probably endocrinologist unless there is an abnormality on the ultrasound.  The episode this weekend sounded like asthma and stress. However some of these episodes could be upper airway wheezing or vocal cord dysfunction. That can be treated differently.  ROV in 3-4 months or as needed .    Standley Brooking. Panosh M.D.

## 2015-07-23 ENCOUNTER — Encounter: Payer: Self-pay | Admitting: *Deleted

## 2015-07-23 ENCOUNTER — Encounter: Payer: Federal, State, Local not specified - PPO | Attending: Internal Medicine | Admitting: *Deleted

## 2015-07-23 DIAGNOSIS — Z713 Dietary counseling and surveillance: Secondary | ICD-10-CM | POA: Diagnosis not present

## 2015-07-23 DIAGNOSIS — Z6841 Body Mass Index (BMI) 40.0 and over, adult: Secondary | ICD-10-CM | POA: Diagnosis not present

## 2015-07-23 DIAGNOSIS — E669 Obesity, unspecified: Secondary | ICD-10-CM | POA: Diagnosis present

## 2015-07-23 NOTE — Patient Instructions (Signed)
[facility]  [facility]- intern visits are low cost

## 2015-07-23 NOTE — Progress Notes (Signed)
  Medical Nutrition Therapy:  Appt start time: 4132 end time:  1600.   Assessment:  Primary concerns today: Glenda Mendez is here for follow up nutrition counseling pertaining to referral for obesity States she is trying to eat 3 meals/and and not skipping meals She is eating at the table more often She is still working on trying to eat more slowly States she is also trying to increase fruits and vegetables Trying to increase her water Trying to limit her sweets Is going to bed earlier Is exhausted from being in pain and being sick.  Isn't able to work and is emotional upset by her health.    States her sister does the cooking and sister isn't interested in healthier cooking options  She is pleased with her progress.  It's been challenging, especially making drink changes.  She spoke with her PCP about possibility for PCOS.  Also struggles with insomnia, and some kind of pain on her side.  This is going to be follow up by specialty.  Was seen by pulmonologist recently.   24 hour recall B: cereal L: [place] sandwich with nectarine D: stew with peanuts, carrots, chicken, tomatoes on rice Beverages: water, milk, soda  Today so far B: sausage and egg biscuit with water Nothing else     Preferred Learning Style:   No preference indicated   Learning Readiness:   Change in progress   MEDICATIONS: see list     Estimated energy needs: 2000-2200 calories    Nutritional Diagnosis:  NI-1.5 Excessive energy intake As related to mindless eating, frequent snacking, and excessive consumption sugary beverages.  As evidenced by dietary recall.    Intervention:  Nutrition counseling provided.  Praised Glenda Mendez for her progress and empowered her to continue.  Discussed need for therapy and she accepted referrals information for counseling.  Discussed strategies for healthy eating at home when her sister doesn't prepare healthy foods: always have a vegetable on the side (can use frozen options)  and ask for gravies/sauces to be served on the side so she can control the amount.   Continue previous goals: Aim to eat 3 meals/day and avoid meal skipping Aim to eat all food at the table without distractions Aim to eat more slowly and stop eating when comfortable, not stuffed Follow MyPlate recommendations for meal planning Limits sugary beverages  Teaching Method Utilized:  Auditory   Barriers to learning/adherence to lifestyle change: doesn't prepare own food  Demonstrated degree of understanding via:  Teach Back   Monitoring/Evaluation:  Dietary intake, exercise,  and body weight in 4 week(s).

## 2015-08-08 ENCOUNTER — Ambulatory Visit (INDEPENDENT_AMBULATORY_CARE_PROVIDER_SITE_OTHER): Payer: Federal, State, Local not specified - PPO | Admitting: Internal Medicine

## 2015-08-08 ENCOUNTER — Encounter: Payer: Self-pay | Admitting: Internal Medicine

## 2015-08-08 VITALS — BP 118/68 | HR 99 | Temp 98.1°F | Resp 12 | Ht 67.0 in | Wt 279.0 lb

## 2015-08-08 DIAGNOSIS — E349 Endocrine disorder, unspecified: Secondary | ICD-10-CM

## 2015-08-08 DIAGNOSIS — R7309 Other abnormal glucose: Secondary | ICD-10-CM

## 2015-08-08 DIAGNOSIS — R7989 Other specified abnormal findings of blood chemistry: Secondary | ICD-10-CM

## 2015-08-08 MED ORDER — METFORMIN HCL 500 MG PO TABS: 1000.0000 mg | ORAL_TABLET | Freq: Two times a day (BID) | ORAL | Status: DC

## 2015-08-08 NOTE — Progress Notes (Signed)
Patient ID: Glenda Mendez, female   DOB: 07-29-1991, 24 y.o.   MRN: [id number]  HPI: Glenda Mendez is a 24 y.o. female, referred by Dr Regis Bill, for evaluation for elevated testosterone and irregular menses (?PCOS).  Fertility/Menstrual cycles: - irregular menses, delayed by 2 weeks >> at least 6/year - menarche at 24 y/o - no h/o ovarian cysts - children: no - miscarriages: no - contraception: on OCPs - Junel Fe - started 3-4 years ago for PMS - + PMS - better on OCPs  Acne: - cystic acne - face and chest - saw dermatology at [facility]  Hirsutism: - sideburns, chin - wax  Weight gain: - + abdominal, mostly - + steroid use - multiple doses: from age 38 mo to 27 y/o for lung disease: "lived on Prednisone". After this, intermittently. Last year: lung ds worsened >> started steroid tapers again.  - no weight loss meds - Meals: - Breakfast: cereals or sausage + egg + grits - Lunch: yoghurt or sandwich - Dinner: leftovers or homecooked meal - Snacks: 1: yoghurt, fruit smoothie Drinks:  - Diets tried: last year - eating healthier, cutting down drinks  - sees Ozzie Hoyle (nutrition) - Exercise: gym, not regularly  Treatments tried: - did not try Metformin - did not try Spironolactone - did not try Vaniqua - + on OCPs  Other meds: - SSRIs: no  Reviewed pertinent tests: Component     Latest Ref Rng 07/03/2015  DHEA-SO4     18 - 391 ug/dL 247  Testosterone     15.00 - 40.00 ng/dL 65.26 (H)  17-OH-Progesterone, LC/MS/MS      47  Cortisol - AM     4.3 - 22.4 ug/dL 13.0   - Last thyroid tests: Lab Results  Component Value Date   TSH 3.18 04/20/2015   FREET4 0.7 01/15/2009   - Last set of lipids:    Component Value Date/Time   CHOL 171 04/20/2015 0944   TRIG 56.0 04/20/2015 0944   HDL 59.40 04/20/2015 0944   CHOLHDL 3 04/20/2015 0944   VLDL 11.2 04/20/2015 0944   LDLCALC 100* 04/20/2015 0944   - Last HbA1c - in the prediabetic range! Lab Results  Component Value  Date   HGBA1C 6.1 07/03/2015   She is adopted.  ROS: Constitutional: + weight gain, no fatigue, + both: subjective hyperthermia/hypothermia Eyes: no blurry vision, no xerophthalmia ENT: no sore throat, no nodules palpated in throat, + dysphagia/no odynophagia, no hoarseness, + tinnitus Cardiovascular: + CP/+ SOB/no palpitations/leg swelling Respiratory: + all: cough/SOB/wheezing Gastrointestinal:+ all: N/V/D/C/acid reflux Musculoskeletal: no muscle/joint aches Skin: + acne, + hair on face, + easy bruising, + itching Neurological: no tremors/numbness/tingling/dizziness, + HA Psychiatric: no depression/anxiety  Past Medical History  Diagnosis Date  . Asthma   . Pulmonary hemorrhage of fetus or newborn     9 week C-section [facility] of with birth hypoxia  . Hypoxia of newborn   . Hearing loss in right ear     with hearing aid bilateral   . Hemiparesis ([place])     rt from neonatal period  . Allergy   . ADHD (attention deficit hyperactivity disorder)   . Irregular periods 06/04/2011  . BMI (body mass index), pediatric, 95-99% for age 32/10/2010    Is starting to gain weight again and advised and counseled today about reduction for health risk reasons. Patient is very well aware of how to do lifestyle intervention she has done this before.    No past surgical  history on file. Social History   Social History  . Marital Status: Single    Spouse Name: N/A  . Number of Children: 1   Occupational History      Social History Main Topics  . Smoking status: Never Smoker   . Smokeless tobacco: Not on file  . Alcohol Use: No  . Drug Use: No  . Sexual Activity: Yes    Birth Control/ Protection: Pill   Social History Narrative   Adopted and raised by family member [name]   [employer] high school  graduate [place] [employer] second semester   Living at home with mom and has no car   Sleep 8+ hours   Seeing Dr. [name]- pulm at [facility] now has a new pulmonary Dr. Now stable and on prn        Thyroid evaluation in the past neck evaluation within normal limits and no thyroid disease.   Current Outpatient Prescriptions on File Prior to Visit  Medication Sig Dispense Refill  . albuterol (PROVENTIL HFA;VENTOLIN HFA) 108 (90 BASE) MCG/ACT inhaler Inhale 2 puffs into the lungs every 6 (six) hours as needed for wheezing or shortness of breath. 1 Inhaler 4  . albuterol (PROVENTIL) (2.5 MG/3ML) 0.083% nebulizer solution Take 3 mLs (2.5 mg total) by nebulization every 4 (four) hours as needed for wheezing or shortness of breath. 75 mL 2  . fluticasone-salmeterol (ADVAIR HFA) 230-21 MCG/ACT inhaler Inhale 2 puffs into the lungs 2 (two) times daily.    Marland Kitchen ibuprofen (ADVIL,MOTRIN) 200 MG tablet Take 200 mg by mouth every 6 (six) hours as needed for headache.    . ipratropium (ATROVENT) 0.02 % nebulizer solution Take 0.5 mg by nebulization every 4 (four) hours as needed for wheezing or shortness of breath.    . ketoconazole (NIZORAL) 2 % cream Apply 1 application topically 2 (two) times daily. For axillary rash 30 g 1  . methylphenidate (CONCERTA) 36 MG PO CR tablet Take 1 tablet (36 mg total) by mouth daily. 60 tablet 0  . methylphenidate 36 MG PO CR tablet Take 1 tablet (36 mg total) by mouth daily. 60 tablet 0  . methylphenidate 36 MG PO CR tablet Take 2 tablets (72 mg total) by mouth daily. 60 tablet 0  . montelukast (SINGULAIR) 10 MG tablet Take 1 tablet (10 mg total) by mouth at bedtime. 90 tablet 3  . Multiple Vitamin (MULTIVITAMIN) tablet Take 1 tablet by mouth daily.    . norethindrone-ethinyl estradiol (JUNEL FE,GILDESS FE,LOESTRIN FE) 1-20 MG-MCG tablet Take 1 tablet by mouth daily. 1 Package 11  . omeprazole (PRILOSEC) 20 MG capsule Take 1 capsule (20 mg total) by mouth 2 (two) times daily before a meal. 60 capsule 3  . tiotropium (SPIRIVA) 18 MCG inhalation capsule Place 18 mcg into inhaler and inhale daily.    . traMADol (ULTRAM) 50 MG tablet Take 1 tablet (50 mg total) by mouth every  6 (six) hours as needed. (Patient taking differently: Take 50 mg by mouth every 6 (six) hours as needed for moderate pain or severe pain. ) 15 tablet 0  . fluticasone-salmeterol (ADVAIR HFA) 230-21 MCG/ACT inhaler Inhale 2 puffs into the lungs 2 (two) times daily.     Marland Kitchen tiotropium (SPIRIVA) 18 MCG inhalation capsule Place 18 mcg into inhaler and inhale daily.      No current facility-administered medications on file prior to visit.   No Known Allergies Family History  Problem Relation Age of Onset  . Adopted: Yes  .  Hypertension    . Schizophrenia Mother     with alcohol drug abuse  . Diabetes Other    PE: BP 118/68 mmHg  Pulse 99  Temp(Src) 98.1 F (36.7 C) (Oral)  Resp 12  Ht 5\' 7"  (1.702 m)  Wt 279 lb (126.554 kg)  BMI 43.69 kg/m2  SpO2 98%  LMP 06/04/2015 Wt Readings from Last 3 Encounters:  08/08/15 279 lb (126.554 kg)  07/13/15 275 lb (124.739 kg)  07/11/15 275 lb (124.739 kg)   Constitutional: overweight, in NAD, no full supraclavicular fat pads Eyes: PERRLA, EOMI, no exophthalmos ENT: moist mucous membranes, no thyromegaly, no cervical lymphadenopathy Cardiovascular: RRR, No MRG Respiratory: CTA B Gastrointestinal: abdomen soft, NT, ND, BS+ Musculoskeletal: no deformities, strength intact in all 4 Skin: moist, warm; + acne on face, + dark terminal hair on chin, + vellum on sideburns, no skin tags, + pronounced acanthosis nigricans on neck and axillae, no purple, wide, stretch marks Neurological: no tremor with outstretched hands, DTR normal in all 4  ASSESSMENT: 1. Elevated testosterone level and irregular menses - Likely PCOS  PLAN: 1.  I had a long discussion with the patient and her mother about the fact that the PCOS is a misnomer, a patient does not necessarily have to have polycystic ovaries to be diagnosed with the disorder. This is of sum of several conditions, including:  weight gain  insulin resistance (and therefore a higher risk of developing  diabetes later in life)  acne  hirsutism  irregular menstrual cycles  decreased fertility. - We also discussed about the fact that the treatment is usually targeted to addressing the problem that concerns the patient the most: acne/hirsutism, weight gain, or fertility, but there is no single treatment for PCOS.  - The first-line therapy are oral contraceptives. If she is concerned with her weight, we can use metformin; if she is concerned about acne/hirsutism, we can add spironolactone; and if she is concerned about fertility, I could refer her to reproductive endocrinology for possible use of clomiphene. - As of now, the main problem is her weight, which is complicated by her many steroids courses for her chronic lung ds >> I suggested to start Metformin - target dose 1000 mg bid (start with 500 mg daily and advance the dose as tolerated). I strongly underlined the need for her to lose weight >> she will continue to work with the dietitian and I hope Metformin will help. - will check Testosterone, Prolactin and HbA1c (prev A1c in the prediabetic range) - reviewed previous pertinent labs along with pt and mother >> no thyroid ds, no CAH - we discussed about Spironolactone as a possible add-on to her regimen to help further with hirsutism and acne - will hold off for now  - time spent with the patient: 1 hour, of which >50% was spent in obtaining information about her symptoms, reviewing her previous labs, evaluations, and treatments, counseling her about her condition (please see the discussed topics above), and developing a plan to further investigate it; mother had a number of questions which I addressed.   Component     Latest Ref Rng 08/08/2015  Testosterone     10 - 70 ng/dL 82 (H)  Sex Hormone Binding     17 - 124 nmol/L 26  Testosterone Free     0.6 - 6.8 pg/mL 17.1 (H)  Testosterone-% Free     0.4 - 2.4 % 2.1  Prolactin      21.4   Prolactin  normal. Testosterone high, despite  taking Junel Fe. I would suggest to try Ortho-Cyclen, for a more powerful suppression of her testosterone.

## 2015-08-08 NOTE — Patient Instructions (Signed)
Please stop at the lab.  Please start Metformin 500 mg with dinner x 4 days. If you tolerate this well, add another Metformin tablet (500 mg) with breakfast x 4 days. If you tolerate this well, add another metformin tablet with dinner (total 1000 mg) x 4 days. If you tolerate this well, add another metformin tablet with breakfast (total 1000 mg). Continue with 1000 mg of metformin 2x a day with breakfast and dinner.  Please come back for a follow-up appointment in 6 months.  Polycystic Ovarian Syndrome Polycystic ovarian syndrome (PCOS) is a common hormonal disorder among women of reproductive age. Most women with PCOS grow many small cysts on their ovaries. PCOS can cause problems with your periods and make it difficult to get pregnant. It can also cause an increased risk of miscarriage with pregnancy. If left untreated, PCOS can lead to serious health problems, such as diabetes and heart disease. CAUSES The cause of PCOS is not fully understood, but genetics may be a factor. SIGNS AND SYMPTOMS   Infrequent or no menstrual periods.   Inability to get pregnant (infertility) because of not ovulating.   Increased growth of hair on the face, chest, stomach, back, thumbs, thighs, or toes.   Acne, oily skin, or dandruff.   Pelvic pain.   Weight gain or obesity, usually carrying extra weight around the waist.   Type 2 diabetes.   High cholesterol.   High blood pressure.   Female-pattern baldness or thinning hair.   Patches of thickened and dark brown or black skin on the neck, arms, breasts, or thighs.   Tiny excess flaps of skin (skin tags) in the armpits or neck area.   Excessive snoring and having breathing stop at times while asleep (sleep apnea).   Deepening of the voice.   Gestational diabetes when pregnant.  DIAGNOSIS  There is no single test to diagnose PCOS.   Your health care provider will:   Take a medical history.   Perform a pelvic exam.    Have ultrasonography done.   Check your female and female hormone levels.   Measure glucose or sugar levels in the blood.   Do other blood tests.   If you are producing too many female hormones, your health care provider will make sure it is from PCOS. At the physical exam, your health care provider will want to evaluate the areas of increased hair growth. Try to allow natural hair growth for a few days before the visit.   During a pelvic exam, the ovaries may be enlarged or swollen because of the increased number of small cysts. This can be seen more easily by using vaginal ultrasonography or screening to examine the ovaries and lining of the uterus (endometrium) for cysts. The uterine lining may become thicker if you have not been having a regular period.  TREATMENT  Because there is no cure for PCOS, it needs to be managed to prevent problems. Treatments are based on your symptoms. Treatment is also based on whether you want to have a baby or whether you need contraception.  Treatment may include:   Progesterone hormone to start a menstrual period.   Birth control pills to make you have regular menstrual periods.   Medicines to make you ovulate, if you want to get pregnant.   Medicines to control your insulin.   Medicine to control your blood pressure.   Medicine and diet to control your high cholesterol and triglycerides in your blood.  Medicine to reduce excessive  hair growth.  Surgery, making small holes in the ovary, to decrease the amount of female hormone production. This is done through a long, lighted tube (laparoscope) placed into the pelvis through a tiny incision in the lower abdomen.  HOME CARE INSTRUCTIONS  Only take over-the-counter or prescription medicine as directed by your health care provider.  Pay attention to the foods you eat and your activity levels. This can help reduce the effects of PCOS.  Keep your weight under control.  Eat foods that  are low in carbohydrate and high in fiber.  Exercise regularly. SEEK MEDICAL CARE IF:  Your symptoms do not get better with medicine.  You have new symptoms.   This information is not intended to replace advice given to you by your health care provider. Make sure you discuss any questions you have with your health care provider.   Document Released: 01/30/2005 Document Revised: 07/27/2013 Document Reviewed: 03/24/2013 Elsevier Interactive Patient Education [employer].

## 2015-08-09 LAB — TESTOSTERONE, FREE, TOTAL, SHBG
Sex Hormone Binding: 26 nmol/L (ref 17–124)
Testosterone, Free: 17.1 pg/mL — ABNORMAL HIGH (ref 0.6–6.8)
Testosterone-% Free: 2.1 % (ref 0.4–2.4)
Testosterone: 82 ng/dL — ABNORMAL HIGH (ref 10–70)

## 2015-08-09 LAB — PROLACTIN: Prolactin: 21.4 ng/mL

## 2015-08-23 ENCOUNTER — Ambulatory Visit: Payer: Federal, State, Local not specified - PPO | Admitting: *Deleted

## 2015-08-28 DIAGNOSIS — R7309 Other abnormal glucose: Secondary | ICD-10-CM | POA: Insufficient documentation

## 2015-08-28 DIAGNOSIS — R7989 Other specified abnormal findings of blood chemistry: Secondary | ICD-10-CM | POA: Insufficient documentation

## 2015-08-29 ENCOUNTER — Telehealth: Payer: Self-pay | Admitting: Internal Medicine

## 2015-08-29 NOTE — Telephone Encounter (Signed)
Returned pt's call. Advised her and her mother of Dr Arman Filter message:  Can you please read her my lab result message from 08/08/2015? She did not read it. If she agrees with OrthoCyclen, please send this to her pharmacy.  Also, the HbA1c was not resulted. Can you please check with the lab?  Pt's mother wants to research the Ortho-Cyclen first and she will call us back to advise.

## 2015-08-29 NOTE — Telephone Encounter (Signed)
Patient calling for the results of her labs °

## 2015-10-21 ENCOUNTER — Encounter (HOSPITAL_COMMUNITY): Payer: Self-pay | Admitting: Emergency Medicine

## 2015-10-21 ENCOUNTER — Emergency Department (HOSPITAL_COMMUNITY)
Admission: EM | Admit: 2015-10-21 | Discharge: 2015-10-22 | Disposition: A | Discharge status: Home / Self Care | Payer: Federal, State, Local not specified - PPO | Attending: Emergency Medicine | Admitting: Emergency Medicine

## 2015-10-21 DIAGNOSIS — F909 Attention-deficit hyperactivity disorder, unspecified type: Secondary | ICD-10-CM | POA: Insufficient documentation

## 2015-10-21 DIAGNOSIS — R000 Tachycardia, unspecified: Secondary | ICD-10-CM | POA: Insufficient documentation

## 2015-10-21 DIAGNOSIS — H6692 Otitis media, unspecified, left ear: Secondary | ICD-10-CM | POA: Insufficient documentation

## 2015-10-21 DIAGNOSIS — Z8639 Personal history of other endocrine, nutritional and metabolic disease: Secondary | ICD-10-CM | POA: Insufficient documentation

## 2015-10-21 DIAGNOSIS — Z7951 Long term (current) use of inhaled steroids: Secondary | ICD-10-CM | POA: Insufficient documentation

## 2015-10-21 DIAGNOSIS — J069 Acute upper respiratory infection, unspecified: Secondary | ICD-10-CM | POA: Diagnosis not present

## 2015-10-21 DIAGNOSIS — R1011 Right upper quadrant pain: Secondary | ICD-10-CM | POA: Insufficient documentation

## 2015-10-21 DIAGNOSIS — Z8742 Personal history of other diseases of the female genital tract: Secondary | ICD-10-CM | POA: Insufficient documentation

## 2015-10-21 DIAGNOSIS — Z7984 Long term (current) use of oral hypoglycemic drugs: Secondary | ICD-10-CM | POA: Diagnosis not present

## 2015-10-21 DIAGNOSIS — Z79899 Other long term (current) drug therapy: Secondary | ICD-10-CM | POA: Diagnosis not present

## 2015-10-21 DIAGNOSIS — R05 Cough: Secondary | ICD-10-CM | POA: Diagnosis present

## 2015-10-21 DIAGNOSIS — H9191 Unspecified hearing loss, right ear: Secondary | ICD-10-CM | POA: Insufficient documentation

## 2015-10-21 DIAGNOSIS — J45901 Unspecified asthma with (acute) exacerbation: Secondary | ICD-10-CM | POA: Insufficient documentation

## 2015-10-21 HISTORY — DX: Polycystic ovarian syndrome: E28.2

## 2015-10-21 MED ORDER — ONDANSETRON HCL 4 MG/2ML IJ SOLN
4.0000 mg | Freq: Once | INTRAMUSCULAR | Status: AC
  Administered 2015-10-21: 4 mg via INTRAVENOUS
  Filled 2015-10-21: qty 2

## 2015-10-21 MED ORDER — SODIUM CHLORIDE 0.9 % IV BOLUS (SEPSIS)
1000.0000 mL | Freq: Once | INTRAVENOUS | Status: AC
  Administered 2015-10-21: 1000 mL via INTRAVENOUS

## 2015-10-21 MED ORDER — MORPHINE SULFATE (PF) 4 MG/ML IV SOLN
4.0000 mg | Freq: Once | INTRAVENOUS | Status: AC
  Administered 2015-10-21: 4 mg via INTRAVENOUS
  Filled 2015-10-21: qty 1

## 2015-10-21 MED ORDER — PREDNISONE 20 MG PO TABS
60.0000 mg | ORAL_TABLET | Freq: Once | ORAL | Status: AC
  Administered 2015-10-21: 60 mg via ORAL
  Filled 2015-10-21: qty 3

## 2015-10-21 MED ORDER — KETOROLAC TROMETHAMINE 30 MG/ML IJ SOLN
30.0000 mg | Freq: Once | INTRAMUSCULAR | Status: AC
  Administered 2015-10-21: 30 mg via INTRAVENOUS
  Filled 2015-10-21: qty 1

## 2015-10-21 NOTE — ED Notes (Signed)
Pt is c/o upper respiratory sxs, cough, runny nose, congestion and wheezing that started yesterday  Pt states today she started having pain in her right flank area around 4pm then it went away but came back and is getting worse

## 2015-10-21 NOTE — ED Provider Notes (Signed)
CSN: [id number]     Arrival date & time 10/21/15  2241 History   First MD Initiated Contact with Patient 10/21/15 2310     Chief Complaint  Patient presents with  . Flank Pain  . URI   HPI   Glenda Mendez is a 25 y.o. F PMH significant for hearing loss in right ear, BL hearing aids, PCOS, seasonal allergies presenting with a 1 day history of non-productive cough, runny nose, congestion and wheezing. She endorses RUQ pain (8/10 pain scale, intermittent, sharp, non-radiating, chronic in nature, not associated with eating/BM). Her mother states she is usually seen at [facility] and has a follow-up with them this week but since the patient's pain was so severe, she decided to bring her in tonight. She has been seen for this pain in the past, and has received morphine with relief. She denies fevers, chills, N/V/D, CP, SOB.   Past Medical History  Diagnosis Date  . Asthma   . Pulmonary hemorrhage of fetus or newborn     11 week C-section [facility] of with birth hypoxia  . Hypoxia of newborn   . Hearing loss in right ear     with hearing aid bilateral   . Hemiparesis ([place])     rt from neonatal period  . Allergy   . ADHD (attention deficit hyperactivity disorder)   . Irregular periods 06/04/2011  . BMI (body mass index), pediatric, 95-99% for age 34/10/2010    Is starting to gain weight again and advised and counseled today about reduction for health risk reasons. Patient is very well aware of how to do lifestyle intervention she has done this before.   Marland Kitchen PCOS (polycystic ovarian syndrome)    Past Surgical History  Procedure Laterality Date  . Tonsillectomy     Family History  Problem Relation Age of Onset  . Adopted: Yes  . Hypertension    . Schizophrenia Mother     with alcohol drug abuse  . Diabetes Other    Social History  Substance Use Topics  . Smoking status: Never Smoker   . Smokeless tobacco: None  . Alcohol Use: No   OB History    No data available     Review of Systems  Ten  systems are reviewed and are negative for acute change except as noted in the HPI   Allergies  Review of patient's allergies indicates no known allergies.  Home Medications   Prior to Admission medications   Medication Sig Start Date End Date Taking? Authorizing Provider  albuterol (PROVENTIL HFA;VENTOLIN HFA) 108 (90 BASE) MCG/ACT inhaler Inhale 2 puffs into the lungs every 6 (six) hours as needed for wheezing or shortness of breath. 09/09/13   Burnis Medin, MD  albuterol (PROVENTIL) (2.5 MG/3ML) 0.083% nebulizer solution Take 3 mLs (2.5 mg total) by nebulization every 4 (four) hours as needed for wheezing or shortness of breath. 03/12/14   Tatyana Kirichenko, PA-C  fluticasone-salmeterol (ADVAIR HFA) 230-21 MCG/ACT inhaler Inhale 2 puffs into the lungs 2 (two) times daily.  05/24/14 05/24/15  Historical Provider, MD  fluticasone-salmeterol (ADVAIR HFA) 230-21 MCG/ACT inhaler Inhale 2 puffs into the lungs 2 (two) times daily.    Historical Provider, MD  ibuprofen (ADVIL,MOTRIN) 200 MG tablet Take 200 mg by mouth every 6 (six) hours as needed for headache.    Historical Provider, MD  ipratropium (ATROVENT) 0.02 % nebulizer solution Take 0.5 mg by nebulization every 4 (four) hours as needed for wheezing or shortness of breath.  Historical Provider, MD  ketoconazole (NIZORAL) 2 % cream Apply 1 application topically 2 (two) times daily. For axillary rash 06/12/15   Burnis Medin, MD  metFORMIN (GLUCOPHAGE) 500 MG tablet Take 2 tablets (1,000 mg total) by mouth 2 (two) times daily with a meal. 08/08/15   Philemon Kingdom, MD  methylphenidate (CONCERTA) 36 MG PO CR tablet Take 1 tablet (36 mg total) by mouth daily. 06/05/15   Burnis Medin, MD  methylphenidate 36 MG PO CR tablet Take 1 tablet (36 mg total) by mouth daily. 06/05/15   Burnis Medin, MD  methylphenidate 36 MG PO CR tablet Take 2 tablets (72 mg total) by mouth daily. 06/05/15   Burnis Medin, MD  montelukast (SINGULAIR) 10 MG tablet Take  1 tablet (10 mg total) by mouth at bedtime. 01/02/15   Burnis Medin, MD  Multiple Vitamin (MULTIVITAMIN) tablet Take 1 tablet by mouth daily.    Historical Provider, MD  norethindrone-ethinyl estradiol (JUNEL FE,GILDESS FE,LOESTRIN FE) 1-20 MG-MCG tablet Take 1 tablet by mouth daily. 07/21/14   Burnis Medin, MD  omeprazole (PRILOSEC) 20 MG capsule Take 1 capsule (20 mg total) by mouth 2 (two) times daily before a meal. 06/20/14   Burnis Medin, MD  tiotropium (SPIRIVA) 18 MCG inhalation capsule Place 18 mcg into inhaler and inhale daily.  05/24/14 05/24/15  Historical Provider, MD  tiotropium (SPIRIVA) 18 MCG inhalation capsule Place 18 mcg into inhaler and inhale daily.    Historical Provider, MD  traMADol (ULTRAM) 50 MG tablet Take 1 tablet (50 mg total) by mouth every 6 (six) hours as needed. Patient taking differently: Take 50 mg by mouth every 6 (six) hours as needed for moderate pain or severe pain.  05/20/15   Jennifer Piepenbrink, PA-C   BP 134/63 mmHg  Pulse 100  SpO2 98%  LMP 10/17/2015 (Exact Date) Physical Exam  Constitutional: She appears well-developed and well-nourished. No distress.  HENT:  Head: Normocephalic and atraumatic.  Right Ear: External ear normal.  Left Ear: External ear normal.  Nose: Nose normal.  Mouth/Throat: Oropharynx is clear and moist. No oropharyngeal exudate.  Erythematous, bulging TM in left middle ear.  Eyes: Conjunctivae are normal. Pupils are equal, round, and reactive to light. Right eye exhibits no discharge. Left eye exhibits no discharge. No scleral icterus.  Neck: No tracheal deviation present.  Cardiovascular: Normal rate, regular rhythm, normal heart sounds and intact distal pulses.  Exam reveals no gallop and no friction rub.   No murmur heard. Pulmonary/Chest: Effort normal and breath sounds normal. No respiratory distress. She has no wheezes. She has no rales. She exhibits no tenderness.  Abdominal: Soft. Bowel sounds are normal. She exhibits  no distension and no mass. There is tenderness. There is no rebound and no guarding.  RUQ tenderness, negative murphy's  Musculoskeletal: She exhibits no edema.  Lymphadenopathy:    She has no cervical adenopathy.  Neurological: She is alert. Coordination normal.  Skin: Skin is warm and dry. No rash noted. She is not diaphoretic. No erythema.  Psychiatric: She has a normal mood and affect. Her behavior is normal.  Nursing note and vitals reviewed.   ED Course  Procedures  MDM   Final diagnoses:  Acute left otitis media, recurrence not specified, unspecified otitis media type  Upper respiratory infection   Patient non-toxic appearing and VSS. Slight tachycardia, which is chronic for her, per prior record review. She is presenting with URI symptoms, left otitis media, and chronic  abdominal pain. Patient feeling better after prednisone, IVF, toradol, morphine, and zofran. Patient may be safely discharged home with vicodin and amoxicillin. Discussed reasons for return. Patient to follow-up with primary care provider within one week. Explained she should follow-up with her PCP regarding her chronic pain, as the ER is not designed for managing this. Patient in understanding and agreement with the plan.   [place] Lions, PA-C 10/28/15 2304  Veatrice Kells, MD 11/14/15 2329

## 2015-10-22 MED ORDER — HYDROCODONE-ACETAMINOPHEN 5-325 MG PO TABS: 2.0000 | ORAL_TABLET | Freq: Four times a day (QID) | ORAL | Status: DC | PRN

## 2015-10-22 MED ORDER — AMOXICILLIN 500 MG PO CAPS: 500.0000 mg | ORAL_CAPSULE | Freq: Three times a day (TID) | ORAL | Status: DC

## 2015-10-22 NOTE — Discharge Instructions (Signed)
Ms. Glenda Mendez (and mom),  Nice meeting you! Please follow-up with your primary care provider within a few days. Return to the emergency department if you continue to have shortness of breath, increased abdominal pain, or have worsening symptoms. Feel better soon!  S. Wendie Simmer, PA-C

## 2015-10-22 NOTE — ED Notes (Signed)
Patient was alert, oriented and stable upon discharge. RN went over AVS and patient had no further questions.  

## 2015-10-26 ENCOUNTER — Encounter: Payer: Self-pay | Admitting: Internal Medicine

## 2015-10-26 ENCOUNTER — Ambulatory Visit (INDEPENDENT_AMBULATORY_CARE_PROVIDER_SITE_OTHER): Payer: Federal, State, Local not specified - PPO | Admitting: Internal Medicine

## 2015-10-26 VITALS — BP 128/80 | HR 100 | Temp 98.2°F | Ht 67.0 in | Wt 274.0 lb

## 2015-10-26 DIAGNOSIS — R109 Unspecified abdominal pain: Secondary | ICD-10-CM

## 2015-10-26 DIAGNOSIS — H6502 Acute serous otitis media, left ear: Secondary | ICD-10-CM

## 2015-10-26 DIAGNOSIS — J45901 Unspecified asthma with (acute) exacerbation: Secondary | ICD-10-CM | POA: Diagnosis not present

## 2015-10-26 DIAGNOSIS — J069 Acute upper respiratory infection, unspecified: Secondary | ICD-10-CM | POA: Diagnosis not present

## 2015-10-26 LAB — POCT URINALYSIS DIPSTICK
Bilirubin, UA: NEGATIVE
Glucose, UA: NEGATIVE
Ketones, UA: NEGATIVE
Leukocytes, UA: NEGATIVE
Nitrite, UA: NEGATIVE
Spec Grav, UA: >=1.03
Urobilinogen, UA: 0.2
pH, UA: 5.5

## 2015-10-26 NOTE — Progress Notes (Signed)
Chief Complaint  Patient presents with  . ER Follow Up  . lump in throat    HPI: Patient Glenda Mendez  comes in today for SDA for  new problem evaluation. Fu ED visit jan 1 for ear infection and side pain? Acute onset of ur congestion eyes red   Given home  Throat was scratchy .  And then jan 1 she got worse.  No fever.    Cannot find finish  competed note  to review  Just template  So hx from patient and mom  Alone  Given amox 500 tid for   Ear infection but no ear pain. St and congestion and hoarse .Marland Kitchen   Coughing up yellow phlegm some  Prednisone 40 x 2 days and ed gave her 60 pred.  .  Now off. Cough looser mom thinks better  Still coughing and runny nose .   Some osre spot in mouth  sid right began with the cough again  No radiation no uti sx  On menses now.  Has fu ears and pulm in the near future  Mom repoirts less emergencies and ed visits since on   nucala . ROS: See pertinent positives and negatives per HPI.  Past Medical History  Diagnosis Date  . Asthma   . Pulmonary hemorrhage of fetus or newborn     3 week C-section [facility] of with birth hypoxia  . Hypoxia of newborn   . Hearing loss in right ear     with hearing aid bilateral   . Hemiparesis ([place])     rt from neonatal period  . Allergy   . ADHD (attention deficit hyperactivity disorder)   . Irregular periods 06/04/2011  . BMI (body mass index), pediatric, 95-99% for age 39/10/2010    Is starting to gain weight again and advised and counseled today about reduction for health risk reasons. Patient is very well aware of how to do lifestyle intervention she has done this before.   Marland Kitchen PCOS (polycystic ovarian syndrome)     Family History  Problem Relation Age of Onset  . Adopted: Yes  . Hypertension    . Schizophrenia Mother     with alcohol drug abuse  . Diabetes Other     Social History   Social History  . Marital Status: Single    Spouse Name: N/A  . Number of Children: N/A  . Years of Education: N/A    Social History Main Topics  . Smoking status: Never Smoker   . Smokeless tobacco: None  . Alcohol Use: No  . Drug Use: No  . Sexual Activity: Yes    Birth Control/ Protection: Pill   Other Topics Concern  . None   Social History Narrative   Adopted and raised by family member [name]   [employer] high school  graduate [employer] second semester   Living at home with mom and has no car   Sleep 8+ hours   Seeing Dr. [name]- pulm at [facility] now has a new pulmonary Dr. Now stable and on prn       Thyroid evaluation in the past neck evaluation within normal limits and no thyroid disease.    Outpatient Prescriptions Prior to Visit  Medication Sig Dispense Refill  . albuterol (PROVENTIL HFA;VENTOLIN HFA) 108 (90 BASE) MCG/ACT inhaler Inhale 2 puffs into the lungs every 6 (six) hours as needed for wheezing or shortness of breath. 1 Inhaler 4  . albuterol (PROVENTIL) (2.5 MG/3ML) 0.083% nebulizer  solution Take 3 mLs (2.5 mg total) by nebulization every 4 (four) hours as needed for wheezing or shortness of breath. 75 mL 2  . amoxicillin (AMOXIL) 500 MG capsule Take 1 capsule (500 mg total) by mouth 3 (three) times daily. 30 capsule 0  . fluticasone-salmeterol (ADVAIR HFA) 230-21 MCG/ACT inhaler Inhale 2 puffs into the lungs 2 (two) times daily.    Marland Kitchen ipratropium (ATROVENT) 0.02 % nebulizer solution Take 0.5 mg by nebulization every 4 (four) hours as needed for wheezing or shortness of breath.    . ketoconazole (NIZORAL) 2 % cream Apply 1 application topically 2 (two) times daily. For axillary rash 30 g 1  . Mepolizumab (NUCALA [place]) Inject 1 Syringe into the skin every 28 (twenty-eight) days.    . metFORMIN (GLUCOPHAGE) 500 MG tablet Take 2 tablets (1,000 mg total) by mouth 2 (two) times daily with a meal. 120 tablet 5  . methylphenidate (CONCERTA) 36 MG PO CR tablet Take 1 tablet (36 mg total) by mouth daily. 60 tablet 0  . methylphenidate 36 MG PO CR tablet Take 1 tablet (36 mg total)  by mouth daily. 60 tablet 0  . methylphenidate 36 MG PO CR tablet Take 2 tablets (72 mg total) by mouth daily. 60 tablet 0  . montelukast (SINGULAIR) 10 MG tablet Take 1 tablet (10 mg total) by mouth at bedtime. 90 tablet 3  . Multiple Vitamin (MULTIVITAMIN) tablet Take 1 tablet by mouth daily.    . norethindrone-ethinyl estradiol (JUNEL FE,GILDESS FE,LOESTRIN FE) 1-20 MG-MCG tablet Take 1 tablet by mouth daily. 1 Package 11  . tiotropium (SPIRIVA) 18 MCG inhalation capsule Place 18 mcg into inhaler and inhale daily.    . traMADol (ULTRAM) 50 MG tablet Take 1 tablet (50 mg total) by mouth every 6 (six) hours as needed. (Patient taking differently: Take 50 mg by mouth every 6 (six) hours as needed for moderate pain or severe pain. ) 15 tablet 0  . HYDROcodone-acetaminophen (NORCO/VICODIN) 5-325 MG tablet Take 2 tablets by mouth every 6 (six) hours as needed. (Patient not taking: Reported on 10/26/2015) 10 tablet 0  . omeprazole (PRILOSEC) 20 MG capsule Take 1 capsule (20 mg total) by mouth 2 (two) times daily before a meal. (Patient not taking: Reported on 10/26/2015) 60 capsule 3  . predniSONE (DELTASONE) 10 MG tablet Take 40 mg by mouth daily as needed. Reported on 10/26/2015     No facility-administered medications prior to visit.     EXAM:  BP 128/80 mmHg  Pulse 100  Temp(Src) 98.2 F (36.8 C) (Oral)  Ht 5\' 7"  (1.702 m)  Wt 274 lb (124.286 kg)  BMI 42.90 kg/m2  SpO2 98%  LMP 10/17/2015 (Exact Date)  Body mass index is 42.9 kg/(m^2).  GENERAL: vitals reviewed and listed above, alert, oriented, appears well hydrated and in no acute distress mild hoarse   croup like but cough looser  Nl respirations  HEENT: atraumatic, conjunctiva  clear,  righ tms nl left tm nl lm but some clear fluid s;lighe inferior  Face non tender nose stuffy OP : no  edema or exudate  Small pinpoint red area left post soft palate (? Early ulcer or lesion?  NECK: no obvious masses on inspection palpation  No adenopathy   LUNGS: clear to auscultation bilaterally, no wheezes, rales or rhonchi, pretty good air movement for her .  CV: HRRR, no clubbing cyanosis ornl cap refill  Abdomen:  Sof,t normal bowel sounds without hepatosplenomegaly, no guarding rebound or masses  no CVA tenderness MS: moves all extremities without noticeable focal  abnormality PSYCH: pleasant and cooperative,  ua neg except blood ( on period) ASSESSMENT AND PLAN:  Discussed the following assessment and plan:  Asthma with acute exacerbation, unspecified asthma severity - improved   Acute serous otitis media of left ear, recurrence not specified - resolving   URI, acute - prob viral trigger  no notee  to review  Just template  So hx from patient  And mom  Alone Did not  take the hydrocodone   Seems to be ing better  Mom feels much better  Than usual need for emt  .   3 days steroids and antibiotic    Expectant management.  And care   Let pulmonary know status  .   -Patient advised to return or notify health care team  if symptoms worsen ,persist or new concerns arise.  Patient Instructions  Elizebeth Koller the antibiotic . As discussed   Asthma meds as planned   Ear looks like mild fluid .Marland Kitchen  Expect to resolve .  I agree the side pain is related to coughing ans asthma attack.        Standley Brooking. Panosh M.D.

## 2015-10-26 NOTE — Patient Instructions (Signed)
Finish the antibiotic . As discussed   Asthma meds as planned   Ear looks like mild fluid .Marland Kitchen  Expect to resolve .  I agree the side pain is related to coughing ans asthma attack.

## 2015-10-26 NOTE — Addendum Note (Signed)
Addended by: Colleen Can on: 10/26/2015 04:48 PM   Modules accepted: Orders

## 2015-11-07 ENCOUNTER — Encounter: Payer: Self-pay | Admitting: Internal Medicine

## 2015-11-07 ENCOUNTER — Ambulatory Visit (INDEPENDENT_AMBULATORY_CARE_PROVIDER_SITE_OTHER): Payer: Federal, State, Local not specified - PPO | Admitting: Internal Medicine

## 2015-11-07 VITALS — BP 130/70 | Temp 98.3°F | Wt 277.5 lb

## 2015-11-07 DIAGNOSIS — E349 Endocrine disorder, unspecified: Secondary | ICD-10-CM

## 2015-11-07 DIAGNOSIS — Z79899 Other long term (current) drug therapy: Secondary | ICD-10-CM

## 2015-11-07 DIAGNOSIS — J4520 Mild intermittent asthma, uncomplicated: Secondary | ICD-10-CM

## 2015-11-07 DIAGNOSIS — F909 Attention-deficit hyperactivity disorder, unspecified type: Secondary | ICD-10-CM

## 2015-11-07 DIAGNOSIS — R7989 Other specified abnormal findings of blood chemistry: Secondary | ICD-10-CM

## 2015-11-07 DIAGNOSIS — IMO0001 Reserved for inherently not codable concepts without codable children: Secondary | ICD-10-CM

## 2015-11-07 MED ORDER — METHYLPHENIDATE HCL ER (OSM) 36 MG PO TBCR: EXTENDED_RELEASE_TABLET | ORAL | Status: DC

## 2015-11-07 MED ORDER — NORGESTIMATE-ETH ESTRADIOL 0.25-35 MG-MCG PO TABS: 1.0000 | ORAL_TABLET | Freq: Every day | ORAL | Status: DC

## 2015-11-07 MED ORDER — METHYLPHENIDATE HCL ER 36 MG PO TB24: 36.0000 mg | ORAL_TABLET | Freq: Every day | ORAL | Status: DC

## 2015-11-07 MED ORDER — METHYLPHENIDATE HCL ER (OSM) 36 MG PO TBCR: 72.0000 mg | EXTENDED_RELEASE_TABLET | Freq: Every day | ORAL | Status: DC

## 2015-11-07 MED ORDER — METHYLPHENIDATE HCL ER (OSM) 36 MG PO TBCR
36.0000 mg | EXTENDED_RELEASE_TABLET | Freq: Every day | ORAL | Status: DC
Start: 2015-11-07 — End: 2016-03-07

## 2015-11-07 MED ORDER — METHYLPHENIDATE HCL ER 36 MG PO TB24: ORAL_TABLET | ORAL | Status: DC

## 2015-11-07 NOTE — Progress Notes (Signed)
Pre visit review using our clinic review tool, if applicable. No additional management support is needed unless otherwise documented below in the visit note.  Chief Complaint  Patient presents with  . Follow-up    ocp etc  . ADHD    HPI: Glenda Mendez 25 y.o.  comes in for chronic disease/ medication management  Here with mom also    Asthma stable so far . appt at [facility] .  Right side pain again this weekend when laughing but asked to go home  No sob cough with this and more sore to touch than anything.   On ocps and metformin and has fu with dr Darnell Level    Still problem with chin facial hair  .   Feels anxiety is ok  Not that bada t present  adhd still feels gets efect from med and mom feels better on than off.  Needs srefill  ROS: See pertinent positives and negatives per HPI.  Past Medical History  Diagnosis Date  . Asthma   . Pulmonary hemorrhage of fetus or newborn     18 week C-section [facility] of with birth hypoxia  . Hypoxia of newborn   . Hearing loss in right ear     with hearing aid bilateral   . Hemiparesis ([place])     rt from neonatal period  . Allergy   . ADHD (attention deficit hyperactivity disorder)   . Irregular periods 06/04/2011  . BMI (body mass index), pediatric, 95-99% for age 57/10/2010    Is starting to gain weight again and advised and counseled today about reduction for health risk reasons. Patient is very well aware of how to do lifestyle intervention she has done this before.   Marland Kitchen PCOS (polycystic ovarian syndrome)     Family History  Problem Relation Age of Onset  . Adopted: Yes  . Hypertension    . Schizophrenia Mother     with alcohol drug abuse  . Diabetes Other     Social History   Social History  . Marital Status: Single    Spouse Name: N/A  . Number of Children: N/A  . Years of Education: N/A   Social History Main Topics  . Smoking status: Never Smoker   . Smokeless tobacco: None  . Alcohol Use: No  . Drug Use: No  . Sexual Activity:  Yes    Birth Control/ Protection: Pill   Other Topics Concern  . 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 [name]- pulm at [facility] now has a new pulmonary Dr. Now stable and on prn       Thyroid evaluation in the past neck evaluation within normal limits and no thyroid disease.    Outpatient Prescriptions Prior to Visit  Medication Sig Dispense Refill  . albuterol (PROVENTIL HFA;VENTOLIN HFA) 108 (90 BASE) MCG/ACT inhaler Inhale 2 puffs into the lungs every 6 (six) hours as needed for wheezing or shortness of breath. 1 Inhaler 4  . albuterol (PROVENTIL) (2.5 MG/3ML) 0.083% nebulizer solution Take 3 mLs (2.5 mg total) by nebulization every 4 (four) hours as needed for wheezing or shortness of breath. 75 mL 2  . fluticasone-salmeterol (ADVAIR HFA) 230-21 MCG/ACT inhaler Inhale 2 puffs into the lungs 2 (two) times daily.    Marland Kitchen  HYDROcodone-acetaminophen (NORCO/VICODIN) 5-325 MG tablet Take 2 tablets by mouth every 6 (six) hours as needed. 10 tablet 0  . ipratropium (ATROVENT) 0.02 % nebulizer solution Take 0.5 mg by nebulization every 4 (four) hours as needed for wheezing or shortness of breath.    . ketoconazole (NIZORAL) 2 % cream Apply 1 application topically 2 (two) times daily. For axillary rash 30 g 1  . Mepolizumab (NUCALA [place]) Inject 1 Syringe into the skin every 28 (twenty-eight) days.    . metFORMIN (GLUCOPHAGE) 500 MG tablet Take 2 tablets (1,000 mg total) by mouth 2 (two) times daily with a meal. 120 tablet 5  . montelukast (SINGULAIR) 10 MG tablet Take 1 tablet (10 mg total) by mouth at bedtime. 90 tablet 3  . Multiple Vitamin (MULTIVITAMIN) tablet Take 1 tablet by mouth daily.    . norethindrone-ethinyl estradiol (JUNEL FE,GILDESS FE,LOESTRIN FE) 1-20 MG-MCG tablet Take 1 tablet by mouth daily. 1 Package 11    . omeprazole (PRILOSEC) 20 MG capsule Take 1 capsule (20 mg total) by mouth 2 (two) times daily before a meal. 60 capsule 3  . tiotropium (SPIRIVA) 18 MCG inhalation capsule Place 18 mcg into inhaler and inhale daily.    . methylphenidate (CONCERTA) 36 MG PO CR tablet Take 1 tablet (36 mg total) by mouth daily. 60 tablet 0  . methylphenidate 36 MG PO CR tablet Take 1 tablet (36 mg total) by mouth daily. 60 tablet 0  . methylphenidate 36 MG PO CR tablet Take 2 tablets (72 mg total) by mouth daily. 60 tablet 0  . amoxicillin (AMOXIL) 500 MG capsule Take 1 capsule (500 mg total) by mouth 3 (three) times daily. 30 capsule 0   No facility-administered medications prior to visit.     EXAM:  BP 130/70 mmHg  Temp(Src) 98.3 F (36.8 C) (Oral)  Wt 277 lb 8 oz (125.873 kg)  LMP 10/17/2015 (Exact Date)  Body mass index is 43.45 kg/(m^2).  GENERAL: vitals reviewed and listed above, alert, oriented, appears well hydrated and in no acute distress here with mom  HEENT: atraumatic, conjunctiva  clear, no obvious abnormalities on inspection of external nose and ears OP : no lesion edema or exudate  NECK: no obvious masses on inspection palpation  LUNGS: clear to auscultation bilaterally, no wheezes, rales or rhonchi,   Sore spot right lateral chest wall no crepitus   CV: HRRR, no clubbing cyanosis or  peripheral edema nl cap refill  Skin no stria lower chin hair  Min facial acne  But rashss on arm chest MS: moves all extremities without noticeable focal  abnormality PSYCH: pleasant and cooperative, no obvious depression or anxiety  ASSESSMENT AND PLAN:  Discussed the following assessment and plan:  Attention deficit hyperactivity disorder (ADHD), unspecified ADHD type - benefit more than risk of med continue  Morbid obesity, unspecified obesity type ([place]) - stay on metformin  further adjustment as per endo  Medication management  Moderate intermittent asthma, uncomplicated  Elevated  testosterone level in female - disc uinque agrees to try orthocyclen    for reasons explained  keep fu appt with dr Renne Crigler may take 3 months to see clinical effects  -Patient advised to return or notify health care team  if symptoms worsen ,persist or new concerns arise. Total visit 63mins > 50% spent counseling and coordinating care as indicated in above note and in instructions to patient .  Patient Instructions  Continue  concerta  Trial   changing to ortho cyclen  To see if helps suppress the testosterone and help hair and acne issues  Keep fu appt with dr Cruzita Lederer.  ROV with me in 6 months  I think the side pain  That comes with cough and  Laughing is probably chest wall pain   Fu if  Worse or changing.   ROV in 6 months   Or as needed      Mariann Laster K. Parsons Paone M.D.

## 2015-11-07 NOTE — Patient Instructions (Signed)
Continue  concerta  Trial   changing to ortho cyclen   To see if helps suppress the testosterone and help hair and acne issues  Keep fu appt with dr Cruzita Lederer.  ROV with me in 6 months  I think the side pain  That comes with cough and  Laughing is probably chest wall pain   Fu if  Worse or changing.   ROV in 6 months   Or as needed

## 2015-11-26 ENCOUNTER — Telehealth: Payer: Self-pay | Admitting: Internal Medicine

## 2015-11-26 NOTE — Telephone Encounter (Signed)
Ok x 1 Did she missplace all 3 months worth or only one?

## 2015-11-26 NOTE — Telephone Encounter (Signed)
Left a message on listed number informing Mariann Laster (mother) will discuss when she comes in for lab work.

## 2015-11-26 NOTE — Telephone Encounter (Signed)
Mom states she has lost pt's rx for methylphenidate (CONCERTA) 36 MG PO CR tablet Wants to know if she can get a new script. Mom has looked and looked and cannot fine. Mom states she lost, not the pt.  Mom is coming for labs in the morning and would like to pick up then if she can.

## 2015-11-27 MED ORDER — METHYLPHENIDATE HCL ER 36 MG PO TB24: 36.0000 mg | ORAL_TABLET | Freq: Every day | ORAL | Status: DC

## 2015-11-27 NOTE — Telephone Encounter (Signed)
Michiel Sites one prescription per [facility].  In hope she will find the other prescriptions.  She will call back as needed.

## 2016-01-17 ENCOUNTER — Telehealth: Payer: Self-pay | Admitting: Internal Medicine

## 2016-01-17 NOTE — Telephone Encounter (Signed)
Pt has found her Rx. No need to refill at this time.

## 2016-01-17 NOTE — Telephone Encounter (Signed)
Pt needs new rx generic concerta 36 mg °

## 2016-01-27 DIAGNOSIS — R069 Unspecified abnormalities of breathing: Secondary | ICD-10-CM | POA: Diagnosis not present

## 2016-02-06 ENCOUNTER — Ambulatory Visit: Payer: Federal, State, Local not specified - PPO | Admitting: Internal Medicine

## 2016-03-07 ENCOUNTER — Encounter (HOSPITAL_COMMUNITY): Payer: Self-pay | Admitting: Emergency Medicine

## 2016-03-07 ENCOUNTER — Emergency Department (HOSPITAL_COMMUNITY): Payer: Federal, State, Local not specified - PPO

## 2016-03-07 ENCOUNTER — Emergency Department (HOSPITAL_COMMUNITY)
Admission: EM | Admit: 2016-03-07 | Discharge: 2016-03-07 | Disposition: A | Discharge status: Home / Self Care | Payer: Federal, State, Local not specified - PPO | Attending: Emergency Medicine | Admitting: Emergency Medicine

## 2016-03-07 DIAGNOSIS — Z8669 Personal history of other diseases of the nervous system and sense organs: Secondary | ICD-10-CM | POA: Insufficient documentation

## 2016-03-07 DIAGNOSIS — Z8742 Personal history of other diseases of the female genital tract: Secondary | ICD-10-CM | POA: Insufficient documentation

## 2016-03-07 DIAGNOSIS — Z79899 Other long term (current) drug therapy: Secondary | ICD-10-CM | POA: Diagnosis not present

## 2016-03-07 DIAGNOSIS — Z8639 Personal history of other endocrine, nutritional and metabolic disease: Secondary | ICD-10-CM | POA: Diagnosis not present

## 2016-03-07 DIAGNOSIS — J45901 Unspecified asthma with (acute) exacerbation: Secondary | ICD-10-CM

## 2016-03-07 DIAGNOSIS — F909 Attention-deficit hyperactivity disorder, unspecified type: Secondary | ICD-10-CM | POA: Diagnosis not present

## 2016-03-07 DIAGNOSIS — Z7984 Long term (current) use of oral hypoglycemic drugs: Secondary | ICD-10-CM | POA: Insufficient documentation

## 2016-03-07 DIAGNOSIS — Z793 Long term (current) use of hormonal contraceptives: Secondary | ICD-10-CM | POA: Diagnosis not present

## 2016-03-07 DIAGNOSIS — H9191 Unspecified hearing loss, right ear: Secondary | ICD-10-CM | POA: Diagnosis not present

## 2016-03-07 DIAGNOSIS — R079 Chest pain, unspecified: Secondary | ICD-10-CM | POA: Diagnosis present

## 2016-03-07 DIAGNOSIS — R0602 Shortness of breath: Secondary | ICD-10-CM | POA: Diagnosis not present

## 2016-03-07 LAB — CBC WITH DIFFERENTIAL/PLATELET
Basophils Absolute: 0 10*3/uL (ref 0.0–0.1)
Basophils Relative: 0 %
Eosinophils Absolute: 0 10*3/uL (ref 0.0–0.7)
Eosinophils Relative: 0 %
HCT: 37.9 % (ref 36.0–46.0)
Hemoglobin: 11.6 g/dL — ABNORMAL LOW (ref 12.0–15.0)
Lymphocytes Relative: 26 %
Lymphs Abs: 2.4 10*3/uL (ref 0.7–4.0)
MCH: 21.4 pg — ABNORMAL LOW (ref 26.0–34.0)
MCHC: 30.6 g/dL (ref 30.0–36.0)
MCV: 70.1 fL — ABNORMAL LOW (ref 78.0–100.0)
Monocytes Absolute: 0.3 10*3/uL (ref 0.1–1.0)
Monocytes Relative: 3 %
Neutro Abs: 6.7 10*3/uL (ref 1.7–7.7)
Neutrophils Relative %: 71 %
Platelets: 357 10*3/uL (ref 150–400)
RBC: 5.41 MIL/uL — ABNORMAL HIGH (ref 3.87–5.11)
RDW: 15.4 % (ref 11.5–15.5)
WBC: 9.4 10*3/uL (ref 4.0–10.5)

## 2016-03-07 LAB — BASIC METABOLIC PANEL
Anion gap: 13 (ref 5–15)
BUN: 11 mg/dL (ref 6–20)
CO2: 22 mmol/L (ref 22–32)
Calcium: 8.8 mg/dL — ABNORMAL LOW (ref 8.9–10.3)
Chloride: 104 mmol/L (ref 101–111)
Creatinine, Ser: 0.97 mg/dL (ref 0.44–1.00)
GFR calc Af Amer: >60 mL/min (ref >60)
GFR calc non Af Amer: >60 mL/min (ref >60)
Glucose, Bld: 138 mg/dL — ABNORMAL HIGH (ref 65–99)
Potassium: 3.5 mmol/L (ref 3.5–5.1)
Sodium: 139 mmol/L (ref 135–145)

## 2016-03-07 MED ORDER — PREDNISONE 50 MG PO TABS: 50.0000 mg | ORAL_TABLET | Freq: Every day | ORAL | Status: DC

## 2016-03-07 MED ORDER — ALBUTEROL SULFATE (2.5 MG/3ML) 0.083% IN NEBU
5.0000 mg | INHALATION_SOLUTION | Freq: Once | RESPIRATORY_TRACT | Status: AC
  Administered 2016-03-07: 5 mg via RESPIRATORY_TRACT
  Filled 2016-03-07: qty 6

## 2016-03-07 NOTE — ED Notes (Signed)
Pt. arrived with EMS from home reports wheezing onset this morning unrelieved by home nebulizer treatments , received Duoneb 10 mg/0.5 mg , Solumedrol 125 mg IV and Magnesium 2 grams IV by EMS prior to arrival .

## 2016-03-07 NOTE — ED Provider Notes (Signed)
CSN: [id number]     Arrival date & time 03/07/16  0451 History   First MD Initiated Contact with Patient 03/07/16 0455     Chief Complaint  Patient presents with  . Asthma     (Consider location/radiation/quality/duration/timing/severity/associated sxs/prior Treatment) Patient is a 25 y.o. female presenting with asthma. The history is provided by the patient.  Asthma  She started having difficulty breathing about 2 hours ago. There is associated dull midsternal chest pain. She denies fever, chills, sweats. She denies cough. She states that she had no respiratory difficulties whatsoever yesterday. At home, she used her albuterol inhaler and nebulizer without relief. She came in by ambulance where she received albuterol with ipratropium, methylprednisolone, and manganese see him.  Past Medical History  Diagnosis Date  . Asthma   . Pulmonary hemorrhage of fetus or newborn     29 week C-section [facility] of with birth hypoxia  . Hypoxia of newborn   . Hearing loss in right ear     with hearing aid bilateral   . Hemiparesis ([place])     rt from neonatal period  . Allergy   . ADHD (attention deficit hyperactivity disorder)   . Irregular periods 06/04/2011  . BMI (body mass index), pediatric, 95-99% for age 67/10/2010    Is starting to gain weight again and advised and counseled today about reduction for health risk reasons. Patient is very well aware of how to do lifestyle intervention she has done this before.   Marland Kitchen PCOS (polycystic ovarian syndrome)    Past Surgical History  Procedure Laterality Date  . Tonsillectomy     Family History  Problem Relation Age of Onset  . Adopted: Yes  . Hypertension    . Schizophrenia Mother     with alcohol drug abuse  . Diabetes Other    Social History  Substance Use Topics  . Smoking status: Never Smoker   . Smokeless tobacco: None  . Alcohol Use: No   OB History    No data available     Review of Systems  All other systems reviewed and are  negative.     Allergies  Review of patient's allergies indicates no known allergies.  Home Medications   Prior to Admission medications   Medication Sig Start Date End Date Taking? Authorizing Provider  albuterol (PROVENTIL HFA;VENTOLIN HFA) 108 (90 BASE) MCG/ACT inhaler Inhale 2 puffs into the lungs every 6 (six) hours as needed for wheezing or shortness of breath. 09/09/13   Burnis Medin, MD  albuterol (PROVENTIL) (2.5 MG/3ML) 0.083% nebulizer solution Take 3 mLs (2.5 mg total) by nebulization every 4 (four) hours as needed for wheezing or shortness of breath. 03/12/14   Tatyana Kirichenko, PA-C  fluticasone-salmeterol (ADVAIR HFA) 230-21 MCG/ACT inhaler Inhale 2 puffs into the lungs 2 (two) times daily.    Historical Provider, MD  HYDROcodone-acetaminophen (NORCO/VICODIN) 5-325 MG tablet Take 2 tablets by mouth every 6 (six) hours as needed. 10/22/15   [place] Lions, PA-C  ipratropium (ATROVENT) 0.02 % nebulizer solution Take 0.5 mg by nebulization every 4 (four) hours as needed for wheezing or shortness of breath.    Historical Provider, MD  ketoconazole (NIZORAL) 2 % cream Apply 1 application topically 2 (two) times daily. For axillary rash 06/12/15   Burnis Medin, MD  Mepolizumab (NUCALA [place]) Inject 1 Syringe into the skin every 28 (twenty-eight) days.    Historical Provider, MD  metFORMIN (GLUCOPHAGE) 500 MG tablet Take 2 tablets (1,000 mg total) by  mouth 2 (two) times daily with a meal. 08/08/15   Philemon Kingdom, MD  methylphenidate (CONCERTA) 36 MG PO CR tablet Take 1 tablet (36 mg total) by mouth daily. 11/07/15   Burnis Medin, MD  methylphenidate (CONCERTA) 36 MG PO CR tablet Take 2 tablets by mouth daily 11/07/15   Burnis Medin, MD  methylphenidate 36 MG PO CR tablet Take 2 tablets (72 mg total) by mouth daily. 11/07/15   Burnis Medin, MD  methylphenidate 36 MG PO CR tablet Take 2 tablets by mouth daily. 11/07/15   Burnis Medin, MD  methylphenidate 36 MG PO CR tablet  Take 2 tablets by mouth daily 11/07/15   Burnis Medin, MD  methylphenidate 36 MG PO CR tablet Take 1 tablet (36 mg total) by mouth daily. 11/27/15   Burnis Medin, MD  montelukast (SINGULAIR) 10 MG tablet Take 1 tablet (10 mg total) by mouth at bedtime. 01/02/15   Burnis Medin, MD  Multiple Vitamin (MULTIVITAMIN) tablet Take 1 tablet by mouth daily.    Historical Provider, MD  norethindrone-ethinyl estradiol (JUNEL FE,GILDESS FE,LOESTRIN FE) 1-20 MG-MCG tablet Take 1 tablet by mouth daily. 07/21/14   Burnis Medin, MD  norgestimate-ethinyl estradiol (ORTHO-CYCLEN, 28,) 0.25-35 MG-MCG tablet Take 1 tablet by mouth daily. 11/07/15   Burnis Medin, MD  omeprazole (PRILOSEC) 20 MG capsule Take 1 capsule (20 mg total) by mouth 2 (two) times daily before a meal. 06/20/14   Burnis Medin, MD  tiotropium (SPIRIVA) 18 MCG inhalation capsule Place 18 mcg into inhaler and inhale daily.    Historical Provider, MD   There were no vitals taken for this visit. Physical Exam  Nursing note and vitals reviewed.  25 year old female, resting comfortably and in no acute distress. Vital signs are normal. Oxygen saturation is 100%, which is normal. Head is normocephalic and atraumatic. PERRLA, EOMI. Oropharynx is clear. Neck is nontender and supple without adenopathy or JVD. Back is nontender and there is no CVA tenderness. Lungs have diffuse expiratory wheezes, but wheezes seem to be more from the upper airway than actual bronchospasm. There is decreased air flow. Chest is nontender. Heart has regular rate and rhythm without murmur. Abdomen is soft, flat, nontender without masses or hepatosplenomegaly and peristalsis is normoactive. Extremities have no cyanosis or edema, full range of motion is present. Skin is warm and dry without rash. Neurologic: Mental status is normal, cranial nerves are intact, there are no motor or sensory deficits.  ED Course  Procedures (including critical care time) Labs  Review Results for orders placed or performed during the hospital encounter of [id number]  Basic metabolic panel  Result Value Ref Range   Sodium 139 135 - 145 mmol/L   Potassium 3.5 3.5 - 5.1 mmol/L   Chloride 104 101 - 111 mmol/L   CO2 22 22 - 32 mmol/L   Glucose, Bld 138 (H) 65 - 99 mg/dL   BUN 11 6 - 20 mg/dL   Creatinine, Ser 0.97 0.44 - 1.00 mg/dL   Calcium 8.8 (L) 8.9 - 10.3 mg/dL   GFR calc non Af Amer >60 >60 mL/min   GFR calc Af Amer >60 >60 mL/min   Anion gap 13 5 - 15    Imaging Review Dg Chest 2 View  03/07/2016  CLINICAL DATA:  Shortness of breath today. Nonsmoker. History of chronic lung disease and asthma. EXAM: CHEST  2 VIEW COMPARISON:  07/11/2015 FINDINGS: The heart size and mediastinal contours  are within normal limits. Both lungs are clear. The visualized skeletal structures are unremarkable. IMPRESSION: No active cardiopulmonary disease. Electronically Signed   By: Lucienne Capers M.D.   On: 03/07/2016 06:07   I have personally reviewed and evaluated these images and lab results as part of my medical decision-making.   EKG Interpretation ROBERTTA, FINNELL [id number] 07-Mar-2016 04:59:18 [facility]/ED ROUTINE RECORD ** PID / NAME MISMATCH ** Sinus rhythm Borderline short PR interval Borderline T abnormalities, diffuse leads 54mm/s 53mm/mV 100Hz  8.0 SP2 CID: [id number] Referred by: Unconfirmed Vent. rate 86 BPM PR interval 117 ms QRS duration 88 ms QT/QTc 363/434 ms P-R-T axes 53 68 -43 Feb 11, 1991 (24 yr) Female Black Room:D36C Loc:11 Technician: [phone]  When compared with ECG of 05/31/2015, no significant changes are seen.    MDM   Final diagnoses:  Asthma exacerbation    Asthma exacerbation. Old records are reviewed and she does have a known history of asthma, but has had very few ED visits because of that. I do not see any hospitalizations from her asthma. She'll be given additional nebulizer treatments in the ED.  Reexam after  albuterol with ipratropium in the ED shows lungs are completely clear. She is resting comfortably and not using accessory muscles of respiration. She is discharged with prescription for prednisone, follow-up with [place] [id number]

## 2016-03-07 NOTE — Discharge Instructions (Signed)
Asthma, Adult Asthma is a recurring condition in which the airways tighten and narrow. Asthma can make it difficult to breathe. It can cause coughing, wheezing, and shortness of breath. Asthma episodes, also called asthma attacks, range from minor to life-threatening. Asthma cannot be cured, but medicines and lifestyle changes can help control it. CAUSES Asthma is believed to be caused by inherited (genetic) and environmental factors, but its exact cause is unknown. Asthma may be triggered by allergens, lung infections, or irritants in the air. Asthma triggers are different for each person. Common triggers include:   Animal dander.  Dust mites.  Cockroaches.  Pollen from trees or grass.  Mold.  Smoke.  Air pollutants such as dust, household cleaners, hair sprays, aerosol sprays, paint fumes, strong chemicals, or strong odors.  Cold air, weather changes, and winds (which increase molds and pollens in the air).  Strong emotional expressions such as crying or laughing hard.  Stress.  Certain medicines (such as aspirin) or types of drugs (such as beta-blockers).  Sulfites in foods and drinks. Foods and drinks that may contain sulfites include dried fruit, potato chips, and sparkling grape juice.  Infections or inflammatory conditions such as the flu, a cold, or an inflammation of the nasal membranes (rhinitis).  Gastroesophageal reflux disease (GERD).  Exercise or strenuous activity. SYMPTOMS Symptoms may occur immediately after asthma is triggered or many hours later. Symptoms include:  Wheezing.  Excessive nighttime or early morning coughing.  Frequent or severe coughing with a common cold.  Chest tightness.  Shortness of breath. DIAGNOSIS  The diagnosis of asthma is made by a review of your medical history and a physical exam. Tests may also be performed. These may include:  Lung function studies. These tests show how much air you breathe in and out.  Allergy  tests.  Imaging tests such as X-rays. TREATMENT  Asthma cannot be cured, but it can usually be controlled. Treatment involves identifying and avoiding your asthma triggers. It also involves medicines. There are 2 classes of medicine used for asthma treatment:   Controller medicines. These prevent asthma symptoms from occurring. They are usually taken every day.  Reliever or rescue medicines. These quickly relieve asthma symptoms. They are used as needed and provide short-term relief. Your health care provider will help you create an asthma action plan. An asthma action plan is a written plan for managing and treating your asthma attacks. It includes a list of your asthma triggers and how they may be avoided. It also includes information on when medicines should be taken and when their dosage should be changed. An action plan may also involve the use of a device called a peak flow meter. A peak flow meter measures how well the lungs are working. It helps you monitor your condition. HOME CARE INSTRUCTIONS   Take medicines only as directed by your health care provider. Speak with your health care provider if you have questions about how or when to take the medicines.  Use a peak flow meter as directed by your health care provider. Record and keep track of readings.  Understand and use the action plan to help minimize or stop an asthma attack without needing to seek medical care.  Control your home environment in the following ways to help prevent asthma attacks:  Do not smoke. Avoid being exposed to secondhand smoke.  Change your heating and air conditioning filter regularly.  Limit your use of fireplaces and wood stoves.  Get rid of pests (such as roaches  and mice) and their droppings.  Throw away plants if you see mold on them.  Clean your floors and dust regularly. Use unscented cleaning products.  Try to have someone else vacuum for you regularly. Stay out of rooms while they are  being vacuumed and for a short while afterward. If you vacuum, use a dust mask from a hardware store, a double-layered or microfilter vacuum cleaner bag, or a vacuum cleaner with a HEPA filter.  Replace carpet with wood, tile, or vinyl flooring. Carpet can trap dander and dust.  Use allergy-proof pillows, mattress covers, and box spring covers.  Wash bed sheets and blankets every week in hot water and dry them in a dryer.  Use blankets that are made of polyester or cotton.  Clean bathrooms and kitchens with bleach. If possible, have someone repaint the walls in these rooms with mold-resistant paint. Keep out of the rooms that are being cleaned and painted.  Wash hands frequently. SEEK MEDICAL CARE IF:   You have wheezing, shortness of breath, or a cough even if taking medicine to prevent attacks.  The colored mucus you cough up (sputum) is thicker than usual.  Your sputum changes from clear or white to yellow, green, gray, or bloody.  You have any problems that may be related to the medicines you are taking (such as a rash, itching, swelling, or trouble breathing).  You are using a reliever medicine more than 2-3 times per week.  Your peak flow is still at 50-79% of your personal best after following your action plan for 1 hour.  You have a fever. SEEK IMMEDIATE MEDICAL CARE IF:   You seem to be getting worse and are unresponsive to treatment during an asthma attack.  You are short of breath even at rest.  You get short of breath when doing very little physical activity.  You have difficulty eating, drinking, or talking due to asthma symptoms.  You develop chest pain.  You develop a fast heartbeat.  You have a bluish color to your lips or fingernails.  You are light-headed, dizzy, or faint.  Your peak flow is less than 50% of your personal best.   This information is not intended to replace advice given to you by your health care provider. Make sure you discuss any  questions you have with your health care provider.   Document Released: 10/06/2005 Document Revised: 06/27/2015 Document Reviewed: 05/05/2013 Elsevier Interactive Patient Education 2016 Elsevier Inc.  Prednisone tablets What is this medicine? PREDNISONE (PRED ni sone) is a corticosteroid. It is commonly used to treat inflammation of the skin, joints, lungs, and other organs. Common conditions treated include asthma, allergies, and arthritis. It is also used for other conditions, such as blood disorders and diseases of the adrenal glands. This medicine may be used for other purposes; ask your health care provider or pharmacist if you have questions. What should I tell my health care provider before I take this medicine? They need to know if you have any of these conditions: -Cushing's syndrome -diabetes -glaucoma -heart disease -high blood pressure -infection (especially a virus infection such as chickenpox, cold sores, or herpes) -kidney disease -liver disease -mental illness -myasthenia gravis -osteoporosis -seizures -stomach or intestine problems -thyroid disease -an unusual or allergic reaction to lactose, prednisone, other medicines, foods, dyes, or preservatives -pregnant or trying to get pregnant -breast-feeding How should I use this medicine? Take this medicine by mouth with a glass of water. Follow the directions on the prescription label. Take  this medicine with food. If you are taking this medicine once a day, take it in the morning. Do not take more medicine than you are told to take. Do not suddenly stop taking your medicine because you may develop a severe reaction. Your doctor will tell you how much medicine to take. If your doctor wants you to stop the medicine, the dose may be slowly lowered over time to avoid any side effects. Talk to your pediatrician regarding the use of this medicine in children. Special care may be needed. Overdosage: If you think you have taken  too much of this medicine contact a poison control center or emergency room at once. NOTE: This medicine is only for you. Do not share this medicine with others. What if I miss a dose? If you miss a dose, take it as soon as you can. If it is almost time for your next dose, talk to your doctor or health care professional. You may need to miss a dose or take an extra dose. Do not take double or extra doses without advice. What may interact with this medicine? Do not take this medicine with any of the following medications: -metyrapone -mifepristone This medicine may also interact with the following medications: -aminoglutethimide -amphotericin B -aspirin and aspirin-like medicines -barbiturates -certain medicines for diabetes, like glipizide or glyburide -cholestyramine -cholinesterase inhibitors -cyclosporine -digoxin -diuretics -ephedrine -female hormones, like estrogens and birth control pills -isoniazid -ketoconazole -NSAIDS, medicines for pain and inflammation, like ibuprofen or naproxen -phenytoin -rifampin -toxoids -vaccines -warfarin This list may not describe all possible interactions. Give your health care provider a list of all the medicines, herbs, non-prescription drugs, or dietary supplements you use. Also tell them if you smoke, drink alcohol, or use illegal drugs. Some items may interact with your medicine. What should I watch for while using this medicine? Visit your doctor or health care professional for regular checks on your progress. If you are taking this medicine over a prolonged period, carry an identification card with your name and address, the type and dose of your medicine, and your doctor's name and address. This medicine may increase your risk of getting an infection. Tell your doctor or health care professional if you are around anyone with measles or chickenpox, or if you develop sores or blisters that do not heal properly. If you are going to have  surgery, tell your doctor or health care professional that you have taken this medicine within the last twelve months. Ask your doctor or health care professional about your diet. You may need to lower the amount of salt you eat. This medicine may affect blood sugar levels. If you have diabetes, check with your doctor or health care professional before you change your diet or the dose of your diabetic medicine. What side effects may I notice from receiving this medicine? Side effects that you should report to your doctor or health care professional as soon as possible: -allergic reactions like skin rash, itching or hives, swelling of the face, lips, or tongue -changes in emotions or moods -changes in vision -depressed mood -eye pain -fever or chills, cough, sore throat, pain or difficulty passing urine -increased thirst -swelling of ankles, feet Side effects that usually do not require medical attention (report to your doctor or health care professional if they continue or are bothersome): -confusion, excitement, restlessness -headache -nausea, vomiting -skin problems, acne, thin and shiny skin -trouble sleeping -weight gain This list may not describe all possible side effects. Call your doctor  for medical advice about side effects. You may report side effects to FDA at 1-800-FDA-1088. Where should I keep my medicine? Keep out of the reach of children. Store at room temperature between 15 and 30 degrees C (59 and 86 degrees F). Protect from light. Keep container tightly closed. Throw away any unused medicine after the expiration date. NOTE: This sheet is a summary. It may not cover all possible information. If you have questions about this medicine, talk to your doctor, pharmacist, or health care provider.    2016, Elsevier/Gold Standard. (2011-05-22 10:57:14)

## 2016-03-14 ENCOUNTER — Encounter: Payer: Self-pay | Admitting: Internal Medicine

## 2016-03-14 ENCOUNTER — Other Ambulatory Visit: Payer: Self-pay | Admitting: Internal Medicine

## 2016-03-14 ENCOUNTER — Ambulatory Visit: Payer: Federal, State, Local not specified - PPO | Admitting: Internal Medicine

## 2016-03-14 ENCOUNTER — Ambulatory Visit (INDEPENDENT_AMBULATORY_CARE_PROVIDER_SITE_OTHER): Payer: Federal, State, Local not specified - PPO | Admitting: Internal Medicine

## 2016-03-14 VITALS — BP 140/82 | HR 92 | Temp 98.4°F | Ht 67.0 in | Wt 286.6 lb

## 2016-03-14 DIAGNOSIS — IMO0001 Reserved for inherently not codable concepts without codable children: Secondary | ICD-10-CM

## 2016-03-14 DIAGNOSIS — Z79899 Other long term (current) drug therapy: Secondary | ICD-10-CM

## 2016-03-14 DIAGNOSIS — J45901 Unspecified asthma with (acute) exacerbation: Secondary | ICD-10-CM | POA: Diagnosis not present

## 2016-03-14 DIAGNOSIS — R635 Abnormal weight gain: Secondary | ICD-10-CM | POA: Diagnosis not present

## 2016-03-14 DIAGNOSIS — J4520 Mild intermittent asthma, uncomplicated: Secondary | ICD-10-CM | POA: Diagnosis not present

## 2016-03-14 MED ORDER — PREDNISONE 20 MG PO TABS: ORAL_TABLET | ORAL | Status: DC

## 2016-03-14 MED ORDER — ALBUTEROL SULFATE (2.5 MG/3ML) 0.083% IN NEBU: 2.5000 mg | INHALATION_SOLUTION | RESPIRATORY_TRACT | Status: DC | PRN

## 2016-03-14 NOTE — Progress Notes (Signed)
Chief Complaint  Patient presents with  . Follow-up    HPI: Glenda Mendez 25 y.o.   Fu ed visit with mom for cp and asthmatic sx  Had been to the ED about a week ago came on suddenly came in by EMS got IV meds including magnesium steroids nebulizer did well and went home. Had improved until yesterday Felt tight chest yesterday and then had 60 mg  Yesterday and today . This was leftover prednisone mom had. She is using her nebulizer but is running out of ipratropium. She didn't sleep well last night because it was late when she lays down she felt chest tightness. Somewhat better this morning. Uncertainly what the triggers are although her previous meds for controlling her ED visits. However the medication Nucala helped her ;was not approved by her insurance. According to mom [facility] specialist team was not sending enough information to the insurance company. Her pulmonologist to do is going to be leaving the system. She will need to be assigned to another pulmonologist. Would like to have a local pulmonologist to help with her difficulties. She has gained weight over the last 6 months is now starting to pay attention to her weight and does want to change. ROS: See pertinent positives and negatives per HPI.  Past Medical History  Diagnosis Date  . Asthma   . Pulmonary hemorrhage of fetus or newborn     51 week C-section [facility] of with birth hypoxia  . Hypoxia of newborn   . Hearing loss in right ear     with hearing aid bilateral   . Hemiparesis ([place])     rt from neonatal period  . Allergy   . ADHD (attention deficit hyperactivity disorder)   . Irregular periods 06/04/2011  . BMI (body mass index), pediatric, 95-99% for age 55/10/2010    Is starting to gain weight again and advised and counseled today about reduction for health risk reasons. Patient is very well aware of how to do lifestyle intervention she has done this before.   Marland Kitchen PCOS (polycystic ovarian syndrome)     Family History    Problem Relation Age of Onset  . Adopted: Yes  . Hypertension    . Schizophrenia Mother     with alcohol drug abuse  . Diabetes Other     Social History   Social History  . Marital Status: Single    Spouse Name: N/A  . Number of Children: N/A  . Years of Education: N/A   Social History Main Topics  . Smoking status: Never Smoker   . Smokeless tobacco: None  . Alcohol Use: No  . Drug Use: No  . Sexual Activity: Yes    Birth Control/ Protection: Pill   Other Topics Concern  . None   Social History Narrative   Adopted and raised by family member Glenda Mendez   [employer] high school  graduate [employer] second semester   Living at home with mom and has no car   Sleep 8+ hours   Seeing Dr. Lyman Mendez- [facility]m at [facility] now has a new pulmonary Dr. Now stable and on prn       Thyroid evaluation in the past neck evaluation within normal limits and no thyroid disease.    Outpatient Prescriptions Prior to Visit  Medication Sig Dispense Refill  . albuterol (PROVENTIL HFA;VENTOLIN HFA) 108 (90 BASE) MCG/ACT inhaler Inhale 2 puffs into the lungs every 6 (six) hours as needed for wheezing or shortness of breath.  1 Inhaler 4  . fluticasone-salmeterol (ADVAIR HFA) 230-21 MCG/ACT inhaler Inhale 2 puffs into the lungs 2 (two) times daily.    . metFORMIN (GLUCOPHAGE) 500 MG tablet Take 2 tablets (1,000 mg total) by mouth 2 (two) times daily with a meal. 120 tablet 5  . methylphenidate 36 MG PO CR tablet Take 2 tablets (72 mg total) by mouth daily. 60 tablet 0  . norgestimate-ethinyl estradiol (ORTHO-CYCLEN, 28,) 0.25-35 MG-MCG tablet Take 1 tablet by mouth daily. 1 Package 5  . omeprazole (PRILOSEC) 20 MG capsule Take 1 capsule (20 mg total) by mouth 2 (two) times daily before a meal. 60 capsule 3  . predniSONE (DELTASONE) 50 MG tablet Take 1 tablet (50 mg total) by mouth daily. 3 tablet 0  . tiotropium (SPIRIVA) 18 MCG inhalation capsule Place 18 mcg into inhaler and inhale daily.    Marland Kitchen  albuterol (PROVENTIL) (2.5 MG/3ML) 0.083% nebulizer solution Take 3 mLs (2.5 mg total) by nebulization every 4 (four) hours as needed for wheezing or shortness of breath. 75 mL 2   No facility-administered medications prior to visit.     EXAM:  BP 140/82 mmHg  Pulse 92  Temp(Src) 98.4 F (36.9 C) (Oral)  Ht 5\' 7"  (1.702 m)  Wt 286 lb 9.6 oz (130.001 kg)  BMI 44.88 kg/m2  SpO2 98%  LMP 03/06/2016  Body mass index is 44.88 kg/(m^2).  GENERAL: vitals reviewed and listed above, alert, oriented, appears well hydrated and in no acute distress looks sleepy no sob cough  HEENT: atraumatic, conjunctiva  clear, no obvious abnormalities on inspection of external nose and ears   NECK: no obviousincrease  masses on inspection palpation  LUNGS: clear to auscultation bilaterally, no wheezes, rales or rhonchi, ?air movement large chest wall  CV: HRRR, no clubbing cyanosis  nl cap refill  Abdomen:  Sof,t normal bowel sounds without hepatosplenomegaly, no guarding rebound or masses no CVA tenderness MS: moves all extremities without noticeable focal  abnormality PSYCH: pleasant and cooperative,  BP Readings from Last 3 Encounters:  03/14/16 140/82  03/07/16 124/61  11/07/15 130/70   Wt Readings from Last 3 Encounters:  03/14/16 286 lb 9.6 oz (130.001 kg)  11/07/15 277 lb 8 oz (125.873 kg)  10/26/15 274 lb (124.286 kg)    ASSESSMENT AND PLAN:  Discussed the following assessment and plan:  Moderate intermittent asthma, uncomplicated - Plan: Ambulatory referral to Pulmonology  Asthma with acute exacerbation, unspecified asthma severity - Recent increased frequency of attacks in ED visit on maximum suppressive therapy. Referred to local pulmonologist finish another prednisone course - Plan: Ambulatory referral to Pulmonology  Medication management  Weight gain  Morbid obesity, unspecified obesity type ([place]) We'll do referral 5 day prednisone again maximum therapy good deal of time spent  about weight control contribution to asthma is a correctable modifiable factor. Discussed Weight Watchers they both can go this a lifestyle change and not a diet for a month cut out all calorie beverages except for milk discussed other options. Declined nutrition referral at this point because she has been before. At risk for metabolic dysfunction. considier underlying mood issues  -Patient advised to return or notify health care team  if symptoms worsen ,persist or new concerns arise.  Patient Instructions  Work on   Eating healthy and weight control  Weight loss will help your asthma.  Will do a pulmonary referral .   Try  to get local    Help.  Prednisone course  Over the next  4   days .         Standley Brooking. Panosh M.D.

## 2016-03-14 NOTE — Progress Notes (Signed)
Pre visit review using our clinic tool,if applicable. No additional management support is needed unless otherwise documented below in the visit note.  

## 2016-03-14 NOTE — Patient Instructions (Addendum)
Work on   Eating healthy and weight control  Weight loss will help your asthma.  Will do a pulmonary referral .   Try  to get local    Help.  Prednisone course  Over the next 4   days .

## 2016-03-19 NOTE — Telephone Encounter (Signed)
Ok to refill x 3 until she gets a new pulmonary doctor

## 2016-03-27 ENCOUNTER — Encounter (HOSPITAL_COMMUNITY): Payer: Self-pay | Admitting: Emergency Medicine

## 2016-03-27 ENCOUNTER — Emergency Department (HOSPITAL_COMMUNITY)
Admission: EM | Admit: 2016-03-27 | Discharge: 2016-03-27 | Disposition: A | Discharge status: Home / Self Care | Payer: Federal, State, Local not specified - PPO | Attending: Emergency Medicine | Admitting: Emergency Medicine

## 2016-03-27 DIAGNOSIS — J45901 Unspecified asthma with (acute) exacerbation: Secondary | ICD-10-CM | POA: Diagnosis not present

## 2016-03-27 DIAGNOSIS — R069 Unspecified abnormalities of breathing: Secondary | ICD-10-CM | POA: Diagnosis not present

## 2016-03-27 DIAGNOSIS — Z7984 Long term (current) use of oral hypoglycemic drugs: Secondary | ICD-10-CM | POA: Insufficient documentation

## 2016-03-27 DIAGNOSIS — Z79899 Other long term (current) drug therapy: Secondary | ICD-10-CM | POA: Insufficient documentation

## 2016-03-27 MED ORDER — ALBUTEROL SULFATE (2.5 MG/3ML) 0.083% IN NEBU
5.0000 mg | INHALATION_SOLUTION | Freq: Once | RESPIRATORY_TRACT | Status: AC
  Administered 2016-03-27: 5 mg via RESPIRATORY_TRACT
  Filled 2016-03-27: qty 6

## 2016-03-27 MED ORDER — PREDNISONE 20 MG PO TABS
20.0000 mg | ORAL_TABLET | Freq: Once | ORAL | Status: AC
  Administered 2016-03-27: 20 mg via ORAL
  Filled 2016-03-27: qty 1

## 2016-03-27 MED ORDER — IPRATROPIUM BROMIDE 0.02 % IN SOLN
0.5000 mg | Freq: Once | RESPIRATORY_TRACT | Status: AC
Start: 2016-03-27 — End: 2016-03-27
  Administered 2016-03-27: 0.5 mg via RESPIRATORY_TRACT
  Filled 2016-03-27: qty 2.5

## 2016-03-27 MED ORDER — ACETAMINOPHEN 500 MG PO TABS
1000.0000 mg | ORAL_TABLET | Freq: Once | ORAL | Status: AC
  Administered 2016-03-27: 1000 mg via ORAL
  Filled 2016-03-27: qty 2

## 2016-03-27 MED ORDER — PREDNISONE 20 MG PO TABS: 40.0000 mg | ORAL_TABLET | Freq: Every day | ORAL | Status: DC

## 2016-03-27 NOTE — ED Notes (Signed)
PER [facility]: Patient to ED from home c/o asthma attack. Per EMS, pt has her own treatments at home but hasn't been able to fill her Atrovent yet so she's only had her albuterol the fast few days. Pt reports this episode started around midnight and her 2.5mg  albuterol tx didn't work. Upon EMS arrival, pt did have some wheezing upper lobes - was given duoneb and 5mg  albuterol and has residual wheezing RUL. Patient reports 9/10 pain across rib cage. 99% RA. Per EMS, patient and her mother were requesting morphine after having nebulizer treatments because it causes her pain. Patient A&O x 4, respirations e/u at this time. NAD noted.

## 2016-03-27 NOTE — Discharge Instructions (Signed)

## 2016-03-27 NOTE — ED Provider Notes (Signed)
CSN: [id number]     Arrival date & time 03/27/16  0533 History   First MD Initiated Contact with Patient 03/27/16 [phone]     Chief Complaint  Patient presents with  . Asthma     (Consider location/radiation/quality/duration/timing/severity/associated sxs/prior Treatment) HPI Comments: Patient with PMH of asthma presents to the ED with a chief complaint of asthma exacerbation.  She states that the symptoms started last night around 11 pm.  She states that she has had several asthma exacerbations in the past several weeks.  She attributes her exacerbations to changes in weather.  She has tried taking a breathing treatment and a dose of left over prednisone (40mg ) at home. She is scheduled to follow-up with a pulmonologist at [facility] on Monday. She denies any fevers or productive cough.    The history is provided by the patient. No language interpreter was used.    Past Medical History  Diagnosis Date  . Asthma   . Pulmonary hemorrhage of fetus or newborn     87 week C-section [facility] of with birth hypoxia  . Hypoxia of newborn   . Hearing loss in right ear     with hearing aid bilateral   . Hemiparesis ([place])     rt from neonatal period  . Allergy   . ADHD (attention deficit hyperactivity disorder)   . Irregular periods 06/04/2011  . BMI (body mass index), pediatric, 95-99% for age 56/10/2010    Is starting to gain weight again and advised and counseled today about reduction for health risk reasons. Patient is very well aware of how to do lifestyle intervention she has done this before.   Marland Kitchen PCOS (polycystic ovarian syndrome)    Past Surgical History  Procedure Laterality Date  . Tonsillectomy     Family History  Problem Relation Age of Onset  . Adopted: Yes  . Hypertension    . Schizophrenia Mother     with alcohol drug abuse  . Diabetes Other    Social History  Substance Use Topics  . Smoking status: Never Smoker   . Smokeless tobacco: None  . Alcohol Use: No   OB History    No  data available     Review of Systems  Constitutional: Negative for fever and chills.  Respiratory: Positive for wheezing. Negative for shortness of breath.   Cardiovascular: Negative for chest pain.  Gastrointestinal: Negative for nausea, vomiting, diarrhea and constipation.  Genitourinary: Negative for dysuria.  All other systems reviewed and are negative.     Allergies  Review of patient's allergies indicates no known allergies.  Home Medications   Prior to Admission medications   Medication Sig Start Date End Date Taking? Authorizing Provider  albuterol (PROVENTIL HFA;VENTOLIN HFA) 108 (90 BASE) MCG/ACT inhaler Inhale 2 puffs into the lungs every 6 (six) hours as needed for wheezing or shortness of breath. 09/09/13   Burnis Medin, MD  albuterol (PROVENTIL) (2.5 MG/3ML) 0.083% nebulizer solution Take 3 mLs (2.5 mg total) by nebulization every 4 (four) hours as needed for wheezing or shortness of breath. 03/14/16   Burnis Medin, MD  fluticasone-salmeterol (ADVAIR HFA) 230-21 MCG/ACT inhaler Inhale 2 puffs into the lungs 2 (two) times daily.    Historical Provider, MD  ipratropium (ATROVENT) 0.02 % nebulizer solution USE 1 VIAL BY NEBULIZATION 3 (THREE) TIMES DAILY AS NEEDED FOR WHEEZING. 03/20/16   Burnis Medin, MD  metFORMIN (GLUCOPHAGE) 500 MG tablet Take 2 tablets (1,000 mg total) by mouth 2 (two) times  daily with a meal. 08/08/15   Philemon Kingdom, MD  methylphenidate 36 MG PO CR tablet Take 2 tablets (72 mg total) by mouth daily. 11/07/15   Burnis Medin, MD  norgestimate-ethinyl estradiol (ORTHO-CYCLEN, 28,) 0.25-35 MG-MCG tablet Take 1 tablet by mouth daily. 11/07/15   Burnis Medin, MD  omeprazole (PRILOSEC) 20 MG capsule Take 1 capsule (20 mg total) by mouth 2 (two) times daily before a meal. 06/20/14   Burnis Medin, MD  predniSONE (DELTASONE) 20 MG tablet Take 3 po qd for 2 days then 2 po qd for 3 days,or as directed 03/14/16   Burnis Medin, MD  predniSONE (DELTASONE) 50  MG tablet Take 1 tablet (50 mg total) by mouth daily. 0000000   Delora Fuel, MD  tiotropium (SPIRIVA) 18 MCG inhalation capsule Place 18 mcg into inhaler and inhale daily.    Historical Provider, MD   BP 145/81 mmHg  Pulse 94  Temp(Src) 98.4 F (36.9 C) (Oral)  Resp 17  Ht 5\' 7"  (1.702 m)  Wt 130.182 kg  BMI 44.94 kg/m2  SpO2 100%  LMP 03/06/2016 Physical Exam  Constitutional: She is oriented to person, place, and time. She appears well-developed and well-nourished.  HENT:  Head: Normocephalic and atraumatic.  Eyes: Conjunctivae and EOM are normal. Pupils are equal, round, and reactive to light.  Neck: Normal range of motion. Neck supple.  Cardiovascular: Normal rate and regular rhythm.  Exam reveals no gallop and no friction rub.   No murmur heard. Pulmonary/Chest: Effort normal. No respiratory distress. She has wheezes. She has no rales. She exhibits no tenderness.  Diminished lung sounds with a few small scattered wheezes  Abdominal: Soft. Bowel sounds are normal. She exhibits no distension and no mass. There is no tenderness. There is no rebound and no guarding.  Musculoskeletal: Normal range of motion. She exhibits no edema or tenderness.  Neurological: She is alert and oriented to person, place, and time.  Skin: Skin is warm and dry.  Psychiatric: She has a normal mood and affect. Her behavior is normal. Judgment and thought content normal.  Nursing note and vitals reviewed.   ED Course  Procedures (including critical care time)   MDM   Final diagnoses:  Asthma exacerbation    Patient with hx of asthma here with asthma exacerbation.  Will treat with duoneb, give 20 mg additional prednisone, (she took 40 mg leftover prednisone at home).  VSS.  Afebrile.  No productive cough, doubt pneumonia.  6:50 AM Patient reassessed after breathing treatment.  Feeling better.  Will continue prednisone for a few more days.  Patient has follow-up on Monday with pulmonology at  [facility].   Montine Circle, PA-C 03/27/16 [place], DO 03/27/16 [id number]

## 2016-03-31 DIAGNOSIS — J383 Other diseases of vocal cords: Secondary | ICD-10-CM | POA: Diagnosis not present

## 2016-03-31 DIAGNOSIS — J4540 Moderate persistent asthma, uncomplicated: Secondary | ICD-10-CM | POA: Diagnosis not present

## 2016-03-31 DIAGNOSIS — J301 Allergic rhinitis due to pollen: Secondary | ICD-10-CM | POA: Diagnosis not present

## 2016-05-08 ENCOUNTER — Ambulatory Visit (INDEPENDENT_AMBULATORY_CARE_PROVIDER_SITE_OTHER): Payer: Federal, State, Local not specified - PPO | Admitting: Internal Medicine

## 2016-05-08 ENCOUNTER — Encounter: Payer: Self-pay | Admitting: Internal Medicine

## 2016-05-08 VITALS — BP 128/82 | HR 105 | Ht 67.5 in | Wt 290.0 lb

## 2016-05-08 DIAGNOSIS — J45909 Unspecified asthma, uncomplicated: Secondary | ICD-10-CM

## 2016-05-08 DIAGNOSIS — R062 Wheezing: Secondary | ICD-10-CM | POA: Diagnosis not present

## 2016-05-08 DIAGNOSIS — R0689 Other abnormalities of breathing: Secondary | ICD-10-CM

## 2016-05-08 DIAGNOSIS — R0600 Dyspnea, unspecified: Secondary | ICD-10-CM | POA: Diagnosis not present

## 2016-05-08 LAB — NITRIC OXIDE: Nitric Oxide: 7

## 2016-05-08 MED ORDER — FLUTICASONE PROPIONATE 50 MCG/ACT NA SUSP: 2.0000 | Freq: Every day | NASAL | Status: DC

## 2016-05-08 NOTE — Progress Notes (Signed)
Subjective:     Patient ID: Glenda Mendez, female   DOB: [birth year], 25 y.o.   MRN: [id number]  PCP Lottie Dawson, MD   HPI  IOV 05/08/2016  Chief Complaint  Patient presents with  . Pulnonary Consult    Ref. by Dr. Rudi Coco since 1993. Since 2015-multiple er visits and ems with increased sob and chest tightness.Blue color at times during these episodes.On Prednisone (pulmonary md at [facility]-Dr. Everlean Alstrom md left) on 5 wks. now10 mg daily.no wheezing.   25 year old obese F American female brought in by her adopted mother. Patient is to be followed at [facility] for asthma. But the doctor there has left. Therefore she is new consultation here and transfer of care. 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 However in her entire life EMS was never called for asthma but in May 2015 in May 2016 this happened. Then in 2016 though multiple episodes of requiring prednisone at least 4 times. Then in the fall of 2016 at [facility] they did blood allergy panel, IgE and complete blood count this was normal except for years and feels that was slightly elevated at 100 cells per cubic millimeter. Patient also mother does not recollect having had an exhaled nitric oxide. At this point in time she was only on Spiriva and Advair. They reported that at that point in time. Tried Singulair for many years but it was not working so there will not taking it. It appears from talking to the family and review of the chart Nucala was considered and patient paid out of pocket for it and took it for 3 months in the early part of 2017 and felt significant relief. However by March 2017 she did not have money to for this out of pocket. Then starting May 2017 through June 2017 multiple EMS calls and requiring prednisone repeatedly that one month. Then in June 2017 Dr. Deeann Dowse started chronic  daily prednisone till she saw me this visit. Patient's currently taking 10 mg per day. According to the patient on the mom this is helped her significantly and she is feeling much improved on the combination of prednisone Spiriva and Advair. Nevertheless asthma control questionnaire shows a score of 2 suggesting significant active symptoms. At night she hardly ever wakes up because of asthma. When she wakes up she has moderate symptoms. She is limited extremely with her daily activities because of asthma and she has a little shortness of breath she is not wheezing or using albuterol for rescue this past week.  Of note only function tests is always been normal at [facility]. Blood allergy panel has been normal IgE has been normal complete blood count has been normal.  This no history of CT chest with CT sinus a sleep apnea workup in the last few years. Patient does not recollect having had an echocardiogram.     Ast herehma Control Panel 05/08/2016    Spiriva, Advair, daily prednisone since June   ACQ 5 point- 1 week. wtd avg score. <1.0 is good control 0.75-1.25 is grey zone. >1.25 poor control. Delta 0.5 is clinically meaningful 2  ACQ 7 point - 1 week. wtd avg score. <1.0 is good control 0.75-1.25 is grey zone. >1.25 poor control. Delta 0.5 is clinically meaningful x  ACT - a [employer] test - 4 week. Total score. Max is 25, Lower score is worse.  <19 =  poor control x  FeNO ppB 7ppbDone while on chronic steroids and normal   FeV1    Planned intervention  for visit      Asthma related data  This was 6 2001: Upper endoscopy biopsy showed gastroesophageal reflux  10/07/2003 as a child she had sleep study which is reported as normal at [facility]  12/30/2007 she had CT neck and CT brain: She showed no masses or lymphadenopathy in the neck but had long segment high-grade narrowing of the right common carotid with reconstitution of the bifurcation. But normal range  01/23/2014 pulmonary  function test at [facility] 4.09L//97%, FEV1 2.44 L/95% ratio of 87. Total lung capacity and DLCO not available. Off note she also had normal spirometry and total lung capacity in 2014 and 2013  07/16/2015: Blood allergy panel IgE was negative including Aspergillus fumigatus. She had eosinophils of 3.1% with absolute eosinophil count of 260. Review of her use of the count in our chart shows eosinophils just over 200 cells per cubic millimeter for many years. She had a blood IgE of 132 [in 2005 it was normal in the 30s] which is within normal range at [facility]   has a past medical history of Asthma; Pulmonary hemorrhage of fetus or newborn; Hypoxia of newborn; Hearing loss in right ear; Hemiparesis ([place]); Allergy; ADHD (attention deficit hyperactivity disorder); Irregular periods (06/04/2011); BMI (body mass index), pediatric, 95-99% for age (02/18/2011); and PCOS (polycystic ovarian syndrome).   reports that she has never smoked. She has never used smokeless tobacco.  Past Surgical History  Procedure Laterality Date  . Tonsillectomy      No Known Allergies  Immunization History  Administered Date(s) Administered  . H1N1 11/03/2008  . Hepatitis A 11/26/2007, 11/20/2009  . Hpv 10/28/2006, 11/26/2007, 11/20/2009  . Influenza Split 07/29/2011, 07/01/2012  . Influenza Whole 08/31/2007, 08/02/2008, 06/18/2010  . Influenza,inj,Quad PF,36+ Mos 08/05/2013, 06/20/2014, 06/12/2015  . Meningococcal Polysaccharide 11/26/2007  . Varicella 11/26/2007    Family History  Problem Relation Age of Onset  . Adopted: Yes  . Hypertension    . Schizophrenia Mother     with alcohol drug abuse  . Diabetes Other      Current outpatient prescriptions:  .  albuterol (PROVENTIL HFA;VENTOLIN HFA) 108 (90 BASE) MCG/ACT inhaler, Inhale 2 puffs into the lungs every 6 (six) hours as needed for wheezing or shortness of breath., Disp: 1 Inhaler, Rfl: 4 .  albuterol (PROVENTIL) (2.5 MG/3ML) 0.083%  nebulizer solution, Take 3 mLs (2.5 mg total) by nebulization every 4 (four) hours as needed for wheezing or shortness of breath., Disp: 75 mL, Rfl: 2 .  fluticasone-salmeterol (ADVAIR HFA) 230-21 MCG/ACT inhaler, Inhale 2 puffs into the lungs 2 (two) times daily., Disp: , Rfl:  .  ipratropium (ATROVENT) 0.02 % nebulizer solution, USE 1 VIAL BY NEBULIZATION 3 (THREE) TIMES DAILY AS NEEDED FOR WHEEZING., Disp: 62.5 mL, Rfl: 3 .  metFORMIN (GLUCOPHAGE) 500 MG tablet, Take 2 tablets (1,000 mg total) by mouth 2 (two) times daily with a meal., Disp: 120 tablet, Rfl: 5 .  methylphenidate 36 MG PO CR tablet, Take 2 tablets (72 mg total) by mouth daily., Disp: 60 tablet, Rfl: 0 .  norgestimate-ethinyl estradiol (ORTHO-CYCLEN, 28,) 0.25-35 MG-MCG tablet, Take 1 tablet by mouth daily., Disp: 1 Package, Rfl: 5 .  omeprazole (PRILOSEC) 20 MG capsule, Take 1 capsule (20 mg total) by mouth 2 (two) times daily before a meal., Disp: 60 capsule, Rfl: 3 .  predniSONE (DELTASONE) 10  MG tablet, Take 1 tablet daily, Disp: , Rfl: 0 .  tiotropium (SPIRIVA) 18 MCG inhalation capsule, Place 18 mcg into inhaler and inhale daily., Disp: , Rfl:  .  triamcinolone cream (KENALOG) 0.5 %, Apply topically. Use as directed, Disp: , Rfl:  .  [DISCONTINUED] montelukast (SINGULAIR) 10 MG tablet, Take 1 tablet (10 mg total) by mouth at bedtime. (Patient not taking: Reported on 03/07/2016), Disp: 90 tablet, Rfl: 3 .  [DISCONTINUED] norethindrone-ethinyl estradiol (JUNEL FE,GILDESS FE,LOESTRIN FE) 1-20 MG-MCG tablet, Take 1 tablet by mouth daily. (Patient not taking: Reported on 03/07/2016), Disp: 1 Package, Rfl: 11       Review of Systems  Constitutional: Negative for fever and unexpected weight change.  HENT: Negative for congestion, dental problem, ear pain, nosebleeds, postnasal drip, rhinorrhea, sinus pressure, sneezing, sore throat and trouble swallowing.   Eyes: Negative for redness and itching.  Respiratory: Positive for cough,  shortness of breath and wheezing. Negative for chest tightness.   Cardiovascular: Negative for palpitations and leg swelling.  Gastrointestinal: Negative for nausea and vomiting.  Genitourinary: Negative for dysuria.  Musculoskeletal: Negative for joint swelling.  Skin: Negative for rash.  Neurological: Negative for headaches.  Hematological: Does not bruise/bleed easily.  Psychiatric/Behavioral: Negative for dysphoric mood. The patient is not nervous/anxious.        Objective:   Physical Exam  Constitutional: She is oriented to person, place, and time. She appears well-developed and well-nourished. No distress.  Estimated body mass index is 44.72 kg/(m^2) as calculated from the following:   Height as of this encounter: 5' 7.5" (1.715 m).   Weight as of this encounter: 290 lb (131.543 kg).   HENT:  Head: Normocephalic and atraumatic.  Right Ear: External ear normal.  Left Ear: External ear normal.  Mouth/Throat: Oropharynx is clear and moist. No oropharyngeal exudate.  Eyes: Conjunctivae and EOM are normal. Pupils are equal, round, and reactive to light. Right eye exhibits no discharge. Left eye exhibits no discharge. No scleral icterus.  Neck: Normal range of motion. Neck supple. No JVD present. No tracheal deviation present. No thyromegaly present.  Cardiovascular: Normal rate, regular rhythm, normal heart sounds and intact distal pulses.  Exam reveals no gallop and no friction rub.   No murmur heard. Pulmonary/Chest: Effort normal and breath sounds normal. No respiratory distress. She has no wheezes. She has no rales. She exhibits no tenderness.  Abdominal: Soft. Bowel sounds are normal. She exhibits no distension and no mass. There is no tenderness. There is no rebound and no guarding.  Musculoskeletal: Normal range of motion. She exhibits no edema or tenderness.  Lymphadenopathy:    She has no cervical adenopathy.  Neurological: She is alert and oriented to person, place, and  time. She has normal reflexes. No cranial nerve deficit. She exhibits normal muscle tone. Coordination normal.  Skin: Skin is warm and dry. No rash noted. She is not diaphoretic. No erythema. No pallor.  Psychiatric: She has a normal mood and affect. Her behavior is normal. Judgment and thought content normal.  Vitals reviewed.   Filed Vitals:   05/08/16 1055  BP: 128/82  Pulse: 105  Height: 5' 7.5" (1.715 m)  Weight: 290 lb (131.543 kg)  SpO2: 97%   Estimated body mass index is 44.72 kg/(m^2) as calculated from the following:   Height as of this encounter: 5' 7.5" (1.715 m).   Weight as of this encounter: 290 lb (131.543 kg).      Assessment:  ICD-9-CM ICD-10-CM   1. Asthma, unspecified asthma severity, uncomplicated 123456 Q000111Q Nitric oxide     Pulmonary function test     CT Maxillofacial LTD WO CM     CT Chest High Resolution  2. Dyspnea and respiratory abnormality 786.09 R06.00 Pulmonary function test    R06.89 CT Maxillofacial LTD WO CM     CT Chest High Resolution     ECHOCARDIOGRAM COMPLETE  3. Wheezing 786.07 R06.2        Plan:     She is a strong history of clinical asthma but at this point in time because of tubal inhaler therapy and chronic steroids all parameters are normal. However she still has symptoms out of proportion to the degree of objective control. Therefore will initiate CT sinus and CT chest and echocardiogram to look for other etiologies. If these are normal we'll proceed with cardiac stress testing. Meanwhile I've asked her to taper the prednisone down to 5 mg per day on days of normal exhaled nitric oxide.  At some point if you're unable to get her out of prednisone because of the symptoms might have t try empiric Nucala but we will have established objective evidence of high eos for this. Allergy testing can also be an option   At end of the day will have have objective evidence to treat his symptoms and if you're unable to find any might  have have ENT evaluation looking for vocal cord dysfunction     She and her mom agreeable with the plan    Dr. Brand Males, M.D., [facility].C.P Pulmonary and Critical Care Medicine Staff Physician [facility] Pager: [phone], If no answer or between  15:00h - 7:00h: call [phone]  05/08/2016 11:38 AM

## 2016-05-08 NOTE — Patient Instructions (Addendum)
ICD-9-CM ICD-10-CM   1. Asthma, unspecified asthma severity, uncomplicated 123456 Q000111Q Nitric oxide  2. Dyspnea and respiratory abnormality 786.09 R06.00     R06.89   3. Wheezing 786.07 R06.2    Reduce prednisone to 5mg  per day START generic fluticasone inhaler 2 squirts each nostril daily Continue advair and spiriva as before Do full PFT Do CT sinus without contrast Do HRCT without contrast Do echo heart    Followup Refer sleeep doc  returnt to see me in next 1-2 weeks but after completion of above

## 2016-05-09 ENCOUNTER — Other Ambulatory Visit (HOSPITAL_COMMUNITY): Payer: Federal, State, Local not specified - PPO

## 2016-05-12 ENCOUNTER — Ambulatory Visit (INDEPENDENT_AMBULATORY_CARE_PROVIDER_SITE_OTHER)
Admission: RE | Admit: 2016-05-12 | Discharge: 2016-05-12 | Disposition: A | Discharge status: Home / Self Care | Payer: Federal, State, Local not specified - PPO | Source: Ambulatory Visit | Attending: Internal Medicine | Admitting: Internal Medicine

## 2016-05-12 ENCOUNTER — Ambulatory Visit (HOSPITAL_COMMUNITY)
Admission: RE | Admit: 2016-05-12 | Discharge: 2016-05-12 | Disposition: A | Discharge status: Home / Self Care | Payer: Federal, State, Local not specified - PPO | Source: Ambulatory Visit | Attending: Internal Medicine | Admitting: Internal Medicine

## 2016-05-12 DIAGNOSIS — J45909 Unspecified asthma, uncomplicated: Secondary | ICD-10-CM

## 2016-05-12 DIAGNOSIS — R0600 Dyspnea, unspecified: Secondary | ICD-10-CM | POA: Insufficient documentation

## 2016-05-12 DIAGNOSIS — J3489 Other specified disorders of nose and nasal sinuses: Secondary | ICD-10-CM | POA: Diagnosis not present

## 2016-05-12 DIAGNOSIS — R0602 Shortness of breath: Secondary | ICD-10-CM | POA: Diagnosis not present

## 2016-05-12 DIAGNOSIS — R0689 Other abnormalities of breathing: Secondary | ICD-10-CM

## 2016-05-12 LAB — PULMONARY FUNCTION TEST
DL/VA % pred: 80 %
DL/VA: 4.15 ml/min/mmHg/L
DLCO unc % pred: 71 %
DLCO unc: 20.3 ml/min/mmHg
FEF 25-75 Post: 4.02 L/sec
FEF 25-75 Pre: 3.88 L/sec
FEF2575-%Change-Post: 3 %
FEF2575-%Pred-Post: 109 %
FEF2575-%Pred-Pre: 105 %
FEV1-%Change-Post: 4 %
FEV1-%Pred-Post: 109 %
FEV1-%Pred-Pre: 105 %
FEV1-Post: 3.43 L
FEV1-Pre: 3.29 L
FEV1FVC-%Change-Post: 1 %
FEV1FVC-%Pred-Pre: 99 %
FEV6-%Change-Post: 2 %
FEV6-%Pred-Post: 108 %
FEV6-%Pred-Pre: 105 %
FEV6-Post: 3.9 L
FEV6-Pre: 3.79 L
FEV6FVC-%Pred-Post: 101 %
FEV6FVC-%Pred-Pre: 101 %
FVC-%Change-Post: 2 %
FVC-%Pred-Post: 107 %
FVC-%Pred-Pre: 104 %
FVC-Post: 3.9 L
FVC-Pre: 3.79 L
Post FEV1/FVC ratio: 88 %
Post FEV6/FVC ratio: 100 %
Pre FEV1/FVC ratio: 87 %
Pre FEV6/FVC Ratio: 100 %
RV % pred: 120 %
RV: 1.7 L
TLC % pred: 104 %
TLC: 5.74 L

## 2016-05-12 MED ORDER — ALBUTEROL SULFATE (2.5 MG/3ML) 0.083% IN NEBU
2.5000 mg | INHALATION_SOLUTION | Freq: Once | RESPIRATORY_TRACT | Status: AC
  Administered 2016-05-12: 2.5 mg via RESPIRATORY_TRACT

## 2016-05-13 ENCOUNTER — Telehealth: Payer: Self-pay | Admitting: Internal Medicine

## 2016-05-13 NOTE — Telephone Encounter (Signed)
Called spoke with pt. Reviewed MR's recs and results. She voiced understanding and had no further questions. Nothing further needed.

## 2016-05-13 NOTE — Telephone Encounter (Signed)
Let Glenda Mendez 15-Apr-1991  Know that CT sinus and CT chest and PFT all normal   Ct Chest High Resolution  Result Date: 05/13/2016 CLINICAL DATA:  25 year old female with history of chronic shortness of breath. Evaluate for potential interstitial lung disease. EXAM: CT CHEST WITHOUT CONTRAST TECHNIQUE: Multidetector CT imaging of the chest was performed following the standard protocol without intravenous contrast. High resolution imaging of the lungs, as well as inspiratory and expiratory imaging, was performed. COMPARISON:  No priors. FINDINGS: Cardiovascular: Heart size is normal. There is no significant pericardial fluid, thickening or pericardial calcification. No atherosclerotic calcifications are noted in the thoracic aorta or the coronary arteries. Mediastinum/Nodes: No pathologically enlarged mediastinal or hilar lymph nodes. Please note that accurate exclusion of hilar adenopathy is limited on noncontrast CT scans. Esophagus is unremarkable in appearance. No axillary lymphadenopathy. Lungs/Pleura: High-resolution images demonstrate no significant regions of ground-glass attenuation, subpleural reticulation, parenchymal banding, traction bronchiectasis or frank honeycombing to indicate interstitial lung disease. Inspiratory and expiratory imaging demonstrates some mild air trapping, indicative of mild small airways disease. No acute consolidative airspace disease. No pleural effusions. No suspicious appearing pulmonary nodules or masses. Upper Abdomen: Unremarkable. Musculoskeletal: There are no aggressive appearing lytic or blastic lesions noted in the visualized portions of the skeleton. IMPRESSION: 1. No findings to suggest interstitial lung disease. 2. Mild air trapping, indicative of mild small airways disease. 3. No acute findings in the thorax. Electronically Signed   By: [name] M.D.   On: 05/13/2016 09:59  Ct Maxillofacial Ltd Wo Cm  Result Date: 05/12/2016 CLINICAL DATA:   Asthma. Chronic sinus infections with interstitial lung disease. EXAM: CT PARANASAL SINUS LIMITED WITHOUT CONTRAST TECHNIQUE: Non-contiguous multidetector CT images of the paranasal sinuses were obtained in a single plane without contrast. COMPARISON:  None. FINDINGS: The paranasal sinuses are well aerated. There is no mucoperiosteal thickening or air-fluid levels. The visualized mastoid air cells and middle ears are clear. No orbital abnormalities are seen. The visualized intracranial contents are unremarkable. IMPRESSION: Negative limited paranasal sinus CT. No evidence of active sinus disease. Electronically Signed   By: [name] M.D.   On: 05/12/2016 19:51

## 2016-05-14 ENCOUNTER — Ambulatory Visit (HOSPITAL_COMMUNITY): Payer: Federal, State, Local not specified - PPO | Attending: Cardiovascular Disease

## 2016-05-14 ENCOUNTER — Other Ambulatory Visit (HOSPITAL_COMMUNITY): Payer: Self-pay

## 2016-05-14 ENCOUNTER — Ambulatory Visit: Payer: Federal, State, Local not specified - PPO | Admitting: Internal Medicine

## 2016-05-14 DIAGNOSIS — Z6841 Body Mass Index (BMI) 40.0 and over, adult: Secondary | ICD-10-CM | POA: Insufficient documentation

## 2016-05-14 DIAGNOSIS — E119 Type 2 diabetes mellitus without complications: Secondary | ICD-10-CM | POA: Diagnosis not present

## 2016-05-14 DIAGNOSIS — R0689 Other abnormalities of breathing: Secondary | ICD-10-CM | POA: Insufficient documentation

## 2016-05-14 DIAGNOSIS — R0600 Dyspnea, unspecified: Secondary | ICD-10-CM | POA: Diagnosis not present

## 2016-05-14 DIAGNOSIS — I10 Essential (primary) hypertension: Secondary | ICD-10-CM | POA: Insufficient documentation

## 2016-05-15 ENCOUNTER — Ambulatory Visit (INDEPENDENT_AMBULATORY_CARE_PROVIDER_SITE_OTHER): Payer: Federal, State, Local not specified - PPO | Admitting: Internal Medicine

## 2016-05-15 ENCOUNTER — Encounter: Payer: Self-pay | Admitting: Internal Medicine

## 2016-05-15 VITALS — BP 128/76 | HR 94 | Ht 67.5 in | Wt 287.0 lb

## 2016-05-15 DIAGNOSIS — R0689 Other abnormalities of breathing: Secondary | ICD-10-CM

## 2016-05-15 DIAGNOSIS — R0600 Dyspnea, unspecified: Secondary | ICD-10-CM | POA: Diagnosis not present

## 2016-05-15 DIAGNOSIS — J45909 Unspecified asthma, uncomplicated: Secondary | ICD-10-CM | POA: Diagnosis not present

## 2016-05-15 NOTE — Progress Notes (Signed)
Subjective:     Patient ID: Glenda Mendez, female   DOB: 06/03/1991, 25 y.o.   MRN: [id number]  HPI    IOV 05/08/2016  Chief Complaint  Patient presents with  . Pulnonary Consult    Ref. by Dr. Rudi Coco since 1993. Since 2015-multiple er visits and ems with increased sob and chest tightness.Blue color at times during these episodes.On Prednisone (pulmonary md at [facility]-Dr. Everlean Alstrom md left) on 5 wks. now10 mg daily.no wheezing.   25 year old obese F American female brought in by her adopted mother. Patient is to be followed at [facility] for asthma. But the doctor there has left. Therefore she is new consultation here and transfer of care. 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 However in her entire life EMS was never called for asthma but in May 2015 in May 2016 this happened. Then in 2016 though multiple episodes of requiring prednisone at least 4 times. Then in the fall of 2016 at [facility] they did blood allergy panel, IgE and complete blood count this was normal except for years and feels that was slightly elevated at 100 cells per cubic millimeter. Patient also mother does not recollect having had an exhaled nitric oxide. At this point in time she was only on Spiriva and Advair. They reported that at that point in time. Tried Singulair for many years but it was not working so there will not taking it. It appears from talking to the family and review of the chart Nucala was considered and patient paid out of pocket for it and took it for 3 months in the early part of 2017 and felt significant relief. However by March 2017 she did not have money to for this out of pocket. Then starting May 2017 through June 2017 multiple EMS calls and requiring prednisone repeatedly that one month. Then in June 2017 Dr. Deeann Dowse started chronic daily prednisone till she saw  me this visit. Patient's currently taking 10 mg per day. According to the patient on the mom this is helped her significantly and she is feeling much improved on the combination of prednisone Spiriva and Advair. Nevertheless asthma control questionnaire shows a score of 2 suggesting significant active symptoms. At night she hardly ever wakes up because of asthma. When she wakes up she has moderate symptoms. She is limited extremely with her daily activities because of asthma and she has a little shortness of breath she is not wheezing or using albuterol for rescue this past week.  Of note only function tests is always been normal at [facility]. Blood allergy panel has been normal IgE has been normal complete blood count has been normal.  This no history of CT chest with CT sinus a sleep apnea workup in the last few years. Patient does not recollect having had an echocardiogram.     Ast herehma Control Panel 05/08/2016    Spiriva, Advair, daily prednisone since June   ACQ 5 point- 1 week. wtd avg score. <1.0 is good control 0.75-1.25 is grey zone. >1.25 poor control. Delta 0.5 is clinically meaningful 2  ACQ 7 point - 1 week. wtd avg score. <1.0 is good control 0.75-1.25 is grey zone. >1.25 poor control. Delta 0.5 is clinically meaningful x  ACT - a [employer] test - 4 week. Total score. Max is 25, Lower score is worse.  <19 = poor control x  FeNO ppB 7ppbDone while on chronic steroids and normal   FeV1    Planned intervention  for visit      Asthma related data  This was 6 2001: Upper endoscopy biopsy showed gastroesophageal reflux  10/07/2003 as a child she had sleep study which is reported as normal at [facility]  12/30/2007 she had CT neck and CT brain: She showed no masses or lymphadenopathy in the neck but had long segment high-grade narrowing of the right common carotid with reconstitution of the bifurcation. But normal range  01/23/2014 pulmonary function test at [facility] 4.09L//97%, FEV1 2.44 L/95% ratio of 87. Total lung capacity and DLCO not available. Off note she also had normal spirometry and total lung capacity in 2014 and 2013  07/16/2015: Blood allergy panel IgE was negative including Aspergillus fumigatus. She had eosinophils of 3.1% with absolute eosinophil count of 260. Review of her use of the count in our chart shows eosinophils just over 200 cells per cubic millimeter for many years. She had a blood IgE of 132 [in 2005 it was normal in the 30s] which is within normal range at [facility]   has a past medical history of Asthma; Pulmonary hemorrhage of fetus or newborn; Hypoxia of newborn; Hearing loss in right ear; Hemiparesis ([place]); Allergy; ADHD (attention deficit hyperactivity disorder); Irregular periods (06/04/2011); BMI (body mass index), pediatric, 95-99% for age (02/18/2011); and PCOS (polycystic ovarian syndrome).Let Glenda Mendez 1991/04/14  Know that CT sinus and CT chest and PFT all normal   OV 05/15/2016  Chief Complaint  Patient presents with  . Follow-up    review echo, ct, and pft.  pt has no change in breathing since last OV.       Ct Chest High Resolution  Result Date: 05/13/2016 CLINICAL DATA:  25 year old female with history of chronic shortness of breath. Evaluate for potential interstitial lung disease. EXAM: CT CHEST WITHOUT CONTRAST TECHNIQUE: Multidetector CT imaging of the chest was performed following the standard protocol without intravenous contrast. High resolution imaging of the lungs, as well as inspiratory and expiratory imaging, was performed. COMPARISON:  No priors. FINDINGS: Cardiovascular: Heart size is normal. There is no significant pericardial fluid, thickening or pericardial calcification. No atherosclerotic calcifications are noted in the thoracic aorta or the coronary arteries. Mediastinum/Nodes: No pathologically enlarged mediastinal or hilar lymph nodes. Please note that accurate exclusion of hilar  adenopathy is limited on noncontrast CT scans. Esophagus is unremarkable in appearance. No axillary lymphadenopathy. Lungs/Pleura: High-resolution images demonstrate no significant regions of ground-glass attenuation, subpleural reticulation, parenchymal banding, traction bronchiectasis or frank honeycombing to indicate interstitial lung disease. Inspiratory and expiratory imaging demonstrates some mild air trapping, indicative of mild small airways disease. No acute consolidative airspace disease. No pleural effusions. No suspicious appearing pulmonary nodules or masses. Upper Abdomen: Unremarkable. Musculoskeletal: There are no aggressive appearing lytic or blastic lesions noted in the visualized portions of the skeleton. IMPRESSION: 1. No findings to suggest interstitial lung disease. 2. Mild air trapping, indicative of mild small airways disease. 3. No acute findings in the thorax. Electronically Signed   By: [name] M.D.   On: 05/13/2016 09:59  Ct Maxillofacial Ltd Wo Cm  Result Date: 05/12/2016 CLINICAL DATA:  Asthma. Chronic sinus infections with interstitial lung disease. EXAM: CT PARANASAL SINUS LIMITED WITHOUT CONTRAST TECHNIQUE: Non-contiguous multidetector CT images of the paranasal sinuses were obtained in a single plane without contrast. COMPARISON:  None. FINDINGS: The paranasal sinuses are well aerated. There is  no mucoperiosteal thickening or air-fluid levels. The visualized mastoid air cells and middle ears are clear. No orbital abnormalities are seen. The visualized intracranial contents are unremarkable. IMPRESSION: Negative limited paranasal sinus CT. No evidence of active sinus disease. Electronically Signed   By: [name] M.D.   On: 05/12/2016 19:51    Echocardiogram 05/14/2016: Normal    Current Outpatient Prescriptions:  .  albuterol (PROVENTIL HFA;VENTOLIN HFA) 108 (90 BASE) MCG/ACT inhaler, Inhale 2 puffs into the lungs every 6 (six) hours as needed for  wheezing or shortness of breath., Disp: 1 Inhaler, Rfl: 4 .  albuterol (PROVENTIL) (2.5 MG/3ML) 0.083% nebulizer solution, Take 3 mLs (2.5 mg total) by nebulization every 4 (four) hours as needed for wheezing or shortness of breath., Disp: 75 mL, Rfl: 2 .  fluticasone (FLONASE) 50 MCG/ACT nasal spray, Place 2 sprays into both nostrils daily., Disp: 16 g, Rfl: 2 .  fluticasone-salmeterol (ADVAIR HFA) 230-21 MCG/ACT inhaler, Inhale 2 puffs into the lungs 2 (two) times daily., Disp: , Rfl:  .  ipratropium (ATROVENT) 0.02 % nebulizer solution, USE 1 VIAL BY NEBULIZATION 3 (THREE) TIMES DAILY AS NEEDED FOR WHEEZING., Disp: 62.5 mL, Rfl: 3 .  metFORMIN (GLUCOPHAGE) 500 MG tablet, Take 2 tablets (1,000 mg total) by mouth 2 (two) times daily with a meal., Disp: 120 tablet, Rfl: 5 .  methylphenidate 36 MG PO CR tablet, Take 2 tablets (72 mg total) by mouth daily., Disp: 60 tablet, Rfl: 0 .  norgestimate-ethinyl estradiol (ORTHO-CYCLEN, 28,) 0.25-35 MG-MCG tablet, Take 1 tablet by mouth daily., Disp: 1 Package, Rfl: 5 .  omeprazole (PRILOSEC) 20 MG capsule, Take 1 capsule (20 mg total) by mouth 2 (two) times daily before a meal., Disp: 60 capsule, Rfl: 3 .  predniSONE (DELTASONE) 10 MG tablet, Take 1 tablet daily, Disp: , Rfl: 0 .  tiotropium (SPIRIVA) 18 MCG inhalation capsule, Place 18 mcg into inhaler and inhale daily., Disp: , Rfl:  .  triamcinolone cream (KENALOG) 0.5 %, Apply topically. Use as directed, Disp: , Rfl:    Review of Systems     Objective:   Physical Exam  Vitals:   05/15/16 1056  BP: 128/76  Pulse: 94  SpO2: 98%  Weight: 287 lb (130.2 kg)  Height: 5' 7.5" (1.715 m)          Assessment:       ICD-9-CM ICD-10-CM   1. Dyspnea and respiratory abnormality 786.09 R06.00     R06.89   2. Asthma, unspecified asthma severity, uncomplicated 123456 Q000111Q        Plan:       Asthma appears well controlled according to tests  shortness of breath is out of proportion to the  level of asthma  I am unable to fully explain why you have out of proportion shortness of breath  Plan - Stop Spiriva - Continue Advair and other medications as before - Continue prednisone but below the lower dose 5 mg per day - Do bike cardiopulmonary stress test with EIB challenge at [facility] with Landis Martins   follow-up - 4 weeks with myself or nurse practitioner to review stress test results  - Repeat exhaled nitric oxide at follow-up     > 50% of this > 15 min visit spent in face to face counseling or coordination of care    Dr. Brand Males, M.D., [facility].C.P Pulmonary and Critical Care Medicine Staff Physician [place] [facility] Pager: [phone], If no  answer or between  15:00h - 7:00h: call [phone]  05/15/2016 11:13 AM

## 2016-05-15 NOTE — Patient Instructions (Addendum)
ICD-9-CM ICD-10-CM   1. Dyspnea and respiratory abnormality 786.09 R06.00     R06.89   2. Asthma, unspecified asthma severity, uncomplicated 123456 [id number]     Asthma appears well controlled according to tests  shortness of breath is out of proportion to the level of asthma  I am unable to fully explain why you have out of proportion shortness of breath  Plan - Stop Spiriva - Continue Advair and other medications as before - Continue prednisone but below the lower dose 5 mg per day - Do bike cardiopulmonary stress test with EIB challenge at [facility] with Landis Martins   follow-up - 4 weeks with myself or nurse practitioner to review stress test results  - Repeat exhaled nitric oxide at follow-up

## 2016-05-15 NOTE — Addendum Note (Signed)
Addended by: Len Blalock on: 05/15/2016 11:20 AM   Modules accepted: Orders

## 2016-05-19 ENCOUNTER — Other Ambulatory Visit (HOSPITAL_COMMUNITY): Payer: Self-pay | Admitting: *Deleted

## 2016-05-19 ENCOUNTER — Ambulatory Visit (HOSPITAL_COMMUNITY): Payer: Federal, State, Local not specified - PPO | Attending: Internal Medicine

## 2016-05-19 DIAGNOSIS — R0600 Dyspnea, unspecified: Secondary | ICD-10-CM

## 2016-05-19 DIAGNOSIS — R0689 Other abnormalities of breathing: Secondary | ICD-10-CM

## 2016-05-19 DIAGNOSIS — J45909 Unspecified asthma, uncomplicated: Secondary | ICD-10-CM | POA: Insufficient documentation

## 2016-05-20 ENCOUNTER — Other Ambulatory Visit: Payer: Self-pay | Admitting: Internal Medicine

## 2016-05-20 NOTE — Telephone Encounter (Signed)
DENIED.  MESSAGE SENT TO PHARMACY FOR THE PT TO CALL OFFICE.

## 2016-05-27 DIAGNOSIS — R0600 Dyspnea, unspecified: Secondary | ICD-10-CM | POA: Diagnosis not present

## 2016-06-12 ENCOUNTER — Encounter: Payer: Self-pay | Admitting: Internal Medicine

## 2016-06-12 ENCOUNTER — Ambulatory Visit (INDEPENDENT_AMBULATORY_CARE_PROVIDER_SITE_OTHER): Payer: Federal, State, Local not specified - PPO | Admitting: Internal Medicine

## 2016-06-12 VITALS — BP 124/70 | HR 72 | Ht 67.5 in | Wt 291.0 lb

## 2016-06-12 DIAGNOSIS — R0600 Dyspnea, unspecified: Secondary | ICD-10-CM | POA: Diagnosis not present

## 2016-06-12 DIAGNOSIS — R0689 Other abnormalities of breathing: Secondary | ICD-10-CM | POA: Diagnosis not present

## 2016-06-12 DIAGNOSIS — J45909 Unspecified asthma, uncomplicated: Secondary | ICD-10-CM

## 2016-06-12 NOTE — Patient Instructions (Signed)
ICD-9-CM ICD-10-CM   1. Asthma, unspecified asthma severity, uncomplicated 123456 [id number]   2. Dyspnea and respiratory abnormality 786.09 R06.00     R06.89     Shortness of breath is due to weight, diastolic dysfunction and some element of asthma possibly There might be vocal cord dysfunction to Today asthma appears well controlled  Plan - Slowly taper off prednisone -take prednisone 4 mg a day for 1 week and then 3mg  daily for 1 week and then 2 mg once a day for 1 week and then 1 mg daily for a week and stop - Continue Advair and other medications - Follow-up 4 weeks with my nurse practitioner was in a prednisone taper  - Exhaled nitric oxide testing at follow-up

## 2016-06-12 NOTE — Progress Notes (Signed)
Subjective:     Patient ID: Glenda Mendez, female   DOB: May 24, 1991, 24 y.o.   MRN: [id number]  HPI IOV 05/08/2016  Chief Complaint  Patient presents with  . Pulnonary Consult    Ref. by Dr. Rudi Coco since 1993. Since 2015-multiple er visits and ems with increased sob and chest tightness.Blue color at times during these episodes.On Prednisone (pulmonary md at [facility]-Dr. Everlean Alstrom md left) on 5 wks. now10 mg daily.no wheezing.   25 year old obese F American female brought in by her adopted mother. Patient is to be followed at [facility] for asthma. But the doctor there has left. Therefore she is new consultation here and transfer of care. 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 However in her entire life EMS was never called for asthma but in May 2015 in May 2016 this happened. Then in 2016 though multiple episodes of requiring prednisone at least 4 times. Then in the fall of 2016 at [facility] they did blood allergy panel, IgE and complete blood count this was normal except for years and feels that was slightly elevated at 100 cells per cubic millimeter. Patient also mother does not recollect having had an exhaled nitric oxide. At this point in time she was only on Spiriva and Advair. They reported that at that point in time. Tried Singulair for many years but it was not working so there will not taking it. It appears from talking to the family and review of the chart Nucala was considered and patient paid out of pocket for it and took it for 3 months in the early part of 2017 and felt significant relief. However by March 2017 she did not have money to for this out of pocket. Then starting May 2017 through June 2017 multiple EMS calls and requiring prednisone repeatedly that one month. Then in June 2017 Dr. Deeann Dowse started chronic daily prednisone till she saw me  this visit. Patient's currently taking 10 mg per day. According to the patient on the mom this is helped her significantly and she is feeling much improved on the combination of prednisone Spiriva and Advair. Nevertheless asthma control questionnaire shows a score of 2 suggesting significant active symptoms. At night she hardly ever wakes up because of asthma. When she wakes up she has moderate symptoms. She is limited extremely with her daily activities because of asthma and she has a little shortness of breath she is not wheezing or using albuterol for rescue this past week.  Of note only function tests is always been normal at [facility]. Blood allergy panel has been normal IgE has been normal complete blood count has been normal.  This no history of CT chest with CT sinus a sleep apnea workup in the last few years. Patient does not recollect having had an echocardiogram.     Ast herehma Control Panel 05/08/2016    Spiriva, Advair, daily prednisone since June   ACQ 5 point- 1 week. wtd avg score. <1.0 is good control 0.75-1.25 is grey zone. >1.25 poor control. Delta 0.5 is clinically meaningful 2  ACQ 7 point - 1 week. wtd avg score. <1.0 is good control 0.75-1.25 is grey zone. >1.25 poor control. Delta 0.5 is clinically meaningful x  ACT - a [employer] test - 4 week. Total score. Max is 25, Lower score is worse.  <19 = poor control x  FeNO ppB  7ppbDone while on chronic steroids and normal   FeV1    Planned intervention  for visit      Asthma related data  This was 6 2001: Upper endoscopy biopsy showed gastroesophageal reflux  10/07/2003 as a child she had sleep study which is reported as normal at [facility]  12/30/2007 she had CT neck and CT brain: She showed no masses or lymphadenopathy in the neck but had long segment high-grade narrowing of the right common carotid with reconstitution of the bifurcation. But normal range  01/23/2014 pulmonary function test at [facility] 4.09L//97%, FEV1 2.44 L/95% ratio of 87. Total lung capacity and DLCO not available. Off note she also had normal spirometry and total lung capacity in 2014 and 2013  07/16/2015: Blood allergy panel IgE was negative including Aspergillus fumigatus. She had eosinophils of 3.1% with absolute eosinophil count of 260. Review of her use of the count in our chart shows eosinophils just over 200 cells per cubic millimeter for many years. She had a blood IgE of 132 [in 2005 it was normal in the 30s] which is within normal range at [facility]   has a past medical history of Asthma; Pulmonary hemorrhage of fetus or newborn; Hypoxia of newborn; Hearing loss in right ear; Hemiparesis ([place]); Allergy; ADHD (attention deficit hyperactivity disorder); Irregular periods (06/04/2011); BMI (body mass index), pediatric, 95-99% for age (02/18/2011); and PCOS (polycystic ovarian syndrome).Let Glenda Mendez [birth year]  Know that CT sinus and CT chest and PFT all normal   OV 05/15/2016  Chief Complaint  Patient presents with  . Follow-up    review echo, ct, and pft.  pt has no change in breathing since last OV.       Ct Chest High Resolution  Result Date: 05/13/2016 CLINICAL DATA:  25 y.o. female with history of chronic shortness of breath. Evaluate for potential interstitial lung disease. EXAM: CT CHEST WITHOUT CONTRAST TECHNIQUE: Multidetector CT imaging of the chest was performed following the standard protocol without intravenous contrast. High resolution imaging of the lungs, as well as inspiratory and expiratory imaging, was performed. COMPARISON:  No priors. FINDINGS: Cardiovascular: Heart size is normal. There is no significant pericardial fluid, thickening or pericardial calcification. No atherosclerotic calcifications are noted in the thoracic aorta or the coronary arteries. Mediastinum/Nodes: No pathologically enlarged mediastinal or hilar lymph nodes. Please note that accurate exclusion of hilar  adenopathy is limited on noncontrast CT scans. Esophagus is unremarkable in appearance. No axillary lymphadenopathy. Lungs/Pleura: High-resolution images demonstrate no significant regions of ground-glass attenuation, subpleural reticulation, parenchymal banding, traction bronchiectasis or frank honeycombing to indicate interstitial lung disease. Inspiratory and expiratory imaging demonstrates some mild air trapping, indicative of mild small airways disease. No acute consolidative airspace disease. No pleural effusions. No suspicious appearing pulmonary nodules or masses. Upper Abdomen: Unremarkable. Musculoskeletal: There are no aggressive appearing lytic or blastic lesions noted in the visualized portions of the skeleton. IMPRESSION: 1. No findings to suggest interstitial lung disease. 2. Mild air trapping, indicative of mild small airways disease. 3. No acute findings in the thorax. Electronically Signed   By: [name] M.D.   On: 05/13/2016 09:59  Ct Maxillofacial Ltd Wo Cm  Result Date: 05/12/2016 CLINICAL DATA:  Asthma. Chronic sinus infections with interstitial lung disease. EXAM: CT PARANASAL SINUS LIMITED WITHOUT CONTRAST TECHNIQUE: Non-contiguous multidetector CT images of the paranasal sinuses were obtained in a single plane without contrast. COMPARISON:  None. FINDINGS: The paranasal sinuses are well aerated. There is no mucoperiosteal  thickening or air-fluid levels. The visualized mastoid air cells and middle ears are clear. No orbital abnormalities are seen. The visualized intracranial contents are unremarkable. IMPRESSION: Negative limited paranasal sinus CT. No evidence of active sinus disease. Electronically Signed   By: [name] M.D.   On: 05/12/2016 19:51    Echocardiogram 05/14/2016: Normal   OV 06/12/2016  Chief Complaint  Patient presents with  . Follow-up    Pt here after CPST. Pt denies change in SOB and cough. Pt states she has dry skin under her left eye.     Follow-up history of asthma with dyspnea. Here to review cardiopulmonary stress test results. This was done 05/19/2016 and I reviewed it. Obesity and diastolic dysfunction are major cause of dyspnea. Exercise induced bronchospasm response was borderline while she was on Advair and 5 mg daily prednisone. However today she is in stable health and exhaled nitric oxide while on the same Advair and prednisone is:  [place] - 6ppb. She and her stepmom somewhat reluctant to accept the findings of obesity and diastolic dysfunction. Mom's concern is why patient exacerbates every May 2017. But she is agreed to follow this. They're in agreement that prednisone needs to be tapered off as soon as possible. She was started on long-term prednisone in May 2017.      Review of Systems     Objective:   Physical Exam  Vitals:   06/12/16 1156  BP: 124/70  Pulse: 72  SpO2: 97%  Weight: 291 lb (132 kg)  Height: 5' 7.5" (1.715 m)   Discussion only visit     Assessment:       ICD-9-CM ICD-10-CM   1. Asthma, unspecified asthma severity, uncomplicated 123456 [id number]   2. Dyspnea and respiratory abnormality 786.09 R06.00     R06.89        Plan:      Shortness of breath is due to weight, diastolic dysfunction and some element of asthma possibly There might be vocal cord dysfunction to Today asthma appears well controlled  Plan - Slowly taper off prednisone -take prednisone 4 mg a day for 1 week and then 3mg  daily for 1 week and then 2 mg once a day for 1 week and then 1 mg daily for a week and stop - Continue Advair and other medications - Follow-up 4 weeks with my nurse practitioner was in a prednisone taper  - Exhaled nitric oxide testing at follow-up   (> 50% of this 15 min visit spent in face to face counseling or/and coordination of care)   Dr. Brand Males, M.D., [facility].C.P Pulmonary and Critical Care Medicine Staff Physician [place] [facility]  [facility] Pager: [phone], If no answer or between  15:00h - 7:00h: call [phone]  06/12/2016 12:42 PM

## 2016-06-17 LAB — NITRIC OXIDE: Nitric Oxide: 6

## 2016-06-17 NOTE — Addendum Note (Signed)
Addended by: Collier Salina on: 06/17/2016 04:58 PM   Modules accepted: Orders

## 2016-06-24 ENCOUNTER — Telehealth: Payer: Self-pay | Admitting: Internal Medicine

## 2016-06-24 MED ORDER — METHYLPHENIDATE HCL ER (OSM) 36 MG PO TBCR: 72.0000 mg | EXTENDED_RELEASE_TABLET | Freq: Every day | ORAL | 0 refills | Status: DC

## 2016-06-24 NOTE — Telephone Encounter (Signed)
Pt needs new rx methylphenidate 36 mg #60

## 2016-06-24 NOTE — Telephone Encounter (Signed)
Ok to refill  X 1 months .

## 2016-06-24 NOTE — Telephone Encounter (Signed)
Patient is past due for follow up.  Please have her get on the schedule.  Will then see if [facility] will fill.  Thanks!!

## 2016-06-24 NOTE — Telephone Encounter (Signed)
Patient notified to pick up 30 day supply at the front desk.

## 2016-06-24 NOTE — Telephone Encounter (Signed)
Pt has been sch for 06-26-16

## 2016-06-24 NOTE — Telephone Encounter (Signed)
lmom for pt call back.

## 2016-06-24 NOTE — Addendum Note (Signed)
Addended by: Miles Costain T on: 06/24/2016 05:19 PM   Modules accepted: Orders

## 2016-06-25 NOTE — Progress Notes (Signed)
No chief complaint on file.   HPI: Glenda Mendez 25 y.o.   Here for fu  adhd meds  Taking every day unless up late  Works "ok" but seems to wear off  Mom wil think helps more , no sig se of med at this time  resp issues asthma and  Recurrent sx   Under eval     Having itching for  At least a month and  pred helps but otherwise  Very uncomfortable seems to get rahs hive like at times   ? If allergic to red things such as tomatooes and dyes .   To have sleep eval . Still on metformin" needs to get back with endo " Can she get flu vaccine?  Pt aware that weight  Can make some things worse.   Has non tender bump roght labia  Please check worried about this ROS: See pertinent positives and negatives per HPI.  Past Medical History:  Diagnosis Date  . ADHD (attention deficit hyperactivity disorder)   . Allergy   . Asthma   . BMI (body mass index), pediatric, 95-99% for age 10/20/2010   Is starting to gain weight again and advised and counseled today about reduction for health risk reasons. Patient is very well aware of how to do lifestyle intervention she has done this before.   Marland Kitchen Hearing loss in right ear    with hearing aid bilateral   . Hemiparesis ([place])    rt from neonatal period  . Hypoxia of newborn   . Irregular periods 06/04/2011  . PCOS (polycystic ovarian syndrome)   . Pulmonary hemorrhage of fetus or newborn    37 week C-section [facility] of with birth hypoxia    Family History  Problem Relation Age of Onset  . Adopted: Yes  . Hypertension    . Schizophrenia Mother     with alcohol drug abuse  . Diabetes Other     Social History   Social History  . Marital status: Single    Spouse name: N/A  . Number of children: N/A  . Years of education: N/A   Social History Main Topics  . Smoking status: Never Smoker  . Smokeless tobacco: Never Used  . Alcohol use No  . Drug use: No  . Sexual activity: Yes    Birth control/ protection: Pill   Other Topics Concern  . 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 [name]- pulm at [facility] now has a new pulmonary Dr. Now stable and on prn       Thyroid evaluation in the past neck evaluation within normal limits and no thyroid disease.    Outpatient Medications Prior to Visit  Medication Sig Dispense Refill  . albuterol (PROVENTIL HFA;VENTOLIN HFA) 108 (90 BASE) MCG/ACT inhaler Inhale 2 puffs into the lungs every 6 (six) hours as needed for wheezing or shortness of breath. 1 Inhaler 4  . albuterol (PROVENTIL) (2.5 MG/3ML) 0.083% nebulizer solution Take 3 mLs (2.5 mg total) by nebulization every 4 (four) hours as needed for wheezing or shortness of breath. 75 mL 2  . fluticasone (FLONASE) 50 MCG/ACT nasal spray Place 2 sprays into both nostrils daily. 16 g 2  . fluticasone-salmeterol (ADVAIR HFA) 230-21 MCG/ACT inhaler Inhale 2  puffs into the lungs 2 (two) times daily.    Marland Kitchen ipratropium (ATROVENT) 0.02 % nebulizer solution USE 1 VIAL BY NEBULIZATION 3 (THREE) TIMES DAILY AS NEEDED FOR WHEEZING. 62.5 mL 3  . metFORMIN (GLUCOPHAGE) 500 MG tablet Take 2 tablets (1,000 mg total) by mouth 2 (two) times daily with a meal. 120 tablet 5  . norgestimate-ethinyl estradiol (ORTHO-CYCLEN, 28,) 0.25-35 MG-MCG tablet Take 1 tablet by mouth daily. 1 Package 5  . omeprazole (PRILOSEC) 20 MG capsule Take 1 capsule (20 mg total) by mouth 2 (two) times daily before a meal. 60 capsule 3  . predniSONE (DELTASONE) 10 MG tablet Take 1 tablet daily  0  . tiotropium (SPIRIVA) 18 MCG inhalation capsule Place 18 mcg into inhaler and inhale daily.    Marland Kitchen triamcinolone cream (KENALOG) 0.5 % Apply topically. Use as directed    . methylphenidate 36 MG PO CR tablet Take 2 tablets (72 mg total) by mouth daily. 60 tablet 0   No facility-administered medications prior to visit.       EXAM:  BP 118/70   Pulse 74   Temp 98 F (36.7 C)   Wt 294 lb (133.4 kg)   BMI 45.37 kg/m   Body mass index is 45.37 kg/m.  GENERAL: vitals reviewed and listed above, alert, oriented, appears well hydrated and in no acute distress HEENT: atraumatic, conjunctiva  clear, no obvious abnormalities on inspection of external nose and ears  NECK: no obvious masses on inspection palpation  LUNGS: clear to auscultation bilaterally, no wheezes, rales or rhonchi, CV: HRRR, no clubbing cyanosis or  peripheral edema nl cap refill  Ext gu  No lesion   Area  A 3 mm yellow cystic  Non tender lesion  r labia no adenopathy lesion redness  MS: moves all extremities without noticeable focal  abnormality PSYCH: pleasant and cooperative, no obvious depression or anxiety  Increase motor activity  Legs etc  No tremor Lab Results  Component Value Date   WBC 9.4 03/07/2016   HGB 11.6 (L) 03/07/2016   HCT 37.9 03/07/2016   PLT 357 03/07/2016   GLUCOSE 138 (H) 03/07/2016   CHOL 171 04/20/2015   TRIG 56.0 04/20/2015   HDL 59.40 04/20/2015   LDLCALC 100 (H) 04/20/2015   ALT 43 05/31/2015   AST 29 05/31/2015   NA 139 03/07/2016   K 3.5 03/07/2016   CL 104 03/07/2016   CREATININE 0.97 03/07/2016   BUN 11 03/07/2016   CO2 22 03/07/2016   TSH 3.18 04/20/2015   HGBA1C 6.1 06/26/2016   Wt Readings from Last 3 Encounters:  06/26/16 294 lb (133.4 kg)  06/12/16 291 lb (132 kg)  05/15/16 287 lb (130.2 kg)    ASSESSMENT AND PLAN:  Discussed the following assessment and plan:  Attention deficit hyperactivity disorder (ADHD), unspecified ADHD type - reasonable effect  on for long time mom sees difference when not takingcont for now consider reeval future visits when otherconditions stable  Morbid obesity, unspecified obesity type ([place])  Insulin resistance - a1c stable  on prednisone  could be better fu with endo and  - Plan: POCT A1C  Need for prophylactic vaccination and inoculation against  influenza - Plan: Flu Vaccine QUAD 36+ mos PF IM (Fluarix & Fluzone Quad PF)  Medication management  Labial cyst - reassurance   Moderate intermittent asthma, uncomplicated  Pruritus - responds to pred  consider allergy eval ? if  gets hives as underlying  -Patient advised to return or notify  health care team  if symptoms worsen ,persist or new concerns arise.  Patient Instructions  Continue same med at this time   I think the bump is a cyst and not to worry  But can recheck if getting bigger.   consdieration of seeing allergiest about the itching  Etc.  Blood sugar needs to be rechecked and want you to see endodrin   Being on the prednisone can increase blood sugar  .   Plan ROV in 3 months to see how  everything is doing     Standley Brooking. Tajai Suder M.D.

## 2016-06-26 ENCOUNTER — Ambulatory Visit (INDEPENDENT_AMBULATORY_CARE_PROVIDER_SITE_OTHER): Payer: Federal, State, Local not specified - PPO | Admitting: Internal Medicine

## 2016-06-26 ENCOUNTER — Encounter: Payer: Self-pay | Admitting: Internal Medicine

## 2016-06-26 VITALS — BP 118/70 | HR 74 | Temp 98.0°F | Wt 294.0 lb

## 2016-06-26 DIAGNOSIS — J4520 Mild intermittent asthma, uncomplicated: Secondary | ICD-10-CM

## 2016-06-26 DIAGNOSIS — N907 Vulvar cyst: Secondary | ICD-10-CM

## 2016-06-26 DIAGNOSIS — Z23 Encounter for immunization: Secondary | ICD-10-CM

## 2016-06-26 DIAGNOSIS — Z79899 Other long term (current) drug therapy: Secondary | ICD-10-CM

## 2016-06-26 DIAGNOSIS — E8881 Metabolic syndrome: Secondary | ICD-10-CM

## 2016-06-26 DIAGNOSIS — F909 Attention-deficit hyperactivity disorder, unspecified type: Secondary | ICD-10-CM | POA: Diagnosis not present

## 2016-06-26 DIAGNOSIS — IMO0001 Reserved for inherently not codable concepts without codable children: Secondary | ICD-10-CM

## 2016-06-26 DIAGNOSIS — L299 Pruritus, unspecified: Secondary | ICD-10-CM

## 2016-06-26 LAB — POCT GLYCOSYLATED HEMOGLOBIN (HGB A1C): Hemoglobin A1C: 6.1

## 2016-06-26 MED ORDER — METHYLPHENIDATE HCL ER (OSM) 36 MG PO TBCR: 72.0000 mg | EXTENDED_RELEASE_TABLET | Freq: Every day | ORAL | 0 refills | Status: DC

## 2016-06-26 NOTE — Patient Instructions (Signed)
Continue same med at this time   I think the bump is a cyst and not to worry  But can recheck if getting bigger.   consdieration of seeing allergiest about the itching  Etc.  Blood sugar needs to be rechecked and want you to see endodrin   Being on the prednisone can increase blood sugar  .   Plan ROV in 3 months to see how  everything is doing

## 2016-07-10 ENCOUNTER — Institutional Professional Consult (permissible substitution): Payer: Federal, State, Local not specified - PPO | Admitting: Pulmonary Disease

## 2016-07-11 ENCOUNTER — Encounter: Payer: Self-pay | Admitting: Adult Health

## 2016-07-11 ENCOUNTER — Ambulatory Visit: Payer: Federal, State, Local not specified - PPO | Admitting: Adult Health

## 2016-07-11 ENCOUNTER — Ambulatory Visit (INDEPENDENT_AMBULATORY_CARE_PROVIDER_SITE_OTHER): Payer: Federal, State, Local not specified - PPO | Admitting: Adult Health

## 2016-07-11 VITALS — BP 122/84 | HR 107 | Temp 98.3°F | Ht 67.5 in | Wt 292.2 lb

## 2016-07-11 DIAGNOSIS — J4520 Mild intermittent asthma, uncomplicated: Secondary | ICD-10-CM | POA: Diagnosis not present

## 2016-07-11 DIAGNOSIS — IMO0001 Reserved for inherently not codable concepts without codable children: Secondary | ICD-10-CM

## 2016-07-11 DIAGNOSIS — L309 Dermatitis, unspecified: Secondary | ICD-10-CM

## 2016-07-11 DIAGNOSIS — J45909 Unspecified asthma, uncomplicated: Secondary | ICD-10-CM | POA: Diagnosis not present

## 2016-07-11 LAB — NITRIC OXIDE: Nitric Oxide: 8

## 2016-07-11 MED ORDER — HYDROXYZINE HCL 10 MG PO TABS: 10.0000 mg | ORAL_TABLET | Freq: Every day | ORAL | 0 refills | Status: DC | PRN

## 2016-07-11 NOTE — Assessment & Plan Note (Signed)
Patient Instructions  Continue on Advair 2 puffs Twice daily  , rinse after use.  Continue on Zyrtec 10mg  At bedtime   Continue on Flonase daily  Continue with follow up with dermatology .  Hydroxyzine 10mg  daily  As needed  For itching , use caution as may make you sleepy.  Follow up with Dr. Chase Caller in 3 months and As needed

## 2016-07-11 NOTE — Progress Notes (Signed)
Subjective:    Patient ID: Glenda Mendez, female    DOB: 04-Jul-1991, 25 y.o.   MRN: [id number]  HPI 25 yo female followed for Asthma  Followed at [facility]   TEST [name]  This was 6 2001: Upper endoscopy biopsy showed gastroesophageal reflux  10/07/2003 as a child she had sleep study which is reported as normal at [facility]  12/30/2007 she had CT neck and CT brain: She showed no masses or lymphadenopathy in the neck but had long segment high-grade narrowing of the right common carotid with reconstitution of the bifurcation. But normal range  01/23/2014 pulmonary function test at [facility] 4.09L//97%, FEV1 2.44 L/95% ratio of 87. Total lung capacity and DLCO not available. Off note she also had normal spirometry and total lung capacity in 2014 and 2013  07/16/2015: Blood allergy panel IgE was negative including Aspergillus fumigatus. She had eosinophils of 3.1% with absolute eosinophil count of 260. Review of her use of the count in our chart shows eosinophils just over 200 cells per cubic millimeter for many years. She had a blood IgE of 132 [in 2005 it was normal in the 30s] which is within normal range at [facility]  PFT 05/12/2016 showed an FEV1 of 109%, ratio 88, FVC 107%, no significant bronchodilator response, DLCO 71% 05/14/2016 2D echo was normal with an EF of 55-65% High resolution CT chest 05/12/2016 showed no interstitial lung disease, mild air trapping, no acute process Cardiopulmonary exercise test 05/19/2016 showed functional impairment, no clear cardiopulmonary limitation. Clearly limited by body habitus.  FeNO 06/12/16 >6   07/11/2016 Follow-up:  Atypical asthma  Pt returns for 1 month follow up . She has undergone workup for dyspnea and recurrent asthma flares.  She was on slow steorid taper and is now off. No flare of cough or wheeizng.  CT chest, 2 D echo and PFT were essentially normal , nothing to explain dyspnea or asthma symptoms  CPST  showed possible obesity r/t dyspnea. FeNo have been normal. Tody FeNo is 8 (off steroids) .  Being seen by dermatology for eczema.  Denies cough or wheezing  Does complain of itchy skin. Has eczema. Says lately it is very itchy. Previously on hydroxizine to help but ran out.  She denies any hemoptysis, chest pain, orthopnea, PND, or increased leg swelling.    Past Medical History:  Diagnosis Date  . ADHD (attention deficit hyperactivity disorder)   . Allergy   . Asthma   . BMI (body mass index), pediatric, 95-99% for age 63/10/2010   Is starting to gain weight again and advised and counseled today about reduction for health risk reasons. Patient is very well aware of how to do lifestyle intervention she has done this before.   Marland Kitchen Hearing loss in right ear    with hearing aid bilateral   . Hemiparesis ([place])    rt from neonatal period  . Hypoxia of newborn   . Irregular periods 06/04/2011  . PCOS (polycystic ovarian syndrome)   . Pulmonary hemorrhage of fetus or newborn    71 week C-section [facility] of with birth hypoxia   Current Outpatient Prescriptions on File Prior to Visit  Medication Sig Dispense Refill  . albuterol (PROVENTIL HFA;VENTOLIN HFA) 108 (90 BASE) MCG/ACT inhaler Inhale 2 puffs into the lungs every 6 (six) hours as needed for wheezing or shortness of breath. 1 Inhaler 4  . albuterol (PROVENTIL) (2.5 MG/3ML) 0.083% nebulizer solution Take 3 mLs (2.5 mg total) by nebulization every  4 (four) hours as needed for wheezing or shortness of breath. 75 mL 2  . fluticasone (FLONASE) 50 MCG/ACT nasal spray Place 2 sprays into both nostrils daily. 16 g 2  . fluticasone-salmeterol (ADVAIR HFA) 230-21 MCG/ACT inhaler Inhale 2 puffs into the lungs 2 (two) times daily.    Marland Kitchen ipratropium (ATROVENT) 0.02 % nebulizer solution USE 1 VIAL BY NEBULIZATION 3 (THREE) TIMES DAILY AS NEEDED FOR WHEEZING. 62.5 mL 3  . metFORMIN (GLUCOPHAGE) 500 MG tablet Take 2 tablets (1,000 mg total) by mouth 2 (two)  times daily with a meal. 120 tablet 5  . methylphenidate 36 MG PO CR tablet Take 2 tablets (72 mg total) by mouth daily. 60 tablet 0  . norgestimate-ethinyl estradiol (ORTHO-CYCLEN, 28,) 0.25-35 MG-MCG tablet Take 1 tablet by mouth daily. 1 Package 5  . omeprazole (PRILOSEC) 20 MG capsule Take 1 capsule (20 mg total) by mouth 2 (two) times daily before a meal. 60 capsule 3  . triamcinolone cream (KENALOG) 0.5 % Apply topically. Use as directed    . predniSONE (DELTASONE) 10 MG tablet Take 1 tablet daily  0  . tiotropium (SPIRIVA) 18 MCG inhalation capsule Place 18 mcg into inhaler and inhale daily.    . [DISCONTINUED] montelukast (SINGULAIR) 10 MG tablet Take 1 tablet (10 mg total) by mouth at bedtime. (Patient not taking: Reported on 03/07/2016) 90 tablet 3  . [DISCONTINUED] norethindrone-ethinyl estradiol (JUNEL FE,GILDESS FE,LOESTRIN FE) 1-20 MG-MCG tablet Take 1 tablet by mouth daily. (Patient not taking: Reported on 03/07/2016) 1 Package 11   No current facility-administered medications on file prior to visit.       Review of Systems Constitutional:   No  weight loss, night sweats,  Fevers, chills, fatigue, or  lassitude.  HEENT:   No headaches,  Difficulty swallowing,  Tooth/dental problems, or  Sore throat,                No sneezing, itching, ear ache,  +nasal congestion, post nasal drip,   CV:  No chest pain,  Orthopnea, PND, swelling in lower extremities, anasarca, dizziness, palpitations, syncope.   GI  No heartburn, indigestion, abdominal pain, nausea, vomiting, diarrhea, change in bowel habits, loss of appetite, bloody stools.   Resp: No shortness of breath with exertion or at rest.  No excess mucus, no productive cough,  No non-productive cough,  No coughing up of blood.  No change in color of mucus.  No wheezing.  No chest wall deformity  Skin: itchy skin ++  GU: no dysuria, change in color of urine, no urgency or frequency.  No flank pain, no hematuria   MS:  No joint  pain or swelling.  No decreased range of motion.  No back pain.  Psych:  No change in mood or affect. No depression or anxiety.  No memory loss.     '    Objective:   Physical Exam Vitals:   07/11/16 1213  BP: 122/84  Pulse: (!) 107  Temp: 98.3 F (36.8 C)  TempSrc: Oral  SpO2: 100%  Weight: 292 lb 3.2 oz (132.5 kg)  Height: 5' 7.5" (1.715 m)  Body mass index is 45.09 kg/m.  GEN: A/Ox3; pleasant , NAD, obese    HEENT:  [place]/AT,  EACs-clear, TMs-wnl, NOSE-clear, THROAT-clear, no lesions, no postnasal drip or exudate noted.   NECK:  Supple w/ fair ROM; no JVD; normal carotid impulses w/o bruits; no thyromegaly or nodules palpated; no lymphadenopathy.    RESP  Clear  P &  A; w/o, wheezes/ rales/ or rhonchi. no accessory muscle use, no dullness to percussion  CARD:  RRR, no m/r/g  , no peripheral edema, pulses intact, no cyanosis or clubbing.  GI:   Soft & nt; nml bowel sounds; no organomegaly or masses detected.   Musco: Warm bil, no deformities or joint swelling noted.   Neuro: alert, no focal deficits noted.    Skin: Warm, patches along face and arms. ? exzematous    Tammy Parrett NP-C  [facility]  07/11/2016        Assessment & Plan:

## 2016-07-11 NOTE — Patient Instructions (Addendum)
Continue on Advair 2 puffs Twice daily  , rinse after use.  Continue on Zyrtec 10mg  At bedtime   Continue on Flonase daily  Continue with follow up with dermatology .  Hydroxyzine 10mg  daily  As needed  For itching , use caution as may make you sleepy.  Follow up with Dr. Chase Caller in 3 months and As needed

## 2016-08-11 ENCOUNTER — Telehealth: Payer: Self-pay | Admitting: Internal Medicine

## 2016-08-11 NOTE — Telephone Encounter (Signed)
His medication was prescribed by the pulmonary division  Please direct refill requested  To them.

## 2016-08-11 NOTE — Telephone Encounter (Signed)
Pt needs a refill on hydroxyzine send to [facility]

## 2016-08-11 NOTE — Telephone Encounter (Signed)
Has not been filled by [facility].  Will forward to [facility] for authorization.

## 2016-08-12 ENCOUNTER — Ambulatory Visit (INDEPENDENT_AMBULATORY_CARE_PROVIDER_SITE_OTHER): Payer: Federal, State, Local not specified - PPO | Admitting: Pulmonary Disease

## 2016-08-12 ENCOUNTER — Encounter: Payer: Self-pay | Admitting: Pulmonary Disease

## 2016-08-12 DIAGNOSIS — R0682 Tachypnea, not elsewhere classified: Secondary | ICD-10-CM | POA: Diagnosis not present

## 2016-08-12 DIAGNOSIS — L309 Dermatitis, unspecified: Secondary | ICD-10-CM

## 2016-08-12 DIAGNOSIS — J4541 Moderate persistent asthma with (acute) exacerbation: Secondary | ICD-10-CM

## 2016-08-12 MED ORDER — PREDNISONE 10 MG PO TABS: ORAL_TABLET | ORAL | 0 refills | Status: DC

## 2016-08-12 MED ORDER — HYDROXYZINE HCL 10 MG PO TABS: 10.0000 mg | ORAL_TABLET | Freq: Every day | ORAL | 0 refills | Status: DC | PRN

## 2016-08-12 NOTE — Patient Instructions (Signed)
Prednisone 10 mg tabs -Take 3 tabs  daily with food , then 2 tabs daily x  1 day, then 1 tab daily x1 days then stop. #20  Refill on atarax 1

## 2016-08-12 NOTE — Assessment & Plan Note (Signed)
Refill on atarax 1

## 2016-08-12 NOTE — Assessment & Plan Note (Addendum)
Exacerbation seems to have been related to weather change. Bronchospasm was noted by EMS and by mom. Would rapidly taper prednisone being mindful of side effects of steroids. No other trigger identified  Prednisone 10 mg tabs -Take 3 tabs  daily with food , then 2 tabs daily x  1 day, then 1 tab daily x1 days then stop. #20  Would benefit from Singulair She appears to be compliant with Advair, has used in the past We'll give her more steroid tablets so that she can stick to action plan laid out by [facility] for prednisone self administration

## 2016-08-12 NOTE — Progress Notes (Signed)
   Subjective:    Patient ID: Glenda Mendez, female    DOB: 08-20-1991, 25 y.o.   MRN: [id number]  HPI  Chief Complaint  Patient presents with  . Acute Visit    MR pt. pt states she had an asthma attack this morning, EMS was called. pt states breathing improved with 2 neb treatment . pt c/o sob, non prod cough, wheezing & chest tightness.   25 year old obese woman with asthma since childhood. 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 She is maintained on a regimen of Advair, Spiriva was taken off, and Flonase for allergies. She used to follow up with [facility] and had a detailed evaluation. Lung function has been noted to be normal. There has been some concern for vocal cord dysfunction. Sleep studies have been reported normal.  She is adopted. Her mom reports triggers being changes in weather in barometric pressure. She developed sudden onset chest tightness and wheezing around 11 AM-took nebulizer treatment every 3 hours 2, but wheezing is persistent then EMS was called, she was given 2 more nebulizer treatments with improvement-mother also given 40 mg of prednisone based on action plan laid out by [facility]  She feels much improved and is back to normal. Mother feels that she has nasal congestion  She also has lifelong eczema and itching and requests a refill on Vistaril  Review of Systems neg for any significant sore throat, dysphagia, itching, sneezing, nasal congestion or excess/ purulent secretions, fever, chills, sweats, unintended wt loss, pleuritic or exertional cp, hempoptysis, orthopnea pnd or change in chronic leg swelling. Also denies presyncope, palpitations, heartburn, abdominal pain, nausea, vomiting, diarrhea or change in bowel or urinary habits, dysuria,hematuria, rash, arthralgias, visual complaints, headache, numbness weakness or ataxia.     Objective:   Physical Exam   Gen. Pleasant, obese, in no distress ENT -  no lesions, no post nasal drip Neck: No JVD, no thyromegaly, no carotid bruits Lungs: no use of accessory muscles, no dullness to percussion, decreased without rales or rhonchi  Cardiovascular: Rhythm regular, heart sounds  normal, no murmurs or gallops, no peripheral edema Musculoskeletal: No deformities, no cyanosis or clubbing , no tremors        Assessment & Plan:

## 2016-08-15 DIAGNOSIS — J45909 Unspecified asthma, uncomplicated: Secondary | ICD-10-CM | POA: Diagnosis not present

## 2016-08-15 DIAGNOSIS — S86911A Strain of unspecified muscle(s) and tendon(s) at lower leg level, right leg, initial encounter: Secondary | ICD-10-CM | POA: Diagnosis not present

## 2016-08-15 DIAGNOSIS — M79604 Pain in right leg: Secondary | ICD-10-CM | POA: Diagnosis not present

## 2016-09-05 ENCOUNTER — Other Ambulatory Visit: Payer: Self-pay | Admitting: Internal Medicine

## 2016-09-05 MED ORDER — METHYLPHENIDATE HCL ER (OSM) 36 MG PO TBCR: EXTENDED_RELEASE_TABLET | ORAL | 0 refills | Status: DC

## 2016-09-05 NOTE — Telephone Encounter (Signed)
Ok for 3 months refill

## 2016-09-25 ENCOUNTER — Ambulatory Visit: Payer: Federal, State, Local not specified - PPO | Admitting: Internal Medicine

## 2016-09-26 ENCOUNTER — Ambulatory Visit (INDEPENDENT_AMBULATORY_CARE_PROVIDER_SITE_OTHER): Payer: Federal, State, Local not specified - PPO | Admitting: Internal Medicine

## 2016-09-26 ENCOUNTER — Encounter: Payer: Self-pay | Admitting: Internal Medicine

## 2016-09-26 VITALS — BP 134/72 | HR 107 | Ht 67.5 in | Wt 300.0 lb

## 2016-09-26 DIAGNOSIS — J4520 Mild intermittent asthma, uncomplicated: Secondary | ICD-10-CM | POA: Diagnosis not present

## 2016-09-26 DIAGNOSIS — J00 Acute nasopharyngitis [common cold]: Secondary | ICD-10-CM

## 2016-09-26 DIAGNOSIS — IMO0001 Reserved for inherently not codable concepts without codable children: Secondary | ICD-10-CM

## 2016-09-26 MED ORDER — CEPHALEXIN 500 MG PO CAPS: 500.0000 mg | ORAL_CAPSULE | Freq: Three times a day (TID) | ORAL | 0 refills | Status: DC

## 2016-09-26 NOTE — Progress Notes (Signed)
Subjective:     Patient ID: Glenda Mendez, female   DOB: 06/07/1991, 25 y.o.   MRN: [id number]  HPI    HPI  Chief Complaint  Patient presents with  . Acute Visit    MR pt. pt states she had an asthma attack this morning, EMS was called. pt states breathing improved with 2 neb treatment . pt c/o sob, non prod cough, wheezing & chest tightness.   25 year old obese woman with asthma since childhood.  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 She is maintained on a regimen of Advair, Spiriva was taken off, and Flonase for allergies. She used to follow up with [facility] and had a detailed evaluation. Lung function has been noted to be normal. There has been some concern for vocal cord dysfunction. Sleep studies have been reported normal.  She is adopted. Her mom reports triggers being changes in weather in barometric pressure. She developed sudden onset chest tightness and wheezing around 11 AM-took nebulizer treatment every 3 hours 2, but wheezing is persistent then EMS was called, she was given 2 more nebulizer treatments with improvement-mother also given 40 mg of prednisone based on action plan laid out by [facility]  She feels much improved and is back to normal. Mother feels that she has nasal congestion  She also has lifelong eczema and itching and requests a refill on Vistaril   OV 09/26/2016  Chief Complaint  Patient presents with  . Follow-up    Pt c/o increase in SOB, sinus congestion and blowing out yellow mucus with blood and c/o sore throat. Pt deneis CP/tightness, f/c/s.   IOV 05/08/2016  Chief Complaint  Patient presents with  . Pulnonary Consult    Ref. by Dr. Rudi Coco since 1993. Since 2015-multiple er visits and ems with increased sob and chest tightness.Blue color at times during these episodes.On Prednisone (pulmonary md at [facility]-Dr. Everlean Alstrom md left) on 5 wks. now10 mg daily.no wheezing.   25 year old  obese F American female brought in by her adopted mother. Patient is to be followed at [facility] for asthma. But the doctor there has left. Therefore she is new consultation here and transfer of care. 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 However in her entire life EMS was never called for asthma but in May 2015 in May 2016 this happened. Then in 2016 though multiple episodes of requiring prednisone at least 4 times. Then in the fall of 2016 at [facility] they did blood allergy panel, IgE and complete blood count this was normal except for years and feels that was slightly elevated at 100 cells per cubic millimeter. Patient also mother does not recollect having had an exhaled nitric oxide. At this point in time she was only on Spiriva and Advair. They reported that at that point in time. Tried Singulair for many years but it was not working so there will not taking it. It appears from talking to the family and review of the chart Nucala was considered and patient paid out of pocket for it and took it for 3 months in the early part of 2017 and felt significant relief. However by March 2017 she did not have money to for this out of pocket. Then starting May 2017 through June 2017 multiple EMS calls and requiring prednisone repeatedly that one month. Then in June  2017 Dr. Deeann Dowse started chronic daily prednisone till she saw me this visit. Patient's currently taking 10 mg per day. According to the patient on the mom this is helped her significantly and she is feeling much improved on the combination of prednisone Spiriva and Advair. Nevertheless asthma control questionnaire shows a score of 2 suggesting significant active symptoms. At night she hardly ever wakes up because of asthma. When she wakes up she has moderate symptoms. She is limited extremely with her daily activities  because of asthma and she has a little shortness of breath she is not wheezing or using albuterol for rescue this past week.  Of note only function tests is always been normal at [facility]. Blood allergy panel has been normal IgE has been normal complete blood count has been normal.  This no history of CT chest with CT sinus a sleep apnea workup in the last few years. Patient does not recollect having had an echocardiogram.       Asthma related data  This was 6 2001: Upper endoscopy biopsy showed gastroesophageal reflux  10/07/2003 as a child she had sleep study which is reported as normal at [facility]  12/30/2007 she had CT neck and CT brain: She showed no masses or lymphadenopathy in the neck but had long segment high-grade narrowing of the right common carotid with reconstitution of the bifurcation. But normal range  01/23/2014 pulmonary function test at [facility] 4.09L//97%, FEV1 2.44 L/95% ratio of 87. Total lung capacity and DLCO not available. Off note she also had normal spirometry and total lung capacity in 2014 and 2013  07/16/2015: Blood allergy panel IgE was negative including Aspergillus fumigatus. She had eosinophils of 3.1% with absolute eosinophil count of 260. Review of her use of the count in our chart shows eosinophils just over 200 cells per cubic millimeter for many years. She had a blood IgE of 132 [in 2005 it was normal in the 30s] which is within normal range at [facility]   has a past medical history of Asthma; Pulmonary hemorrhage of fetus or newborn; Hypoxia of newborn; Hearing loss in right ear; Hemiparesis ([place]); Allergy; ADHD (attention deficit hyperactivity disorder); Irregular periods (06/04/2011); BMI (body mass index), pediatric, 95-99% for age (02/18/2011); and PCOS (polycystic ovarian syndrome).Let Glenda Mendez 1991-02-03  Know that CT sinus and CT chest and PFT all normal   OV 05/15/2016  Chief Complaint  Patient presents  with  . Follow-up    review echo, ct, and pft.  pt has no change in breathing since last OV.       Ct Chest High Resolution  Result Date: 05/13/2016 CLINICAL DATA:  25 year old female with history of chronic shortness of breath. Evaluate for potential interstitial lung disease. EXAM: CT CHEST WITHOUT CONTRAST TECHNIQUE: Multidetector CT imaging of the chest was performed following the standard protocol without intravenous contrast. High resolution imaging of the lungs, as well as inspiratory and expiratory imaging, was performed. COMPARISON:  No priors. FINDINGS: Cardiovascular: Heart size is normal. There is no significant pericardial fluid, thickening or pericardial calcification. No atherosclerotic calcifications are noted in the thoracic aorta or the coronary arteries. Mediastinum/Nodes: No pathologically enlarged mediastinal or hilar lymph nodes. Please note that accurate exclusion of hilar adenopathy is limited on noncontrast CT scans. Esophagus is unremarkable in appearance. No axillary lymphadenopathy. Lungs/Pleura: High-resolution images demonstrate no significant regions of ground-glass attenuation, subpleural reticulation, parenchymal banding, traction bronchiectasis or frank honeycombing to indicate interstitial lung disease. Inspiratory and expiratory  imaging demonstrates some mild air trapping, indicative of mild small airways disease. No acute consolidative airspace disease. No pleural effusions. No suspicious appearing pulmonary nodules or masses. Upper Abdomen: Unremarkable. Musculoskeletal: There are no aggressive appearing lytic or blastic lesions noted in the visualized portions of the skeleton. IMPRESSION: 1. No findings to suggest interstitial lung disease. 2. Mild air trapping, indicative of mild small airways disease. 3. No acute findings in the thorax. Electronically Signed   By: [name] M.D.   On: 05/13/2016 09:59  Ct Maxillofacial Ltd Wo Cm  Result Date:  05/12/2016 CLINICAL DATA:  Asthma. Chronic sinus infections with interstitial lung disease. EXAM: CT PARANASAL SINUS LIMITED WITHOUT CONTRAST TECHNIQUE: Non-contiguous multidetector CT images of the paranasal sinuses were obtained in a single plane without contrast. COMPARISON:  None. FINDINGS: The paranasal sinuses are well aerated. There is no mucoperiosteal thickening or air-fluid levels. The visualized mastoid air cells and middle ears are clear. No orbital abnormalities are seen. The visualized intracranial contents are unremarkable. IMPRESSION: Negative limited paranasal sinus CT. No evidence of active sinus disease. Electronically Signed   By: [name] M.D.   On: 05/12/2016 19:51    Echocardiogram 05/14/2016: Normal   OV 06/12/2016  Chief Complaint  Patient presents with  . Follow-up    Pt here after CPST. Pt denies change in SOB and cough. Pt states she has dry skin under her left eye.    Follow-up history of asthma with dyspnea. Here to review cardiopulmonary stress test results. This was done 05/19/2016 and I reviewed it. Obesity and diastolic dysfunction are major cause of dyspnea. Exercise induced bronchospasm response was borderline while she was on Advair and 5 mg daily prednisone. However today she is in stable health and exhaled nitric oxide while on the same Advair and prednisone is:  [place] - 6ppb. She and her stepmom somewhat reluctant to accept the findings of obesity and diastolic dysfunction. Mom's concern is why patient exacerbates every May 2017. But she is agreed to follow this. They're in agreement that prednisone needs to be tapered off as soon as possible. She was started on long-term prednisone in May 2017.   OV 09/26/2016  Chief Complaint  Patient presents with  . Follow-up    Pt c/o increase in SOB, sinus congestion and blowing out yellow mucus with blood and c/o sore throat. Pt deneis CP/tightness, f/c/s.      Follow-up chronic dyspnea due to obesity and  diastolic dysfunction. Has a history of asthma but is consistently had normal exhaled nitric oxide  Been able to get her off daily prednisone he did she is tolerating that fine. She's currently on Advair and Spiriva. She says that asthma is under control. No interim albuterol use. No nocturnal awakenings. No wheeze. Albuterol use is rare. For the last day or so she's had runny nose with sinus congestion with yellow discharge. No fever but has mild malaise   Ast herehma Control Panel 05/08/2016  06/12/16 07/11/16   Spiriva, Advair, daily prednisone since June   Spiriva and Advair   ACQ 5 point- 1 week. wtd avg score. <1.0 is good control 0.75-1.25 is grey zone. >1.25 poor control. Delta 0.5 is clinically meaningful 2    ACQ 7 point - 1 week. wtd avg score. <1.0 is good control 0.75-1.25 is grey zone. >1.25 poor control. Delta 0.5 is clinically meaningful x    ACT - a [employer] test - 4 week. Total score. Max is 25, Lower score is worse.  <  19 = poor control x    FeNO ppB 7ppbDone while on chronic steroids and normal  6ppb 8  FeV1      Planned intervention  for visit        has a past medical history of ADHD (attention deficit hyperactivity disorder); Allergy; Asthma; BMI (body mass index), pediatric, 95-99% for age (02/18/2011); Hearing loss in right ear; Hemiparesis ([place]); Hypoxia of newborn; Irregular periods (06/04/2011); PCOS (polycystic ovarian syndrome); and Pulmonary hemorrhage of fetus or newborn.   reports that she has never smoked. She has never used smokeless tobacco.  Past Surgical History:  Procedure Laterality Date  . TONSILLECTOMY      No Known Allergies  Immunization History  Administered Date(s) Administered  . H1N1 11/03/2008  . Hepatitis A 11/26/2007, 11/20/2009  . Hpv 10/28/2006, 11/26/2007, 11/20/2009  . Influenza Split 07/29/2011, 07/01/2012  . Influenza Whole 08/31/2007, 08/02/2008, 06/18/2010  . Influenza,inj,Quad PF,36+ Mos 08/05/2013, 06/20/2014, 06/12/2015, 06/26/2016   . Meningococcal Polysaccharide 11/26/2007  . Varicella 11/26/2007    Family History  Problem Relation Age of Onset  . Adopted: Yes  . Hypertension    . Schizophrenia Mother     with alcohol drug abuse  . Diabetes Other      Current Outpatient Prescriptions:  .  albuterol (PROVENTIL HFA;VENTOLIN HFA) 108 (90 BASE) MCG/ACT inhaler, Inhale 2 puffs into the lungs every 6 (six) hours as needed for wheezing or shortness of breath., Disp: 1 Inhaler, Rfl: 4 .  albuterol (PROVENTIL) (2.5 MG/3ML) 0.083% nebulizer solution, Take 3 mLs (2.5 mg total) by nebulization every 4 (four) hours as needed for wheezing or shortness of breath., Disp: 75 mL, Rfl: 2 .  fluticasone (FLONASE) 50 MCG/ACT nasal spray, Place 2 sprays into both nostrils daily., Disp: 16 g, Rfl: 2 .  fluticasone-salmeterol (ADVAIR HFA) 230-21 MCG/ACT inhaler, Inhale 2 puffs into the lungs 2 (two) times daily., Disp: , Rfl:  .  hydrOXYzine (ATARAX/VISTARIL) 10 MG tablet, Take 1 tablet (10 mg total) by mouth daily as needed for itching., Disp: 30 tablet, Rfl: 0 .  ipratropium (ATROVENT) 0.02 % nebulizer solution, USE 1 VIAL BY NEBULIZATION 3 (THREE) TIMES DAILY AS NEEDED FOR WHEEZING., Disp: 62.5 mL, Rfl: 3 .  metFORMIN (GLUCOPHAGE) 500 MG tablet, Take 2 tablets (1,000 mg total) by mouth 2 (two) times daily with a meal., Disp: 120 tablet, Rfl: 5 .  METHYLPHENIDATE 36 MG PO CR tablet, TAKE (2) TABLETS DAILY., Disp: 60 tablet, Rfl: 0 .  norgestimate-ethinyl estradiol (ORTHO-CYCLEN, 28,) 0.25-35 MG-MCG tablet, Take 1 tablet by mouth daily., Disp: 1 Package, Rfl: 5 .  omeprazole (PRILOSEC) 20 MG capsule, Take 1 capsule (20 mg total) by mouth 2 (two) times daily before a meal., Disp: 60 capsule, Rfl: 3 .  tiotropium (SPIRIVA) 18 MCG inhalation capsule, Place 18 mcg into inhaler and inhale daily., Disp: , Rfl:  .  triamcinolone cream (KENALOG) 0.5 %, Apply topically. Use as directed, Disp: , Rfl:    Review of Systems     Objective:    Physical Exam  Constitutional: She is oriented to person, place, and time. She appears well-developed and well-nourished. No distress.  HENT:  Head: Normocephalic and atraumatic.  Right Ear: External ear normal.  Left Ear: External ear normal.  Mouth/Throat: Oropharynx is clear and moist. No oropharyngeal exudate.  Mild post nasal drip +  Eyes: Conjunctivae and EOM are normal. Pupils are equal, round, and reactive to light. Right eye exhibits no discharge. Left eye exhibits no discharge. No scleral  icterus.  Neck: Normal range of motion. Neck supple. No JVD present. No tracheal deviation present. No thyromegaly present.  Cardiovascular: Normal rate, regular rhythm, normal heart sounds and intact distal pulses.  Exam reveals no gallop and no friction rub.   No murmur heard. Pulmonary/Chest: Effort normal and breath sounds normal. No respiratory distress. She has no wheezes. She has no rales. She exhibits no tenderness.  Abdominal: Soft. Bowel sounds are normal. She exhibits no distension and no mass. There is no tenderness. There is no rebound and no guarding.  Musculoskeletal: Normal range of motion. She exhibits no edema or tenderness.  Lymphadenopathy:    She has no cervical adenopathy.  Neurological: She is alert and oriented to person, place, and time. She has normal reflexes. No cranial nerve deficit. She exhibits normal muscle tone. Coordination normal.  Skin: Skin is warm and dry. No rash noted. She is not diaphoretic. No erythema. No pallor.  Psychiatric: She has a normal mood and affect. Her behavior is normal. Judgment and thought content normal.  Vitals reviewed.    Vitals:   09/26/16 1206  BP: 134/72  Pulse: (!) 107  SpO2: 98%  Weight: 300 lb (136.1 kg)  Height: 5' 7.5" (1.715 m)    Estimated body mass index is 46.29 kg/m as calculated from the following:   Height as of this encounter: 5' 7.5" (1.715 m).   Weight as of this encounter: 300 lb (136.1 kg).       Assessment:       ICD-9-CM ICD-10-CM   1. Acute rhinitis, unspecified type 460 J00   2. Moderate intermittent asthma, uncomplicated 123456 [id number]        Plan:      Cephalexin 500mg  three times daily x 5 days Cotninue flonase, advair, spiriva scheduled Continue albuterol as needed Glad you are off daily prednisone If symptoims worsen call us  Followup: 6 months with Dr Chase Caller  Dr. Brand Males, M.D., [facility].C.P Pulmonary and Critical Care Medicine Staff Physician [facility] Pager: [phone], If no answer or between  15:00h - 7:00h: call [phone]  09/26/2016 12:25 PM

## 2016-09-26 NOTE — Addendum Note (Signed)
Addended by: Collier Salina on: 09/26/2016 12:43 PM   Modules accepted: Orders

## 2016-09-26 NOTE — Patient Instructions (Signed)
ICD-9-CM ICD-10-CM   1. Acute rhinitis, unspecified type 460 J00   2. Moderate intermittent asthma, uncomplicated 123456 [id number]    Cephalexin 500mg  three times daily x 5 days Cotninue flonase, advair, spiriva scheduled Continue albuterol as needed Glad you are off daily prednisone If symptoims worsen call us  Followup: 6 months with Dr Chase Caller

## 2016-10-23 ENCOUNTER — Ambulatory Visit: Payer: Federal, State, Local not specified - PPO | Admitting: Internal Medicine

## 2016-11-03 ENCOUNTER — Ambulatory Visit: Payer: Federal, State, Local not specified - PPO | Admitting: Internal Medicine

## 2016-11-25 ENCOUNTER — Telehealth: Payer: Self-pay | Admitting: Internal Medicine

## 2016-11-25 NOTE — Telephone Encounter (Signed)
Patient Name: Glenda Mendez  DOB: 04-Mar-1991    Initial Comment Caller says, coughing, sore throat, vomiting, for a couple of days, trying to make an appt    Nurse Assessment      Guidelines    Guideline Title Affirmed Question Affirmed Notes       Final Disposition User   FINAL ATTEMPT MADE - no message left Stand[place], RN, [employer]

## 2016-11-26 ENCOUNTER — Ambulatory Visit (INDEPENDENT_AMBULATORY_CARE_PROVIDER_SITE_OTHER): Payer: Federal, State, Local not specified - PPO | Admitting: Internal Medicine

## 2016-11-26 ENCOUNTER — Encounter: Payer: Self-pay | Admitting: Internal Medicine

## 2016-11-26 VITALS — BP 130/70 | HR 100 | Temp 98.2°F | Wt 302.0 lb

## 2016-11-26 DIAGNOSIS — IMO0001 Reserved for inherently not codable concepts without codable children: Secondary | ICD-10-CM

## 2016-11-26 DIAGNOSIS — K219 Gastro-esophageal reflux disease without esophagitis: Secondary | ICD-10-CM | POA: Diagnosis not present

## 2016-11-26 DIAGNOSIS — J22 Unspecified acute lower respiratory infection: Secondary | ICD-10-CM | POA: Diagnosis not present

## 2016-11-26 DIAGNOSIS — J4520 Mild intermittent asthma, uncomplicated: Secondary | ICD-10-CM

## 2016-11-26 MED ORDER — AMOXICILLIN 500 MG PO CAPS: 500.0000 mg | ORAL_CAPSULE | Freq: Three times a day (TID) | ORAL | 0 refills | Status: DC

## 2016-11-26 MED ORDER — PANTOPRAZOLE SODIUM 40 MG PO TBEC: 40.0000 mg | DELAYED_RELEASE_TABLET | Freq: Every day | ORAL | 0 refills | Status: DC

## 2016-11-26 NOTE — Telephone Encounter (Signed)
Pt scheduled with Dr Regis Bill 11/26/16. Nothing further needed.

## 2016-11-26 NOTE — Progress Notes (Signed)
Pre visit review using our clinic review tool, if applicable. No additional management support is needed unless otherwise documented below in the visit note.  Chief Complaint  Patient presents with  . Cough    Started Saturday  . Sore Throat  . Headache  . Shortness of Breath  . Emesis    HPI: Glenda Mendez 26 y.o.  sda    Under care for asthma allergic rhinitis   And resp  Issue called team health for appt.  For 1 week she's had a cough with some chest tightness. Using nebulizer with some success. No fever does have stuffy thick phlegm from the nose no hemoptysis. One night used 1 dose prednisone. Yesterday took omeprazole 1 time. She describes this now as when she lays down gets chest tightness nausea increasing cough and then burning in her mid chest. History is some vomiting no diarrhea. Not bad enough for her asthma triggers emergency room. He is due for a follow-up appointment with pulmonary. omeprezole x 1  Only  ROS: See pertinent positives and negatives per HPI. Also having right upper tooth pain worse when she clenches and coughs not when she bends over.  Past Medical History:  Diagnosis Date  . ADHD (attention deficit hyperactivity disorder)   . Allergy   . Asthma   . BMI (body mass index), pediatric, 95-99% for age 78/10/2010   Is starting to gain weight again and advised and counseled today about reduction for health risk reasons. Patient is very well aware of how to do lifestyle intervention she has done this before.   Marland Kitchen Hearing loss in right ear    with hearing aid bilateral   . Hemiparesis ([place])    rt from neonatal period  . Hypoxia of newborn   . Irregular periods 06/04/2011  . PCOS (polycystic ovarian syndrome)   . Pulmonary hemorrhage of fetus or newborn    41 week C-section [facility] of with birth hypoxia    Family History  Problem Relation Age of Onset  . Adopted: Yes  . Hypertension    . Schizophrenia Mother     with alcohol drug abuse  . Diabetes Other       Social History   Social History  . Marital status: Single    Spouse name: N/A  . Number of children: N/A  . Years of education: N/A   Social History Main Topics  . Smoking status: Never Smoker  . Smokeless tobacco: Never Used  . Alcohol use No  . Drug use: No  . Sexual activity: Yes    Birth control/ protection: Pill   Other Topics Concern  . 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 [name]- pulm at [facility] now has a new pulmonary Dr. Now stable and on prn       Thyroid evaluation in the past neck evaluation within normal limits and no thyroid disease.    Outpatient Medications Prior to Visit  Medication Sig Dispense Refill  . albuterol (PROVENTIL HFA;VENTOLIN HFA) 108 (90 BASE) MCG/ACT inhaler Inhale 2 puffs into the lungs every 6 (six) hours as needed for wheezing or shortness of breath. 1 Inhaler 4  . albuterol (PROVENTIL) (2.5 MG/3ML) 0.083% nebulizer solution Take 3 mLs (2.5 mg total) by nebulization every 4 (four)  hours as needed for wheezing or shortness of breath. 75 mL 2  . fluticasone (FLONASE) 50 MCG/ACT nasal spray Place 2 sprays into both nostrils daily. 16 g 2  . fluticasone-salmeterol (ADVAIR HFA) 230-21 MCG/ACT inhaler Inhale 2 puffs into the lungs 2 (two) times daily.    . hydrOXYzine (ATARAX/VISTARIL) 10 MG tablet Take 1 tablet (10 mg total) by mouth daily as needed for itching. 30 tablet 0  . ipratropium (ATROVENT) 0.02 % nebulizer solution USE 1 VIAL BY NEBULIZATION 3 (THREE) TIMES DAILY AS NEEDED FOR WHEEZING. 62.5 mL 3  . metFORMIN (GLUCOPHAGE) 500 MG tablet Take 2 tablets (1,000 mg total) by mouth 2 (two) times daily with a meal. 120 tablet 5  . METHYLPHENIDATE 36 MG PO CR tablet TAKE (2) TABLETS DAILY. 60 tablet 0  . norgestimate-ethinyl estradiol (ORTHO-CYCLEN, 28,) 0.25-35  MG-MCG tablet Take 1 tablet by mouth daily. 1 Package 5  . omeprazole (PRILOSEC) 20 MG capsule Take 1 capsule (20 mg total) by mouth 2 (two) times daily before a meal. 60 capsule 3  . tiotropium (SPIRIVA) 18 MCG inhalation capsule Place 18 mcg into inhaler and inhale daily.    Marland Kitchen triamcinolone cream (KENALOG) 0.5 % Apply topically. Use as directed    . cephALEXin (KEFLEX) 500 MG capsule Take 1 capsule (500 mg total) by mouth 3 (three) times daily. 15 capsule 0   No facility-administered medications prior to visit.      EXAM:  BP 130/70 (BP Location: Right Arm, Patient Position: Sitting, Cuff Size: Large)   Pulse 100   Temp 98.2 F (36.8 C) (Oral)   Wt (!) 302 lb (137 kg)   SpO2 96%   BMI 46.60 kg/m   Body mass index is 46.6 kg/m. WDWN in NAD  quiet respirations;  congested  somewhat hoarse. Non toxic . HEENT: Normocephalic ;atraumatic , Eyes;  PERRL, EOMs  Full, lids and conjunctiva clear,,Ears: no deformities, canals nl, TM landmarks normal, Nose: no deformity or discharge but congested;face minimally tender Mouth : OP clear without lesion or edema .tender rupper molar no obv abscess  Neck: Supple without adenopathy or masses or bruits Chest:  Clear to A without wheezes rales or rhonchi CV:  S1-S2 no gallops or murmurs peripheral perfusion is normal Skin :nl perfusion and no acute rashes    Wt Readings from Last 3 Encounters:  11/26/16 (!) 302 lb (137 kg)  09/26/16 300 lb (136.1 kg)  08/12/16 300 lb (136.1 kg)   BP Readings from Last 3 Encounters:  11/26/16 130/70  09/26/16 134/72  08/12/16 124/68    ASSESSMENT AND PLAN:  Discussed the following assessment and plan:  Acute respiratory infection  Moderate intermittent asthma, uncomplicated  Gastroesophageal reflux disease, esophagitis presence not specified ? - see text  ? nocturnal?adding to resp sx  Morbid obesity, unspecified obesity type (HCC) Nocturnal  Positional   Tight  and cough and then burning .    Suspect aggravated by positional reflux. At this time no signs of pneumonia or serious asthma flare. Continue her asthma medicines change her PPI although would not like to have a long-term use add antibiotic if needed discussed. These make appointment with her pulmonary doctor also. Decision about continuing or weaning the acid blocker.  -Patient advised to return or notify health care team  if symptoms worsen ,persist or new concerns arise.  Patient Instructions  Change the acid bloicking medicine  To   protonix  I think that nocturnal acid reflux may be contributing to some  of your symptoms. You have an acute respiratory infection that could get better on its own but if you're getting increasing infected nasal drainage and pressure you can add an antibiotic. Continue on your nebulizers as appropriate. We'll send a copy to Dr. Chase Caller about advisability of staying on the acid blocker.  Avoid flu exposures  As possible.     Standley Brooking. Jayr Lupercio M.D.

## 2016-11-26 NOTE — Patient Instructions (Addendum)
Change the acid bloicking medicine  To   protonix  I think that nocturnal acid reflux may be contributing to some of your symptoms. You have an acute respiratory infection that could get better on its own but if you're getting increasing infected nasal drainage and pressure you can add an antibiotic. Continue on your nebulizers as appropriate. We'll send a copy to Dr. Chase Caller about advisability of staying on the acid blocker.  Avoid flu exposures  As possible.

## 2017-01-28 ENCOUNTER — Ambulatory Visit (HOSPITAL_COMMUNITY)
Admission: EM | Admit: 2017-01-28 | Discharge: 2017-01-28 | Disposition: A | Discharge status: Home / Self Care | Payer: Federal, State, Local not specified - PPO | Attending: Family Medicine | Admitting: Family Medicine

## 2017-01-28 ENCOUNTER — Encounter (HOSPITAL_COMMUNITY): Payer: Self-pay | Admitting: Emergency Medicine

## 2017-01-28 DIAGNOSIS — R2231 Localized swelling, mass and lump, right upper limb: Secondary | ICD-10-CM

## 2017-01-28 NOTE — ED Provider Notes (Signed)
CSN: [id number]     Arrival date & time 01/28/17  1234 History   First MD Initiated Contact with Patient 01/28/17 1300     Chief Complaint  Patient presents with  . Hand Pain   (Consider location/radiation/quality/duration/timing/severity/associated sxs/prior Treatment) This 26 year old female states that approximate 3 days ago she noticed a small hard knot in the right palm of the hand just proximal to the MCP. Occasionally would get numb and she would have trouble straightening her fingers out but not occurring now. No known injury. No blunt trauma.      Past Medical History:  Diagnosis Date  . ADHD (attention deficit hyperactivity disorder)   . Allergy   . Asthma   . BMI (body mass index), pediatric, 95-99% for age 04/20/2011   Is starting to gain weight again and advised and counseled today about reduction for health risk reasons. Patient is very well aware of how to do lifestyle intervention she has done this before.   Marland Kitchen Hearing loss in right ear    with hearing aid bilateral   . Hemiparesis ([place])    rt from neonatal period  . Hypoxia of newborn   . Irregular periods 06/04/2011  . PCOS (polycystic ovarian syndrome)   . Pulmonary hemorrhage of fetus or newborn    38 week C-section [facility] of with birth hypoxia   Past Surgical History:  Procedure Laterality Date  . TONSILLECTOMY     Family History  Problem Relation Age of Onset  . Adopted: Yes  . Hypertension    . Schizophrenia Mother     with alcohol drug abuse  . Diabetes Other    Social History  Substance Use Topics  . Smoking status: Never Smoker  . Smokeless tobacco: Never Used  . Alcohol use No   OB History    No data available     Review of Systems  Constitutional: Negative.   Musculoskeletal:       As per history of present illness  All other systems reviewed and are negative.   Allergies  Patient has no known allergies.  Home Medications   Prior to Admission medications   Medication Sig Start  Date End Date Taking? Authorizing Provider  albuterol (PROVENTIL HFA;VENTOLIN HFA) 108 (90 BASE) MCG/ACT inhaler Inhale 2 puffs into the lungs every 6 (six) hours as needed for wheezing or shortness of breath. 09/09/13   Burnis Medin, MD  albuterol (PROVENTIL) (2.5 MG/3ML) 0.083% nebulizer solution Take 3 mLs (2.5 mg total) by nebulization every 4 (four) hours as needed for wheezing or shortness of breath. 03/14/16   Burnis Medin, MD  amoxicillin (AMOXIL) 500 MG capsule Take 1 capsule (500 mg total) by mouth 3 (three) times daily. 11/26/16   Burnis Medin, MD  fluticasone (FLONASE) 50 MCG/ACT nasal spray Place 2 sprays into both nostrils daily. 05/08/16   Brand Males, MD  fluticasone-salmeterol (ADVAIR HFA) 230-21 MCG/ACT inhaler Inhale 2 puffs into the lungs 2 (two) times daily.    Historical Provider, MD  hydrOXYzine (ATARAX/VISTARIL) 10 MG tablet Take 1 tablet (10 mg total) by mouth daily as needed for itching. 08/12/16   Rigoberto Noel, MD  ipratropium (ATROVENT) 0.02 % nebulizer solution USE 1 VIAL BY NEBULIZATION 3 (THREE) TIMES DAILY AS NEEDED FOR WHEEZING. 03/20/16   Burnis Medin, MD  metFORMIN (GLUCOPHAGE) 500 MG tablet Take 2 tablets (1,000 mg total) by mouth 2 (two) times daily with a meal. 08/08/15   Philemon Kingdom, MD  METHYLPHENIDATE [phone]  MG PO CR tablet TAKE (2) TABLETS DAILY. 09/05/16   Burnis Medin, MD  norgestimate-ethinyl estradiol (ORTHO-CYCLEN, 28,) 0.25-35 MG-MCG tablet Take 1 tablet by mouth daily. 11/07/15   Burnis Medin, MD  omeprazole (PRILOSEC) 20 MG capsule Take 1 capsule (20 mg total) by mouth 2 (two) times daily before a meal. 06/20/14   Burnis Medin, MD  pantoprazole (PROTONIX) 40 MG tablet Take 1 tablet (40 mg total) by mouth daily. 11/26/16   Burnis Medin, MD  tiotropium (SPIRIVA) 18 MCG inhalation capsule Place 18 mcg into inhaler and inhale daily.    Historical Provider, MD  triamcinolone cream (KENALOG) 0.5 % Apply topically. Use as directed 03/31/16 03/31/17   Historical Provider, MD   Meds Ordered and Administered this Visit  Medications - No data to display  BP (!) 115/58 (BP Location: Right Arm) Comment (BP Location): large cuff  Pulse 92   Temp 98.2 F (36.8 C) (Oral)   Resp (!) 22   LMP 01/20/2017   SpO2 98%  No data found.   Physical Exam  Constitutional: She is oriented to person, place, and time. She appears well-developed and well-nourished.  Neck: Neck supple.  Pulmonary/Chest: Effort normal.  Abdominal: She exhibits mass.  Musculoskeletal:  Subdermal lesion to the palmar aspect of the right hand at the base of the long finger MCP. Palpation reveals a discoidal firm lesion that is also tender. It may move approximately 1-2 mm but no further. No overlying skin changes.  Neurological: She is alert and oriented to person, place, and time.  Skin: Skin is warm and dry.  Psychiatric: She has a normal mood and affect.  Nursing note and vitals reviewed.   Urgent Care Course     Procedures (including critical care time)  Labs Review Labs Reviewed - No data to display  Imaging Review No results found.   Visual Acuity Review  Right Eye Distance:   Left Eye Distance:   Bilateral Distance:    Right Eye Near:   Left Eye Near:    Bilateral Near:         MDM   1. Nodule of palm, right    Wear some sort of condition over the area so he will not injure it by touching or striking it on the something. If it becomes larger, painful, red or worsening in any way, seek medical attention promptly. Otherwise, call the hand surgeon listed above     Janne Napoleon, NP 01/28/17 1322

## 2017-01-28 NOTE — Discharge Instructions (Signed)
Wear some sort of condition over the area so he will not injure it by touching or striking it on the something. If it becomes larger, painful, red or worsening in any way, seek medical attention promptly. Otherwise, call the hand surgeon listed above

## 2017-01-28 NOTE — ED Triage Notes (Signed)
Bump on palm of right hand.  Patient does have a palpable knot at base of right middle finger.  This area is sore

## 2017-02-02 ENCOUNTER — Encounter (INDEPENDENT_AMBULATORY_CARE_PROVIDER_SITE_OTHER): Payer: Self-pay | Admitting: Orthopaedic Surgery

## 2017-02-02 ENCOUNTER — Ambulatory Visit (INDEPENDENT_AMBULATORY_CARE_PROVIDER_SITE_OTHER): Payer: Federal, State, Local not specified - PPO | Admitting: Orthopaedic Surgery

## 2017-02-02 DIAGNOSIS — M67441 Ganglion, right hand: Secondary | ICD-10-CM

## 2017-02-02 DIAGNOSIS — M65331 Trigger finger, right middle finger: Secondary | ICD-10-CM | POA: Diagnosis not present

## 2017-02-02 MED ORDER — METHYLPREDNISOLONE ACETATE 40 MG/ML IJ SUSP
13.3300 mg | INTRAMUSCULAR | Status: AC | PRN
  Administered 2017-02-02: 13.33 mg

## 2017-02-02 MED ORDER — LIDOCAINE HCL 1 % IJ SOLN
0.3000 mL | INTRAMUSCULAR | Status: AC | PRN
  Administered 2017-02-02: .3 mL

## 2017-02-02 MED ORDER — BUPIVACAINE HCL 0.5 % IJ SOLN
0.3300 mL | INTRAMUSCULAR | Status: AC | PRN
  Administered 2017-02-02: .33 mL

## 2017-02-02 NOTE — Progress Notes (Signed)
Office Visit Note   Patient: Glenda Mendez           Date of Birth: 1991/08/06           MRN: [id number] Visit Date: 02/02/2017              Requested by: Burnis Medin, MD [place], [place] [address] PCP: Lottie Dawson, MD   Assessment & Plan: Visit Diagnoses:  1. Trigger finger, right middle finger   2. Ganglion cyst of flexor tendon sheath of finger of right hand     Plan: Trigger finger injection was performed today; patient tolerated this well. Patient understands that there is risk for recurrent and we can consider another injection versus surgical release of the trigger finger and removal of the cyst.  Follow-Up Instructions: Return if symptoms worsen or fail to improve.   Orders:  No orders of the defined types were placed in this encounter.  No orders of the defined types were placed in this encounter.     Procedures: Hand/UE Inj Date/Time: 02/02/2017 10:09 AM Performed by: Leandrew Koyanagi Authorized by: Leandrew Koyanagi   Consent Given by:  Patient Timeout: prior to procedure the correct patient, procedure, and site was verified   Indications:  Pain Condition: trigger finger   Location:  Long finger (see office note for specific finger) Site:  R long A1 Prep: patient was prepped and draped in usual sterile fashion   Needle Size:  25 G Approach:  Volar Medications:  0.3 mL lidocaine 1 %; 0.33 mL bupivacaine 0.5 %; 13.33 mg methylPREDNISolone acetate 40 MG/ML     Clinical Data: No additional findings.   Subjective: Chief Complaint  Patient presents with  . 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 She denies any radiation of pain or any injuries. Denies any constitutional symptoms.    Review of Systems    Constitutional: Negative.   HENT: Negative.   Eyes: Negative.   Respiratory: Negative.   Cardiovascular: Negative.   Endocrine: Negative.   Musculoskeletal: Negative.   Neurological: Negative.   Hematological: Negative.   Psychiatric/Behavioral: Negative.   All other systems reviewed and are negative.    Objective: Vital Signs: LMP 01/20/2017   Physical Exam  Constitutional: She is oriented to person, place, and time. She appears well-developed and well-nourished.  HENT:  Head: Normocephalic and atraumatic.  Eyes: EOM are normal.  Neck: Neck supple.  Pulmonary/Chest: Effort normal.  Abdominal: Soft.  Neurological: She is alert and oriented to person, place, and time.  Skin: Skin is warm. Capillary refill takes less than 2 seconds.  Psychiatric: She has a normal mood and affect. Her behavior is normal. Judgment and thought content normal.  Nursing note and vitals reviewed.   Ortho Exam Right hand exam shows a palpable semimobile cyst in line the middle finger around the distal palmar crease. There is no active triggering. This is tender to palpation. There is no skin changes or signs of infection. Specialty Comments:  No specialty comments available.  Imaging: No results found.   PMFS History: Patient Active Problem List   Diagnosis Date Noted  . Ganglion cyst of flexor tendon sheath of finger of right hand 02/02/2017  .  Trigger finger, right middle finger 02/02/2017  . Eczema 07/11/2016  . Pruritus 06/26/2016  . Dyspnea and respiratory abnormality 05/08/2016  . Wheezing 05/08/2016  . Elevated testosterone level in female 08/28/2015  . Elevated hemoglobin A1c 08/28/2015  . Abdominal pain, right upper quadrant 06/05/2015  . Abnormal urine 06/05/2015  . Abnormal alkaline phosphatase test 04/27/2015  . Severe obesity (BMI >= 40) ([place]) 12/19/2014  . Asthma with acute exacerbation 08/29/2014  . Moderate intermittent asthma 06/20/2014  . Rash, skin 06/20/2014  .  Moderate persistent asthma with acute exacerbation in adult 05/27/2014  . Anterior chest wall pain 05/27/2014  . Low grade squamous intraepithelial lesion (LGSIL) on cervical Pap smear 12/02/2013  . Obesity (BMI 30-39.9) 11/28/2013  . Visit for preventive health examination 11/28/2013  . Routine gynecological examination 11/28/2013  . Weight gain 11/28/2013  . Asthma 11/28/2013  . Moderate persistent asthma without complication 67/20/9470  . Hemiparesis ([place])   . Oral contraceptive pill surveillance 04/25/2012  . Headache(784.0) 06/04/2011  . Irregular periods 06/04/2011  . Acne 06/04/2011  . BCP (birth control pills) initiation 06/04/2011  . Preventative health care 02/18/2011  . Neoplasm of uncertain behavior of skin 02/18/2011  . BMI (body mass index), pediatric, 95-99% for age 09/20/2011  . BACK PAIN 09/17/2010  . NECK PAIN 01/02/2009  . FATIGUE 12/06/2008  . Attention deficit hyperactivity disorder (ADHD) 09/03/2007  . UNSPECIFIED HEARING LOSS 09/03/2007  . ALLERGIC RHINITIS 05/19/2007  . ASTHMA 05/19/2007   Past Medical History:  Diagnosis Date  . ADHD (attention deficit hyperactivity disorder)   . Allergy   . Asthma   . BMI (body mass index), pediatric, 95-99% for age 43/10/2010   Is starting to gain weight again and advised and counseled today about reduction for health risk reasons. Patient is very well aware of how to do lifestyle intervention she has done this before.   Marland Kitchen Hearing loss in right ear    with hearing aid bilateral   . Hemiparesis ([place])    rt from neonatal period  . Hypoxia of newborn   . Irregular periods 06/04/2011  . PCOS (polycystic ovarian syndrome)   . Pulmonary hemorrhage of fetus or newborn    83 week C-section [facility] of with birth hypoxia    Family History  Problem Relation Age of Onset  . Adopted: Yes  . Hypertension    . Schizophrenia Mother     with alcohol drug abuse  . Diabetes Other     Past Surgical History:  Procedure Laterality  Date  . TONSILLECTOMY     Social History   Occupational History  . Not on file.   Social History Main Topics  . Smoking status: Never Smoker  . Smokeless tobacco: Never Used  . Alcohol use No  . Drug use: No  . Sexual activity: Yes    Birth control/ protection: Pill

## 2017-02-07 IMAGING — DX DG CHEST 2V
2 series · 2 of 2 positions shown · non-contrast
Comparison: 04/30/2014

CLINICAL DATA: Right upper quadrant pain radiating to the chest and
shoulder. Two days duration. Pleuritic.

EXAM:
CHEST  2 VIEW

[chest pa]
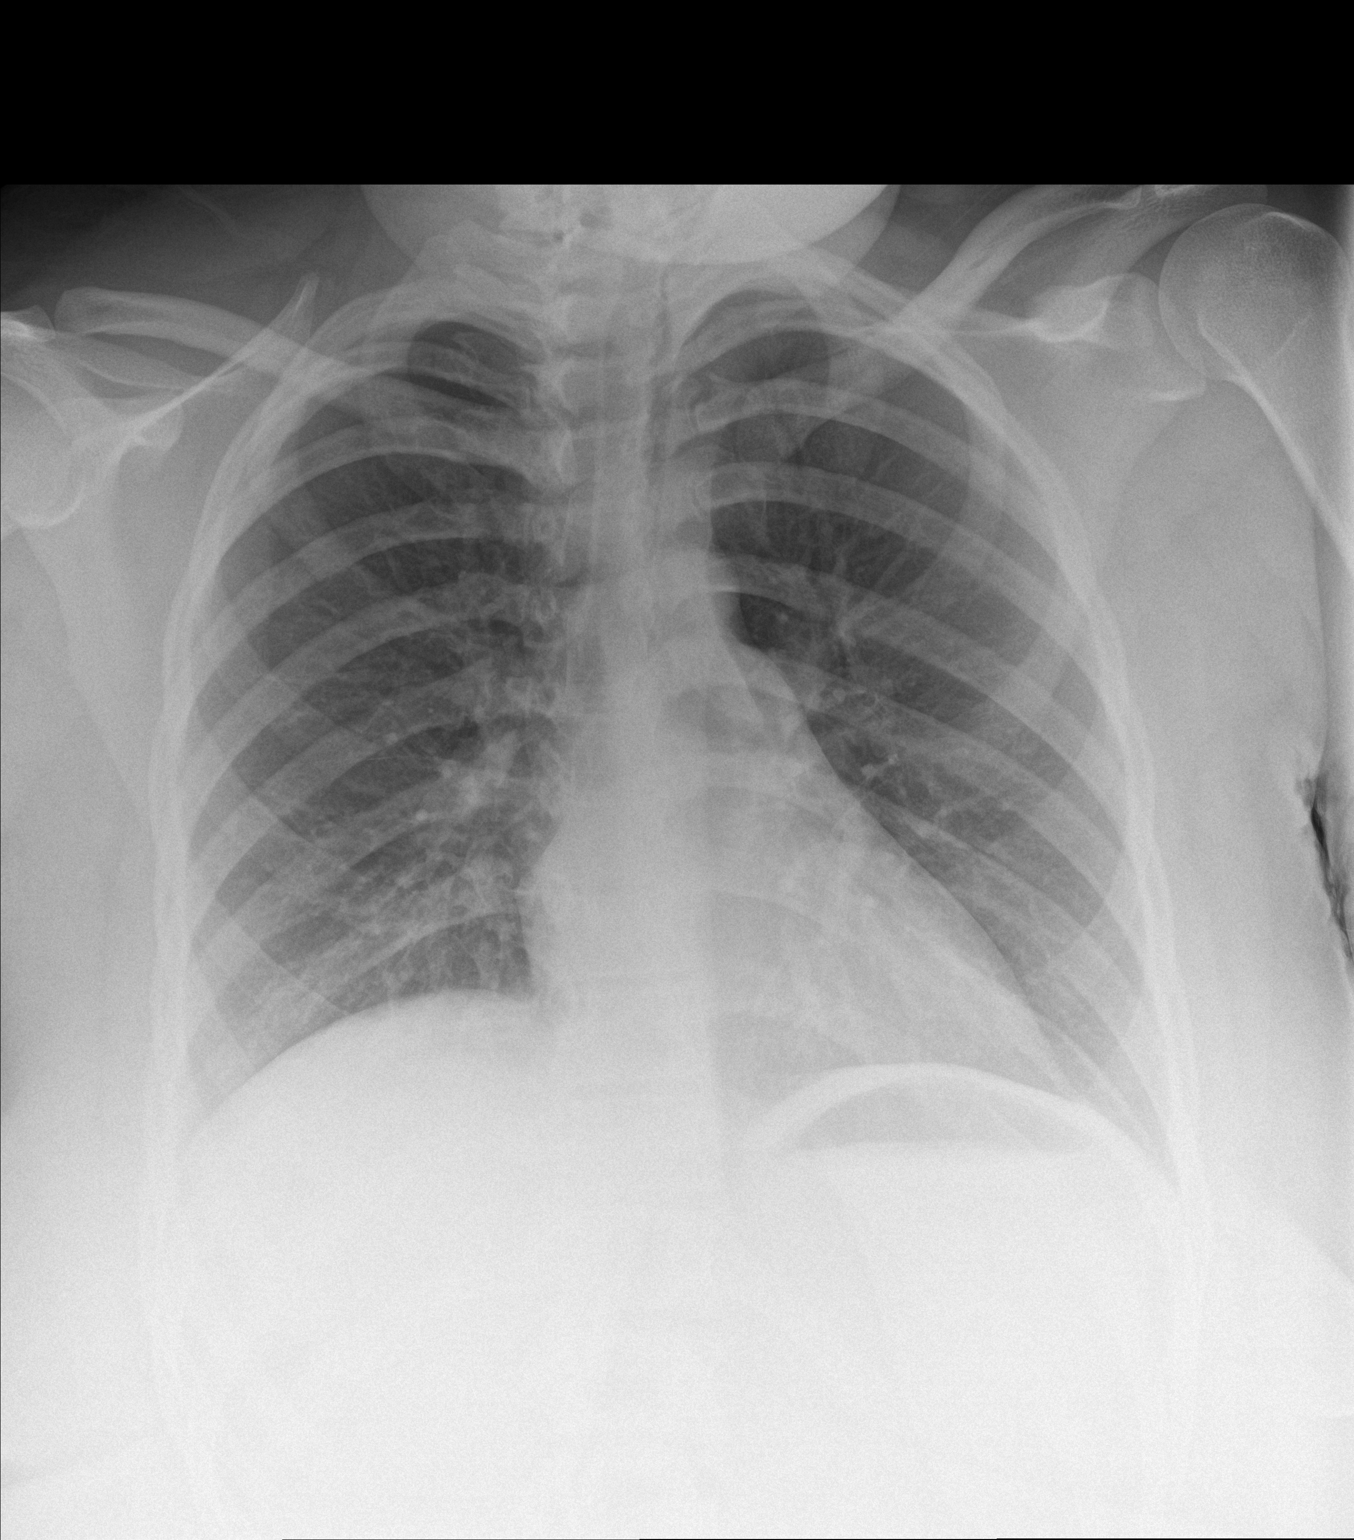

[chest lat]
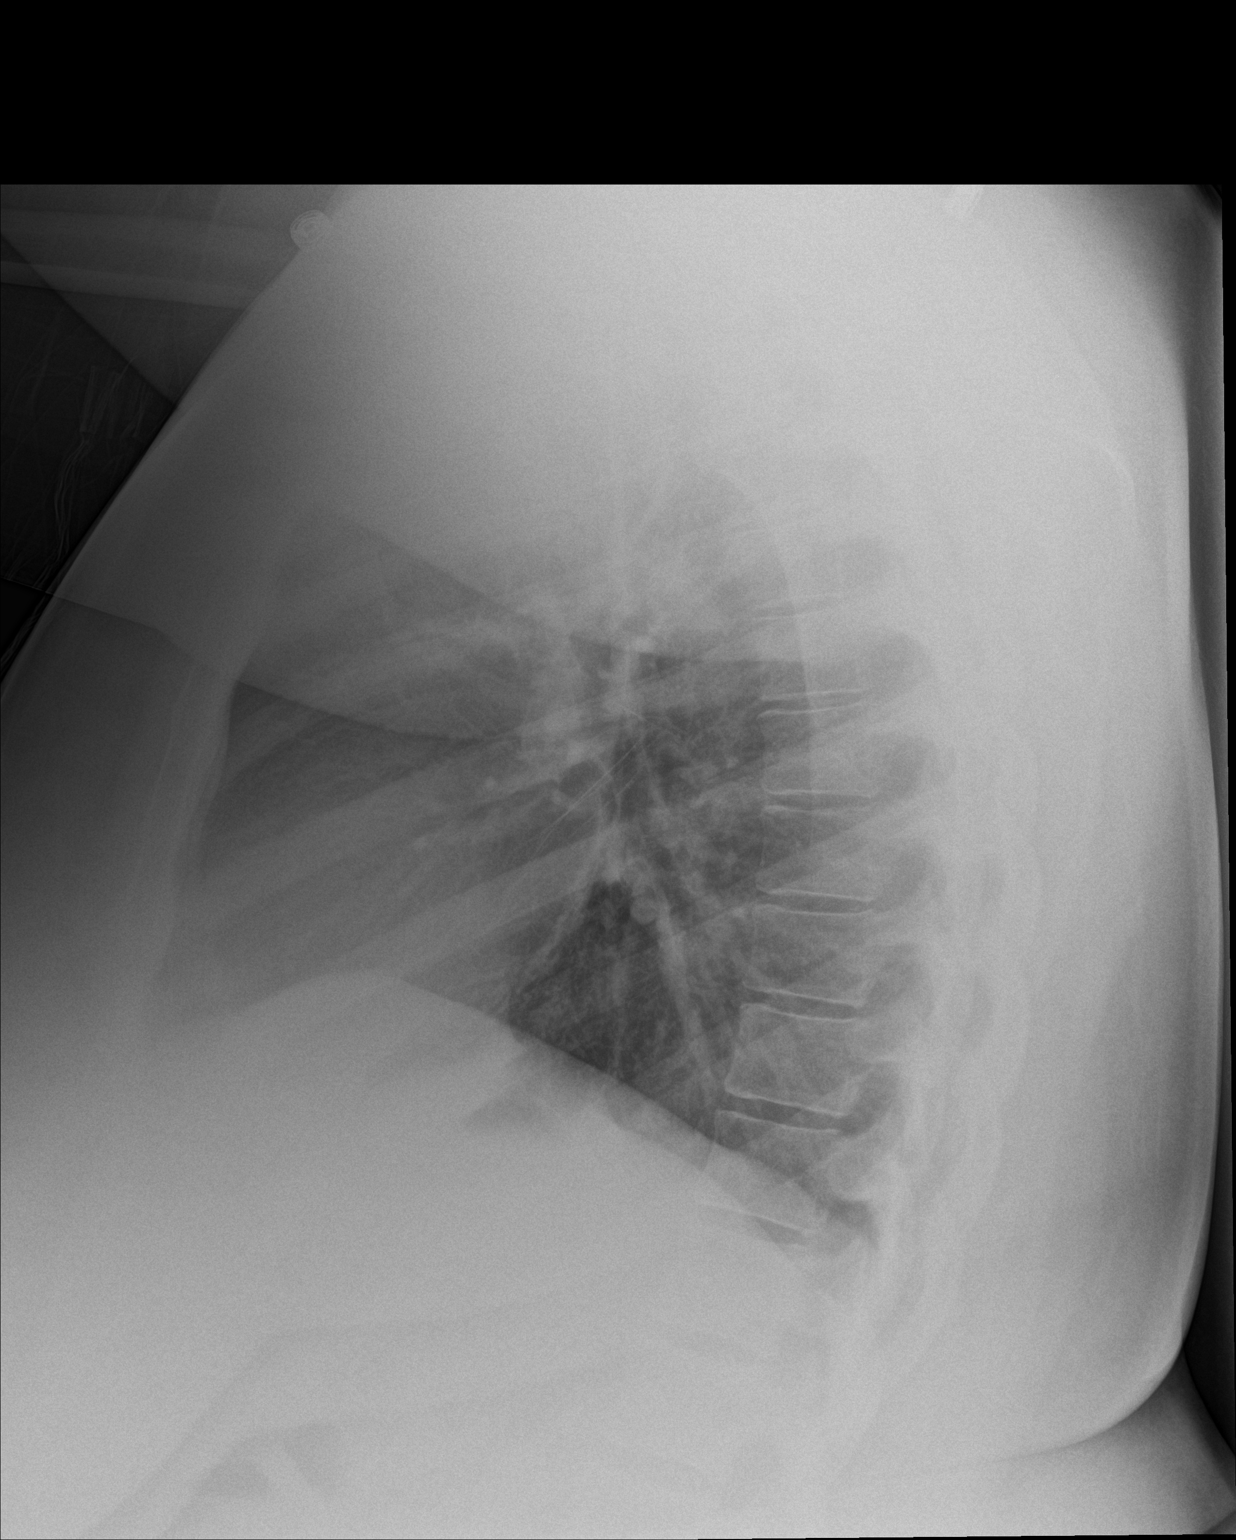

[2 of 2 positions shown; findings below may reference images not displayed]

FINDINGS: The heart size and mediastinal contours are within normal limits.
Both lungs are clear. The visualized skeletal structures are
unremarkable.
IMPRESSION: No active cardiopulmonary disease.

## 2017-02-18 ENCOUNTER — Encounter: Payer: Self-pay | Admitting: Adult Health

## 2017-02-18 ENCOUNTER — Other Ambulatory Visit: Payer: Self-pay

## 2017-02-18 ENCOUNTER — Telehealth: Payer: Self-pay | Admitting: Internal Medicine

## 2017-02-18 ENCOUNTER — Ambulatory Visit (INDEPENDENT_AMBULATORY_CARE_PROVIDER_SITE_OTHER): Payer: Federal, State, Local not specified - PPO | Admitting: Adult Health

## 2017-02-18 VITALS — BP 112/64 | Temp 98.2°F | Ht 67.5 in | Wt 299.8 lb

## 2017-02-18 DIAGNOSIS — J301 Allergic rhinitis due to pollen: Secondary | ICD-10-CM

## 2017-02-18 DIAGNOSIS — L309 Dermatitis, unspecified: Secondary | ICD-10-CM

## 2017-02-18 MED ORDER — FLUTICASONE PROPIONATE 50 MCG/ACT NA SUSP: 2.0000 | Freq: Every day | NASAL | 6 refills | Status: DC

## 2017-02-18 MED ORDER — LEVOCETIRIZINE DIHYDROCHLORIDE 5 MG PO TABS: 5.0000 mg | ORAL_TABLET | Freq: Every evening | ORAL | 3 refills | Status: DC

## 2017-02-18 MED ORDER — IPRATROPIUM BROMIDE 0.02 % IN SOLN: RESPIRATORY_TRACT | 0 refills | Status: DC

## 2017-02-18 MED ORDER — TRIAMCINOLONE ACETONIDE 0.5 % EX CREA: TOPICAL_CREAM | Freq: Two times a day (BID) | CUTANEOUS | 3 refills | Status: AC

## 2017-02-18 MED ORDER — TIOTROPIUM BROMIDE MONOHYDRATE 18 MCG IN CAPS: 18.0000 ug | ORAL_CAPSULE | Freq: Every day | RESPIRATORY_TRACT | 3 refills | Status: DC

## 2017-02-18 NOTE — Progress Notes (Signed)
Subjective:    Patient ID: Glenda Mendez, female    DOB: 1991/10/05, 26 y.o.   MRN: [id number]  She is out of her allergy medications     URI   The current episode started in the past 7 days. There has been no fever. Associated symptoms include coughing (semi productive ), headaches, a rash, rhinorrhea and a sore throat. Pertinent negatives include no congestion, dysuria, ear pain, nausea, plugged ear sensation, sinus pain, vomiting or wheezing. Treatments tried: nebulizers and OTC allergy medications  The treatment provided no relief.   Review of Systems  Constitutional: Negative.   HENT: Positive for postnasal drip, rhinorrhea and sore throat. Negative for congestion, ear discharge, ear pain, sinus pain, sinus pressure and trouble swallowing.   Respiratory: Positive for cough (semi productive ). Negative for chest tightness, shortness of breath and wheezing.   Cardiovascular: Negative.   Gastrointestinal: Negative for nausea and vomiting.  Genitourinary: Negative for dysuria.  Musculoskeletal: Negative.   Skin: Positive for rash.  Neurological: Positive for headaches.   Past Medical History:  Diagnosis Date  . ADHD (attention deficit hyperactivity disorder)   . Allergy   . Asthma   . BMI (body mass index), pediatric, 95-99% for age 19/10/2010   Is starting to gain weight again and advised and counseled today about reduction for health risk reasons. Patient is very well aware of how to do lifestyle intervention she has done this before.   Marland Kitchen Hearing loss in right ear    with hearing aid bilateral   . Hemiparesis ([place])    rt from neonatal period  . Hypoxia of newborn   . Irregular periods 06/04/2011  . PCOS (polycystic ovarian syndrome)   . Pulmonary hemorrhage of fetus or newborn    78 week C-section [facility] of with birth hypoxia    Social History   Social History  . Marital status: Single    Spouse name: N/A  . Number of children: N/A  . Years of education: N/A    Occupational History  . Not on file.   Social History Main Topics  . Smoking status: Never Smoker  . Smokeless tobacco: Never Used  . Alcohol use No  . Drug use: No  . Sexual activity: Yes    Birth control/ protection: Pill   Other Topics Concern  . 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 [name]- pulm at [facility] now has a new pulmonary Dr. Now stable and on prn       Thyroid evaluation in the past neck evaluation within normal limits and no thyroid disease.    Past Surgical History:  Procedure Laterality Date  . TONSILLECTOMY      Family History  Problem Relation Age of Onset  . Adopted: Yes  . Hypertension    . Schizophrenia Mother     with alcohol drug abuse  . Diabetes Other     No Known Allergies  Current Outpatient Prescriptions on File Prior to Visit  Medication Sig Dispense Refill  . albuterol (PROVENTIL HFA;VENTOLIN HFA) 108 (90 BASE) MCG/ACT inhaler Inhale 2 puffs into the lungs every 6 (six) hours as needed for wheezing or shortness of breath. 1 Inhaler 4  . albuterol (PROVENTIL) (2.5 MG/3ML) 0.083% nebulizer solution Take 3  mLs (2.5 mg total) by nebulization every 4 (four) hours as needed for wheezing or shortness of breath. 75 mL 2  . amoxicillin (AMOXIL) 500 MG capsule Take 1 capsule (500 mg total) by mouth 3 (three) times daily. 21 capsule 0  . fluticasone-salmeterol (ADVAIR HFA) 230-21 MCG/ACT inhaler Inhale 2 puffs into the lungs 2 (two) times daily.    . hydrOXYzine (ATARAX/VISTARIL) 10 MG tablet Take 1 tablet (10 mg total) by mouth daily as needed for itching. 30 tablet 0  . ipratropium (ATROVENT) 0.02 % nebulizer solution USE 1 VIAL BY NEBULIZATION 3 (THREE) TIMES DAILY AS NEEDED FOR WHEEZING. 62.5 mL 3  . metFORMIN (GLUCOPHAGE) 500 MG tablet Take 2 tablets (1,000  mg total) by mouth 2 (two) times daily with a meal. 120 tablet 5  . METHYLPHENIDATE 36 MG PO CR tablet TAKE (2) TABLETS DAILY. 60 tablet 0  . norgestimate-ethinyl estradiol (ORTHO-CYCLEN, 28,) 0.25-35 MG-MCG tablet Take 1 tablet by mouth daily. 1 Package 5  . omeprazole (PRILOSEC) 20 MG capsule Take 1 capsule (20 mg total) by mouth 2 (two) times daily before a meal. 60 capsule 3  . pantoprazole (PROTONIX) 40 MG tablet Take 1 tablet (40 mg total) by mouth daily. 30 tablet 0  . [DISCONTINUED] montelukast (SINGULAIR) 10 MG tablet Take 1 tablet (10 mg total) by mouth at bedtime. (Patient not taking: Reported on 03/07/2016) 90 tablet 3  . [DISCONTINUED] norethindrone-ethinyl estradiol (JUNEL FE,GILDESS FE,LOESTRIN FE) 1-20 MG-MCG tablet Take 1 tablet by mouth daily. (Patient not taking: Reported on 03/07/2016) 1 Package 11   No current facility-administered medications on file prior to visit.     BP 112/64 (BP Location: Left Arm, Patient Position: Sitting, Cuff Size: Normal)   Temp 98.2 F (36.8 C) (Oral)   Ht 5' 7.5" (1.715 m)   Wt 299 lb 12.8 oz (136 kg)   LMP 01/20/2017   BMI 46.26 kg/m       Objective:   Physical Exam  Constitutional: She is oriented to person, place, and time. She appears well-developed and well-nourished. No distress.  HENT:  Head: Normocephalic and atraumatic.  Right Ear: Tympanic membrane and external ear normal. Tympanic membrane is not erythematous and not bulging.  Left Ear: Tympanic membrane and ear canal normal. Tympanic membrane is not erythematous and not bulging.  Nose: Rhinorrhea present. No mucosal edema. Right sinus exhibits no maxillary sinus tenderness and no frontal sinus tenderness. Left sinus exhibits no maxillary sinus tenderness and no frontal sinus tenderness.  Mouth/Throat: Uvula is midline. Oropharyngeal exudate present. No posterior oropharyngeal edema or posterior oropharyngeal erythema.  Neck: Normal range of motion. Neck supple.   Cardiovascular: Normal rate, regular rhythm, normal heart sounds and intact distal pulses.  Exam reveals no gallop and no friction rub.   No murmur heard. Pulmonary/Chest: Effort normal and breath sounds normal. No respiratory distress. She has no wheezes. She has no rales. She exhibits no tenderness.  Musculoskeletal: Normal range of motion. She exhibits no edema or tenderness.  Lymphadenopathy:    She has no cervical adenopathy.  Neurological: She is alert and oriented to person, place, and time.  Skin: Skin is warm and dry. Rash (severe exzema on bilateral arms ) noted. She is not diaphoretic. No erythema. No pallor.  Psychiatric: She has a normal mood and affect. Her behavior is normal. Judgment and thought content normal.  Nursing note and vitals reviewed.     Assessment & Plan:  1. Seasonal allergic rhinitis due to pollen - Exam is  consistent with seasonal allergies. No signs of bacterial infection at this time.  - fluticasone (FLONASE) 50 MCG/ACT nasal spray; Place 2 sprays into both nostrils daily.  Dispense: 16 g; Refill: 6 - levocetirizine (XYZAL ALLERGY 24HR) 5 MG tablet; Take 1 tablet (5 mg total) by mouth every evening.  Dispense: 90 tablet; Refill: 3 - tiotropium (SPIRIVA) 18 MCG inhalation capsule; Place 1 capsule (18 mcg total) into inhaler and inhale daily.  Dispense: 30 capsule; Refill: 3 - Follow up as needed  2. Eczema, unspecified type  - triamcinolone cream (KENALOG) 0.5 %; Apply topically 2 (two) times daily. Use as directed  Dispense: 30 g; Refill: Brush, AGNP

## 2017-02-18 NOTE — Telephone Encounter (Signed)
Per [name], this is okay to refill.  Rx has been refilled to preferred pharmacy.

## 2017-02-18 NOTE — Patient Instructions (Addendum)
Your symptoms appears to be from seasonal allergies.   I will refill your xyzal, spirivia, and atrovent. I have also refilled the kenalog cream for the rash   Follow up if needed  Dr. Regis Bill will have to refill your Concerta when she comes back to the office

## 2017-02-18 NOTE — Telephone Encounter (Signed)
Pt state that she forgot to ask for a refill on her ipratropium  Pharm:  [facility].

## 2017-02-25 ENCOUNTER — Ambulatory Visit (INDEPENDENT_AMBULATORY_CARE_PROVIDER_SITE_OTHER): Payer: Federal, State, Local not specified - PPO | Admitting: Internal Medicine

## 2017-02-25 ENCOUNTER — Encounter: Payer: Self-pay | Admitting: Internal Medicine

## 2017-02-25 ENCOUNTER — Other Ambulatory Visit: Payer: Self-pay | Admitting: Internal Medicine

## 2017-02-25 VITALS — BP 120/70 | HR 106 | Temp 99.0°F | Wt 300.3 lb

## 2017-02-25 DIAGNOSIS — IMO0001 Reserved for inherently not codable concepts without codable children: Secondary | ICD-10-CM

## 2017-02-25 DIAGNOSIS — R1110 Vomiting, unspecified: Secondary | ICD-10-CM

## 2017-02-25 DIAGNOSIS — E8881 Metabolic syndrome: Secondary | ICD-10-CM

## 2017-02-25 DIAGNOSIS — Z0000 Encounter for general adult medical examination without abnormal findings: Secondary | ICD-10-CM

## 2017-02-25 DIAGNOSIS — J3489 Other specified disorders of nose and nasal sinuses: Secondary | ICD-10-CM

## 2017-02-25 DIAGNOSIS — J4520 Mild intermittent asthma, uncomplicated: Secondary | ICD-10-CM | POA: Diagnosis not present

## 2017-02-25 DIAGNOSIS — Z23 Encounter for immunization: Secondary | ICD-10-CM

## 2017-02-25 DIAGNOSIS — R7309 Other abnormal glucose: Secondary | ICD-10-CM

## 2017-02-25 DIAGNOSIS — R7301 Impaired fasting glucose: Secondary | ICD-10-CM

## 2017-02-25 DIAGNOSIS — F909 Attention-deficit hyperactivity disorder, unspecified type: Secondary | ICD-10-CM

## 2017-02-25 MED ORDER — AMOXICILLIN 875 MG PO TABS
875.0000 mg | ORAL_TABLET | Freq: Two times a day (BID) | ORAL | 0 refills | Status: DC
Start: 2017-02-25 — End: 2017-03-30

## 2017-02-25 NOTE — Patient Instructions (Addendum)
Considering a  Sinusitis not resolving  And also poss reflux adding to the   Cough reflex,  Increase to the protonix   To twice a day for  10 days    Can add antibiotic  For  A week for presumed secondary sinusitis .  Add the singulair  Back .  Every day  Due for blood work  And blood sugar  Testing .    Fasting labs  appt  I will put in orders   And then cpx In a month

## 2017-02-25 NOTE — Progress Notes (Signed)
Chief Complaint  Patient presents with  . Follow-up    Seen Glenda Mendez and not better.    HPI: Glenda Mendez 26 y.o.  sda  For ongoing resp sx  Seen by Np last week  For seasonal allergy sx   Headaches and  Vomiting      Harder to eat .    And then coughing at night  And  Taking albuterol rx  And  Given prednisone  20 x 2 day 10 x 2 days   Enough to break up . And lfonase . And  Cream for skin.   After laying down  And feels  Then cough and then "coming. Up" 30 mg    For 2 days .    Headaches  .    Taking ibu  .   Yellow red nasal discharge.    Face pain and frontal pain in head [name] .  ROS: See pertinent positives and negatives per HPI. No fever  Chills   Diarrhea   Past Medical History:  Diagnosis Date  . ADHD (attention deficit hyperactivity disorder)   . Allergy   . Asthma   . BMI (body mass index), pediatric, 95-99% for age 86/10/2010   Is starting to gain weight again and advised and counseled today about reduction for health risk reasons. Patient is very well aware of how to do lifestyle intervention she has done this before.   Marland Kitchen Hearing loss in right ear    with hearing aid bilateral   . Hemiparesis ([place])    rt from neonatal period  . Hypoxia of newborn   . Irregular periods 06/04/2011  . PCOS (polycystic ovarian syndrome)   . Pulmonary hemorrhage of fetus or newborn    63 week C-section [facility] of with birth hypoxia    Family History  Problem Relation Age of Onset  . Adopted: Yes  . Hypertension    . Schizophrenia Mother     with alcohol drug abuse  . Diabetes Other     Social History   Social History  . Marital status: Single    Spouse name: N/A  . Number of children: N/A  . Years of education: N/A   Social History Main Topics  . Smoking status: Never Smoker  . Smokeless tobacco: Never Used  . Alcohol use No  . Drug use: No  . Sexual activity: Yes    Birth control/ protection: Pill   Other Topics Concern  . 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 [name]- pulm at [facility] now has a new pulmonary Dr. Now stable and on prn       Thyroid evaluation in the past neck evaluation within normal limits and no thyroid disease.    Outpatient Medications Prior to Visit  Medication Sig Dispense Refill  . albuterol (PROVENTIL HFA;VENTOLIN HFA) 108 (90 BASE) MCG/ACT inhaler Inhale 2 puffs into the lungs every 6 (six) hours as needed for wheezing or shortness of breath. 1 Inhaler 4  . albuterol (PROVENTIL) (2.5 MG/3ML) 0.083% nebulizer solution Take 3 mLs (2.5 mg total) by nebulization every 4 (four) hours as needed for wheezing or shortness of breath. 75 mL 2  . fluticasone (FLONASE) 50 MCG/ACT nasal spray Place 2 sprays into both  nostrils daily. 16 g 6  . fluticasone-salmeterol (ADVAIR HFA) 230-21 MCG/ACT inhaler Inhale 2 puffs into the lungs 2 (two) times daily.    . hydrOXYzine (ATARAX/VISTARIL) 10 MG tablet Take 1 tablet (10 mg total) by mouth daily as needed for itching. 30 tablet 0  . ipratropium (ATROVENT) 0.02 % nebulizer solution USE 1 VIAL BY NEBULIZATION 3 (THREE) TIMES DAILY AS NEEDED FOR WHEEZING. 62.5 mL 0  . levocetirizine (XYZAL ALLERGY 24HR) 5 MG tablet Take 1 tablet (5 mg total) by mouth every evening. 90 tablet 3  . metFORMIN (GLUCOPHAGE) 500 MG tablet Take 2 tablets (1,000 mg total) by mouth 2 (two) times daily with a meal. 120 tablet 5  . METHYLPHENIDATE 36 MG PO CR tablet TAKE (2) TABLETS DAILY. 60 tablet 0  . norgestimate-ethinyl estradiol (ORTHO-CYCLEN, 28,) 0.25-35 MG-MCG tablet Take 1 tablet by mouth daily. 1 Package 5  . omeprazole (PRILOSEC) 20 MG capsule Take 1 capsule (20 mg total) by mouth 2 (two) times daily before a meal. 60 capsule 3  . pantoprazole (PROTONIX) 40 MG tablet Take 1 tablet (40 mg total) by mouth daily. 30 tablet  0  . tiotropium (SPIRIVA) 18 MCG inhalation capsule Place 1 capsule (18 mcg total) into inhaler and inhale daily. 30 capsule 3  . triamcinolone cream (KENALOG) 0.5 % Apply topically 2 (two) times daily. Use as directed 30 g 3  . amoxicillin (AMOXIL) 500 MG capsule Take 1 capsule (500 mg total) by mouth 3 (three) times daily. 21 capsule 0   No facility-administered medications prior to visit.      EXAM:  BP 120/70 (BP Location: Right Arm)   Pulse (!) 106   Temp 99 F (37.2 C) (Oral)   Wt (!) 300 lb 4.8 oz (136.2 kg)   SpO2 98%   BMI 46.34 kg/m   Body mass index is 46.34 kg/m.  GENERAL: vitals reviewed and listed above, alert, oriented, appears well hydrated and in no acute distress  HEENT: atraumatic, conjunctiva  clear, no obvious abnormalities on inspection of external nose and ears OP : no lesion edema or exudate  NECK: no obvious masses on inspection palpation  LUNGS: clear to auscultation bilaterally, no wheezes, rales or rhonchi, good air movement CV: HRRR, no clubbing cyanosis or  peripheral edema nl cap refill  MS: moves all extremities without noticeable focal  abnormality PSYCH: pleasant and cooperative, no obvious depression or anxiety Lab Results  Component Value Date   WBC 9.4 03/07/2016   HGB 11.6 (L) 03/07/2016   HCT 37.9 03/07/2016   PLT 357 03/07/2016   GLUCOSE 138 (H) 03/07/2016   CHOL 171 04/20/2015   TRIG 56.0 04/20/2015   HDL 59.40 04/20/2015   LDLCALC 100 (H) 04/20/2015   ALT 43 05/31/2015   AST 29 05/31/2015   NA 139 03/07/2016   K 3.5 03/07/2016   CL 104 03/07/2016   CREATININE 0.97 03/07/2016   BUN 11 03/07/2016   CO2 22 03/07/2016   TSH 3.18 04/20/2015   HGBA1C 6.1 06/26/2016    ASSESSMENT AND PLAN:  Discussed the following assessment and plan:  Post-tussive emesis  Sinus pain  Moderate intermittent asthma, uncomplicated  Need for tetanus booster - Plan: Td vaccine greater than or equal to 7yo preservative free IM  Morbid  obesity, unspecified obesity type ([place]) ? A;l;ergy eval?  repteat if not better?   ? If gerd  Adding  From .  and asthma  Overall multiple symptoms. However it does seem to it is  seems to be respiratory as opposed to GI. She is due for yearly monitoring metabolic sugar etc. we'll plan for it CPX output and lab orders to include an A1c. At this point we can add empiric therapy for possible bacterial sinusitis D50 risk-benefit. Add back the Singulair as it apparently helped her in the past although her allergy testing a few years ago was negative as far as blood work goes. She feels that a lot of her congestion occurred when she began working [place] with lots of pollen environmental triggers. She is still gaining weight which is problematic for all of her medical issues. She will increase her Protonix to twice a day add the Singulair antibiotic continue on the other Flonase etc. plan follow-up. -Patient advised to return or notify health care team  if symptoms worsen ,persist or new concerns arise.  Patient Instructions  Considering a  Sinusitis not resolving  And also poss reflux adding to the   Cough reflex,  Increase to the protonix   To twice a day for  10 days    Can add antibiotic  For  A week for presumed secondary sinusitis .  Add the singulair  Back .  Every day  Due for blood work  And blood sugar  Testing .    Fasting labs  appt  I will put in orders   And then cpx In a month       Britaney Espaillat K. Jacky Hartung M.D.

## 2017-02-25 NOTE — Progress Notes (Signed)
Lab Results  Component Value Date   WBC 9.4 03/07/2016   HGB 11.6 (L) 03/07/2016   HCT 37.9 03/07/2016   PLT 357 03/07/2016   GLUCOSE 138 (H) 03/07/2016   CHOL 171 04/20/2015   TRIG 56.0 04/20/2015   HDL 59.40 04/20/2015   LDLCALC 100 (H) 04/20/2015   ALT 43 05/31/2015   AST 29 05/31/2015   NA 139 03/07/2016   K 3.5 03/07/2016   CL 104 03/07/2016   CREATININE 0.97 03/07/2016   BUN 11 03/07/2016   CO2 22 03/07/2016   TSH 3.18 04/20/2015   HGBA1C 6.1 06/26/2016   Orders placed for cpx and fu dx

## 2017-03-23 ENCOUNTER — Other Ambulatory Visit (INDEPENDENT_AMBULATORY_CARE_PROVIDER_SITE_OTHER): Payer: Federal, State, Local not specified - PPO

## 2017-03-23 DIAGNOSIS — F909 Attention-deficit hyperactivity disorder, unspecified type: Secondary | ICD-10-CM

## 2017-03-23 DIAGNOSIS — Z0000 Encounter for general adult medical examination without abnormal findings: Secondary | ICD-10-CM

## 2017-03-23 DIAGNOSIS — R7301 Impaired fasting glucose: Secondary | ICD-10-CM

## 2017-03-23 DIAGNOSIS — R7309 Other abnormal glucose: Secondary | ICD-10-CM

## 2017-03-23 DIAGNOSIS — E8881 Metabolic syndrome: Secondary | ICD-10-CM

## 2017-03-23 LAB — CBC WITH DIFFERENTIAL/PLATELET
Basophils Absolute: 0 10*3/uL (ref 0.0–0.1)
Basophils Relative: 0.5 % (ref 0.0–3.0)
Eosinophils Absolute: 0.2 10*3/uL (ref 0.0–0.7)
Eosinophils Relative: 3.4 % (ref 0.0–5.0)
HCT: 36.2 % (ref 36.0–46.0)
Hemoglobin: 11.4 g/dL — ABNORMAL LOW (ref 12.0–15.0)
Lymphocytes Relative: 36.4 % (ref 12.0–46.0)
Lymphs Abs: 2.5 10*3/uL (ref 0.7–4.0)
MCHC: 31.4 g/dL (ref 30.0–36.0)
MCV: 67.5 fl — ABNORMAL LOW (ref 78.0–100.0)
Monocytes Absolute: 0.5 10*3/uL (ref 0.1–1.0)
Monocytes Relative: 7.2 % (ref 3.0–12.0)
Neutro Abs: 3.7 10*3/uL (ref 1.4–7.7)
Neutrophils Relative %: 52.5 % (ref 43.0–77.0)
Platelets: 504 10*3/uL — ABNORMAL HIGH (ref 150.0–400.0)
RBC: 5.37 Mil/uL — ABNORMAL HIGH (ref 3.87–5.11)
RDW: 16.4 % — ABNORMAL HIGH (ref 11.5–15.5)
WBC: 7 10*3/uL (ref 4.0–10.5)

## 2017-03-23 LAB — LIPID PANEL
Cholesterol: 189 mg/dL (ref 0–200)
HDL: 54 mg/dL (ref >39.00)
LDL Cholesterol: 121 mg/dL — ABNORMAL HIGH (ref 0–99)
NonHDL: 135.15
Total CHOL/HDL Ratio: 4
Triglycerides: 69 mg/dL (ref 0.0–149.0)
VLDL: 13.8 mg/dL (ref 0.0–40.0)

## 2017-03-23 LAB — HEPATIC FUNCTION PANEL
ALT: 19 U/L (ref 0–35)
AST: 15 U/L (ref 0–37)
Albumin: 4.3 g/dL (ref 3.5–5.2)
Alkaline Phosphatase: 127 U/L — ABNORMAL HIGH (ref 39–117)
Bilirubin, Direct: 0.1 mg/dL (ref 0.0–0.3)
Total Bilirubin: 0.4 mg/dL (ref 0.2–1.2)
Total Protein: 7.4 g/dL (ref 6.0–8.3)

## 2017-03-23 LAB — BASIC METABOLIC PANEL
BUN: 13 mg/dL (ref 6–23)
CO2: 25 mEq/L (ref 19–32)
Calcium: 9.8 mg/dL (ref 8.4–10.5)
Chloride: 105 mEq/L (ref 96–112)
Creatinine, Ser: 0.92 mg/dL (ref 0.40–1.20)
GFR: 96.08 mL/min (ref >60.00)
Glucose, Bld: 105 mg/dL — ABNORMAL HIGH (ref 70–99)
Potassium: 3.9 mEq/L (ref 3.5–5.1)
Sodium: 139 mEq/L (ref 135–145)

## 2017-03-23 LAB — HEMOGLOBIN A1C: Hgb A1c MFr Bld: 6.6 % — ABNORMAL HIGH (ref 4.6–6.5)

## 2017-03-23 LAB — TSH: TSH: 3.16 u[IU]/mL (ref 0.35–4.50)

## 2017-03-24 LAB — INSULIN, FASTING: Insulin fasting, serum: 33.1 u[IU]/mL — ABNORMAL HIGH (ref 2.0–19.6)

## 2017-03-29 NOTE — Progress Notes (Signed)
 Chief Complaint  Patient presents with  . Annual Exam    HPI: Patient  Glenda Mendez  26 y.o. comes in today for Preventive Health Care visit  And Chronic disease management   resp stable today has been on lots os pred this season.  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   Insulin resist pcos  hasnt taken metformin since  Jan gets SA and also just forget  She was ordrered  500 ir 2  Periods on ocps  Now some cramps  And 6-7 days  bid   Health Maintenance  Topic Date Due  . HIV Screening  03/30/2018 (Originally 10/05/2007)  . INFLUENZA VACCINE  05/20/2017  . PAP SMEAR  12/18/2017  . TETANUS/TDAP  02/26/2027   Health Maintenance Review LIFESTYLE:  Exercise:   Walking some  Tobacco/ETS:no Alcohol: no Sugar beverages: tea sweet not often   Sleep:  7 hours  Drug use: no   HH of  4   For now  Work:[employer]   Just starting . Retail  [employer]   Off 2 days monday     ROS:  GEN/ HEENT: No fever,  sweats headaches vision problems hearing changes, CV/ PULM; No chest pain shortness of breath cough, syncope,edema  change in exercise tolerance. GI /GU: No adominal pain, vomiting, change in bowel habits. No blood in the stool. No significant GU symptoms. SKIN/HEME: ,no acute skin rashes suspicious lesions or bleeding. No lymphadenopathy, nodules, masses.  NEURO/ PSYCH:  No neurologic signs such as weakness numbness. No depression anxiety. IMM/ Allergy: No unusual infections.  Allergy .   REST of 12 system review negative except as per HPI   Past Medical History:  Diagnosis Date  . ADHD (attention deficit hyperactivity disorder)   . Allergy   . Asthma   . BMI (body mass index), pediatric, 95-99% for age 02/18/2011   Is starting to gain weight again and advised and counseled today about reduction for health risk reasons. Patient is very well aware of how to do lifestyle intervention she has done this before.   . Hearing loss in right  ear    with hearing aid bilateral   . Hemiparesis (HCC)    rt from neonatal period  . Hypoxia of newborn   . Irregular periods 06/04/2011  . PCOS (polycystic ovarian syndrome)   . Pulmonary hemorrhage of fetus or newborn    38 week C-section [facility] of with birth hypoxia    Past Surgical History:  Procedure Laterality Date  . TONSILLECTOMY      Family History  Problem Relation Age of Onset  . Adopted: Yes  . Hypertension Unknown   . Schizophrenia Mother        with alcohol drug abuse  . Diabetes Other     Social History   Social History  . Marital status: Single    Spouse name: N/A  . Number of children: N/A  . Years of education: N/A   Social History Main Topics  . Smoking status: Never Smoker  . Smokeless tobacco: Never Used  . Alcohol use No  . Drug use: No  . Sexual activity: No   Other Topics Concern  . None   Social History Narrative   Adopted and raised by family member Wanda Gammell   [employer]  graduate [employer] second semester   Living at home with mom and has no car   Sleep 8+ hours     Seeing Dr. Majure- pulm at [facility] now has a new pulmonary Dr. Now stable and on prn       Thyroid evaluation in the past neck evaluation within normal limits and no thyroid disease.    Outpatient Medications Prior to Visit  Medication Sig Dispense Refill  . albuterol (PROVENTIL HFA;VENTOLIN HFA) 108 (90 BASE) MCG/ACT inhaler Inhale 2 puffs into the lungs every 6 (six) hours as needed for wheezing or shortness of breath. 1 Inhaler 4  . albuterol (PROVENTIL) (2.5 MG/3ML) 0.083% nebulizer solution Take 3 mLs (2.5 mg total) by nebulization every 4 (four) hours as needed for wheezing or shortness of breath. 75 mL 2  . fluticasone (FLONASE) 50 MCG/ACT nasal spray Place 2 sprays into both nostrils daily. 16 g 6  . fluticasone-salmeterol (ADVAIR HFA) 230-21 MCG/ACT inhaler Inhale 2 puffs into the lungs 2 (two) times daily.    . hydrOXYzine (ATARAX/VISTARIL) 10 MG  tablet Take 1 tablet (10 mg total) by mouth daily as needed for itching. 30 tablet 0  . ipratropium (ATROVENT) 0.02 % nebulizer solution USE 1 VIAL BY NEBULIZATION 3 (THREE) TIMES DAILY AS NEEDED FOR WHEEZING. 62.5 mL 0  . levocetirizine (XYZAL ALLERGY 24HR) 5 MG tablet Take 1 tablet (5 mg total) by mouth every evening. 90 tablet 3  . norgestimate-ethinyl estradiol (ORTHO-CYCLEN, 28,) 0.25-35 MG-MCG tablet Take 1 tablet by mouth daily. 1 Package 5  . omeprazole (PRILOSEC) 20 MG capsule Take 1 capsule (20 mg total) by mouth 2 (two) times daily before a meal. 60 capsule 3  . pantoprazole (PROTONIX) 40 MG tablet Take 1 tablet (40 mg total) by mouth daily. 30 tablet 0  . tiotropium (SPIRIVA) 18 MCG inhalation capsule Place 1 capsule (18 mcg total) into inhaler and inhale daily. 30 capsule 3  . triamcinolone cream (KENALOG) 0.5 % Apply topically 2 (two) times daily. Use as directed 30 g 3  . amoxicillin (AMOXIL) 875 MG tablet Take 1 tablet (875 mg total) by mouth 2 (two) times daily. 14 tablet 0  . metFORMIN (GLUCOPHAGE) 500 MG tablet Take 2 tablets (1,000 mg total) by mouth 2 (two) times daily with a meal. 120 tablet 5  . METHYLPHENIDATE 36 MG PO CR tablet TAKE (2) TABLETS DAILY. 60 tablet 0   No facility-administered medications prior to visit.      EXAM:  BP 120/60 (BP Location: Right Arm, Patient Position: Sitting, Cuff Size: Large)   Pulse (!) 107   Temp 98.2 F (36.8 C) (Oral)   Ht 5' 5.6" (1.666 m)   Wt 296 lb 1.6 oz (134.3 kg)   LMP 03/23/2017 (Exact Date)   BMI 48.38 kg/m   Body mass index is 48.38 kg/m. Wt Readings from Last 3 Encounters:  03/30/17 296 lb 1.6 oz (134.3 kg)  02/25/17 (!) 300 lb 4.8 oz (136.2 kg)  02/18/17 299 lb 12.8 oz (136 kg)    Physical Exam: Vital signs reviewed GEN:This is a well-developed well-nourished alert cooperative    who appearsr stated age in no acute distress.  HEENT: normocephalic atraumatic , Eyes: PERRL EOM's full, conjunctiva clear,  Nares: paten,t no deformity discharge or tenderness., Ears: no deformity EAC's clear TMs with normal landmarks. Mouth: clear OP, no lesions, edema.  Moist mucous membranes. Dentition in adequate repair. NECK: supple without masses, old surgical scar   no bruits. CHEST/PULM:  Clear to auscultation and percussion breath sounds equal no wheeze , rales or rhonchi. No chest wall deformities or tenderness. Breast: normal by inspection . No   dimpling, discharge, masses, tenderness or discharge . CV: PMI is nondisplaced, S1 S2 no gallops, murmurs, rubs. Peripheral pulses are full without delay.No JVD .  ABDOMEN: Bowel sounds normal nontender  No guard or rebound, no hepato splenomegal no CVA tenderness.   Extremtities:  No clubbing cyanosis or edema, no acute joint swelling or redness no focal atrophy NEURO:  Oriented x3, cranial nerves 3-12 appear to be intact, no obvious focal weakness,gait within normal limits no abnormal reflexes or asymmetrical SKIN: No acute rashes normal turgor, color, no bruising or petechiae. Dry skin atopy  Some ? Acanthosis?  PSYCH: Oriented, good eye contact, no obvious depression anxiety, cognition and judgment appear normal. LN: no cervical axillary inguinal adenopathy  Lab Results  Component Value Date   WBC 7.0 03/23/2017   HGB 11.4 (L) 03/23/2017   HCT 36.2 03/23/2017   PLT 504.0 (H) 03/23/2017   GLUCOSE 105 (H) 03/23/2017   CHOL 189 03/23/2017   TRIG 69.0 03/23/2017   HDL 54.00 03/23/2017   LDLCALC 121 (H) 03/23/2017   ALT 19 03/23/2017   AST 15 03/23/2017   NA 139 03/23/2017   K 3.9 03/23/2017   CL 105 03/23/2017   CREATININE 0.92 03/23/2017   BUN 13 03/23/2017   CO2 25 03/23/2017   TSH 3.16 03/23/2017   HGBA1C 6.6 (H) 03/23/2017    BP Readings from Last 3 Encounters:  03/30/17 120/60  02/25/17 120/70  02/18/17 112/64   Wt Readings from Last 3 Encounters:  03/30/17 296 lb 1.6 oz (134.3 kg)  02/25/17 (!) 300 lb 4.8 oz (136.2 kg)  02/18/17 299 lb  12.8 oz (136 kg)    Lab results reviewed with patient   ASSESSMENT AND PLAN:  Discussed the following assessment and plan:  Visit for preventive health examination  Morbid obesity, unspecified obesity type (HCC) - problematic   Insulin resistance - Sugars creeping up on prednisone as well as her obesity discussed importance of lifestyle intervention restart low dose extended release metformin and follow-up  Attention deficit hyperactivity disorder (ADHD), unspecified ADHD type - Benefit more than risk continue medication  Medication management  Oral contraceptive use - pcos with ele test and insulin a1c now in earlydiabeteic range    Take metformin   Try er  Follow up had gii se ?   Steroid may have added to sugar control  Many risk factors     For obesity  important at this time  May need to get back to endo  Bout for now  Encouraged to lose weigh tin healthy way as she did in the past has been to nutrition  In past . Off pred will address any missed opportunities at her FU visit in 3 mos Patient Care Team: Panosh, Wanda K, MD as PCP - General Young, William, MD (Ophthalmology) Lugogo, Njira, MD as Referring Physician (Internal Medicine) Gherghe, Cristina, MD as Consulting Physician (Internal Medicine) Patient Instructions   Work on losing weight and healthy manner monitor which are doing snacking don't need late at night should be somewhat hungry in the morning. Make sure you have protein for breakfast. Restart the metformin will send in an extended release version Take 1 a day at a meal for 2 weeks then increase to one twice a day.  Plan ROV in 3 month and we can recheck your blood sugar.     Preventing Type 2 Diabetes Mellitus Type 2 diabetes (type 2 diabetes mellitus) is a long-term (chronic) disease that affects blood sugar (glucose) levels.   Normally, a hormone called insulin allows glucose to enter cells in the body. The cells use glucose for energy. In type 2 diabetes,  one or both of these problems may be present:  The body does not make enough insulin.  The body does not respond properly to insulin that it makes (insulin resistance).  Insulin resistance or lack of insulin causes excess glucose to build up in the blood instead of going into cells. As a result, high blood glucose (hyperglycemia) develops, which can cause many complications. Being overweight or obese and having an inactive (sedentary) lifestyle can increase your risk for diabetes. Type 2 diabetes can be delayed or prevented by making certain nutrition and lifestyle changes. What nutrition changes can be made?  Eat healthy meals and snacks regularly. Keep a healthy snack with you for when you get hungry between meals, such as fruit or a handful of nuts.  Eat lean meats and proteins that are low in saturated fats, such as chicken, fish, egg whites, and beans. Avoid processed meats.  Eat plenty of fruits and vegetables and plenty of grains that have not been processed (whole grains). It is recommended that you eat: ? 1?2 cups of fruit every day. ? 2?3 cups of vegetables every day. ? 6?8 oz of whole grains every day, such as oats, whole wheat, bulgur, brown rice, quinoa, and millet.  Eat low-fat dairy products, such as milk, yogurt, and cheese.  Eat foods that contain healthy fats, such as nuts, avocado, olive oil, and canola oil.  Drink water throughout the day. Avoid drinks that contain added sugar, such as soda or sweet tea.  Follow instructions from your health care provider about specific eating or drinking restrictions.  Control how much food you eat at a time (portion size). ? Check food labels to find out the serving sizes of foods. ? Use a kitchen scale to weigh amounts of foods.  Saute or steam food instead of frying it. Cook with water or broth instead of oils or butter.  Limit your intake of: ? Salt (sodium). Have no more than 1 tsp (2,400 mg) of sodium a day. If you have  heart disease or high blood pressure, have less than ? tsp (1,500 mg) of sodium a day. ? Saturated fat. This is fat that is solid at room temperature, such as butter or fat on meat. What lifestyle changes can be made?  Activity  Do moderate-intensity physical activity for at least 30 minutes on at least 5 days of the week, or as much as told by your health care provider.  Ask your health care provider what activities are safe for you. A mix of physical activities may be best, such as walking, swimming, cycling, and strength training.  Try to add physical activity into your day. For example: ? Park in spots that are farther away than usual, so that you walk more. For example, park in a far corner of the parking lot when you go to the office or the grocery store. ? Take a walk during your lunch break. ? Use stairs instead of elevators or escalators. Weight Loss  Lose weight as directed. Your health care provider can determine how much weight loss is best for you and can help you lose weight safely.  If you are overweight or obese, you may be instructed to lose at least 5?7 % of your body weight. Alcohol and Tobacco   Limit alcohol intake to no more than 1 drink a day for nonpregnant   women and 2 drinks a day for men. One drink equals 12 oz of beer, 5 oz of wine, or 1 oz of hard liquor.  Do not use any tobacco products, such as cigarettes, chewing tobacco, and e-cigarettes. If you need help quitting, ask your health care provider. Work With Your Health Care Provider  Have your blood glucose tested regularly, as told by your health care provider.  Discuss your risk factors and how you can reduce your risk for diabetes.  Get screening tests as told by your health care provider. You may have screening tests regularly, especially if you have certain risk factors for type 2 diabetes.  Make an appointment with a diet and nutrition specialist (registered dietitian). A registered dietitian  can help you make a healthy eating plan and can help you understand portion sizes and food labels. Why are these changes important?  It is possible to prevent or delay type 2 diabetes and related health problems by making lifestyle and nutrition changes.  It can be difficult to recognize signs of type 2 diabetes. The best way to avoid possible damage to your body is to take actions to prevent the disease before you develop symptoms. What can happen if changes are not made?  Your blood glucose levels may keep increasing. Having high blood glucose for a long time is dangerous. Too much glucose in your blood can damage your blood vessels, heart, kidneys, nerves, and eyes.  You may develop prediabetes or type 2 diabetes. Type 2 diabetes can lead to many chronic health problems and complications, such as: ? Heart disease. ? Stroke. ? Blindness. ? Kidney disease. ? Depression. ? Poor circulation in the feet and legs, which could lead to surgical removal (amputation) in severe cases. Where to find support:  Ask your health care provider to recommend a registered dietitian, diabetes educator, or weight loss program.  Look for local or online weight loss groups.  Join a gym, fitness club, or outdoor activity group, such as a walking club. Where to find more information: To learn more about diabetes and diabetes prevention, visit:  American Diabetes Association (ADA): www.diabetes.org  National Institute of Diabetes and Digestive and Kidney Diseases: www.niddk.nih.gov/health-information/diabetes  To learn more about healthy eating, visit:  The U.S. Department of Agriculture (USDA), Choose My Plate: www.choosemyplate.gov/food-groups  Office of Disease Prevention and Health Promotion (ODPHP), Dietary Guidelines: www.health.gov/dietaryguidelines  Summary  You can reduce your risk for type 2 diabetes by increasing your physical activity, eating healthy foods, and losing weight as  directed.  Talk with your health care provider about your risk for type 2 diabetes. Ask about any blood tests or screening tests that you need to have. This information is not intended to replace advice given to you by your health care provider. Make sure you discuss any questions you have with your health care provider. Document Released: 01/28/2016 Document Revised: 03/13/2016 Document Reviewed: 11/27/2015 Elsevier Interactive Patient Education  2018 Elsevier Inc.    Preventive Care 18-39 Years, Female Preventive care refers to lifestyle choices and visits with your health care provider that can promote health and wellness. What does preventive care include?  A yearly physical exam. This is also called an annual well check.  Dental exams once or twice a year.  Routine eye exams. Ask your health care provider how often you should have your eyes checked.  Personal lifestyle choices, including: ? Daily care of your teeth and gums. ? Regular physical activity. ? Eating a healthy diet. ? Avoiding   tobacco and drug use. ? Limiting alcohol use. ? Practicing safe sex. ? Taking vitamin and mineral supplements as recommended by your health care provider. What happens during an annual well check? The services and screenings done by your health care provider during your annual well check will depend on your age, overall health, lifestyle risk factors, and family history of disease. Counseling Your health care provider may ask you questions about your:  Alcohol use.  Tobacco use.  Drug use.  Emotional well-being.  Home and relationship well-being.  Sexual activity.  Eating habits.  Work and work Statistician.  Method of birth control.  Menstrual cycle.  Pregnancy history.  Screening You may have the following tests or measurements:  Height, weight, and BMI.  Diabetes screening. This is done by checking your blood sugar (glucose) after you have not eaten for a while  (fasting).  Blood pressure.  Lipid and cholesterol levels. These may be checked every 5 years starting at age 47.  Skin check.  Hepatitis C blood test.  Hepatitis B blood test.  Sexually transmitted disease (STD) testing.  BRCA-related cancer screening. This may be done if you have a family history of breast, ovarian, tubal, or peritoneal cancers.  Pelvic exam and Pap test. This may be done every 3 years starting at age 44. Starting at age 71, this may be done every 5 years if you have a Pap test in combination with an HPV test.  Discuss your test results, treatment options, and if necessary, the need for more tests with your health care provider. Vaccines Your health care provider may recommend certain vaccines, such as:  Influenza vaccine. This is recommended every year.  Tetanus, diphtheria, and acellular pertussis (Tdap, Td) vaccine. You may need a Td booster every 10 years.  Varicella vaccine. You may need this if you have not been vaccinated.  HPV vaccine. If you are 25 or younger, you may need three doses over 6 months.  Measles, mumps, and rubella (MMR) vaccine. You may need at least one dose of MMR. You may also need a second dose.  Pneumococcal 13-valent conjugate (PCV13) vaccine. You may need this if you have certain conditions and were not previously vaccinated.  Pneumococcal polysaccharide (PPSV23) vaccine. You may need one or two doses if you smoke cigarettes or if you have certain conditions.  Meningococcal vaccine. One dose is recommended if you are age 75-21 years and a first-year college student living in a residence hall, or if you have one of several medical conditions. You may also need additional booster doses.  Hepatitis A vaccine. You may need this if you have certain conditions or if you travel or work in places where you may be exposed to hepatitis A.  Hepatitis B vaccine. You may need this if you have certain conditions or if you travel or work in  places where you may be exposed to hepatitis B.  Haemophilus influenzae type b (Hib) vaccine. You may need this if you have certain risk factors.  Talk to your health care provider about which screenings and vaccines you need and how often you need them. This information is not intended to replace advice given to you by your health care provider. Make sure you discuss any questions you have with your health care provider. Document Released: 12/02/2001 Document Revised: 06/25/2016 Document Reviewed: 08/07/2015 Elsevier Interactive Patient Education  2017 [place] K. Panosh M.D.

## 2017-03-30 ENCOUNTER — Encounter: Payer: Self-pay | Admitting: Internal Medicine

## 2017-03-30 ENCOUNTER — Ambulatory Visit (INDEPENDENT_AMBULATORY_CARE_PROVIDER_SITE_OTHER): Payer: Federal, State, Local not specified - PPO | Admitting: Internal Medicine

## 2017-03-30 VITALS — BP 120/60 | HR 107 | Temp 98.2°F | Ht 65.6 in | Wt 296.1 lb

## 2017-03-30 DIAGNOSIS — Z79899 Other long term (current) drug therapy: Secondary | ICD-10-CM | POA: Diagnosis not present

## 2017-03-30 DIAGNOSIS — Z0000 Encounter for general adult medical examination without abnormal findings: Secondary | ICD-10-CM | POA: Diagnosis not present

## 2017-03-30 DIAGNOSIS — Z3041 Encounter for surveillance of contraceptive pills: Secondary | ICD-10-CM | POA: Diagnosis not present

## 2017-03-30 DIAGNOSIS — E8881 Metabolic syndrome: Secondary | ICD-10-CM

## 2017-03-30 DIAGNOSIS — F909 Attention-deficit hyperactivity disorder, unspecified type: Secondary | ICD-10-CM | POA: Diagnosis not present

## 2017-03-30 MED ORDER — METHYLPHENIDATE HCL ER (OSM) 36 MG PO TBCR: EXTENDED_RELEASE_TABLET | ORAL | 0 refills | Status: DC

## 2017-03-30 MED ORDER — METFORMIN HCL ER 500 MG PO TB24: 500.0000 mg | ORAL_TABLET | Freq: Every day | ORAL | 5 refills | Status: DC

## 2017-03-30 NOTE — Patient Instructions (Addendum)
Work on losing weight and healthy manner monitor which are doing snacking don't need late at night should be somewhat hungry in the morning. Make sure you have protein for breakfast. Restart the metformin will send in an extended release version Take 1 a day at a meal for 2 weeks then increase to one twice a day.  Plan ROV in 3 month and we can recheck your blood sugar.     Preventing Type 2 Diabetes Mellitus Type 2 diabetes (type 2 diabetes mellitus) is a long-term (chronic) disease that affects blood sugar (glucose) levels. Normally, a hormone called insulin allows glucose to enter cells in the body. The cells use glucose for energy. In type 2 diabetes, one or both of these problems may be present:  The body does not make enough insulin.  The body does not respond properly to insulin that it makes (insulin resistance).  Insulin resistance or lack of insulin causes excess glucose to build up in the blood instead of going into cells. As a result, high blood glucose (hyperglycemia) develops, which can cause many complications. Being overweight or obese and having an inactive (sedentary) lifestyle can increase your risk for diabetes. Type 2 diabetes can be delayed or prevented by making certain nutrition and lifestyle changes. What nutrition changes can be made?  Eat healthy meals and snacks regularly. Keep a healthy snack with you for when you get hungry between meals, such as fruit or a handful of nuts.  Eat lean meats and proteins that are low in saturated fats, such as chicken, fish, egg whites, and beans. Avoid processed meats.  Eat plenty of fruits and vegetables and plenty of grains that have not been processed (whole grains). It is recommended that you eat: ? 1?2 cups of fruit every day. ? 2?3 cups of vegetables every day. ? 6?8 oz of whole grains every day, such as oats, whole wheat, bulgur, brown rice, quinoa, and millet.  Eat low-fat dairy products, such as milk, yogurt, and  cheese.  Eat foods that contain healthy fats, such as nuts, avocado, olive oil, and canola oil.  Drink water throughout the day. Avoid drinks that contain added sugar, such as soda or sweet tea.  Follow instructions from your health care provider about specific eating or drinking restrictions.  Control how much food you eat at a time (portion size). ? Check food labels to find out the serving sizes of foods. ? Use a kitchen scale to weigh amounts of foods.  Saute or steam food instead of frying it. Cook with water or broth instead of oils or butter.  Limit your intake of: ? Salt (sodium). Have no more than 1 tsp (2,400 mg) of sodium a day. If you have heart disease or high blood pressure, have less than ? tsp (1,500 mg) of sodium a day. ? Saturated fat. This is fat that is solid at room temperature, such as butter or fat on meat. What lifestyle changes can be made?  Activity  Do moderate-intensity physical activity for at least 30 minutes on at least 5 days of the week, or as much as told by your health care provider.  Ask your health care provider what activities are safe for you. A mix of physical activities may be best, such as walking, swimming, cycling, and strength training.  Try to add physical activity into your day. For example: ? Park in spots that are farther away than usual, so that you walk more. For example, park in a far corner  of the parking lot when you go to the office or the grocery store. ? Take a walk during your lunch break. ? Use stairs instead of elevators or escalators. Weight Loss  Lose weight as directed. Your health care provider can determine how much weight loss is best for you and can help you lose weight safely.  If you are overweight or obese, you may be instructed to lose at least 5?7 % of your body weight. Alcohol and Tobacco   Limit alcohol intake to no more than 1 drink a day for nonpregnant women and 2 drinks a day for men. One drink equals  12 oz of beer, 5 oz of wine, or 1 oz of hard liquor.  Do not use any tobacco products, such as cigarettes, chewing tobacco, and e-cigarettes. If you need help quitting, ask your health care provider. Work With [place] Provider  Have your blood glucose tested regularly, as told by your health care provider.  Discuss your risk factors and how you can reduce your risk for diabetes.  Get screening tests as told by your health care provider. You may have screening tests regularly, especially if you have certain risk factors for type 2 diabetes.  Make an appointment with a diet and nutrition specialist (registered dietitian). A registered dietitian can help you make a healthy eating plan and can help you understand portion sizes and food labels. Why are these changes important?  It is possible to prevent or delay type 2 diabetes and related health problems by making lifestyle and nutrition changes.  It can be difficult to recognize signs of type 2 diabetes. The best way to avoid possible damage to your body is to take actions to prevent the disease before you develop symptoms. What can happen if changes are not made?  Your blood glucose levels may keep increasing. Having high blood glucose for a long time is dangerous. Too much glucose in your blood can damage your blood vessels, heart, kidneys, nerves, and eyes.  You may develop prediabetes or type 2 diabetes. Type 2 diabetes can lead to many chronic health problems and complications, such as: ? Heart disease. ? Stroke. ? Blindness. ? Kidney disease. ? Depression. ? Poor circulation in the feet and legs, which could lead to surgical removal (amputation) in severe cases. Where to find support:  Ask your health care provider to recommend a registered dietitian, diabetes educator, or weight loss program.  Look for local or online weight loss groups.  Join a gym, fitness club, or outdoor activity group, such as a walking  club. Where to find more information: To learn more about diabetes and diabetes prevention, visit:  American Diabetes Association (ADA): www.diabetes.[employer] of Diabetes and Digestive and Kidney Diseases: FindSpin.nl  To learn more about healthy eating, visit:  The U.S. Department of Agriculture Scientist, research (physical sciences)), Choose My Plate: http://wiley-williams.com/  Office of Disease Prevention and Health Promotion (ODPHP), Dietary Guidelines: SurferLive.at  Summary  You can reduce your risk for type 2 diabetes by increasing your physical activity, eating healthy foods, and losing weight as directed.  Talk with your health care provider about your risk for type 2 diabetes. Ask about any blood tests or screening tests that you need to have. This information is not intended to replace advice given to you by your health care provider. Make sure you discuss any questions you have with your health care provider. Document Released: 01/28/2016 Document Revised: 03/13/2016 Document Reviewed: 11/27/2015 Elsevier Interactive Patient Education  2018 [place] 18-39 Years, Female Preventive care refers to lifestyle choices and visits with your health care provider that can promote health and wellness. What does preventive care include?  A yearly physical exam. This is also called an annual well check.  Dental exams once or twice a year.  Routine eye exams. Ask your health care provider how often you should have your eyes checked.  Personal lifestyle choices, including: ? Daily care of your teeth and gums. ? Regular physical activity. ? Eating a healthy diet. ? Avoiding tobacco and drug use. ? Limiting alcohol use. ? Practicing safe sex. ? Taking vitamin and mineral supplements as recommended by your health care provider. What happens during an annual well check? The services and screenings done by your  health care provider during your annual well check will depend on your age, overall health, lifestyle risk factors, and family history of disease. Counseling Your health care provider may ask you questions about your:  Alcohol use.  Tobacco use.  Drug use.  Emotional well-being.  Home and relationship well-being.  Sexual activity.  Eating habits.  Work and work Statistician.  Method of birth control.  Menstrual cycle.  Pregnancy history.  Screening You may have the following tests or measurements:  Height, weight, and BMI.  Diabetes screening. This is done by checking your blood sugar (glucose) after you have not eaten for a while (fasting).  Blood pressure.  Lipid and cholesterol levels. These may be checked every 5 years starting at age 20.  Skin check.  Hepatitis C blood test.  Hepatitis B blood test.  Sexually transmitted disease (STD) testing.  BRCA-related cancer screening. This may be done if you have a family history of breast, ovarian, tubal, or peritoneal cancers.  Pelvic exam and Pap test. This may be done every 3 years starting at age 68. Starting at age 56, this may be done every 5 years if you have a Pap test in combination with an HPV test.  Discuss your test results, treatment options, and if necessary, the need for more tests with your health care provider. Vaccines Your health care provider may recommend certain vaccines, such as:  Influenza vaccine. This is recommended every year.  Tetanus, diphtheria, and acellular pertussis (Tdap, Td) vaccine. You may need a Td booster every 10 years.  Varicella vaccine. You may need this if you have not been vaccinated.  HPV vaccine. If you are 9 or younger, you may need three doses over 6 months.  Measles, mumps, and rubella (MMR) vaccine. You may need at least one dose of MMR. You may also need a second dose.  Pneumococcal 13-valent conjugate (PCV13) vaccine. You may need this if you have certain  conditions and were not previously vaccinated.  Pneumococcal polysaccharide (PPSV23) vaccine. You may need one or two doses if you smoke cigarettes or if you have certain conditions.  Meningococcal vaccine. One dose is recommended if you are age 51-21 years and a first-year college student living in a residence hall, or if you have one of several medical conditions. You may also need additional booster doses.  Hepatitis A vaccine. You may need this if you have certain conditions or if you travel or work in places where you may be exposed to hepatitis A.  Hepatitis B vaccine. You may need this if you have certain conditions or if you travel or work in places where you may be exposed to hepatitis B.  Haemophilus influenzae type b (  Hib) vaccine. You may need this if you have certain risk factors.  Talk to your health care provider about which screenings and vaccines you need and how often you need them. This information is not intended to replace advice given to you by your health care provider. Make sure you discuss any questions you have with your health care provider. Document Released: 12/02/2001 Document Revised: 06/25/2016 Document Reviewed: 08/07/2015 Elsevier Interactive Patient Education  2017 [employer].

## 2017-05-14 ENCOUNTER — Telehealth: Payer: Self-pay | Admitting: Pediatrics

## 2017-05-14 NOTE — Telephone Encounter (Signed)
  Who's calling (name and relationship to patient) :Glenda Mendez, Glenda (consulting)) Glenda Mendez, daughter of [name] . Birth Public relations account executive.  Best contact number:  Provider they see:  Reason for call:Patient said she hopes you remember her. She is wanting to let Dr. Gaynell Face know how she is doing. Chistine also stated she would like to drop in to say hello sometime. She would like a call back to just talk or see when good time fr her to stop by.     PRESCRIPTION REFILL ONLY  Name of prescription:  Pharmacy:

## 2017-05-15 NOTE — Telephone Encounter (Signed)
A contact telephone number was not left.  I called the home phone and left a message on it.  I recommended calling back with a contact number for her.

## 2017-06-09 ENCOUNTER — Encounter: Payer: Self-pay | Admitting: Internal Medicine

## 2017-06-09 ENCOUNTER — Ambulatory Visit (INDEPENDENT_AMBULATORY_CARE_PROVIDER_SITE_OTHER): Payer: Federal, State, Local not specified - PPO | Admitting: Internal Medicine

## 2017-06-09 VITALS — BP 122/80 | HR 102 | Temp 98.3°F | Wt 293.5 lb

## 2017-06-09 DIAGNOSIS — R42 Dizziness and giddiness: Secondary | ICD-10-CM | POA: Diagnosis not present

## 2017-06-09 DIAGNOSIS — Z79899 Other long term (current) drug therapy: Secondary | ICD-10-CM | POA: Diagnosis not present

## 2017-06-09 DIAGNOSIS — J4520 Mild intermittent asthma, uncomplicated: Secondary | ICD-10-CM | POA: Diagnosis not present

## 2017-06-09 DIAGNOSIS — IMO0001 Reserved for inherently not codable concepts without codable children: Secondary | ICD-10-CM

## 2017-06-09 DIAGNOSIS — E8881 Metabolic syndrome: Secondary | ICD-10-CM

## 2017-06-09 DIAGNOSIS — R7303 Prediabetes: Secondary | ICD-10-CM

## 2017-06-09 MED ORDER — METFORMIN HCL ER 500 MG PO TB24: 500.0000 mg | ORAL_TABLET | Freq: Every day | ORAL | 5 refills | Status: DC

## 2017-06-09 NOTE — Progress Notes (Signed)
Chief Complaint  Patient presents with  . Dizziness    HPI: Glenda Mendez 26 y.o.  sda   Onset at work felt light headed hot  And   Then got dizzy and chest  Hurt .    Chest tight after  Leaving and called mom and then here .  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 Feels better now  ? If asthma   Had just gotten eaten had jello this  Am .  ROS: See pertinent positives and negatives per HPI. takking one metformin per day    So far  Trying to do better sleep and eating  Past Medical History:  Diagnosis Date  . ADHD (attention deficit hyperactivity disorder)   . Allergy   . Asthma   . BMI (body mass index), pediatric, 95-99% for age 02/18/2011   Is starting to gain weight again and advised and counseled today about reduction for health risk reasons. Patient is very well aware of how to do lifestyle intervention she has done this before.   Marland Kitchen Hearing loss in right ear    with hearing aid bilateral   . Hemiparesis ([place])    rt from neonatal period  . Hypoxia of newborn   . Irregular periods 06/04/2011  . PCOS (polycystic ovarian syndrome)   . Pulmonary hemorrhage of fetus or newborn    78 week C-section [facility] of with birth hypoxia    Family History  Problem Relation Age of Onset  . Adopted: Yes  . Hypertension Unknown   . Schizophrenia Mother        with alcohol drug abuse  . Diabetes Other     Social History   Social History  . Marital status: Single    Spouse name: N/A  . Number of children: N/A  . Years of education: N/A   Social History Main Topics  . Smoking status: Never Smoker  . Smokeless tobacco: Never Used  . Alcohol use No  . Drug use: No  . Sexual activity: No   Other Topics Concern  . None   Social History Narrative   Adopted and raised by family member Dagmar Adcox   [employer] high school  graduate [employer] second semester   Living at home with mom and has no car   Sleep 8+  hours   Seeing Dr. [name]- pulm at [facility] now has a new pulmonary Dr. Now stable and on prn       Thyroid evaluation in the past neck evaluation within normal limits and no thyroid disease.    Outpatient Medications Prior to Visit  Medication Sig Dispense Refill  . albuterol (PROVENTIL HFA;VENTOLIN HFA) 108 (90 BASE) MCG/ACT inhaler Inhale 2 puffs into the lungs every 6 (six) hours as needed for wheezing or shortness of breath. 1 Inhaler 4  . albuterol (PROVENTIL) (2.5 MG/3ML) 0.083% nebulizer solution Take 3 mLs (2.5 mg total) by nebulization every 4 (four) hours as needed for wheezing or shortness of breath. 75 mL 2  . fluticasone (FLONASE) 50 MCG/ACT nasal spray Place 2 sprays into both nostrils daily. 16 g 6  . fluticasone-salmeterol (ADVAIR HFA) 230-21 MCG/ACT inhaler Inhale 2 puffs into the lungs 2 (two) times daily.    . hydrOXYzine (ATARAX/VISTARIL) 10 MG tablet Take 1 tablet (10 mg total) by mouth daily as needed for itching. 30 tablet 0  . ipratropium (ATROVENT) 0.02 % nebulizer solution USE 1  VIAL BY NEBULIZATION 3 (THREE) TIMES DAILY AS NEEDED FOR WHEEZING. 62.5 mL 0  . levocetirizine (XYZAL ALLERGY 24HR) 5 MG tablet Take 1 tablet (5 mg total) by mouth every evening. 90 tablet 3  . methylphenidate 36 MG PO CR tablet TAKE (2) TABLETS DAILY. 60 tablet 0  . norgestimate-ethinyl estradiol (ORTHO-CYCLEN, 28,) 0.25-35 MG-MCG tablet Take 1 tablet by mouth daily. 1 Package 5  . omeprazole (PRILOSEC) 20 MG capsule Take 1 capsule (20 mg total) by mouth 2 (two) times daily before a meal. 60 capsule 3  . pantoprazole (PROTONIX) 40 MG tablet Take 1 tablet (40 mg total) by mouth daily. 30 tablet 0  . tiotropium (SPIRIVA) 18 MCG inhalation capsule Place 1 capsule (18 mcg total) into inhaler and inhale daily. 30 capsule 3  . triamcinolone cream (KENALOG) 0.5 % Apply topically 2 (two) times daily. Use as directed 30 g 3  . metFORMIN (GLUCOPHAGE-XR) 500 MG 24 hr tablet Take 1 tablet (500 mg total) by  mouth daily with breakfast. Or largest meal increae to 1 twice a day after 2 weeks 60 tablet 5   No facility-administered medications prior to visit.      EXAM:  BP 122/80 (BP Location: Right Arm, Patient Position: Sitting, Cuff Size: Large)   Pulse (!) 102   Temp 98.3 F (36.8 C) (Oral)   Wt 293 lb 8 oz (133.1 kg)   BMI 47.95 kg/m  Hr 90  And regular  Body mass index is 47.95 kg/m. Wt Readings from Last 3 Encounters:  06/09/17 293 lb 8 oz (133.1 kg)  03/30/17 296 lb 1.6 oz (134.3 kg)  02/25/17 (!) 300 lb 4.8 oz (136.2 kg)    GENERAL: vitals reviewed and listed above, alert, oriented, appears well hydrated and in no acute distress HEENT: atraumatic, conjunctiva  clear, no obvious abnormalities on inspection of external nose and ears OP : no lesion edema or exudate  NECK: no obvious masses on inspection palpation  LUNGS: clear to auscultation bilaterally, no wheezes, rales or rhonchi, good air movement CV: HRRR, no clubbing cyanosis or  peripheral edema nl cap refill  Abdomen:  Sof,t normal bowel sounds without hepatosplenomegaly, no guarding rebound or masses no CVA tenderness skin acanthosis dry skin eczema  MS: moves all extremities without noticeable focal  abnormality PSYCH: pleasant and cooperative,   ASSESSMENT AND PLAN:  Discussed the following assessment and plan:  Light headed - Plan: CBC with Differential/Platelet, Basic metabolic panel  Insulin resistance - Plan: CBC with Differential/Platelet, Basic metabolic panel  Severe obesity (BMI >= 40) (HCC) - Plan: CBC with Differential/Platelet, Basic metabolic panel  Prediabetes - Plan: CBC with Differential/Platelet, Basic metabolic panel  Medication management  Moderate intermittent asthma, uncomplicated Suspecting no food or significant calories before this happened and possible blood sugar related exam reassuring today check lab today anemia blood sugar plan follow-up in 1-2 months plan on A1c at that time. We  may need to increase the metformin. Weight loss  healrhy   To continue Counseled. Total visit 23mins > 50% spent counseling and coordinating care as indicated in above note and in instructions to patient .  -Patient advised to return or notify health care team  if symptoms worsen ,persist or new concerns arise.  Patient Instructions  This may be from  Not eating for a while .  Have small snack of protein and not sugars .  In the am .   Checking your sugar today .   Will refill the  metfromin  For you .   Keep follow appt .  1-2 mos    Standley Brooking. Kora Groom M.D.

## 2017-06-09 NOTE — Patient Instructions (Addendum)
This may be from  Not eating for a while .  Have small snack of protein and not sugars .  In the am .   Checking your sugar today .   Will refill the  metfromin  For you .   Keep follow appt .  1-2 mos

## 2017-06-10 LAB — BASIC METABOLIC PANEL
BUN: 9 mg/dL (ref 6–23)
CO2: 25 mEq/L (ref 19–32)
Calcium: 9.4 mg/dL (ref 8.4–10.5)
Chloride: 104 mEq/L (ref 96–112)
Creatinine, Ser: 1.23 mg/dL — ABNORMAL HIGH (ref 0.40–1.20)
GFR: 68.6 mL/min (ref >60.00)
Glucose, Bld: 92 mg/dL (ref 70–99)
Potassium: 4.2 mEq/L (ref 3.5–5.1)
Sodium: 139 mEq/L (ref 135–145)

## 2017-06-10 LAB — CBC WITH DIFFERENTIAL/PLATELET
Basophils Absolute: 0.1 10*3/uL (ref 0.0–0.1)
Basophils Relative: 1 % (ref 0.0–3.0)
Eosinophils Absolute: 0.3 10*3/uL (ref 0.0–0.7)
Eosinophils Relative: 4.3 % (ref 0.0–5.0)
HCT: 34.9 % — ABNORMAL LOW (ref 36.0–46.0)
Hemoglobin: 10.7 g/dL — ABNORMAL LOW (ref 12.0–15.0)
Lymphocytes Relative: 39 % (ref 12.0–46.0)
Lymphs Abs: 2.5 10*3/uL (ref 0.7–4.0)
MCHC: 30.7 g/dL (ref 30.0–36.0)
MCV: 66.7 fl — ABNORMAL LOW (ref 78.0–100.0)
Monocytes Absolute: 0.4 10*3/uL (ref 0.1–1.0)
Monocytes Relative: 6.4 % (ref 3.0–12.0)
Neutro Abs: 3.2 10*3/uL (ref 1.4–7.7)
Neutrophils Relative %: 49.3 % (ref 43.0–77.0)
Platelets: 472 10*3/uL — ABNORMAL HIGH (ref 150.0–400.0)
RBC: 5.23 Mil/uL — ABNORMAL HIGH (ref 3.87–5.11)
RDW: 18.3 % — ABNORMAL HIGH (ref 11.5–15.5)
WBC: 6.5 10*3/uL (ref 4.0–10.5)

## 2017-06-12 ENCOUNTER — Other Ambulatory Visit: Payer: Self-pay | Admitting: Emergency Medicine

## 2017-06-12 DIAGNOSIS — R7989 Other specified abnormal findings of blood chemistry: Secondary | ICD-10-CM

## 2017-06-12 DIAGNOSIS — D649 Anemia, unspecified: Secondary | ICD-10-CM

## 2017-06-18 ENCOUNTER — Other Ambulatory Visit (INDEPENDENT_AMBULATORY_CARE_PROVIDER_SITE_OTHER): Payer: Federal, State, Local not specified - PPO

## 2017-06-18 DIAGNOSIS — D649 Anemia, unspecified: Secondary | ICD-10-CM | POA: Diagnosis not present

## 2017-06-18 DIAGNOSIS — R7989 Other specified abnormal findings of blood chemistry: Secondary | ICD-10-CM | POA: Diagnosis not present

## 2017-06-18 LAB — IBC PANEL
Iron: 17 ug/dL — ABNORMAL LOW (ref 42–145)
Saturation Ratios: 4.3 % — ABNORMAL LOW (ref 20.0–50.0)
Transferrin: 284 mg/dL (ref 212.0–360.0)

## 2017-06-18 LAB — BASIC METABOLIC PANEL WITH GFR
BUN: 11 mg/dL (ref 6–23)
CO2: 27 meq/L (ref 19–32)
Calcium: 9.2 mg/dL (ref 8.4–10.5)
Chloride: 105 meq/L (ref 96–112)
Creatinine, Ser: 0.84 mg/dL (ref 0.40–1.20)
GFR: 106.5 mL/min
Glucose, Bld: 94 mg/dL (ref 70–99)
Potassium: 4.4 meq/L (ref 3.5–5.1)
Sodium: 139 meq/L (ref 135–145)

## 2017-06-18 LAB — FERRITIN: Ferritin: 10.1 ng/mL (ref 10.0–291.0)

## 2017-06-18 NOTE — Progress Notes (Unsigned)
pr

## 2017-06-19 LAB — PROTEIN, URINE, RANDOM: Total Protein, Urine: 6 mg/dL (ref 5–24)

## 2017-06-19 LAB — PROTEIN / CREATININE RATIO, URINE
Creatinine, Urine: 155 mg/dL (ref 20–320)
Protein Creatinine Ratio: 39 mg/g creat (ref 21–161)
Total Protein, Urine: 6 mg/dL (ref 5–24)

## 2017-06-23 ENCOUNTER — Ambulatory Visit (INDEPENDENT_AMBULATORY_CARE_PROVIDER_SITE_OTHER): Payer: Federal, State, Local not specified - PPO | Admitting: Sports Medicine

## 2017-06-23 ENCOUNTER — Ambulatory Visit (INDEPENDENT_AMBULATORY_CARE_PROVIDER_SITE_OTHER): Payer: Federal, State, Local not specified - PPO

## 2017-06-23 ENCOUNTER — Encounter: Payer: Self-pay | Admitting: Sports Medicine

## 2017-06-23 ENCOUNTER — Ambulatory Visit: Payer: Self-pay

## 2017-06-23 VITALS — BP 112/80 | HR 88 | Ht 65.6 in | Wt 290.8 lb

## 2017-06-23 DIAGNOSIS — S99912A Unspecified injury of left ankle, initial encounter: Secondary | ICD-10-CM | POA: Diagnosis not present

## 2017-06-23 DIAGNOSIS — M25572 Pain in left ankle and joints of left foot: Secondary | ICD-10-CM

## 2017-06-23 NOTE — Procedures (Signed)
PROCEDURE NOTE -  ULTRASOUND GUIDEDINJECTION: Left ankle Images were obtained and interpreted by myself, Teresa Coombs, DO  Images have been saved and stored to PACS system. Images obtained on: GE S7 Ultrasound machine  ULTRASOUND FINDINGS: Generalized synovitis that is mild.  There is a slight double layer sign of cartilage consistent with possible chondrocalcinosis.  DESCRIPTION OF PROCEDURE:  The patient's clinical condition is marked by substantial pain and/or significant functional disability. Other conservative therapy has not provided relief, is contraindicated, or not appropriate. There is a reasonable likelihood that injection will significantly improve the patient's pain and/or functional impairment. After discussing the risks, benefits and expected outcomes of the injection and all questions were reviewed and answered, the patient wished to undergo the above named procedure. Verbal consent was obtained. The ultrasound was used to identify the target structure and adjacent neurovascular structures. The skin was then prepped in sterile fashion and the target structure was injected under direct visualization using sterile technique as below: PREP: Alcohol, Ethel Chloride APPROACH: direct inplane, single injection, 25g 1.5" needle INJECTATE:  1cc 0.5% marcaine, 1 cc 40mg  DepoMedrol ASPIRATE: N/A DRESSING: Band-Aid & lace up ASO  Post procedural instructions including recommending icing and warning signs for infection were reviewed. This procedure was well tolerated and there were no complications.   IMPRESSION: Succesful US Guided Injection

## 2017-06-23 NOTE — Patient Instructions (Signed)

## 2017-06-23 NOTE — Progress Notes (Signed)
OFFICE VISIT NOTE Glenda Mendez. [place], [place] at [facility] [phone]  Meliana EVANGELYNN LOCHRIDGE - 26 y.o. female MRN [id number]  Date of birth: 1991/06/10  Visit Date: 06/23/2017  PCP: Burnis Medin, MD   Referred by: Burnis Medin, MD  Burlene Arnt, CMA acting as scribe for Dr. Paulla Fore.  SUBJECTIVE:   Chief Complaint  Patient presents with  . New Patient (Initial Visit)    LT ankle pain   HPI: As below and per problem based documentation when appropriate.  [place] is a new patient presenting today with complaint of LT ankle pain.  She reports that her LT leg "went weak" when she was trying to get out of bed this morning and caused her to fall on the floor.  She reports that she felt something pop on the lateral aspect of her ankle and he ankle started to swell. She did apply ice after the incident.   The pain is described as throbbing and is rated as 6/10 when bearing weight.  Worsened with walking, bearing weight.  Nothing seems to alleviate the pain.  Therapies tried include : icing the ankle. She has not tried any OTC meds for the pain or swelling.   Other associated symptoms include: She reports hx of injury, sprain, to the LT ankle 07/2016.     Review of Systems  Constitutional: Negative for chills and fever.  Respiratory: Negative for shortness of breath and wheezing.   Cardiovascular: Negative for chest pain and palpitations.  Musculoskeletal: Positive for falls and joint pain.  Neurological: Positive for tingling. Negative for dizziness and headaches.  Endo/Heme/Allergies: Does not bruise/bleed easily.    Otherwise per HPI.  HISTORY & PERTINENT PRIOR DATA:  No specialty comments available. She reports that she has never smoked. She has never used smokeless tobacco.   Recent Labs  06/26/16 1017 03/23/17 0944  HGBA1C 6.1 6.6*   Medications & Allergies reviewed per EMR Patient Active Problem List   Diagnosis Date  Noted  . Acute left ankle pain 06/23/2017  . Ganglion cyst of flexor tendon sheath of finger of right hand 02/02/2017  . Trigger finger, right middle finger 02/02/2017  . Eczema 07/11/2016  . Pruritus 06/26/2016  . Dyspnea and respiratory abnormality 05/08/2016  . Wheezing 05/08/2016  . Elevated testosterone level in female 08/28/2015  . Elevated hemoglobin A1c 08/28/2015  . Abdominal pain, right upper quadrant 06/05/2015  . Abnormal urine 06/05/2015  . Abnormal alkaline phosphatase test 04/27/2015  . Severe obesity (BMI >= 40) ([place]) 12/19/2014  . Asthma with acute exacerbation 08/29/2014  . Moderate intermittent asthma 06/20/2014  . Rash, skin 06/20/2014  . Moderate persistent asthma with acute exacerbation in adult 05/27/2014  . Anterior chest wall pain 05/27/2014  . Low grade squamous intraepithelial lesion (LGSIL) on cervical Pap smear 12/02/2013  . Obesity (BMI 30-39.9) 11/28/2013  . Visit for preventive health examination 11/28/2013  . Routine gynecological examination 11/28/2013  . Weight gain 11/28/2013  . Asthma 11/28/2013  . Moderate persistent asthma without complication 64/40/3474  . Hemiparesis ([place])   . Oral contraceptive pill surveillance 04/25/2012  . Headache(784.0) 06/04/2011  . Irregular periods 06/04/2011  . Acne 06/04/2011  . BCP (birth control pills) initiation 06/04/2011  . Preventative health care 02/18/2011  . Neoplasm of uncertain behavior of skin 02/18/2011  . BMI (body mass index), pediatric, 95-99% for age 26/10/2010  . BACK PAIN 09/17/2010  . NECK PAIN 01/02/2009  . FATIGUE 12/06/2008  .  Attention deficit hyperactivity disorder (ADHD) 09/03/2007  . UNSPECIFIED HEARING LOSS 09/03/2007  . ALLERGIC RHINITIS 05/19/2007  . ASTHMA 05/19/2007   Past Medical History:  Diagnosis Date  . ADHD (attention deficit hyperactivity disorder)   . Allergy   . Asthma   . BMI (body mass index), pediatric, 95-99% for age 38/10/2010   Is starting to gain weight  again and advised and counseled today about reduction for health risk reasons. Patient is very well aware of how to do lifestyle intervention she has done this before.   Marland Kitchen Hearing loss in right ear    with hearing aid bilateral   . Hemiparesis ([place])    rt from neonatal period  . Hypoxia of newborn   . Irregular periods 06/04/2011  . PCOS (polycystic ovarian syndrome)   . Pulmonary hemorrhage of fetus or newborn    51 week C-section [facility] of with birth hypoxia   Family History  Problem Relation Age of Onset  . Adopted: Yes  . Hypertension Unknown   . Schizophrenia Mother        with alcohol drug abuse  . Diabetes Other    Past Surgical History:  Procedure Laterality Date  . TONSILLECTOMY     Social History   Occupational History  . Not on file.   Social History Main Topics  . Smoking status: Never Smoker  . Smokeless tobacco: Never Used  . Alcohol use No  . Drug use: No  . Sexual activity: No    OBJECTIVE:  VS:  HT:5' 5.6" (166.6 cm)   WT:290 lb 12.8 oz (131.9 kg)  BMI:47.52    BP:112/80  HR:88bpm  TEMP: ( )  RESP:98 % EXAM: No additional findings.    [place] Limited Joint Space Structures Low Left(no Linked Charges)  Result Date: 06/23/2017 Gerda Diss, DO     06/23/2017 12:21 PM PROCEDURE NOTE -  ULTRASOUND GUIDEDINJECTION: Left ankle Images were obtained and interpreted by myself, Teresa Coombs, DO Images have been saved and stored to PACS system. Images obtained on: GE S7 Ultrasound machine  ULTRASOUND FINDINGS: Generalized synovitis that is mild.  There is a slight double layer sign of cartilage consistent with possible chondrocalcinosis. DESCRIPTION OF PROCEDURE: The patient's clinical condition is marked by substantial pain and/or significant functional disability. Other conservative therapy has not provided relief, is contraindicated, or not appropriate. There is a reasonable likelihood that injection will significantly improve the patient's pain and/or functional  impairment. After discussing the risks, benefits and expected outcomes of the injection and all questions were reviewed and answered, the patient wished to undergo the above named procedure. Verbal consent was obtained. The ultrasound was used to identify the target structure and adjacent neurovascular structures. The skin was then prepped in sterile fashion and the target structure was injected under direct visualization using sterile technique as below: PREP: Alcohol, Ethel Chloride APPROACH: direct inplane, single injection, 25g 1.5" needle INJECTATE:  1cc 0.5% marcaine, 1 cc 40mg  DepoMedrol ASPIRATE: N/A DRESSING: Band-Aid & lace up ASO Post procedural instructions including recommending icing and warning signs for infection were reviewed. This procedure was well tolerated and there were no complications.  IMPRESSION: Succesful US Guided Injection    ASSESSMENT & PLAN:     ICD-10-CM   1. Acute left ankle pain M25.572 DG Ankle Complete Left    [place] LIMITED JOINT SPACE STRUCTURES LOW LEFT(NO LINKED CHARGES)  ================================================================= Acute left ankle pain Patient with acute onset of ankle pain with no eliciting injury.  Concern for  potential crystal arthropathy.  Injection performed today.  Follow-up in 4 weeks for consideration of lab work analysis but will defer at this point given false-negative for gout during acute flare.  We will plan to have her use an ASO for the next several weeks as needed and will keep her out of work today.  At follow-up can consider referral to physical therapy if persistent symptoms. =================================================================  Follow-up: Return in about 4 weeks (around 07/21/2017).   CMA/ATC served as Education administrator during this visit. History, Physical, and Plan performed by medical provider. Documentation and orders reviewed and attested to.      Teresa Coombs, [place] [facility] Physician

## 2017-06-23 NOTE — Assessment & Plan Note (Signed)
Patient with acute onset of ankle pain with no eliciting injury.  Concern for potential crystal arthropathy.  Injection performed today.  Follow-up in 4 weeks for consideration of lab work analysis but will defer at this point given false-negative for gout during acute flare.  We will plan to have her use an ASO for the next several weeks as needed and will keep her out of work today.  At follow-up can consider referral to physical therapy if persistent symptoms.

## 2017-06-24 ENCOUNTER — Ambulatory Visit (INDEPENDENT_AMBULATORY_CARE_PROVIDER_SITE_OTHER): Payer: Federal, State, Local not specified - PPO | Admitting: Internal Medicine

## 2017-06-24 ENCOUNTER — Encounter: Payer: Self-pay | Admitting: Internal Medicine

## 2017-06-24 ENCOUNTER — Other Ambulatory Visit: Payer: Federal, State, Local not specified - PPO

## 2017-06-24 VITALS — BP 130/78 | HR 87 | Ht 67.0 in | Wt 286.4 lb

## 2017-06-24 DIAGNOSIS — J4520 Mild intermittent asthma, uncomplicated: Secondary | ICD-10-CM

## 2017-06-24 DIAGNOSIS — IMO0001 Reserved for inherently not codable concepts without codable children: Secondary | ICD-10-CM

## 2017-06-24 DIAGNOSIS — Z23 Encounter for immunization: Secondary | ICD-10-CM

## 2017-06-24 NOTE — Patient Instructions (Addendum)
ICD-10-CM   1. Moderate intermittent asthma, uncomplicated Y77.41     Currently asthma stable  On spiriva Glad you remain off prednisone Fatigue could be due to physical deconditioning  Plan Flu shot 06/24/2017  Talk to PCP Panosh, Standley Brooking, MD about tiredness  Continue spiriva and advair scheduled Continue albuterol as needed Check blood IgE and allergy profile 06/24/2017 - if this is normal will reduce avair/stop - will call with results  Followup 6-9 months or sooner if needed

## 2017-06-24 NOTE — Progress Notes (Signed)
Subjective:     Patient ID: Glenda Mendez, female   DOB: October 11, 1991, 26 y.o.   MRN: [id number]  HPI  HPI  Chief Complaint  Patient presents with  . Acute Visit    MR pt. pt states she had an asthma attack this morning, EMS was called. pt states breathing improved with 2 neb treatment . pt c/o sob, non prod cough, wheezing & chest tightness.   26 year old obese woman with asthma since childhood.  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 She is maintained on a regimen of Advair, Spiriva was taken off, and Flonase for allergies. She used to follow up with [facility] and had a detailed evaluation. Lung function has been noted to be normal. There has been some concern for vocal cord dysfunction. Sleep studies have been reported normal.  She is adopted. Her mom reports triggers being changes in weather in barometric pressure. She developed sudden onset chest tightness and wheezing around 11 AM-took nebulizer treatment every 3 hours 2, but wheezing is persistent then EMS was called, she was given 2 more nebulizer treatments with improvement-mother also given 40 mg of prednisone based on action plan laid out by [facility]  She feels much improved and is back to normal. Mother feels that she has nasal congestion  She also has lifelong eczema and itching and requests a refill on Vistaril   OV 09/26/2016  Chief Complaint  Patient presents with  . Follow-up    Pt c/o increase in SOB, sinus congestion and blowing out yellow mucus with blood and c/o sore throat. Pt deneis CP/tightness, f/c/s.   IOV 05/08/2016  Chief Complaint  Patient presents with  . Pulnonary Consult    Ref. by Dr. Rudi Coco since 1993. Since 2015-multiple er visits and ems with increased sob and chest tightness.Blue color at times during these episodes.On Prednisone (pulmonary md at [facility]-Dr. Everlean Alstrom md left) on 5 wks. now10 mg daily.no wheezing.   26 year old obese  F American female brought in by her adopted mother. Patient is to be followed at [facility] for asthma. But the doctor there has left. Therefore she is new consultation here and transfer of care. 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 However in her entire life EMS was never called for asthma but in May 2015 in May 2016 this happened. Then in 2016 though multiple episodes of requiring prednisone at least 4 times. Then in the fall of 2016 at [facility] they did blood allergy panel, IgE and complete blood count this was normal except for years and feels that was slightly elevated at 100 cells per cubic millimeter. Patient also mother does not recollect having had an exhaled nitric oxide. At this point in time she was only on Spiriva and Advair. They reported that at that point in time. Tried Singulair for many years but it was not working so there will not taking it. It appears from talking to the family and review of the chart Nucala was considered and patient paid out of pocket for it and took it for 3 months in the early part of 2017 and felt significant relief. However by March 2017 she did not have money to for this out of pocket. Then starting May 2017 through June 2017 multiple EMS calls and requiring prednisone repeatedly that one month. Then in June 2017 Dr.  lugogo started chronic daily prednisone till she saw me this visit. Patient's currently taking 10 mg per day. According to the patient on the mom this is helped her significantly and she is feeling much improved on the combination of prednisone Spiriva and Advair. Nevertheless asthma control questionnaire shows a score of 2 suggesting significant active symptoms. At night she hardly ever wakes up because of asthma. When she wakes up she has moderate symptoms. She is limited extremely with her daily activities because of  asthma and she has a little shortness of breath she is not wheezing or using albuterol for rescue this past week.  Of note only function tests is always been normal at [facility]. Blood allergy panel has been normal IgE has been normal complete blood count has been normal.  This no history of CT chest with CT sinus a sleep apnea workup in the last few years. Patient does not recollect having had an echocardiogram.       Asthma related data  This was 6 2001: Upper endoscopy biopsy showed gastroesophageal reflux  10/07/2003 as a child she had sleep study which is reported as normal at [facility]  12/30/2007 she had CT neck and CT brain: She showed no masses or lymphadenopathy in the neck but had long segment high-grade narrowing of the right common carotid with reconstitution of the bifurcation. But normal range  01/23/2014 pulmonary function test at [facility] 4.09L//97%, FEV1 2.44 L/95% ratio of 87. Total lung capacity and DLCO not available. Off note she also had normal spirometry and total lung capacity in 2014 and 2013  07/16/2015: Blood allergy panel IgE was negative including Aspergillus fumigatus. She had eosinophils of 3.1% with absolute eosinophil count of 260. Review of her use of the count in our chart shows eosinophils just over 200 cells per cubic millimeter for many years. She had a blood IgE of 132 [in 2005 it was normal in the 30s] which is within normal range at [facility]   has a past medical history of Asthma; Pulmonary hemorrhage of fetus or newborn; Hypoxia of newborn; Hearing loss in right ear; Hemiparesis ([place]); Allergy; ADHD (attention deficit hyperactivity disorder); Irregular periods (06/04/2011); BMI (body mass index), pediatric, 95-99% for age (02/18/2011); and PCOS (polycystic ovarian syndrome).Let Glenda Mendez 1991-06-25  Know that CT sinus and CT chest and PFT all normal   OV 05/15/2016  Chief Complaint  Patient presents with  .  Follow-up    review echo, ct, and pft.  pt has no change in breathing since last OV.       Ct Chest High Resolution  Result Date: 05/13/2016 CLINICAL DATA:  26 year old female with history of chronic shortness of breath. Evaluate for potential interstitial lung disease. EXAM: CT CHEST WITHOUT CONTRAST TECHNIQUE: Multidetector CT imaging of the chest was performed following the standard protocol without intravenous contrast. High resolution imaging of the lungs, as well as inspiratory and expiratory imaging, was performed. COMPARISON:  No priors. FINDINGS: Cardiovascular: Heart size is normal. There is no significant pericardial fluid, thickening or pericardial calcification. No atherosclerotic calcifications are noted in the thoracic aorta or the coronary arteries. Mediastinum/Nodes: No pathologically enlarged mediastinal or hilar lymph nodes. Please note that accurate exclusion of hilar adenopathy is limited on noncontrast CT scans. Esophagus is unremarkable in appearance. No axillary lymphadenopathy. Lungs/Pleura: High-resolution images demonstrate no significant regions of ground-glass attenuation, subpleural reticulation, parenchymal banding, traction bronchiectasis or frank honeycombing to indicate interstitial lung disease. Inspiratory and expiratory imaging demonstrates  some mild air trapping, indicative of mild small airways disease. No acute consolidative airspace disease. No pleural effusions. No suspicious appearing pulmonary nodules or masses. Upper Abdomen: Unremarkable. Musculoskeletal: There are no aggressive appearing lytic or blastic lesions noted in the visualized portions of the skeleton. IMPRESSION: 1. No findings to suggest interstitial lung disease. 2. Mild air trapping, indicative of mild small airways disease. 3. No acute findings in the thorax. Electronically Signed   By: [name] M.D.   On: 05/13/2016 09:59  Ct Maxillofacial Ltd Wo Cm  Result Date: 05/12/2016 CLINICAL  DATA:  Asthma. Chronic sinus infections with interstitial lung disease. EXAM: CT PARANASAL SINUS LIMITED WITHOUT CONTRAST TECHNIQUE: Non-contiguous multidetector CT images of the paranasal sinuses were obtained in a single plane without contrast. COMPARISON:  None. FINDINGS: The paranasal sinuses are well aerated. There is no mucoperiosteal thickening or air-fluid levels. The visualized mastoid air cells and middle ears are clear. No orbital abnormalities are seen. The visualized intracranial contents are unremarkable. IMPRESSION: Negative limited paranasal sinus CT. No evidence of active sinus disease. Electronically Signed   By: [name] M.D.   On: 05/12/2016 19:51    Echocardiogram 05/14/2016: Normal   OV 06/12/2016  Chief Complaint  Patient presents with  . Follow-up    Pt here after CPST. Pt denies change in SOB and cough. Pt states she has dry skin under her left eye.    Follow-up history of asthma with dyspnea. Here to review cardiopulmonary stress test results. This was done 05/19/2016 and I reviewed it. Obesity and diastolic dysfunction are major cause of dyspnea. Exercise induced bronchospasm response was borderline while she was on Advair and 5 mg daily prednisone. However today she is in stable health and exhaled nitric oxide while on the same Advair and prednisone is:  [place] - 6ppb. She and her stepmom somewhat reluctant to accept the findings of obesity and diastolic dysfunction. Mom's concern is why patient exacerbates every May 2017. But she is agreed to follow this. They're in agreement that prednisone needs to be tapered off as soon as possible. She was started on long-term prednisone in May 2017.   OV 09/26/2016  Chief Complaint  Patient presents with  . Follow-up    Pt c/o increase in SOB, sinus congestion and blowing out yellow mucus with blood and c/o sore throat. Pt deneis CP/tightness, f/c/s.      Follow-up chronic dyspnea due to obesity and diastolic  dysfunction. Has a history of asthma but is consistently had normal exhaled nitric oxide  Been able to get her off daily prednisone he did she is tolerating that fine. She's currently on Advair and Spiriva. She says that asthma is under control. No interim albuterol use. No nocturnal awakenings. No wheeze. Albuterol use is rare. For the last day or so she's had runny nose with sinus congestion with yellow discharge. No fever but has mild malaise   OV 06/24/2017  Chief Complaint  Patient presents with  . Follow-up    Pt. was last seen for acute rhinitis. Pt states that she is doing better since last visit but not back to normal baseline.    Follow-up dyspnea due to obesity and diastolic dysfunction. Has history of asthma previously on prednisone. Currently only on adavir. . She is consistently normal exhaled nitric oxide  Last visit December 2017. Since then she tells me that in February 2018 she had an asthma exacerbation treated with prednisone. Most recently a month ago she has had some I  deficiency anemia she is on iron tablets. 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 She gets tired after day's work and wants to understand why. Of morbid obesity still continues. She is using advair regularly. In the last one month it is normal albuterol rescue use or my time awakenings or chest tightness or wheezing. Asthma is felt to be under good control. Review of the chart shows that she's not had any allergy test IgE tests s  Ast herehma Control Panel 05/08/2016  06/12/16 07/11/16   Spiriva, Advair, daily prednisone since June   Spiriva and Advair   ACQ 5 point- 1 week. wtd avg score. <1.0 is good control 0.75-1.25 is grey zone. >1.25 poor control. Delta 0.5 is clinically meaningful 2    FeNO ppB 7ppbDone while on chronic steroids and normal  6ppb 8  FeV1      Planned intervention  for visit       Results for KANDACE, ELROD (MRN [id number]) as of 06/24/2017 10:25   Ref. Range 03/23/2017 09:44 06/09/2017 16:10  Eosinophils Absolute Latest Ref Range: 0.0 - 0.7 K/uL 0.2 0.3     has a past medical history of ADHD (attention deficit hyperactivity disorder); Allergy; Asthma; BMI (body mass index), pediatric, 95-99% for age (02/18/2011); Hearing loss in right ear; Hemiparesis ([place]); Hypoxia of newborn; Irregular periods (06/04/2011); PCOS (polycystic ovarian syndrome); and Pulmonary hemorrhage of fetus or newborn.   reports that she has never smoked. She has never used smokeless tobacco.  Past Surgical History:  Procedure Laterality Date  . TONSILLECTOMY      No Known Allergies  Immunization History  Administered Date(s) Administered  . H1N1 11/03/2008  . Hepatitis A 11/26/2007, 11/20/2009  . Hpv 10/28/2006, 11/26/2007, 11/20/2009  . Influenza Split 07/29/2011, 07/01/2012  . Influenza Whole 08/31/2007, 08/02/2008, 06/18/2010  . Influenza,inj,Quad PF,6+ Mos 08/05/2013, 06/20/2014, 06/12/2015, 06/26/2016  . Meningococcal Polysaccharide 11/26/2007  . Pneumococcal Polysaccharide-23 11/27/2015  . Td 02/25/2017  . Varicella 11/26/2007    Family History  Problem Relation Age of Onset  . Adopted: Yes  . Hypertension Unknown   . Schizophrenia Mother        with alcohol drug abuse  . Diabetes Other      Current Outpatient Prescriptions:  .  albuterol (PROVENTIL HFA;VENTOLIN HFA) 108 (90 BASE) MCG/ACT inhaler, Inhale 2 puffs into the lungs every 6 (six) hours as needed for wheezing or shortness of breath., Disp: 1 Inhaler, Rfl: 4 .  albuterol (PROVENTIL) (2.5 MG/3ML) 0.083% nebulizer solution, Take 3 mLs (2.5 mg total) by nebulization every 4 (four) hours as needed for wheezing or shortness of breath., Disp: 75 mL, Rfl: 2 .  fluticasone (FLONASE) 50 MCG/ACT nasal spray, Place 2 sprays into both nostrils daily., Disp: 16 g, Rfl: 6 .  fluticasone-salmeterol (ADVAIR HFA) 230-21 MCG/ACT inhaler, Inhale 2 puffs into the lungs 2 (two) times daily., Disp: , Rfl:   .  hydrOXYzine (ATARAX/VISTARIL) 10 MG tablet, Take 1 tablet (10 mg total) by mouth daily as needed for itching., Disp: 30 tablet, Rfl: 0 .  ipratropium (ATROVENT) 0.02 % nebulizer solution, USE 1 VIAL BY NEBULIZATION 3 (THREE) TIMES DAILY AS NEEDED FOR WHEEZING., Disp: 62.5 mL, Rfl: 0 .  levocetirizine (XYZAL ALLERGY 24HR) 5 MG tablet, Take 1 tablet (5 mg total) by mouth every evening., Disp: 90 tablet, Rfl: 3 .  metFORMIN (GLUCOPHAGE-XR) 500 MG 24 hr tablet, Take 1 tablet (500 mg total) by mouth daily with  breakfast. Or largest meal increae to 1 twice a day after 2 weeks, Disp: 60 tablet, Rfl: 5 .  methylphenidate 36 MG PO CR tablet, TAKE (2) TABLETS DAILY., Disp: 60 tablet, Rfl: 0 .  norgestimate-ethinyl estradiol (ORTHO-CYCLEN, 28,) 0.25-35 MG-MCG tablet, Take 1 tablet by mouth daily., Disp: 1 Package, Rfl: 5 .  omeprazole (PRILOSEC) 20 MG capsule, Take 1 capsule (20 mg total) by mouth 2 (two) times daily before a meal., Disp: 60 capsule, Rfl: 3 .  pantoprazole (PROTONIX) 40 MG tablet, Take 1 tablet (40 mg total) by mouth daily., Disp: 30 tablet, Rfl: 0 .  tiotropium (SPIRIVA) 18 MCG inhalation capsule, Place 1 capsule (18 mcg total) into inhaler and inhale daily., Disp: 30 capsule, Rfl: 3 .  triamcinolone cream (KENALOG) 0.5 %, Apply topically 2 (two) times daily. Use as directed, Disp: 30 g, Rfl: 3   Review of Systems     Objective:   Physical Exam  Constitutional: She is oriented to person, place, and time. She appears well-developed and well-nourished. No distress.  obese  HENT:  Head: Normocephalic and atraumatic.  Right Ear: External ear normal.  Left Ear: External ear normal.  Mouth/Throat: Oropharynx is clear and moist. No oropharyngeal exudate.  Eyes: Pupils are equal, round, and reactive to light. Conjunctivae and EOM are normal. Right eye exhibits no discharge. Left eye exhibits no discharge. No scleral icterus.  Neck: Normal range of motion. Neck supple. No JVD present. No  tracheal deviation present. No thyromegaly present.  Cardiovascular: Normal rate, regular rhythm, normal heart sounds and intact distal pulses.  Exam reveals no gallop and no friction rub.   No murmur heard. Pulmonary/Chest: Effort normal and breath sounds normal. No respiratory distress. She has no wheezes. She has no rales. She exhibits no tenderness.  Abdominal: Soft. Bowel sounds are normal. She exhibits no distension and no mass. There is no tenderness. There is no rebound and no guarding.  Musculoskeletal: Normal range of motion. She exhibits no edema or tenderness.  Lymphadenopathy:    She has no cervical adenopathy.  Neurological: She is alert and oriented to person, place, and time. She has normal reflexes. No cranial nerve deficit. She exhibits normal muscle tone. Coordination normal.  Skin: Skin is warm and dry. No rash noted. She is not diaphoretic. No erythema. No pallor.  Psychiatric: She has a normal mood and affect. Her behavior is normal. Judgment and thought content normal.  Vitals reviewed.  Vitals:   06/24/17 1003  BP: 130/78  Pulse: 87  SpO2: 98%  Weight: 286 lb 6 oz (129.9 kg)  Height: 5\' 7"  (1.702 m)    Estimated body mass index is 44.85 kg/m as calculated from the following:   Height as of this encounter: 5\' 7"  (1.702 m).   Weight as of this encounter: 286 lb 6 oz (129.9 kg).     Assessment:       ICD-10-CM   1. Moderate intermittent asthma, uncomplicated O03.70        Plan:      Currently asthma stable on advair Glad you remain off prednisone Fatigue could be due to physical deconditioning  Plan Flu shot 06/24/2017  Talk to PCP Panosh, Standley Brooking, MD about tiredness  Continue spiriva and advair scheduled Continue albuterol as needed Check blood IgE and allergy profile 06/24/2017 - if this is normal will reduce advair Followup 6-9 months or sooner if needed   Dr. Brand Males, M.D., [facility].C.P Pulmonary and Critical Care Medicine Staff  Physician [facility] Pager: [phone], If no answer or between  15:00h - 7:00h: call [phone]  06/24/2017 10:28 AM

## 2017-06-25 LAB — RESPIRATORY ALLERGY PROFILE REGION II ~~LOC~~

## 2017-06-25 LAB — INTERPRETATION:

## 2017-07-08 ENCOUNTER — Telehealth: Payer: Self-pay | Admitting: Internal Medicine

## 2017-07-08 NOTE — Telephone Encounter (Signed)
Blood allergy and IgE negative - recommend that she dc spiriva and redue advair to 1 puff twice daily on HFA  rov  6 months  And if doing well can look at reducing advair further  Dr. Brand Males, M.D., [facility].C.P Pulmonary and Critical Care Medicine Staff Physician [place] [facility] Pager: [phone], If no answer or between  15:00h - 7:00h: call [phone]  07/08/2017 1:37 PM

## 2017-07-08 NOTE — Telephone Encounter (Signed)
Called and spoke with pt regarding her labwork and the recommendations that was made by MR. Told pt we would need her to come back in 6 months for an ROV. Pt expressed understanding. Nothing further needed.

## 2017-07-10 ENCOUNTER — Encounter: Payer: Self-pay | Admitting: Internal Medicine

## 2017-07-21 ENCOUNTER — Ambulatory Visit: Payer: Federal, State, Local not specified - PPO | Admitting: Sports Medicine

## 2017-07-23 ENCOUNTER — Encounter: Payer: Self-pay | Admitting: Sports Medicine

## 2017-07-23 ENCOUNTER — Ambulatory Visit (INDEPENDENT_AMBULATORY_CARE_PROVIDER_SITE_OTHER): Payer: Federal, State, Local not specified - PPO | Admitting: Sports Medicine

## 2017-07-23 VITALS — BP 110/80 | HR 90 | Ht 65.6 in | Wt 290.8 lb

## 2017-07-23 DIAGNOSIS — M25572 Pain in left ankle and joints of left foot: Secondary | ICD-10-CM | POA: Diagnosis not present

## 2017-07-23 MED ORDER — NAPROXEN-ESOMEPRAZOLE 500-20 MG PO TBEC: 1.0000 | DELAYED_RELEASE_TABLET | Freq: Two times a day (BID) | ORAL | 2 refills | Status: DC

## 2017-07-23 MED ORDER — NAPROXEN-ESOMEPRAZOLE 500-20 MG PO TBEC: 1.0000 | DELAYED_RELEASE_TABLET | Freq: Two times a day (BID) | ORAL | 0 refills | Status: AC

## 2017-07-23 NOTE — Progress Notes (Signed)
OFFICE VISIT NOTE Glenda Mendez, [place] at [facility] [phone]  Glenda Mendez - 26 y.o. female MRN [id number]  Date of birth: 1991-04-07  Visit Date: 07/23/2017  PCP: Burnis Medin, MD   Referred by: Burnis Medin, MD  Burlene Arnt, CMA acting as scribe for Dr. Paulla Fore.  SUBJECTIVE:   Chief Complaint  Patient presents with  . Follow-up    LT ankle pain   HPI: As below and per problem based documentation when appropriate.  Glenda Mendez is an established patient presenting today in follow-up of LT ankle pain. She was last seen 06/23/2017 and had xray of the LT ankle. She received steroid injection and was provided with and ASO lace up brace.   Pt reports improved in pain after receiving steroid injection. She does still have some instability. She feels that the pain and swelling is worse when the weather is bad, raining. She has been wearing her brace while ambulating. She has been taking Ibuprofen with some relief. She still has some pain after being on her feet for long periods of time. She feels occasional popping sensation.     Review of Systems  Constitutional: Negative.   Respiratory: Negative.   Cardiovascular: Negative.   Neurological: Positive for dizziness, tingling and headaches.    Otherwise per HPI.   HISTORY & PERTINENT PRIOR DATA:  Prior History reviewed and updated per electronic medical record.  Significant history, findings, studies and interim changes include:  reports that  has never smoked. she has never used smokeless tobacco. Recent Labs    03/23/17 0944  HGBA1C 6.6*   No specialty comments available. Problem  Left Ankle Pain   Injected 06/23/17      OBJECTIVE:  VS:  HT:5' 5.6" (166.6 cm)   WT:290 lb 12.8 oz (131.9 kg)  BMI:47.52    BP:110/80  HR:90bpm  TEMP: ( )  RESP:96 %  PHYSICAL EXAM: Constitutional: WDWN, Non-toxic appearing. Psychiatric: Alert & appropriately interactive.  Not depressed or anxious appearing. Respiratory: No increased work of breathing. Trachea Midline Eyes: Pupils are equal. EOM intact without nystagmus. No scleral icterus  Left foot and ankle: Overall clinic flatfoot with loss of longitudinal and transverse arches.  She has pain with resisted dorsiflexion to more than 95 degrees.  DP and PT pulses are 2+/4.  Ankle drawer testing is stable but painful.  Mild pain with subtalar motion.  Only mild generalized swelling.   ASSESSMENT & PLAN:   1. Acute left ankle pain    PLAN:   Left ankle pain Given persistent pain and swelling lack of improvement with injection further diagnostic evaluation with MRI indicated at this time.    Vimovo prescription provided   ++++++++++++++++++++++++++++++++++++++++++++ Orders & Meds: Orders Placed This Encounter  Procedures  . MR ANKLE LEFT WO CONTRAST    Meds ordered this encounter  Medications  . Naproxen-Esomeprazole (VIMOVO) 500-20 MG TBEC    Sig: Take 1 tablet by mouth 2 (two) times daily.    Dispense:  6 tablet    Refill:  0  . Naproxen-Esomeprazole (VIMOVO) 500-20 MG TBEC    Sig: Take 1 tablet by mouth 2 (two) times daily.    Dispense:  60 tablet    Refill:  2    Home Phone      [phone] Mobile          [phone]     ++++++++++++++++++++++++++++++++++++++++++++ Follow-up: Return for MRI review.   Pertinent documentation  may be included in additional procedure notes, imaging studies, problem based documentation and patient instructions. Please see these sections of the encounter for additional information regarding this visit. CMA/ATC served as Education administrator during this visit. History, Physical, and Plan performed by medical provider. Documentation and orders reviewed and attested to.      Gerda Diss, [place] [facility] Physician

## 2017-07-23 NOTE — Patient Instructions (Signed)

## 2017-08-06 ENCOUNTER — Ambulatory Visit: Payer: Federal, State, Local not specified - PPO | Admitting: Sports Medicine

## 2017-08-06 ENCOUNTER — Ambulatory Visit
Admission: RE | Admit: 2017-08-06 | Discharge: 2017-08-06 | Disposition: A | Discharge status: Home / Self Care | Payer: Federal, State, Local not specified - PPO | Source: Ambulatory Visit | Attending: Sports Medicine | Admitting: Sports Medicine

## 2017-08-06 DIAGNOSIS — M25572 Pain in left ankle and joints of left foot: Secondary | ICD-10-CM

## 2017-08-06 DIAGNOSIS — S99912A Unspecified injury of left ankle, initial encounter: Secondary | ICD-10-CM | POA: Diagnosis not present

## 2017-08-07 NOTE — Progress Notes (Deleted)
No chief complaint on file.   HPI: Glenda Mendez 26 y.o. come in for Chronic disease management  ROS: See pertinent positives and negatives per HPI.  Past Medical History:  Diagnosis Date  . ADHD (attention deficit hyperactivity disorder)   . Allergy   . Asthma   . BMI (body mass index), pediatric, 95-99% for age 03/21/2011   Is starting to gain weight again and advised and counseled today about reduction for health risk reasons. Patient is very well aware of how to do lifestyle intervention she has done this before.   Marland Kitchen Hearing loss in right ear    with hearing aid bilateral   . Hemiparesis ([place])    rt from neonatal period  . Hypoxia of newborn   . Irregular periods 06/04/2011  . PCOS (polycystic ovarian syndrome)   . Pulmonary hemorrhage of fetus or newborn    99 week C-section [facility] of with birth hypoxia    Family History  Problem Relation Age of Onset  . Adopted: Yes  . Hypertension Unknown   . Schizophrenia Mother        with alcohol drug abuse  . Diabetes Other     Social History   Social History  . Marital status: Single    Spouse name: N/A  . Number of children: N/A  . Years of education: N/A   Social History Main Topics  . Smoking status: Never Smoker  . Smokeless tobacco: Never Used  . Alcohol use No  . Drug use: No  . Sexual activity: No   Other Topics Concern  . 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 [name]- pulm at [facility] now has a new pulmonary Dr. Now stable and on prn       Thyroid evaluation in the past neck evaluation within normal limits and no thyroid disease.    Outpatient Medications Prior to Visit  Medication Sig Dispense Refill  . albuterol (PROVENTIL HFA;VENTOLIN HFA) 108 (90 BASE) MCG/ACT inhaler Inhale 2 puffs into the lungs every 6 (six)  hours as needed for wheezing or shortness of breath. 1 Inhaler 4  . albuterol (PROVENTIL) (2.5 MG/3ML) 0.083% nebulizer solution Take 3 mLs (2.5 mg total) by nebulization every 4 (four) hours as needed for wheezing or shortness of breath. 75 mL 2  . fluticasone (FLONASE) 50 MCG/ACT nasal spray Place 2 sprays into both nostrils daily. 16 g 6  . fluticasone-salmeterol (ADVAIR HFA) 230-21 MCG/ACT inhaler Inhale 2 puffs into the lungs 2 (two) times daily.    . hydrOXYzine (ATARAX/VISTARIL) 10 MG tablet Take 1 tablet (10 mg total) by mouth daily as needed for itching. 30 tablet 0  . ipratropium (ATROVENT) 0.02 % nebulizer solution USE 1 VIAL BY NEBULIZATION 3 (THREE) TIMES DAILY AS NEEDED FOR WHEEZING. 62.5 mL 0  . levocetirizine (XYZAL ALLERGY 24HR) 5 MG tablet Take 1 tablet (5 mg total) by mouth every evening. 90 tablet 3  . metFORMIN (GLUCOPHAGE-XR) 500 MG 24 hr tablet Take 1 tablet (500 mg total) by mouth daily with breakfast. Or largest meal increae to 1 twice a day after 2 weeks 60 tablet 5  . methylphenidate 36 MG PO CR tablet TAKE (2) TABLETS DAILY. 60 tablet 0  . Naproxen-Esomeprazole (VIMOVO) 500-20 MG TBEC  Take 1 tablet by mouth 2 (two) times daily. 60 tablet 2  . norgestimate-ethinyl estradiol (ORTHO-CYCLEN, 28,) 0.25-35 MG-MCG tablet Take 1 tablet by mouth daily. 1 Package 5  . omeprazole (PRILOSEC) 20 MG capsule Take 1 capsule (20 mg total) by mouth 2 (two) times daily before a meal. 60 capsule 3  . pantoprazole (PROTONIX) 40 MG tablet Take 1 tablet (40 mg total) by mouth daily. 30 tablet 0  . triamcinolone cream (KENALOG) 0.5 % Apply topically 2 (two) times daily. Use as directed 30 g 3   No facility-administered medications prior to visit.      EXAM:  There were no vitals taken for this visit.  There is no height or weight on file to calculate BMI.  GENERAL: vitals reviewed and listed above, alert, oriented, appears well hydrated and in no acute distress HEENT: atraumatic,  conjunctiva  clear, no obvious abnormalities on inspection of external nose and ears OP : no lesion edema or exudate  NECK: no obvious masses on inspection palpation  LUNGS: clear to auscultation bilaterally, no wheezes, rales or rhonchi, good air movement CV: HRRR, no clubbing cyanosis or  peripheral edema nl cap refill  MS: moves all extremities without noticeable focal  abnormality PSYCH: pleasant and cooperative, no obvious depression or anxiety Lab Results  Component Value Date   WBC 6.5 06/09/2017   HGB 10.7 (L) 06/09/2017   HCT 34.9 (L) 06/09/2017   PLT 472.0 (H) 06/09/2017   GLUCOSE 94 06/18/2017   CHOL 189 03/23/2017   TRIG 69.0 03/23/2017   HDL 54.00 03/23/2017   LDLCALC 121 (H) 03/23/2017   ALT 19 03/23/2017   AST 15 03/23/2017   NA 139 06/18/2017   K 4.4 06/18/2017   CL 105 06/18/2017   CREATININE 0.84 06/18/2017   BUN 11 06/18/2017   CO2 27 06/18/2017   TSH 3.16 03/23/2017   HGBA1C 6.6 (H) 03/23/2017   BP Readings from Last 3 Encounters:  07/23/17 110/80  06/24/17 130/78  06/23/17 112/80    ASSESSMENT AND PLAN:  Discussed the following assessment and plan:  Medication management  Prediabetes  Severe obesity (BMI >= 40) (HCC)  Insulin resistance  -Patient advised to return or notify health care team  if  new concerns arise.  There are no Patient Instructions on file for this visit.   Standley Brooking. Panosh M.D.

## 2017-08-10 ENCOUNTER — Ambulatory Visit: Payer: Federal, State, Local not specified - PPO | Admitting: Internal Medicine

## 2017-08-10 DIAGNOSIS — Z0289 Encounter for other administrative examinations: Secondary | ICD-10-CM

## 2017-08-13 ENCOUNTER — Telehealth: Payer: Self-pay | Admitting: Sports Medicine

## 2017-08-13 NOTE — Telephone Encounter (Signed)
Patient would like to now whats next after MRI.  Ty,  -LL

## 2017-08-14 ENCOUNTER — Telehealth: Payer: Self-pay | Admitting: Sports Medicine

## 2017-08-14 NOTE — Telephone Encounter (Signed)
Called pt and advised or results/recommendations. Pt verbalized understanding and OV was scheduled for 08/21/17.

## 2017-08-14 NOTE — Telephone Encounter (Signed)
Message from [place]: Depends on what she does for work.  If it's heavy manual labor or if she'll be on her feet a lot, then maybe not but if it's more of a sedentary job where she'll be sitting for most of the day, then she can go back to work.  I spoke with the pt to let her know. 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 I advised her to be off that day if possible.

## 2017-08-14 NOTE — Telephone Encounter (Signed)
Patient called stating that she is coming in on 11/2 for a ganglion cyst and said that she will be getting an injection? She wanted to know if she will be able to go back to work after it. Please advise.

## 2017-08-21 ENCOUNTER — Encounter: Payer: Self-pay | Admitting: Sports Medicine

## 2017-08-21 ENCOUNTER — Ambulatory Visit (INDEPENDENT_AMBULATORY_CARE_PROVIDER_SITE_OTHER): Payer: Federal, State, Local not specified - PPO | Admitting: Sports Medicine

## 2017-08-21 ENCOUNTER — Ambulatory Visit: Payer: Self-pay

## 2017-08-21 VITALS — BP 118/84 | HR 76 | Ht 65.0 in | Wt 291.8 lb

## 2017-08-21 DIAGNOSIS — M25572 Pain in left ankle and joints of left foot: Secondary | ICD-10-CM | POA: Diagnosis not present

## 2017-08-21 NOTE — Patient Instructions (Signed)

## 2017-08-21 NOTE — Procedures (Signed)
PROCEDURE NOTE -  ULTRASOUND GUIDEDINJECTION: LEFT ANKLE Images were obtained and interpreted by myself, Teresa Coombs, DO  Images have been saved and stored to PACS system. Images obtained on: GE S7 Ultrasound machine  ULTRASOUND FINDINGS: Small amount of hypoechoic change in posteriolateral ankle recess.  Injected directly today  DESCRIPTION OF PROCEDURE:  The patient's clinical condition is marked by substantial pain and/or significant functional disability. Other conservative therapy has not provided relief, is contraindicated, or not appropriate. There is a reasonable likelihood that injection will significantly improve the patient's pain and/or functional impairment. After discussing the risks, benefits and expected outcomes of the injection and all questions were reviewed and answered, the patient wished to undergo the above named procedure. Verbal consent was obtained. The ultrasound was used to identify the target structure and adjacent neurovascular structures. The skin was then prepped in sterile fashion and the target structure was injected under direct visualization using sterile technique as below: PREP: Alcohol, Ethel Chloride APPROACH: Direct inplane, single injection, 25g 1.5" needle INJECTATE: 3cc total of 4cc solution of 2cc 0.5% marcaine, 2cc 40mg  DepoMedrol ASPIRATE: N/A DRESSING: Band-Aid  Post procedural instructions including recommending icing and warning signs for infection were reviewed. This procedure was well tolerated and there were no complications.    IMPRESSION: Succesful US Guided Injection

## 2017-08-21 NOTE — Progress Notes (Signed)
OFFICE VISIT NOTE Glenda Mendez. Glenda Mendez, [place] at [facility] [phone]  Glenda Mendez - 26 y.o. female MRN [id number]  Date of birth: 1991/08/19  Visit Date: 08/21/2017  PCP: Burnis Medin, MD   Referred by: Burnis Medin, MD  [name] PT, LAT, ATC acting as scribe for Dr. Paulla Fore.  SUBJECTIVE:   Chief Complaint  Patient presents with  . Follow-up    L ankle MRI review and wrist ganglion cyst   HPI: As below and per problem based documentation when appropriate.  Glenda Mendez is an established pt presenting today for f/u of her L ankle pain, L ankle MRI review and ganglion cyst.  Pt was last seen on 07/23/17 and her L ankle MRI on 08/06/17.  Pt states that her L ankle remains swollen and notes that she has a hard time w/ weight-bearing activity.  She notes that her L foot doesn't want "to go flat on the floor" and also states that the cold weather makes it feel worse.  Pt states that she wears her brace consistently throughout the day unless she's resting at home or sleeping.  Pt states she is currently experiencing 8/10 pain today.    Review of Systems  Musculoskeletal: Positive for joint pain.  Neurological: Positive for dizziness, tingling and headaches.    Otherwise per HPI.  HISTORY & PERTINENT PRIOR DATA:  No specialty comments available. She reports that  has never smoked. she has never used smokeless tobacco.  Recent Labs    03/23/17 0944  HGBA1C 6.6*   Allergies reviewed per EMR Prior to Admission medications   Medication Sig Start Date End Date Taking? Authorizing Provider  albuterol (PROVENTIL HFA;VENTOLIN HFA) 108 (90 BASE) MCG/ACT inhaler Inhale 2 puffs into the lungs every 6 (six) hours as needed for wheezing or shortness of breath. 09/09/13   Panosh, Standley Brooking, MD  albuterol (PROVENTIL) (2.5 MG/3ML) 0.083% nebulizer solution Take 3 mLs (2.5 mg total) by nebulization every 4 (four) hours as needed for wheezing or  shortness of breath. 03/14/16   Panosh, Standley Brooking, MD  fluticasone (FLONASE) 50 MCG/ACT nasal spray Place 2 sprays into both nostrils daily. 02/18/17   Nafziger, [name], NP  fluticasone-salmeterol (ADVAIR HFA) 230-21 MCG/ACT inhaler Inhale 2 puffs into the lungs 2 (two) times daily.    [provider]  hydrOXYzine (ATARAX/VISTARIL) 10 MG tablet Take 1 tablet (10 mg total) by mouth daily as needed for itching. 08/12/16   Rigoberto Noel, MD  ipratropium (ATROVENT) 0.02 % nebulizer solution USE 1 VIAL BY NEBULIZATION 3 (THREE) TIMES DAILY AS NEEDED FOR WHEEZING. 02/18/17   Nafziger, [name], NP  levocetirizine (XYZAL ALLERGY 24HR) 5 MG tablet Take 1 tablet (5 mg total) by mouth every evening. 02/18/17   Nafziger, [name], NP  metFORMIN (GLUCOPHAGE-XR) 500 MG 24 hr tablet Take 1 tablet (500 mg total) by mouth daily with breakfast. Or largest meal increae to 1 twice a day after 2 weeks 06/09/17 06/09/18  Panosh, Standley Brooking, MD  methylphenidate 36 MG PO CR tablet TAKE (2) TABLETS DAILY. 03/30/17   Panosh, Standley Brooking, MD  Naproxen-Esomeprazole (VIMOVO) 500-20 MG TBEC Take 1 tablet by mouth 2 (two) times daily. 07/23/17   Gerda Diss, DO  norgestimate-ethinyl estradiol (ORTHO-CYCLEN, 28,) 0.25-35 MG-MCG tablet Take 1 tablet by mouth daily. 11/07/15   Panosh, Standley Brooking, MD  omeprazole (PRILOSEC) 20 MG capsule Take 1 capsule (20 mg total) by mouth 2 (two)  times daily before a meal. 06/20/14   Panosh, Standley Brooking, MD  pantoprazole (PROTONIX) 40 MG tablet Take 1 tablet (40 mg total) by mouth daily. 11/26/16   Panosh, Standley Brooking, MD  triamcinolone cream (KENALOG) 0.5 % Apply topically 2 (two) times daily. Use as directed 02/18/17 02/18/18  Dorothyann Peng, NP   Patient Active Problem List   Diagnosis Date Noted  . Left ankle pain 06/23/2017  . Ganglion cyst of flexor tendon sheath of finger of right hand 02/02/2017  . Trigger finger, right middle finger 02/02/2017  . Eczema 07/11/2016  . Pruritus 06/26/2016  . Dyspnea and respiratory  abnormality 05/08/2016  . Wheezing 05/08/2016  . Elevated testosterone level in female 08/28/2015  . Elevated hemoglobin A1c 08/28/2015  . Abdominal pain, right upper quadrant 06/05/2015  . Abnormal urine 06/05/2015  . Abnormal alkaline phosphatase test 04/27/2015  . Severe obesity (BMI >= 40) ([place]) 12/19/2014  . Asthma with acute exacerbation 08/29/2014  . Moderate intermittent asthma, uncomplicated 14/48/1856  . Rash, skin 06/20/2014  . Moderate persistent asthma with acute exacerbation in adult 05/27/2014  . Anterior chest wall pain 05/27/2014  . Low grade squamous intraepithelial lesion (LGSIL) on cervical Pap smear 12/02/2013  . Obesity (BMI 30-39.9) 11/28/2013  . Visit for preventive health examination 11/28/2013  . Routine gynecological examination 11/28/2013  . Weight gain 11/28/2013  . Asthma 11/28/2013  . Moderate persistent asthma without complication 31/49/7026  . Hemiparesis ([place])   . Oral contraceptive pill surveillance 04/25/2012  . Headache(784.0) 06/04/2011  . Irregular periods 06/04/2011  . Acne 06/04/2011  . BCP (birth control pills) initiation 06/04/2011  . Preventative health care 02/18/2011  . Neoplasm of uncertain behavior of skin 02/18/2011  . BMI (body mass index), pediatric, 95-99% for age 70/10/2010  . BACK PAIN 09/17/2010  . NECK PAIN 01/02/2009  . FATIGUE 12/06/2008  . Attention deficit hyperactivity disorder (ADHD) 09/03/2007  . UNSPECIFIED HEARING LOSS 09/03/2007  . ALLERGIC RHINITIS 05/19/2007  . ASTHMA 05/19/2007   Past Medical History:  Diagnosis Date  . ADHD (attention deficit hyperactivity disorder)   . Allergy   . Asthma   . BMI (body mass index), pediatric, 95-99% for age 55/10/2010   Is starting to gain weight again and advised and counseled today about reduction for health risk reasons. Patient is very well aware of how to do lifestyle intervention she has done this before.   Marland Kitchen Hearing loss in right ear    with hearing aid bilateral     . Hemiparesis ([place])    rt from neonatal period  . Hypoxia of newborn   . Irregular periods 06/04/2011  . PCOS (polycystic ovarian syndrome)   . Pulmonary hemorrhage of fetus or newborn    62 week C-section [facility] of with birth hypoxia   Family History  Adopted: Yes  Problem Relation Age of Onset  . Hypertension Unknown   . Schizophrenia Mother        with alcohol drug abuse  . Diabetes Other    Past Surgical History:  Procedure Laterality Date  . TONSILLECTOMY     Social History   Occupational History  . Not on file  Tobacco Use  . Smoking status: Never Smoker  . Smokeless tobacco: Never Used  Substance and Sexual Activity  . Alcohol use: No    Alcohol/week: 0.0 oz  . Drug use: No  . Sexual activity: No    Birth control/protection: Pill    OBJECTIVE:  VS:  HT:5\' 5"  (165.1 cm)  WT:291 lb 12.8 oz (132.4 kg)  BMI:48.56    BP:118/84  HR:76bpm  TEMP: ( )  RESP:99 % EXAM: Findings:  Ankle is overall well aligned but she has a flat foot with weightbearing.  She has pain across the posterior lateral ankle , but further posterior than the peroneal tendons.  Stable ankle drawer exam.  She has pain with subtalar motion.  DP and PT pulses are 2+/4.    RADIOLOGY: US GUIDED NEEDLE PLACEMENT(NO LINKED CHARGES) Gerda Diss, DO     08/21/2017 11:50 AM PROCEDURE NOTE -  ULTRASOUND GUIDEDINJECTION: LEFT ANKLE Images were obtained and interpreted by myself, Teresa Coombs, DO   Images have been saved and stored to PACS system. Images obtained on: GE S7 Ultrasound machine  ULTRASOUND FINDINGS: Small amount of hypoechoic change in  posteriolateral ankle recess.  Injected directly today  DESCRIPTION OF PROCEDURE:  The patient's clinical condition is marked by substantial pain  and/or significant functional disability. Other conservative  therapy has not provided relief, is contraindicated, or not  appropriate. There is a reasonable likelihood that injection will   significantly improve the patient's pain and/or functional  impairment. After discussing the risks, benefits and expected  outcomes of the injection and all questions were reviewed and  answered, the patient wished to undergo the above named  procedure. Verbal consent was obtained. The ultrasound was used  to identify the target structure and adjacent neurovascular  structures. The skin was then prepped in sterile fashion and the  target structure was injected under direct visualization using  sterile technique as below: PREP: Alcohol, Glenda Mendez APPROACH: Direct inplane, single injection, 25g 1.5" needle INJECTATE: 3cc total of 4cc solution of 2cc 0.5% marcaine, 2cc  40mg  DepoMedrol ASPIRATE: N/A DRESSING: Band-Aid  Post procedural instructions including recommending icing and  warning signs for infection were reviewed. This procedure was  well tolerated and there were no complications.    IMPRESSION: Succesful US Guided Injection    ASSESSMENT & PLAN:     ICD-10-CM   1. Left ankle pain, unspecified chronicity M25.572 US GUIDED NEEDLE PLACEMENT(NO LINKED CHARGES)   ================================================================= Left ankle pain Injection of the posterior lateral ankle performed today under direct ultrasound guidance.  Continue with compression and modify activities.  The lack of improvement may need to consider surgical/arthroscopic intervention.  PROCEDURE NOTE -  ULTRASOUND GUIDEDINJECTION: LEFT ANKLE Images were obtained and interpreted by myself, Teresa Coombs, DO  Images have been saved and stored to PACS system. Images obtained on: GE S7 Ultrasound machine  ULTRASOUND FINDINGS: Small amount of hypoechoic change in posteriolateral ankle recess.  Injected directly today  DESCRIPTION OF PROCEDURE:  The patient's clinical condition is marked by substantial pain and/or significant functional disability. Other conservative therapy has not  provided relief, is contraindicated, or not appropriate. There is a reasonable likelihood that injection will significantly improve the patient's pain and/or functional impairment. After discussing the risks, benefits and expected outcomes of the injection and all questions were reviewed and answered, the patient wished to undergo the above named procedure. Verbal consent was obtained. The ultrasound was used to identify the target structure and adjacent neurovascular structures. The skin was then prepped in sterile fashion and the target structure was injected under direct visualization using sterile technique as below: PREP: Alcohol, Glenda Mendez APPROACH: Direct inplane, single injection, 25g 1.5" needle INJECTATE: 3cc total of 4cc solution of 2cc 0.5% marcaine, 2cc 40mg  DepoMedrol ASPIRATE: N/A DRESSING: Band-Aid  Post procedural instructions including recommending icing and  warning signs for infection were reviewed. This procedure was well tolerated and there were no complications.    IMPRESSION: Succesful US Guided Injection    ================================================================= Patient Instructions  You had an injection today.  Things to be aware of after injection are listed below: . You may experience no significant improvement or even a slight worsening in your symptoms during the first 24 to 48 hours.  After that we expect your symptoms to improve gradually over the next 2 weeks for the medicine to have its maximal effect.  You should continue to have improvement out to 6 weeks after your injection. . Dr. Paulla Fore recommends icing the site of the injection for 20 minutes  1-2 times the day of your injection . You may shower but no swimming, tub bath or Jacuzzi for 24 hours. . If your bandage falls off this does not need to be replaced.  It is appropriate to remove the bandage after 4 hours. . You may resume light activities as tolerated unless otherwise directed per Dr.  Paulla Fore during your visit  POSSIBLE STEROID SIDE EFFECTS:  Side effects from injectable steroids tend to be less than when taken orally however you may experience some of the symptoms listed below.  If experienced these should only last for a short period of time. Change in menstrual flow  Edema (swelling)  Increased appetite Skin flushing (redness)  Skin rash/acne  Thrush (oral) Yeast vaginitis    Increased sweating  Depression Increased blood glucose levels Cramping and leg/calf  Euphoria (feeling happy)  POSSIBLE PROCEDURE SIDE EFFECTS: The side effects of the injection are usually fairly minimal however if you may experience some of the following side effects that are usually self-limited and will is off on their own.  If you are concerned please feel free to call the office with questions:  Increased numbness or tingling  Nausea or vomiting  Swelling or bruising at the injection site   Please call our office if if you experience any of the following symptoms over the next week as these can be signs of infection:   Fever greater than 100.95F  Significant swelling at the injection site  Significant redness or drainage from the injection site  If after 2 weeks you are continuing to have worsening symptoms please call our office to discuss what the next appropriate actions should be including the potential for a return office visit or other diagnostic testing.     ================================================================= Future Appointments  Date Time Provider [place]  09/21/2017 11:20 AM Gerda Diss, DO [facility] None    Follow-up: Return in about 4 weeks (around 09/18/2017).   CMA/ATC served as Education administrator during this visit. History, Physical, and Plan performed by medical provider. Documentation and orders reviewed and attested to.      Teresa Coombs, [facility] Physician

## 2017-08-24 NOTE — Assessment & Plan Note (Signed)
Injection of the posterior lateral ankle performed today under direct ultrasound guidance.  Continue with compression and modify activities.  The lack of improvement may need to consider surgical/arthroscopic intervention.

## 2017-09-19 ENCOUNTER — Ambulatory Visit: Payer: Federal, State, Local not specified - PPO | Admitting: Family Medicine

## 2017-09-19 ENCOUNTER — Encounter: Payer: Self-pay | Admitting: Family Medicine

## 2017-09-19 VITALS — BP 124/82 | HR 97 | Temp 98.4°F | Ht 65.0 in | Wt 293.0 lb

## 2017-09-19 DIAGNOSIS — J069 Acute upper respiratory infection, unspecified: Secondary | ICD-10-CM | POA: Diagnosis not present

## 2017-09-19 MED ORDER — BENZONATATE 100 MG PO CAPS: 100.0000 mg | ORAL_CAPSULE | Freq: Three times a day (TID) | ORAL | 0 refills | Status: DC | PRN

## 2017-09-19 NOTE — Progress Notes (Signed)
HPI:  Acute visit for respiratory illness: -started: 3 days ago -symptoms:nasal congestion, sore throat, cough -denies:fever, SOB, NVD, tooth pain, body aches -has tried: tylenol yesterday for HA, she has asthma and her mom gives her prednisone when she has a cough - so started that yesterday 10mg , used alb once  - no alb use/wheezing/excessive cough or SOB today -sick contacts/travel/risks: no reported flu, strep or tick exposure  ROS: See pertinent positives and negatives per HPI.  Past Medical History:  Diagnosis Date  . ADHD (attention deficit hyperactivity disorder)   . Allergy   . Asthma   . BMI (body mass index), pediatric, 95-99% for age 41/10/2010   Is starting to gain weight again and advised and counseled today about reduction for health risk reasons. Patient is very well aware of how to do lifestyle intervention she has done this before.   Marland Kitchen Hearing loss in right ear    with hearing aid bilateral   . Hemiparesis ([place])    rt from neonatal period  . Hypoxia of newborn   . Irregular periods 06/04/2011  . PCOS (polycystic ovarian syndrome)   . Pulmonary hemorrhage of fetus or newborn    56 week C-section [facility] of with birth hypoxia    Past Surgical History:  Procedure Laterality Date  . TONSILLECTOMY      Family History  Adopted: Yes  Problem Relation Age of Onset  . Hypertension Unknown   . Schizophrenia Mother        with alcohol drug abuse  . Diabetes Other     Social History   Socioeconomic History  . Marital status: Single    Spouse name: None  . Number of children: None  . Years of education: None  . Highest education level: None  Social Needs  . Financial resource strain: None  . Food insecurity - worry: None  . Food insecurity - inability: None  . Transportation needs - medical: None  . Transportation needs - non-medical: None  Occupational History  . None  Tobacco Use  . Smoking status: Never Smoker  . Smokeless tobacco: Never Used    Substance and Sexual Activity  . Alcohol use: No    Alcohol/week: 0.0 oz  . Drug use: No  . Sexual activity: No    Birth control/protection: Pill  Other Topics Concern  . 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 [name]- pulm at [facility] now has a new pulmonary Dr. Now stable and on prn       Thyroid evaluation in the past neck evaluation within normal limits and no thyroid disease.     Current Outpatient Medications:  .  albuterol (PROVENTIL HFA;VENTOLIN HFA) 108 (90 BASE) MCG/ACT inhaler, Inhale 2 puffs into the lungs every 6 (six) hours as needed for wheezing or shortness of breath., Disp: 1 Inhaler, Rfl: 4 .  albuterol (PROVENTIL) (2.5 MG/3ML) 0.083% nebulizer solution, Take 3 mLs (2.5 mg total) by nebulization every 4 (four) hours as needed for wheezing or shortness of breath., Disp: 75 mL, Rfl: 2 .  fluticasone (FLONASE) 50 MCG/ACT nasal spray, Place 2 sprays into both nostrils daily., Disp: 16 g, Rfl: 6 .  fluticasone-salmeterol (ADVAIR HFA) 230-21 MCG/ACT inhaler, Inhale 2 puffs into the lungs 2 (two) times daily., Disp: ,  Rfl:  .  hydrOXYzine (ATARAX/VISTARIL) 10 MG tablet, Take 1 tablet (10 mg total) by mouth daily as needed for itching., Disp: 30 tablet, Rfl: 0 .  ipratropium (ATROVENT) 0.02 % nebulizer solution, USE 1 VIAL BY NEBULIZATION 3 (THREE) TIMES DAILY AS NEEDED FOR WHEEZING., Disp: 62.5 mL, Rfl: 0 .  levocetirizine (XYZAL ALLERGY 24HR) 5 MG tablet, Take 1 tablet (5 mg total) by mouth every evening., Disp: 90 tablet, Rfl: 3 .  metFORMIN (GLUCOPHAGE-XR) 500 MG 24 hr tablet, Take 1 tablet (500 mg total) by mouth daily with breakfast. Or largest meal increae to 1 twice a day after 2 weeks, Disp: 60 tablet, Rfl: 5 .  methylphenidate 36 MG PO CR tablet, TAKE (2) TABLETS DAILY., Disp: 60 tablet, Rfl:  0 .  Naproxen-Esomeprazole (VIMOVO) 500-20 MG TBEC, Take 1 tablet by mouth 2 (two) times daily., Disp: 60 tablet, Rfl: 2 .  norgestimate-ethinyl estradiol (ORTHO-CYCLEN, 28,) 0.25-35 MG-MCG tablet, Take 1 tablet by mouth daily., Disp: 1 Package, Rfl: 5 .  omeprazole (PRILOSEC) 20 MG capsule, Take 1 capsule (20 mg total) by mouth 2 (two) times daily before a meal., Disp: 60 capsule, Rfl: 3 .  pantoprazole (PROTONIX) 40 MG tablet, Take 1 tablet (40 mg total) by mouth daily., Disp: 30 tablet, Rfl: 0 .  triamcinolone cream (KENALOG) 0.5 %, Apply topically 2 (two) times daily. Use as directed, Disp: 30 g, Rfl: 3 .  benzonatate (TESSALON PERLES) 100 MG capsule, Take 1 capsule (100 mg total) by mouth 3 (three) times daily as needed., Disp: 20 capsule, Rfl: 0  EXAM:  Vitals:   09/19/17 1122  BP: 124/82  Pulse: 97  Temp: 98.4 F (36.9 C)  SpO2: 100%    Body mass index is 48.76 kg/m.  GENERAL: vitals reviewed and listed above, alert, oriented, appears well hydrated and in no acute distress  HEENT: atraumatic, conjunttiva clear, no obvious abnormalities on inspection of external nose and ears, normal appearance of ear canals and TMs, clear nasal congestion, mild post oropharyngeal erythema with PND, no tonsillar edema or exudate, no sinus TTP  NECK: no obvious masses on inspection  LUNGS: clear to auscultation bilaterally, no wheezes, rales or rhonchi, good air movement  CV: HRRR, no peripheral edema  MS: moves all extremities without noticeable abnormality  PSYCH: pleasant and cooperative, no obvious depression or anxiety  ASSESSMENT AND PLAN:  Discussed the following assessment and plan:  Viral upper respiratory illness  -given HPI and exam findings today, a serious infection or illness is unlikely. We discussed potential etiologies, with VURI being most likely, and advised supportive care and monitoring. Tessalon for cough. We discussed treatment side effects, likely course,  antibiotic misuse, transmission, and signs of developing a serious illness. -no sig symptoms of severe asthma exacerbation today- alb prn, prednisone if not responding and should contact doctor -of course, we advised to return or notify a doctor immediately if symptoms worsen or persist or new concerns arise.    Patient Instructions  INSTRUCTIONS FOR UPPER RESPIRATORY INFECTION:  -plenty of rest and fluids  -nasal saline wash 2-3 times daily (use prepackaged nasal saline or bottled/distilled water if making your own)   -can use AFRIN nasal spray for drainage and nasal congestion - but do NOT use longer then 3-4 days  -can use tylenol (in no history of liver disease) or ibuprofen (if no history of kidney disease, bowel bleeding or significant heart disease) as directed for aches and sorethroat  -in the winter time, using a humidifier  at night is helpful (please follow cleaning instructions)  -if you are taking a cough medication - use only as directed, may also try a teaspoon of honey to coat the throat and throat lozenges. Tessalon for cough as needed.  -for sore throat, salt water gargles can help  -follow up if you have fevers, facial pain, tooth pain, difficulty breathing or are worsening or symptoms persist longer then expected  Upper Respiratory Infection, Adult An upper respiratory infection (URI) is also known as the common cold. It is often caused by a type of germ (virus). Colds are easily spread (contagious). You can pass it to others by kissing, coughing, sneezing, or drinking out of the same glass. Usually, you get better in 1 to 3  weeks.  However, the cough can last for even longer. HOME CARE   Only take medicine as told by your doctor. Follow instructions provided above.  Drink enough water and fluids to keep your pee (urine) clear or pale yellow.  Get plenty of rest.  Return to work when your temperature is < 100 for 24 hours or as told by your doctor. You may use a  face mask and wash your hands to stop your cold from spreading. GET HELP RIGHT AWAY IF:   After the first few days, you feel you are getting worse.  You have questions about your medicine.  You have chills, shortness of breath, or red spit (mucus).  You have pain in the face for more then 1-2 days, especially when you bend forward.  You have a fever, puffy (swollen) neck, pain when you swallow, or white spots in the back of your throat.  You have a bad headache, ear pain, sinus pain, or chest pain.  You have a high-pitched whistling sound when you breathe in and out (wheezing).  You cough up blood.  You have sore muscles or a stiff neck. MAKE SURE YOU:   Understand these instructions.  Will watch your condition.  Will get help right away if you are not doing well or get worse. Document Released: 03/24/2008 Document Revised: 12/29/2011 Document Reviewed: 01/11/2014 [facility] Patient Information 2015 [place], [place]. This information is not intended to replace advice given to you by your health care provider. Make sure you discuss any questions you have with your health care provider.    Colin Benton R., DO

## 2017-09-19 NOTE — Patient Instructions (Signed)
INSTRUCTIONS FOR UPPER RESPIRATORY INFECTION:  -plenty of rest and fluids  -nasal saline wash 2-3 times daily (use prepackaged nasal saline or bottled/distilled water if making your own)   -can use AFRIN nasal spray for drainage and nasal congestion - but do NOT use longer then 3-4 days  -can use tylenol (in no history of liver disease) or ibuprofen (if no history of kidney disease, bowel bleeding or significant heart disease) as directed for aches and sorethroat  -in the winter time, using a humidifier at night is helpful (please follow cleaning instructions)  -if you are taking a cough medication - use only as directed, may also try a teaspoon of honey to coat the throat and throat lozenges. Tessalon for cough as needed.  -for sore throat, salt water gargles can help  -follow up if you have fevers, facial pain, tooth pain, difficulty breathing or are worsening or symptoms persist longer then expected  Upper Respiratory Infection, Adult An upper respiratory infection (URI) is also known as the common cold. It is often caused by a type of germ (virus). Colds are easily spread (contagious). You can pass it to others by kissing, coughing, sneezing, or drinking out of the same glass. Usually, you get better in 1 to 3  weeks.  However, the cough can last for even longer. HOME CARE   Only take medicine as told by your doctor. Follow instructions provided above.  Drink enough water and fluids to keep your pee (urine) clear or pale yellow.  Get plenty of rest.  Return to work when your temperature is < 100 for 24 hours or as told by your doctor. You may use a face mask and wash your hands to stop your cold from spreading. GET HELP RIGHT AWAY IF:   After the first few days, you feel you are getting worse.  You have questions about your medicine.  You have chills, shortness of breath, or red spit (mucus).  You have pain in the face for more then 1-2 days, especially when you bend  forward.  You have a fever, puffy (swollen) neck, pain when you swallow, or white spots in the back of your throat.  You have a bad headache, ear pain, sinus pain, or chest pain.  You have a high-pitched whistling sound when you breathe in and out (wheezing).  You cough up blood.  You have sore muscles or a stiff neck. MAKE SURE YOU:   Understand these instructions.  Will watch your condition.  Will get help right away if you are not doing well or get worse. Document Released: 03/24/2008 Document Revised: 12/29/2011 Document Reviewed: 01/11/2014 [facility] Patient Information 2015 [place], [place]. This information is not intended to replace advice given to you by your health care provider. Make sure you discuss any questions you have with your health care provider.

## 2017-09-21 ENCOUNTER — Telehealth: Payer: Self-pay | Admitting: *Deleted

## 2017-09-21 ENCOUNTER — Ambulatory Visit: Payer: Federal, State, Local not specified - PPO | Admitting: Sports Medicine

## 2017-09-21 ENCOUNTER — Encounter: Payer: Self-pay | Admitting: Sports Medicine

## 2017-09-21 VITALS — BP 118/82 | HR 88 | Ht 65.0 in | Wt 293.0 lb

## 2017-09-21 DIAGNOSIS — G8929 Other chronic pain: Secondary | ICD-10-CM

## 2017-09-21 DIAGNOSIS — M25572 Pain in left ankle and joints of left foot: Secondary | ICD-10-CM

## 2017-09-21 NOTE — Assessment & Plan Note (Signed)
Overall she is significantly better.  Having some abnormality of her gait, like for her to work with physical therapy to help correct this but her intrinsic ankle strength is improved.  The small amount of swelling she is having seems to be generalized and she would likely benefit from compression socks while she is standing for prolonged periods.  I would like to see her back in 8 weeks to ensure clinical resolution at that time.  Can always consider repeat injection if indicated.  Continue with home therapeutic exercises

## 2017-09-21 NOTE — Telephone Encounter (Signed)
Patient Name: Glenda Mendez Gender: Female DOB: Jan 20, 1991 Age: 26 Y 11 M 15 D Return Phone Number: [phone] (Primary), [phone] (Secondary) Address: City/State/Zip: [place] [address] Client [facility] - Client Client Site [facility] - Night Physician Shanon Ace - MD Contact Type Call Who Is Calling Patient / Member / Family / Caregiver Call Type Triage / Clinical Caller Name Malyah Ohlrich Relationship To Patient Mother Return Phone Number [phone] (Secondary) Chief Complaint Headache Reason for Call Symptomatic / Request for Health Information Initial Comment Callers daughter can barely talk, head hurts, and throat is sore. No sign of fever. Translation No Nurse Assessment Nurse: Hervey Ard, RN, Jewel Date/Time (Eastern Time): 09/19/2017 9:53:58 AM Confirm and document reason for call. If symptomatic, describe symptoms. ---Caller states that her dtr has a horse voice, sore throat, and HA. No fever. Started 2 days ago. Does the patient have any new or worsening symptoms? ---Yes Will a triage be completed? ---Yes Related visit to physician within the last 2 weeks? ---No Does the PT have any chronic conditions? (i.e. diabetes, asthma, etc.) ---Yes List chronic conditions. ---Lung disease with asthma Is the patient pregnant or possibly pregnant? (Ask all females between the ages of 47-55) ---No Is this a behavioral health or substance abuse call? ---No Guidelines Guideline Title Affirmed Question Affirmed Notes Nurse Date/Time (Eastern Time) Headache [1] MODERATE headache (e.g., interferes with normal activities) AND [2] present > 24 hours AND [3] unexplained (Exceptions: analgesics not tried, typical migraine, or headache part of viral illness) Hervey Ard, RN, Jewel 09/19/2017 10:00:18 AM PLEASE NOTE: All timestamps contained within this report are represented as [place] Standard Time. CONFIDENTIALTY NOTICE: This  fax transmission is intended only for the addressee. It contains information that is legally privileged, confidential or otherwise protected from use or disclosure. If you are not the intended recipient, you are strictly prohibited from reviewing, disclosing, copying using or disseminating any of this information or taking any action in reliance on or regarding this information. If you have received this fax in error, please notify us immediately by telephone so that we can arrange for its return to [place]. Phone: [phone], Toll-Free: [phone], Fax: [phone] Page: 2 of 2 Call Id: [id number] Leonardo. Time Eilene Ghazi Time) Disposition Final User 09/19/2017 10:06:15 AM See Physician within 24 Hours Yes Hervey Ard, RN, Jewel Caller Disagree/Comply Comply Caller Understands Yes PreDisposition Call Doctor Care Advice Given Per Guideline SEE PHYSICIAN WITHIN 24 HOURS: CARE ADVICE given per Headache (Adult) guideline. CALL BACK IF: * You become worse. Comments User: Nils Flack, RN Date/Time Eilene Ghazi Time): 09/19/2017 10:03:00 AM Have given prednisone, allegra, User: Nils Flack, RN Date/Time Eilene Ghazi Time): 09/19/2017 10:06:34 AM Nurse gave caller [place] [facility] #. Referrals [facility]

## 2017-09-21 NOTE — Telephone Encounter (Signed)
[facility] seen in [facility] 09/19/17.

## 2017-09-21 NOTE — Progress Notes (Signed)
Glenda Mendez. Glenda Mendez, [place] at [facility] [phone]  Glenda Mendez - 26 y.o. female MRN [id number]  Date of birth: October 19, 1991   Scribe for today's visit: Josepha Pigg, CMA    SUBJECTIVE:  Glenda Mendez is here for Follow-up (LT ankle pain)  Compared to the last office visit, her previously described symptoms are improving. She thinks there is still a little swelling and pain which is worse when its cold outside. She still feels like there is a little movement where the cyst was when rotating the ankle.  Current symptoms are mild & are nonradiating She has been wearing ankle brace, mostly when she is going to be on her feet for extended periods of time. She had steroid Injection 08/21/17 with good improvement   ROS Reports night time disturbances. Denies fevers, chills, or night sweats. Denies unexplained weight loss. Denies personal history of cancer. Denies changes in bowel or bladder habits. Denies recent unreported falls. Reports new or worsening dyspnea or wheezing. Reports headaches or dizziness.  Denies numbness, tingling or weakness  In the extremities.  Denies dizziness or presyncopal episodes Reports lower extremity edema    HISTORY & PERTINENT PRIOR DATA:  Prior History reviewed and updated per electronic medical record. Significant history, findings, studies and interim changes include: No additional findings.  reports that  has never smoked. she has never used smokeless tobacco. Recent Labs    03/23/17 0944  HGBA1C 6.6*   Problem  Left Ankle Pain     OBJECTIVE:  VS:  HT:5\' 5"  (165.1 cm)   WT:293 lb (132.9 kg)  BMI:48.76    BP:118/82  HR:88bpm  TEMP: ( )  RESP:98 %  PHYSICAL EXAM: Constitutional: WDWN, Non-toxic appearing. Psychiatric: Alert & appropriately interactive. Not depressed or anxious appearing. Respiratory: No increased work of breathing. Trachea Midline Eyes: Pupils are equal. EOM  intact without nystagmus. No scleral icterus  Left ankle: Overall well aligned.  She has slight curling of the left foot and toes but dorsiflexion is to 95 degrees on the left compared to 100 degrees on the right.  Intrinsic ankle strength is 5- out of 5 with inversion and eversion but this is improved.  Her proprioception is poor.  She is a small amount of anterior lateral swelling but this is minimal.  Trace pitting edema bilateral lower extremities.  Her lower extreme a sensation is intact.  Good capillary refill.   ASSESSMENT & PLAN:   1. Chronic pain of left ankle    Plan:   Left ankle pain Overall she is significantly better.  Having some abnormality of her gait, like for her to work with physical therapy to help correct this but her intrinsic ankle strength is improved.  The small amount of swelling she is having seems to be generalized and she would likely benefit from compression socks while she is standing for prolonged periods.  I would like to see her back in 8 weeks to ensure clinical resolution at that time.  Can always consider repeat injection if indicated.  Continue with home therapeutic exercises   ++++++++++++++++++++++++++++++++++++++++++++ Orders:  Orders Placed This Encounter  Procedures  . Ambulatory referral to Physical Therapy    Meds:  No orders of the defined types were placed in this encounter.   ++++++++++++++++++++++++++++++++++++++++++++ Follow-up: Return in about 8 weeks (around 11/16/2017).   Pertinent documentation may be included in additional procedure notes, imaging studies, problem based documentation and patient instructions. Please see these  sections of the encounter for additional information regarding this visit. CMA/ATC served as Education administrator during this visit. History, Physical, and Plan performed by medical provider. Documentation and orders reviewed and attested to.      Gerda Diss, [place] [facility] Physician

## 2017-09-21 NOTE — Patient Instructions (Signed)
Please look into getting some over-the-counter compression socks at your convenience.

## 2017-09-29 ENCOUNTER — Ambulatory Visit: Payer: Federal, State, Local not specified - PPO

## 2017-09-29 ENCOUNTER — Encounter: Payer: Self-pay | Admitting: Sports Medicine

## 2017-09-29 NOTE — Assessment & Plan Note (Signed)
Given persistent pain and swelling lack of improvement with injection further diagnostic evaluation with MRI indicated at this time.    Vimovo prescription provided

## 2017-11-16 ENCOUNTER — Encounter: Payer: Self-pay | Admitting: Sports Medicine

## 2017-11-16 ENCOUNTER — Ambulatory Visit (INDEPENDENT_AMBULATORY_CARE_PROVIDER_SITE_OTHER): Payer: Federal, State, Local not specified - PPO

## 2017-11-16 ENCOUNTER — Ambulatory Visit: Payer: Federal, State, Local not specified - PPO | Admitting: Sports Medicine

## 2017-11-16 VITALS — BP 110/80 | HR 80 | Ht 65.0 in | Wt 292.6 lb

## 2017-11-16 DIAGNOSIS — R1032 Left lower quadrant pain: Secondary | ICD-10-CM

## 2017-11-16 DIAGNOSIS — R102 Pelvic and perineal pain: Secondary | ICD-10-CM | POA: Diagnosis not present

## 2017-11-16 DIAGNOSIS — G8929 Other chronic pain: Secondary | ICD-10-CM | POA: Diagnosis not present

## 2017-11-16 DIAGNOSIS — M25572 Pain in left ankle and joints of left foot: Secondary | ICD-10-CM | POA: Diagnosis not present

## 2017-11-16 DIAGNOSIS — R1030 Lower abdominal pain, unspecified: Secondary | ICD-10-CM | POA: Diagnosis not present

## 2017-11-16 LAB — CBC WITH DIFFERENTIAL/PLATELET
Basophils Absolute: 13 cells/uL (ref 0–200)
Basophils Relative: 0.2 %
Eosinophils Absolute: 120 cells/uL (ref 15–500)
Eosinophils Relative: 1.9 %
HCT: 37.5 % (ref 35.0–45.0)
Hemoglobin: 11.7 g/dL (ref 11.7–15.5)
Lymphs Abs: 2274 cells/uL (ref 850–3900)
MCH: 20.6 pg — ABNORMAL LOW (ref 27.0–33.0)
MCHC: 31.2 g/dL — ABNORMAL LOW (ref 32.0–36.0)
MCV: 66.1 fL — ABNORMAL LOW (ref 80.0–100.0)
MPV: 9.4 fL (ref 7.5–12.5)
Monocytes Relative: 6.5 %
Neutro Abs: 3484 cells/uL (ref 1500–7800)
Neutrophils Relative %: 55.3 %
Platelets: 432 10*3/uL — ABNORMAL HIGH (ref 140–400)
RBC: 5.67 10*6/uL — ABNORMAL HIGH (ref 3.80–5.10)
RDW: 16.5 % — ABNORMAL HIGH (ref 11.0–15.0)
Total Lymphocyte: 36.1 %
WBC mixed population: 410 cells/uL (ref 200–950)
WBC: 6.3 10*3/uL (ref 3.8–10.8)

## 2017-11-16 LAB — COMPREHENSIVE METABOLIC PANEL
AG Ratio: 1.3 (calc) (ref 1.0–2.5)
ALT: 25 U/L (ref 6–29)
AST: 20 U/L (ref 10–30)
Albumin: 4.3 g/dL (ref 3.6–5.1)
Alkaline phosphatase (APISO): 136 U/L — ABNORMAL HIGH (ref 33–115)
BUN: 10 mg/dL (ref 7–25)
CO2: 22 mmol/L (ref 20–32)
Calcium: 9.2 mg/dL (ref 8.6–10.2)
Chloride: 104 mmol/L (ref 98–110)
Creat: 0.83 mg/dL (ref 0.50–1.10)
Globulin: 3.4 g/dL (calc) (ref 1.9–3.7)
Glucose, Bld: 87 mg/dL (ref 65–99)
Potassium: 4 mmol/L (ref 3.5–5.3)
Sodium: 139 mmol/L (ref 135–146)
Total Bilirubin: 0.4 mg/dL (ref 0.2–1.2)
Total Protein: 7.7 g/dL (ref 6.1–8.1)

## 2017-11-16 LAB — CBC MORPHOLOGY

## 2017-11-16 MED ORDER — DICLOFENAC SODIUM 75 MG PO TBEC: 75.0000 mg | DELAYED_RELEASE_TABLET | Freq: Two times a day (BID) | ORAL | 0 refills | Status: DC

## 2017-11-16 NOTE — Assessment & Plan Note (Signed)
Patient has undergone 2 injections of her left ankle as well as an MRI that was nonrevealing.  MRI was on 08/06/2017 show a small joint effusion without significant internal derangement of the ankle however there is a small posterior ganglion versus trapped tibiotalar joint fluid and this does correlate with her area of pain and I am concerned for potential radiographically occult lesion.  Patient would likely benefit from arthroscopic intervention for both diagnostic and therapeutic purposes and recommend her being seen by Dr. Sharol Given.

## 2017-11-16 NOTE — Progress Notes (Signed)
Glenda Mendez. Glenda Mendez, [place] at [facility] [phone]  Glenda Mendez - 27 y.o. female MRN [id number]  Date of birth: 1991-10-18  Visit Date: 11/16/2017  PCP: Glenda Medin, MD   Referred by: Glenda Medin, MD   Scribe for today's visit: Josepha Pigg, CMA     SUBJECTIVE:  Glenda Mendez is here for Follow-up   Compared to the last office visit, her previously described symptoms of LT ankle pain: show no change, she continues to have swelling around the ankle. Pain is worse with "cloudy" weather. Pain is not quite at bad as it was previously. Pain is also worse when first getting up in the morning.  Current symptoms are mild-moderate & are radiating to LT leg. At times she feels like something is "floating" in her ankle. She had steroid injection 08/21/17 and tolerated well and did get some relief. She wears ankle brace while being active. She has been doing home exercises with no trouble.    Pt also c/o pelvic and lower back pain before, during, and after cycle. Pain is described as sharp. She normally gets cramps during menses but nothing as intense at the sharp pain she felt with last cycle. She has noticed groin pain. She still has pain even though her cycle stopped. She has tried Diclofenac with some relief. She denies injury to back, hips, pelvis.    ROS Reports night time disturbances. Denies fevers, chills, or night sweats. Denies unexplained weight loss. Denies personal history of cancer. Denies changes in bowel or bladder habits. Denies recent unreported falls. Denies new or worsening dyspnea or wheezing. Reports headaches or dizziness.  Reports numbness, tingling or weakness  In the extremities.  Denies dizziness or presyncopal episodes Reports lower extremity edema     HISTORY & PERTINENT PRIOR DATA:  Prior History reviewed and updated per electronic medical record.  Significant history, findings, studies and  interim changes include:  reports that  has never smoked. she has never used smokeless tobacco. Recent Labs    03/23/17 0944  HGBA1C 6.6*   No specialty comments available. Problem  Pelvic Pain  Left Ankle Pain   Ankle Injected 06/23/17 MRI 08/06/17 -small amount of joint effusion without significant internal derangement however there is a small posterior ganglion versus entrapped tibiotalar joint fluid.   Directly injected posterior fluid on 09/06/2017 with ultrasound guidance     OBJECTIVE:  VS:  HT:5\' 5"  (165.1 cm)   WT:292 lb 9.6 oz (132.7 kg)  BMI:48.69    BP:110/80  HR:80bpm  TEMP: ( )  RESP:99 %   PHYSICAL EXAM: Constitutional: WDWN, Non-toxic appearing. Psychiatric: Alert & appropriately interactive.  Not depressed or anxious appearing. Respiratory: No increased work of breathing.  Trachea Midline Eyes: Pupils are equal.  EOM intact without nystagmus.  No scleral icterus  NEUROVASCULAR exam: No clubbing or cyanosis appreciated No significant venous stasis changes Capillary Refill: normal, less than 2 seconds   LOWER Extremities  Pre-tibial edema: No significant pretibial edema Pedal Pulses: Normal & symmetrically palpable Dermatomes intact to light touch Normal strength in all myotomes     Left ankle is overall well aligned she does have a markedly flat foot with weightbearing bilaterally.  She does have some increased tone globally of the left ankle and poor proprioception however strength is intact. Ligamentously she is stable although she does have some pain with cotton testing and has most focal pain with direct palpation over the anterior  lateral ankle.   Abdominal exam: Patient is in no acute respiratory distress however is quite uncomfortable with abdominal exam. Walks with upright posture although does guard while going from sitting to standing.  She has no significant pain midline of her abdomen or lumbar spine but does have some paraspinal muscle  tenderness and lower pelvic tenderness diffusely with Abdominal exam is soft and diffusely tender most focally over the left lower quadrant where there is a questionable firmness which is difficult to delineate based on body habitus.  No rebound guarding No additional findings.   ASSESSMENT & PLAN:   1. Chronic pain of left ankle   2. Pelvic pain    PLAN:    Pelvic pain Patient is having fairly significant low abdominal pelvic pain without other systemic symptoms.  She does have some guarding with palpation of her lower abdomen without any kind of rebound or peritoneal signs.  She does have a large abdomen and pannus however there is a slight irregularity in the left lower quadrant that is mildly tender.  Recommend that she follow-up with her PCP and/or gynecology for further evaluation of this as it is ongoing and slightly different than her normal.  Related pain.  We will try her on diclofenac and check a KUB to ensure no significant abdominal etiology.  Discussed red flag symptoms and she will follow-up with her PCP ASAP.  Left ankle pain Patient has undergone 2 injections of her left ankle as well as an MRI that was nonrevealing.  MRI was on 08/06/2017 show a small joint effusion without significant internal derangement of the ankle however there is a small posterior ganglion versus trapped tibiotalar joint fluid and this does correlate with her area of pain and I am concerned for potential radiographically occult lesion.  Patient would likely benefit from arthroscopic intervention for both diagnostic and therapeutic purposes and recommend her being seen by Dr. Sharol Given.   ++++++++++++++++++++++++++++++++++++++++++++ Orders & Meds: Orders Placed This Encounter  Procedures  . DG Abd 1 View  . AMB referral to orthopedics    Meds ordered this encounter  Medications  . diclofenac (VOLTAREN) 75 MG EC tablet    Sig: Take 1 tablet (75 mg total) by mouth 2 (two) times daily. Take 1 tab bid X 10  days then as needed    Dispense:  30 tablet    Refill:  0    ++++++++++++++++++++++++++++++++++++++++++++ Follow-up: No Follow-up on file.   Pertinent documentation may be included in additional procedure notes, imaging studies, problem based documentation and patient instructions. Please see these sections of the encounter for additional information regarding this visit. CMA/ATC served as Education administrator during this visit. History, Physical, and Plan performed by medical provider. Documentation and orders reviewed and attested to.      Gerda Diss, [facility] Physician

## 2017-11-16 NOTE — Assessment & Plan Note (Signed)
Patient is having fairly significant low abdominal pelvic pain without other systemic symptoms.  She does have some guarding with palpation of her lower abdomen without any kind of rebound or peritoneal signs.  She does have a large abdomen and pannus however there is a slight irregularity in the left lower quadrant that is mildly tender.  Recommend that she follow-up with her PCP and/or gynecology for further evaluation of this as it is ongoing and slightly different than her normal.  Related pain.  We will try her on diclofenac and check a KUB to ensure no significant abdominal etiology.  Discussed red flag symptoms and she will follow-up with her PCP ASAP.

## 2017-11-17 ENCOUNTER — Encounter: Payer: Self-pay | Admitting: Sports Medicine

## 2017-11-17 ENCOUNTER — Ambulatory Visit (INDEPENDENT_AMBULATORY_CARE_PROVIDER_SITE_OTHER)
Admission: RE | Admit: 2017-11-17 | Discharge: 2017-11-17 | Disposition: A | Discharge status: Home / Self Care | Payer: Federal, State, Local not specified - PPO | Source: Ambulatory Visit | Attending: Sports Medicine | Admitting: Sports Medicine

## 2017-11-17 ENCOUNTER — Telehealth: Payer: Self-pay | Admitting: Internal Medicine

## 2017-11-17 DIAGNOSIS — R1032 Left lower quadrant pain: Secondary | ICD-10-CM | POA: Diagnosis not present

## 2017-11-17 DIAGNOSIS — R102 Pelvic and perineal pain: Secondary | ICD-10-CM | POA: Diagnosis not present

## 2017-11-17 DIAGNOSIS — R109 Unspecified abdominal pain: Secondary | ICD-10-CM | POA: Diagnosis not present

## 2017-11-17 MED ORDER — IOPAMIDOL (ISOVUE-300) INJECTION 61%
100.0000 mL | Freq: Once | INTRAVENOUS | Status: AC | PRN
  Administered 2017-11-17: 100 mL via INTRAVENOUS

## 2017-11-17 NOTE — Telephone Encounter (Signed)
Per Lab °Stat CMP without EGFR and stat CBC Collected today at 1654. No critical values.  °PER [facility] °

## 2017-11-17 NOTE — Progress Notes (Signed)
>  50% of this 45 minute visit spent in direct patient counseling and/or coordination of care.  Discussion with patient's primary care physician office as well as with Dr. Juleen China.  Patient counseled on red flag symptoms.

## 2017-11-17 NOTE — Progress Notes (Signed)
My chart message sent to the patient.  Please have her schedule a follow-up with either Dr. Juleen China tomorrow or with Dr. Regis Bill next week when she returns.

## 2017-11-17 NOTE — Progress Notes (Signed)
Unfortunately patient's primary care physician is out of the office for the remainder of the week.  I did discuss the case briefly with Dr. Juleen China due to this and given the discomfort and generalized tenderness and limited utility of ultrasound further evaluation with basic lab work and CT of the abdomen and pelvis recommended.  We will have her follow-up with Dr. Juleen China after this is obtained for further management until Dr. Regis Bill returns

## 2017-11-26 IMAGING — CR DG CHEST 2V
2 series · 2 of 2 positions shown · non-contrast
Comparison: 07/11/2015

CLINICAL DATA: Shortness of breath today. Nonsmoker. History of
chronic lung disease and asthma.

EXAM:
CHEST  2 VIEW

[chest pa]
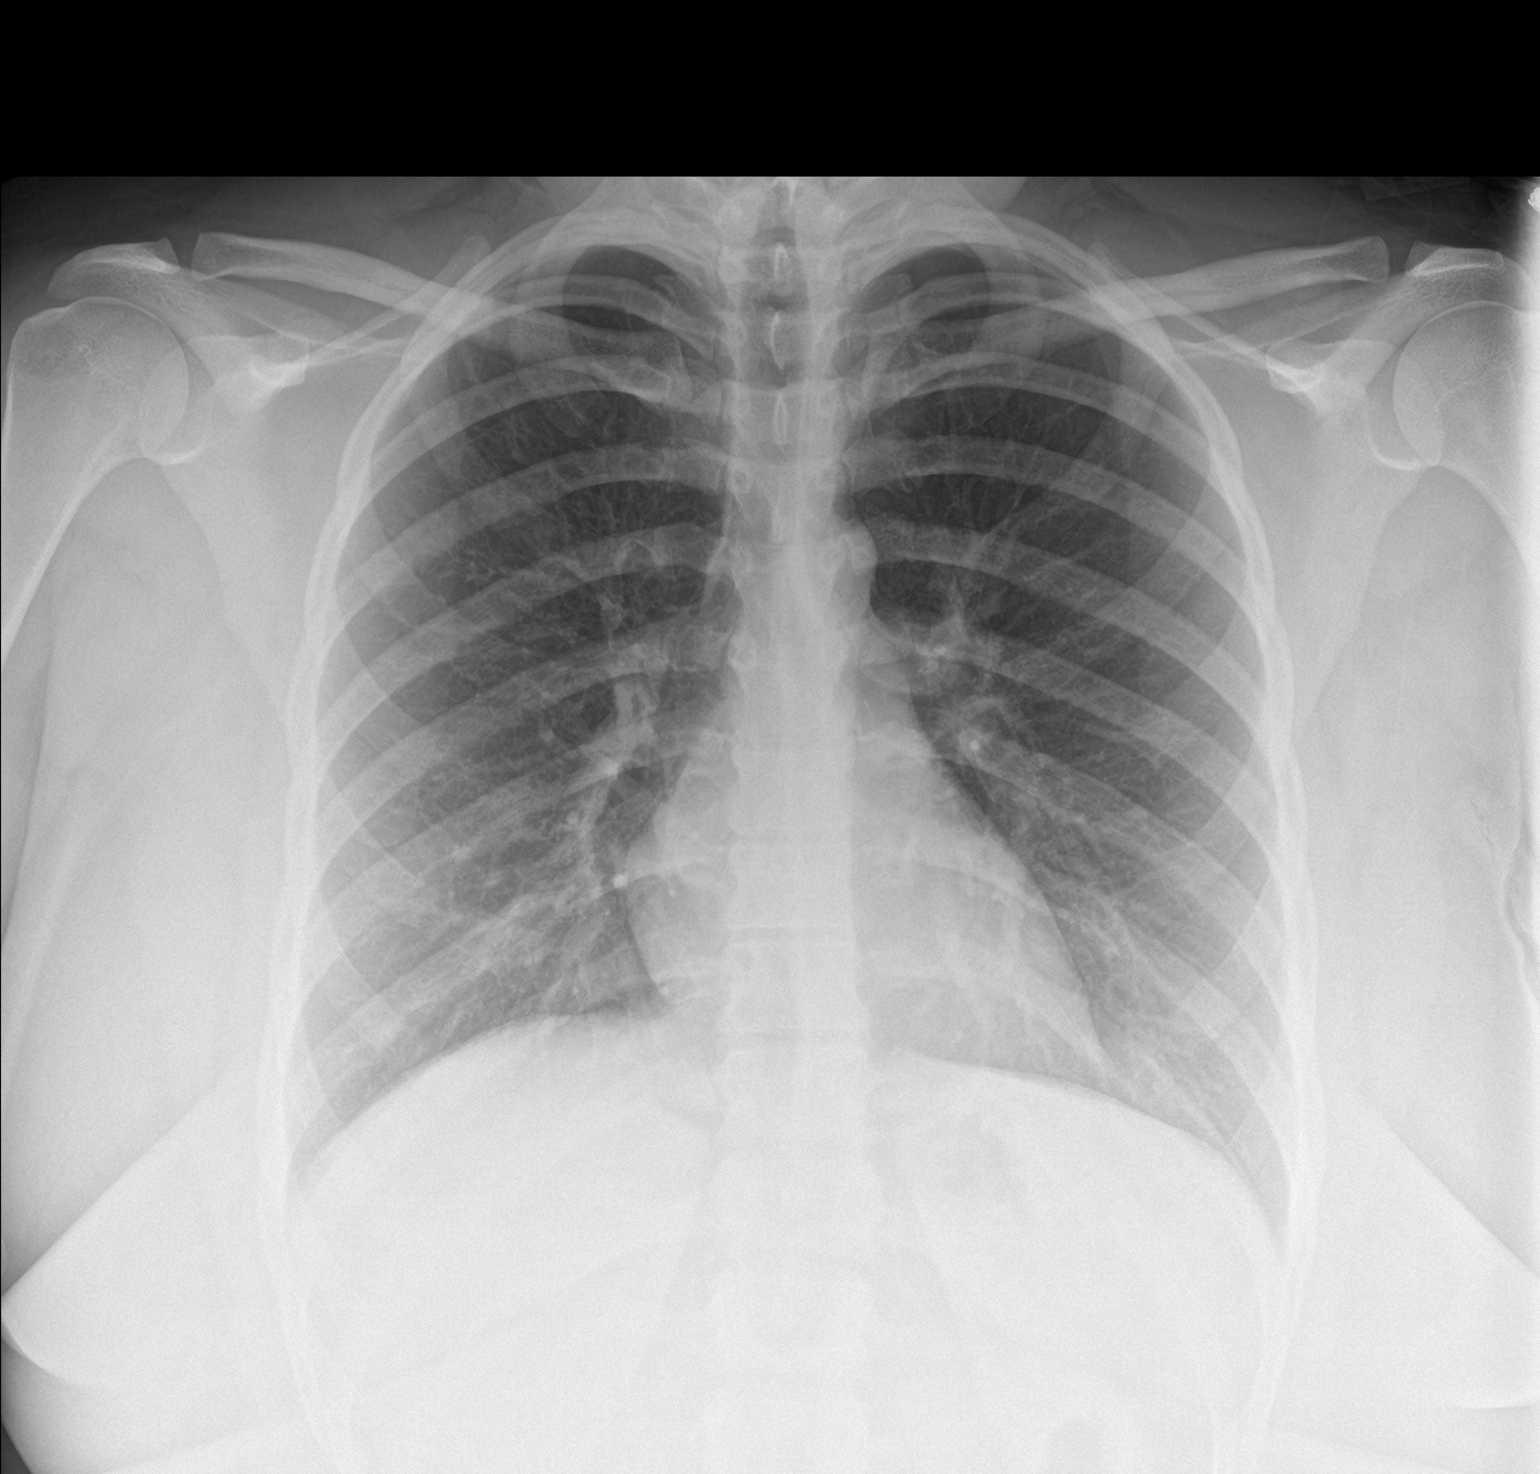

[chest lat]
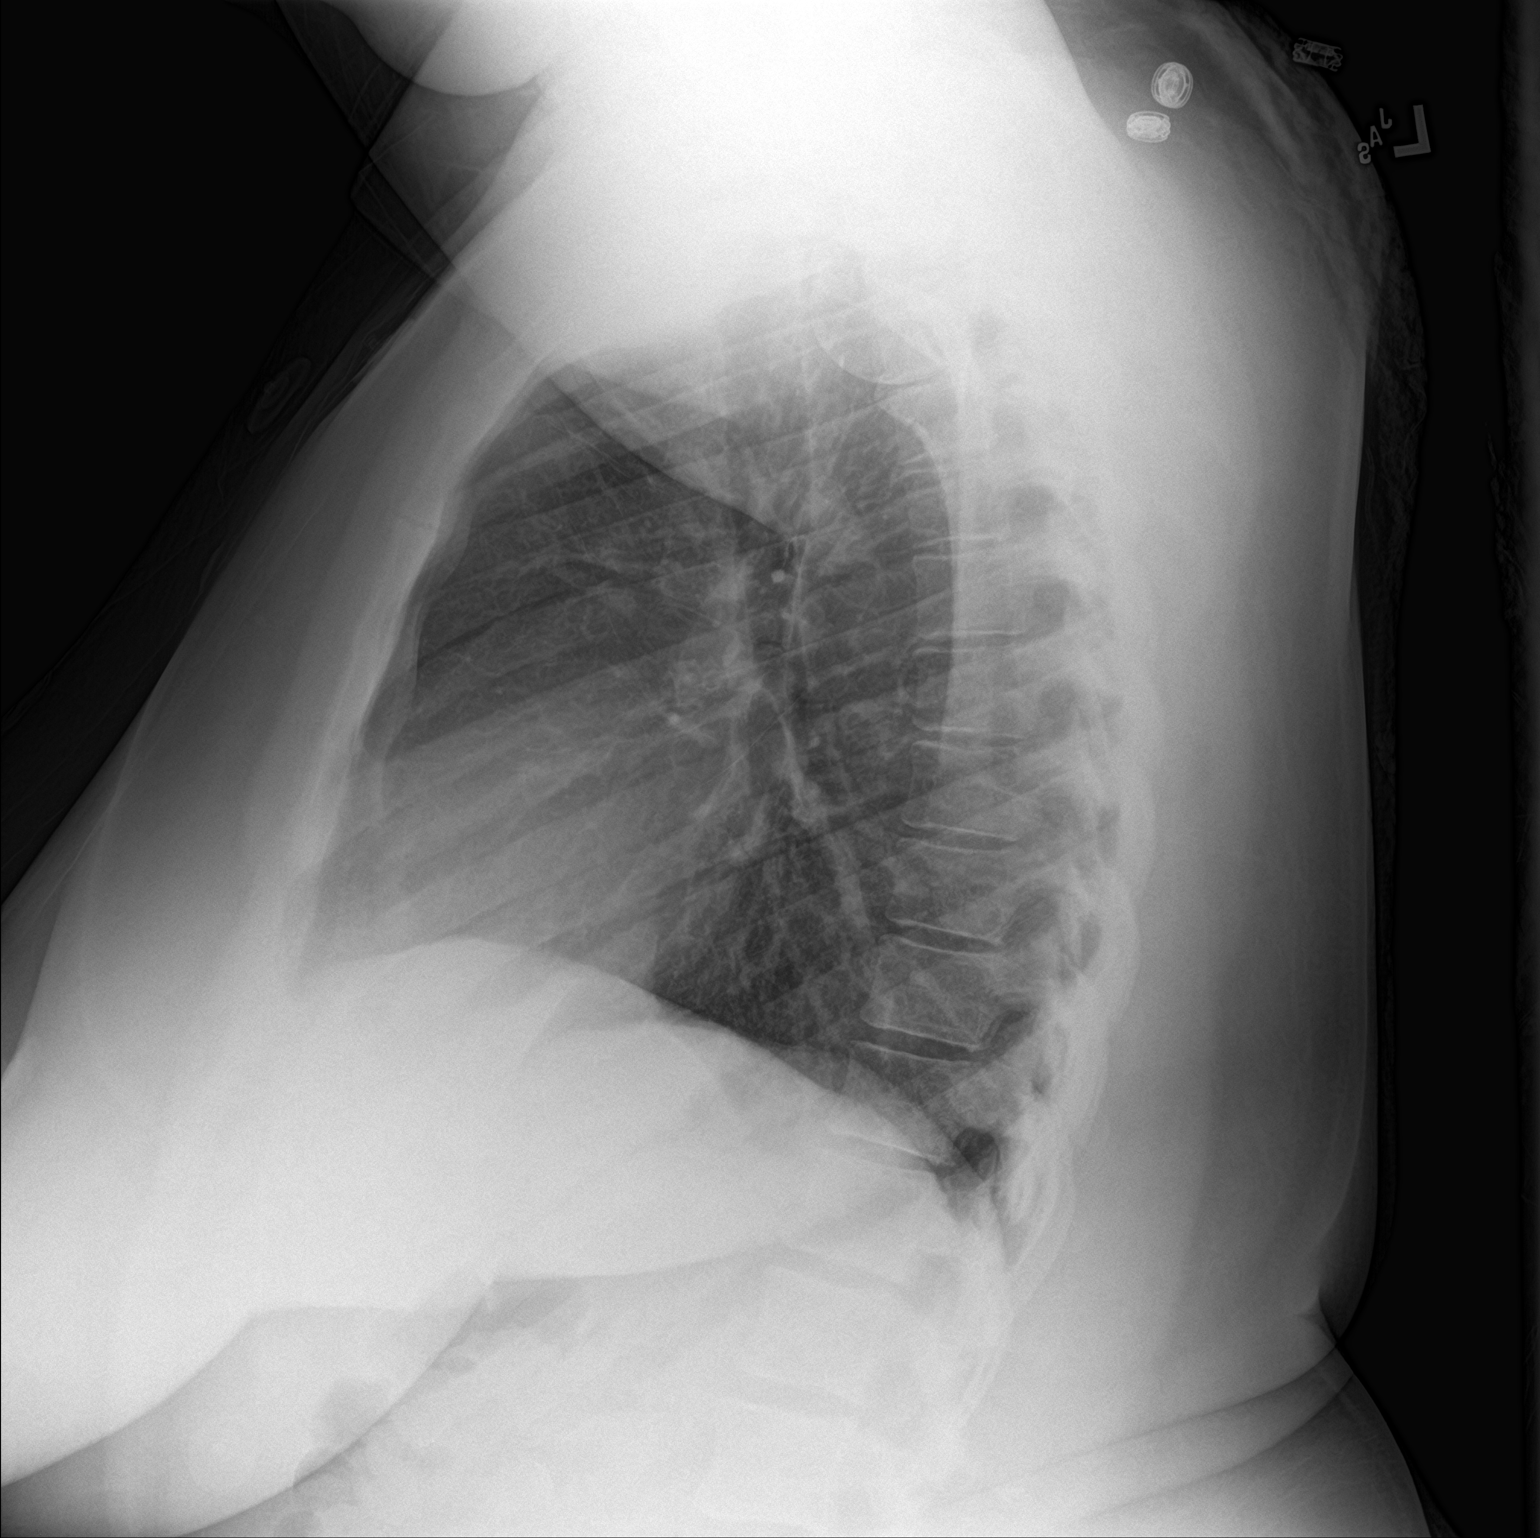

[2 of 2 positions shown; findings below may reference images not displayed]

FINDINGS: The heart size and mediastinal contours are within normal limits.
Both lungs are clear. The visualized skeletal structures are
unremarkable.
IMPRESSION: No active cardiopulmonary disease.

## 2017-12-02 ENCOUNTER — Ambulatory Visit (INDEPENDENT_AMBULATORY_CARE_PROVIDER_SITE_OTHER): Payer: Federal, State, Local not specified - PPO | Admitting: Orthopedic Surgery

## 2017-12-02 ENCOUNTER — Encounter (INDEPENDENT_AMBULATORY_CARE_PROVIDER_SITE_OTHER): Payer: Self-pay | Admitting: Orthopedic Surgery

## 2017-12-02 VITALS — Ht 65.0 in | Wt 292.0 lb

## 2017-12-02 DIAGNOSIS — M25872 Other specified joint disorders, left ankle and foot: Secondary | ICD-10-CM | POA: Diagnosis not present

## 2017-12-02 MED ORDER — METHYLPREDNISOLONE ACETATE 40 MG/ML IJ SUSP
40.0000 mg | INTRAMUSCULAR | Status: AC | PRN
  Administered 2017-12-02: 40 mg via INTRA_ARTICULAR

## 2017-12-02 MED ORDER — LIDOCAINE HCL 1 % IJ SOLN
2.0000 mL | INTRAMUSCULAR | Status: AC | PRN
  Administered 2017-12-02: 2 mL

## 2017-12-02 NOTE — Progress Notes (Signed)
Office Visit Note   Patient: Glenda Mendez           Date of Birth: [birth year]           MRN: [id number] Visit Date: 12/02/2017              Requested by: Burnis Medin, MD [address] [place], [place] [address] PCP: Burnis Medin, MD  Chief Complaint  Patient presents with  . Left Ankle - Pain      HPI: Patient is a 27 year old woman who states she fell off her bed and felt a pop in her ankle.  She states she is been having chronic pain since that time she is seeing Dr. Paulla Fore has had a injection for a ganglion cyst in the posterior aspect of her ankle joint she obtain an MRI scan in October 2018.  Patient states she has pain anteriorly laterally over the ankle joint and states she has to walk on the side of her foot.  BMI greater than 48.  Assessment & Plan: Visit Diagnoses:  1. Impingement of left ankle joint     Plan: The ankle was injected from the anterior medial portal he tolerated this well plan follow-up in 3 weeks.  Discussed that we may need to consider arthroscopic intervention for debridement of the impingement.  Follow-Up Instructions: Return in about 3 weeks (around 12/23/2017).   Ortho Exam  Patient is alert, oriented, no adenopathy, well-dressed, normal affect, normal respiratory effort. Examination she has a good dorsalis pedis pulse she has good ankle and subtalar motion bilaterally.  She is most tender to palpation of the anterior lateral joint line anterior drawer is stable with no laxity of the anterior talofibular ligament.  The peroneal and posterior tibial tendons are nontender to palpation the Achilles tendon is nontender to palpation she has minimal tenderness to palpation posteriorly to the ankle joint.  She does have some tenderness to palpation over the sinus Tarsi.  Review of the MRI scan shows soft tissue swelling in the ankle joint anteriorly does show some fluid posteriorly in the ankle joint.  There is no osteochondral defects no  fractures.  Imaging: No results found. No images are attached to the encounter.  Labs: Lab Results  Component Value Date   HGBA1C 6.6 (H) 03/23/2017   HGBA1C 6.1 06/26/2016   HGBA1C 6.1 07/03/2015   ESRSEDRATE 17 07/03/2015   REPTSTATUS 03/14/2014 FINAL 03/12/2014   CULT  03/12/2014    No Beta Hemolytic Streptococci Isolated Performed at [facility]    @LABSALLVALUES (HGBA1)@  Body mass index is 48.59 kg/m.  Orders:  No orders of the defined types were placed in this encounter.  No orders of the defined types were placed in this encounter.    Procedures: Medium Joint Inj: L ankle on 12/02/2017 3:16 PM Indications: pain and diagnostic evaluation Details: 22 G 1.5 in needle, anteromedial approach Medications: 2 mL lidocaine 1 %; 40 mg methylPREDNISolone acetate 40 MG/ML Outcome: tolerated well, no immediate complications Procedure, treatment alternatives, risks and benefits explained, specific risks discussed. Consent was given by the patient. Immediately prior to procedure a time out was called to verify the correct patient, procedure, equipment, support staff and site/side marked as required. Patient was prepped and draped in the usual sterile fashion.      Clinical Data: No additional findings.  ROS:  All other systems negative, except as noted in the HPI. Review of Systems  Objective: Vital Signs: Ht 5\' 5"  (1.651  m)   Wt 292 lb (132.5 kg)   LMP 11/11/2017   BMI 48.59 kg/m   Specialty Comments:  No specialty comments available.  PMFS History: Patient Active Problem List   Diagnosis Date Noted  . Impingement of left ankle joint 12/02/2017  . Pelvic pain 11/16/2017  . Left ankle pain 06/23/2017  . Ganglion cyst of flexor tendon sheath of finger of right hand 02/02/2017  . Trigger finger, right middle finger 02/02/2017  . Eczema 07/11/2016  . Pruritus 06/26/2016  . Dyspnea and respiratory abnormality 05/08/2016  . Wheezing 05/08/2016  .  Elevated testosterone level in female 08/28/2015  . Elevated hemoglobin A1c 08/28/2015  . Abdominal pain, right upper quadrant 06/05/2015  . Abnormal urine 06/05/2015  . Abnormal alkaline phosphatase test 04/27/2015  . Severe obesity (BMI >= 40) ([place]) 12/19/2014  . Asthma with acute exacerbation 08/29/2014  . Moderate intermittent asthma, uncomplicated 09/73/5329  . Rash, skin 06/20/2014  . Moderate persistent asthma with acute exacerbation in adult 05/27/2014  . Anterior chest wall pain 05/27/2014  . Low grade squamous intraepithelial lesion (LGSIL) on cervical Pap smear 12/02/2013  . Obesity (BMI 30-39.9) 11/28/2013  . Visit for preventive health examination 11/28/2013  . Routine gynecological examination 11/28/2013  . Weight gain 11/28/2013  . Asthma 11/28/2013  . Moderate persistent asthma without complication 92/42/6834  . Hemiparesis ([place])   . Oral contraceptive pill surveillance 04/25/2012  . Headache(784.0) 06/04/2011  . Irregular periods 06/04/2011  . Acne 06/04/2011  . BCP (birth control pills) initiation 06/04/2011  . Preventative health care 02/18/2011  . Neoplasm of uncertain behavior of skin 02/18/2011  . BMI (body mass index), pediatric, 95-99% for age 42/10/2010  . BACK PAIN 09/17/2010  . NECK PAIN 01/02/2009  . FATIGUE 12/06/2008  . Attention deficit hyperactivity disorder (ADHD) 09/03/2007  . UNSPECIFIED HEARING LOSS 09/03/2007  . ALLERGIC RHINITIS 05/19/2007  . ASTHMA 05/19/2007   Past Medical History:  Diagnosis Date  . ADHD (attention deficit hyperactivity disorder)   . Allergy   . Asthma   . BMI (body mass index), pediatric, 95-99% for age 32/10/2010   Is starting to gain weight again and advised and counseled today about reduction for health risk reasons. Patient is very well aware of how to do lifestyle intervention she has done this before.   Marland Kitchen Hearing loss in right ear    with hearing aid bilateral   . Hemiparesis ([place])    rt from neonatal period    . Hypoxia of newborn   . Irregular periods 06/04/2011  . PCOS (polycystic ovarian syndrome)   . Pulmonary hemorrhage of fetus or newborn    65 week C-section [facility] of with birth hypoxia    Family History  Adopted: Yes  Problem Relation Age of Onset  . Hypertension Unknown   . Schizophrenia Mother        with alcohol drug abuse  . Diabetes Other     Past Surgical History:  Procedure Laterality Date  . TONSILLECTOMY     Social History   Occupational History  . Not on file  Tobacco Use  . Smoking status: Never Smoker  . Smokeless tobacco: Never Used  Substance and Sexual Activity  . Alcohol use: No    Alcohol/week: 0.0 oz  . Drug use: No  . Sexual activity: No    Birth control/protection: Pill

## 2017-12-21 ENCOUNTER — Ambulatory Visit (INDEPENDENT_AMBULATORY_CARE_PROVIDER_SITE_OTHER): Payer: Federal, State, Local not specified - PPO | Admitting: Orthopedic Surgery

## 2017-12-21 ENCOUNTER — Encounter (INDEPENDENT_AMBULATORY_CARE_PROVIDER_SITE_OTHER): Payer: Self-pay | Admitting: Orthopedic Surgery

## 2017-12-21 VITALS — Ht 65.0 in | Wt 292.0 lb

## 2017-12-21 DIAGNOSIS — M25872 Other specified joint disorders, left ankle and foot: Secondary | ICD-10-CM

## 2017-12-21 NOTE — Progress Notes (Signed)
Office Visit Note   Patient: Glenda Mendez           Date of Birth: 30-Dec-1990           MRN: [id number] Visit Date: 12/21/2017              Requested by: Burnis Medin, MD [address] [place], [place] [address] PCP: Burnis Medin, MD  Chief Complaint  Patient presents with  . Left Ankle - Follow-up      HPI: Patient is a 27 year old woman who presents status post left ankle injection for impingement.  Patient states she did get temporary relief she states she has still pain with activities pain with changes in weather states she still has swelling.  He states that anti-inflammatories have provided temporary relief.  Assessment & Plan: Visit Diagnoses:  1. Impingement of left ankle joint     Plan: Due to temporary relief of her symptoms however still with persistent pain with activities of daily living patient states she would like to proceed with surgical intervention.  We will plan for arthroscopic debridement of the left ankle.  Risk and benefits were discussed including infection neurovascular injury persistent pain need for additional surgery.  Patient states she understands wished to proceed at this time.  Follow-Up Instructions: Return in about 2 weeks (around 01/04/2018).   Ortho Exam  Patient is alert, oriented, no adenopathy, well-dressed, normal affect, normal respiratory effort. Examination patient has a good dorsalis pedis pulse she has good ankle good subtalar motion anterior drawer is stable.  The peroneal and posterior tibial tendons are nontender to palpation the Achilles is nontender to palpation.  She has swelling and is point tender over the anterior and anterior lateral joint line.  Palpation reproduces her symptoms.  There is no redness no cellulitis no signs of gout or infection.  Imaging: No results found. No images are attached to the encounter.  Labs: Lab Results  Component Value Date   HGBA1C 6.6 (H) 03/23/2017   HGBA1C 6.1  06/26/2016   HGBA1C 6.1 07/03/2015   ESRSEDRATE 17 07/03/2015   REPTSTATUS 03/14/2014 FINAL 03/12/2014   CULT  03/12/2014    No Beta Hemolytic Streptococci Isolated Performed at [facility]    @LABSALLVALUES (HGBA1)@  Body mass index is 48.59 kg/m.  Orders:  No orders of the defined types were placed in this encounter.  No orders of the defined types were placed in this encounter.    Procedures: No procedures performed  Clinical Data: No additional findings.  ROS:  All other systems negative, except as noted in the HPI. Review of Systems  Objective: Vital Signs: Ht 5\' 5"  (1.651 m)   Wt 292 lb (132.5 kg)   BMI 48.59 kg/m   Specialty Comments:  No specialty comments available.  PMFS History: Patient Active Problem List   Diagnosis Date Noted  . Impingement of left ankle joint 12/02/2017  . Pelvic pain 11/16/2017  . Left ankle pain 06/23/2017  . Ganglion cyst of flexor tendon sheath of finger of right hand 02/02/2017  . Trigger finger, right middle finger 02/02/2017  . Eczema 07/11/2016  . Pruritus 06/26/2016  . Dyspnea and respiratory abnormality 05/08/2016  . Wheezing 05/08/2016  . Elevated testosterone level in female 08/28/2015  . Elevated hemoglobin A1c 08/28/2015  . Abdominal pain, right upper quadrant 06/05/2015  . Abnormal urine 06/05/2015  . Abnormal alkaline phosphatase test 04/27/2015  . Severe obesity (BMI >= 40) ([place]) 12/19/2014  . Asthma with acute  exacerbation 08/29/2014  . Moderate intermittent asthma, uncomplicated 84/69/6295  . Rash, skin 06/20/2014  . Moderate persistent asthma with acute exacerbation in adult 05/27/2014  . Anterior chest wall pain 05/27/2014  . Low grade squamous intraepithelial lesion (LGSIL) on cervical Pap smear 12/02/2013  . Obesity (BMI 30-39.9) 11/28/2013  . Visit for preventive health examination 11/28/2013  . Routine gynecological examination 11/28/2013  . Weight gain 11/28/2013  . Asthma 11/28/2013    . Moderate persistent asthma without complication 28/41/3244  . Hemiparesis ([employer])   . Oral contraceptive pill surveillance 04/25/2012  . Headache(784.0) 06/04/2011  . Irregular periods 06/04/2011  . Acne 06/04/2011  . BCP (birth control pills) initiation 06/04/2011  . Preventative health care 02/18/2011  . Neoplasm of uncertain behavior of skin 02/18/2011  . BMI (body mass index), pediatric, 95-99% for age 805/10/2010  . BACK PAIN 09/17/2010  . NECK PAIN 01/02/2009  . FATIGUE 12/06/2008  . Attention deficit hyperactivity disorder (ADHD) 09/03/2007  . UNSPECIFIED HEARING LOSS 09/03/2007  . ALLERGIC RHINITIS 05/19/2007  . ASTHMA 05/19/2007   Past Medical History:  Diagnosis Date  . ADHD (attention deficit hyperactivity disorder)   . Allergy   . Asthma   . BMI (body mass index), pediatric, 95-99% for age 80/10/2010   Is starting to gain weight again and advised and counseled today about reduction for health risk reasons. Patient is very well aware of how to do lifestyle intervention she has done this before.   Marland Kitchen Hearing loss in right ear    with hearing aid bilateral   . Hemiparesis ([place])    rt from neonatal period  . Hypoxia of newborn   . Irregular periods 06/04/2011  . PCOS (polycystic ovarian syndrome)   . Pulmonary hemorrhage of fetus or newborn    73 week C-section [facility] of with birth hypoxia    Family History  Adopted: Yes  Problem Relation Age of Onset  . Hypertension Unknown   . Schizophrenia Mother        with alcohol drug abuse  . Diabetes Other     Past Surgical History:  Procedure Laterality Date  . TONSILLECTOMY     Social History   Occupational History  . Not on file  Tobacco Use  . Smoking status: Never Smoker  . Smokeless tobacco: Never Used  Substance and Sexual Activity  . Alcohol use: No    Alcohol/week: 0.0 oz  . Drug use: No  . Sexual activity: No    Birth control/protection: Pill

## 2017-12-22 ENCOUNTER — Ambulatory Visit: Payer: Federal, State, Local not specified - PPO | Admitting: Internal Medicine

## 2018-01-12 DIAGNOSIS — M659 Synovitis and tenosynovitis, unspecified: Secondary | ICD-10-CM | POA: Diagnosis not present

## 2018-01-12 DIAGNOSIS — M25872 Other specified joint disorders, left ankle and foot: Secondary | ICD-10-CM | POA: Diagnosis not present

## 2018-01-12 DIAGNOSIS — M948X7 Other specified disorders of cartilage, ankle and foot: Secondary | ICD-10-CM | POA: Diagnosis not present

## 2018-01-12 DIAGNOSIS — M19172 Post-traumatic osteoarthritis, left ankle and foot: Secondary | ICD-10-CM | POA: Diagnosis not present

## 2018-01-26 ENCOUNTER — Ambulatory Visit (INDEPENDENT_AMBULATORY_CARE_PROVIDER_SITE_OTHER): Payer: Federal, State, Local not specified - PPO | Admitting: Orthopedic Surgery

## 2018-01-26 ENCOUNTER — Encounter (INDEPENDENT_AMBULATORY_CARE_PROVIDER_SITE_OTHER): Payer: Self-pay | Admitting: Orthopedic Surgery

## 2018-01-26 DIAGNOSIS — M25872 Other specified joint disorders, left ankle and foot: Secondary | ICD-10-CM

## 2018-01-26 NOTE — Progress Notes (Signed)
Office Visit Note   Patient: Glenda Mendez           Date of Birth: [birth year]           MRN: [id number] Visit Date: 01/26/2018              Requested by: Burnis Medin, MD [address] [place], [place] [address] PCP: Burnis Medin, MD  Chief Complaint  Patient presents with  . Left Ankle - Routine Post Op      HPI: Patient presents in follow-up status post left ankle arthroscopy she is a week out.  Patient states she does have a little stiffness with her ankle.  Assessment & Plan: Visit Diagnoses:  1. Impingement of left ankle joint     Plan: Patient was given instructions for dorsiflexion stretching of the ankle to do 100 times a day to increase her activities as tolerated.  Patient states she does not feel comfortable being able to return to work at this time she states she stands on her feet on hard concrete floors for at least 5 hours a day.  Reevaluate at follow-up for return to work.  Follow-Up Instructions: Return in about 1 month (around 02/23/2018).   Ortho Exam  Patient is alert, oriented, no adenopathy, well-dressed, normal affect, normal respiratory effort. Examination there is no redness no cellulitis no drainage the incisions are well-healed there is no swelling.  Patient's arthroscopic findings showed significant amount of synovitis with good intact articular cartilage.  Imaging: No results found. No images are attached to the encounter.  Labs: Lab Results  Component Value Date   HGBA1C 6.6 (H) 03/23/2017   HGBA1C 6.1 06/26/2016   HGBA1C 6.1 07/03/2015   ESRSEDRATE 17 07/03/2015   REPTSTATUS 03/14/2014 FINAL 03/12/2014   CULT  03/12/2014    No Beta Hemolytic Streptococci Isolated Performed at [facility]    @LABSALLVALUES (HGBA1)@  There is no height or weight on file to calculate BMI.  Orders:  No orders of the defined types were placed in this encounter.  No orders of the defined types were placed in this  encounter.    Procedures: No procedures performed  Clinical Data: No additional findings.  ROS:  All other systems negative, except as noted in the HPI. Review of Systems  Objective: Vital Signs: There were no vitals taken for this visit.  Specialty Comments:  No specialty comments available.  PMFS History: Patient Active Problem List   Diagnosis Date Noted  . Impingement of left ankle joint 12/02/2017  . Pelvic pain 11/16/2017  . Left ankle pain 06/23/2017  . Ganglion cyst of flexor tendon sheath of finger of right hand 02/02/2017  . Trigger finger, right middle finger 02/02/2017  . Eczema 07/11/2016  . Pruritus 06/26/2016  . Dyspnea and respiratory abnormality 05/08/2016  . Wheezing 05/08/2016  . Elevated testosterone level in female 08/28/2015  . Elevated hemoglobin A1c 08/28/2015  . Abdominal pain, right upper quadrant 06/05/2015  . Abnormal urine 06/05/2015  . Abnormal alkaline phosphatase test 04/27/2015  . Severe obesity (BMI >= 40) ([place]) 12/19/2014  . Asthma with acute exacerbation 08/29/2014  . Moderate intermittent asthma, uncomplicated 65/78/4696  . Rash, skin 06/20/2014  . Moderate persistent asthma with acute exacerbation in adult 05/27/2014  . Anterior chest wall pain 05/27/2014  . Low grade squamous intraepithelial lesion (LGSIL) on cervical Pap smear 12/02/2013  . Obesity (BMI 30-39.9) 11/28/2013  . Visit for preventive health examination 11/28/2013  . Routine gynecological examination 11/28/2013  .  Weight gain 11/28/2013  . Asthma 11/28/2013  . Moderate persistent asthma without complication 45/40/9811  . Hemiparesis ([place])   . Oral contraceptive pill surveillance 04/25/2012  . Headache(784.0) 06/04/2011  . Irregular periods 06/04/2011  . Acne 06/04/2011  . BCP (birth control pills) initiation 06/04/2011  . Preventative health care 02/18/2011  . Neoplasm of uncertain behavior of skin 02/18/2011  . BMI (body mass index), pediatric, 95-99% for  age 13/10/2010  . BACK PAIN 09/17/2010  . NECK PAIN 01/02/2009  . FATIGUE 12/06/2008  . Attention deficit hyperactivity disorder (ADHD) 09/03/2007  . UNSPECIFIED HEARING LOSS 09/03/2007  . ALLERGIC RHINITIS 05/19/2007  . ASTHMA 05/19/2007   Past Medical History:  Diagnosis Date  . ADHD (attention deficit hyperactivity disorder)   . Allergy   . Asthma   . BMI (body mass index), pediatric, 95-99% for age 87/10/2010   Is starting to gain weight again and advised and counseled today about reduction for health risk reasons. Patient is very well aware of how to do lifestyle intervention she has done this before.   [facility] Hearing loss in right ear    with hearing aid bilateral   . Hemiparesis ([place])    rt from neonatal period  . Hypoxia of newborn   . Irregular periods 06/04/2011  . PCOS (polycystic ovarian syndrome)   . Pulmonary hemorrhage of fetus or newborn    87 week C-section [facility] of with birth hypoxia    Family History  Adopted: Yes  Problem Relation Age of Onset  . Hypertension Unknown   . Schizophrenia Mother        with alcohol drug abuse  . Diabetes Other     Past Surgical History:  Procedure Laterality Date  . TONSILLECTOMY     Social History   Occupational History  . Not on file  Tobacco Use  . Smoking status: Never Smoker  . Smokeless tobacco: Never Used  Substance and Sexual Activity  . Alcohol use: No    Alcohol/week: 0.0 oz  . Drug use: No  . Sexual activity: Never    Birth control/protection: Pill

## 2018-02-10 ENCOUNTER — Ambulatory Visit: Payer: Federal, State, Local not specified - PPO | Admitting: Internal Medicine

## 2018-02-10 ENCOUNTER — Encounter: Payer: Self-pay | Admitting: Internal Medicine

## 2018-02-10 VITALS — BP 120/78 | HR 92 | Temp 98.5°F | Resp 18 | Wt 293.0 lb

## 2018-02-10 DIAGNOSIS — J4520 Mild intermittent asthma, uncomplicated: Secondary | ICD-10-CM

## 2018-02-10 DIAGNOSIS — J301 Allergic rhinitis due to pollen: Secondary | ICD-10-CM | POA: Diagnosis not present

## 2018-02-10 DIAGNOSIS — IMO0001 Reserved for inherently not codable concepts without codable children: Secondary | ICD-10-CM

## 2018-02-10 DIAGNOSIS — J019 Acute sinusitis, unspecified: Secondary | ICD-10-CM | POA: Diagnosis not present

## 2018-02-10 MED ORDER — AMOXICILLIN-POT CLAVULANATE 875-125 MG PO TABS: 1.0000 | ORAL_TABLET | Freq: Two times a day (BID) | ORAL | 0 refills | Status: DC

## 2018-02-10 NOTE — Patient Instructions (Signed)
Stay on the nasal coritosone   And saline nose spray   To help with congestion  Add antibiotic as planned . Chest sound ok today .   Expect face pain to et better in  About . 3-4 days    Sinusitis, Adult Sinusitis is soreness and inflammation of your sinuses. Sinuses are hollow spaces in the bones around your face. Your sinuses are located:  Around your eyes.  In the middle of your forehead.  Behind your nose.  In your cheekbones.  Your sinuses and nasal passages are lined with a stringy fluid (mucus). Mucus normally drains out of your sinuses. When your nasal tissues become inflamed or swollen, the mucus can become trapped or blocked so air cannot flow through your sinuses. This allows bacteria, viruses, and funguses to grow, which leads to infection. Sinusitis can develop quickly and last for 7?10 days (acute) or for more than 12 weeks (chronic). Sinusitis often develops after a cold. What are the causes? This condition is caused by anything that creates swelling in the sinuses or stops mucus from draining, including:  Allergies.  Asthma.  Bacterial or viral infection.  Abnormally shaped bones between the nasal passages.  Nasal growths that contain mucus (nasal polyps).  Narrow sinus openings.  Pollutants, such as chemicals or irritants in the air.  A foreign object stuck in the nose.  A fungal infection. This is rare.  What increases the risk? The following factors may make you more likely to develop this condition:  Having allergies or asthma.  Having had a recent cold or respiratory tract infection.  Having structural deformities or blockages in your nose or sinuses.  Having a weak immune system.  Doing a lot of swimming or diving.  Overusing nasal sprays.  Smoking.  What are the signs or symptoms? The main symptoms of this condition are pain and a feeling of pressure around the affected sinuses. Other symptoms include:  Upper  toothache.  Earache.  Headache.  Bad breath.  Decreased sense of smell and taste.  A cough that may get worse at night.  Fatigue.  Fever.  Thick drainage from your nose. The drainage is often green and it may contain pus (purulent).  Stuffy nose or congestion.  Postnasal drip. This is when extra mucus collects in the throat or back of the nose.  Swelling and warmth over the affected sinuses.  Sore throat.  Sensitivity to light.  How is this diagnosed? This condition is diagnosed based on symptoms, a medical history, and a physical exam. To find out if your condition is acute or chronic, your health care provider may:  Look in your nose for signs of nasal polyps.  Tap over the affected sinus to check for signs of infection.  View the inside of your sinuses using an imaging device that has a light attached (endoscope).  If your health care provider suspects that you have chronic sinusitis, you may also:  Be tested for allergies.  Have a sample of mucus taken from your nose (nasal culture) and checked for bacteria.  Have a mucus sample examined to see if your sinusitis is related to an allergy.  If your sinusitis does not respond to treatment and it lasts longer than 8 weeks, you may have an MRI or CT scan to check your sinuses. These scans also help to determine how severe your infection is. In rare cases, a bone biopsy may be done to rule out more serious types of fungal sinus disease. How  is this treated? Treatment for sinusitis depends on the cause and whether your condition is chronic or acute. If a virus is causing your sinusitis, your symptoms will go away on their own within 10 days. You may be given medicines to relieve your symptoms, including:  Topical nasal decongestants. They shrink swollen nasal passages and let mucus drain from your sinuses.  Antihistamines. These drugs block inflammation that is triggered by allergies. This can help to ease swelling in  your nose and sinuses.  Topical nasal corticosteroids. These are nasal sprays that ease inflammation and swelling in your nose and sinuses.  Nasal saline washes. These rinses can help to get rid of thick mucus in your nose.  If your condition is caused by bacteria, you will be given an antibiotic medicine. If your condition is caused by a fungus, you will be given an antifungal medicine. Surgery may be needed to correct underlying conditions, such as narrow nasal passages. Surgery may also be needed to remove polyps. Follow these instructions at home: Medicines  Take, use, or apply over-the-counter and prescription medicines only as told by your health care provider. These may include nasal sprays.  If you were prescribed an antibiotic medicine, take it as told by your health care provider. Do not stop taking the antibiotic even if you start to feel better. Hydrate and Humidify  Drink enough water to keep your urine clear or pale yellow. Staying hydrated will help to thin your mucus.  Use a cool mist humidifier to keep the humidity level in your home above 50%.  Inhale steam for 10-15 minutes, 3-4 times a day or as told by your health care provider. You can do this in the bathroom while a hot shower is running.  Limit your exposure to cool or dry air. Rest  Rest as much as possible.  Sleep with your head raised (elevated).  Make sure to get enough sleep each night. General instructions  Apply a warm, moist washcloth to your face 3-4 times a day or as told by your health care provider. This will help with discomfort.  Wash your hands often with soap and water to reduce your exposure to viruses and other germs. If soap and water are not available, use hand sanitizer.  Do not smoke. Avoid being around people who are smoking (secondhand smoke).  Keep all follow-up visits as told by your health care provider. This is important. Contact a health care provider if:  You have a  fever.  Your symptoms get worse.  Your symptoms do not improve within 10 days. Get help right away if:  You have a severe headache.  You have persistent vomiting.  You have pain or swelling around your face or eyes.  You have vision problems.  You develop confusion.  Your neck is stiff.  You have trouble breathing. This information is not intended to replace advice given to you by your health care provider. Make sure you discuss any questions you have with your health care provider. Document Released: 10/06/2005 Document Revised: 06/01/2016 Document Reviewed: 08/01/2015 Elsevier Interactive Patient Education  [employer].

## 2018-02-10 NOTE — Progress Notes (Signed)
Chief Complaint  Patient presents with  . Sinusitis    Started last week. Cough w/ thick clear to yellow mucous.    HPI: Glenda Mendez 27 y.o.  sda   Concern about sinus infection  10 days of sx .  Ur congseion and now having face pain and headaches   No fever Asthma  Ok  Having throat and  Face and drinage.  Thick  With pnd ocass blood fleck  Mom noted she was more tired    More moping .  She is post op from ankle foot surgery still hurts  Out of work for 4 weeks or so   Asthma stable so far      ROS: See pertinent positives and negatives per HPI.  Past Medical History:  Diagnosis Date  . ADHD (attention deficit hyperactivity disorder)   . Allergy   . Asthma   . BMI (body mass index), pediatric, 95-99% for age 79/10/2010   Is starting to gain weight again and advised and counseled today about reduction for health risk reasons. Patient is very well aware of how to do lifestyle intervention she has done this before.   Marland Kitchen Hearing loss in right ear    with hearing aid bilateral   . Hemiparesis ([place])    rt from neonatal period  . Hypoxia of newborn   . Irregular periods 06/04/2011  . PCOS (polycystic ovarian syndrome)   . Pulmonary hemorrhage of fetus or newborn    78 week C-section [facility] of with birth hypoxia    Family History  Adopted: Yes  Problem Relation Age of Onset  . Hypertension Unknown   . Schizophrenia Mother        with alcohol drug abuse  . Diabetes Other     Social History   Socioeconomic History  . Marital status: Single    Spouse name: Not on file  . Number of children: Not on file  . Years of education: Not on file  . Highest education level: Not on file  Occupational History  . Not on file  Social Needs  . Financial resource strain: Not on file  . Food insecurity:    Worry: Not on file    Inability: Not on file  . Transportation needs:    Medical: Not on file    Non-medical: Not on file  Tobacco Use  . Smoking status: Never Smoker  .  Smokeless tobacco: Never Used  Substance and Sexual Activity  . Alcohol use: No    Alcohol/week: 0.0 oz  . Drug use: No  . Sexual activity: Never    Birth control/protection: Pill  Lifestyle  . Physical activity:    Days per week: Not on file    Minutes per session: Not on file  . Stress: Not on file  Relationships  . Social connections:    Talks on phone: Not on file    Gets together: Not on file    Attends religious service: Not on file    Active member of club or organization: Not on file    Attends meetings of clubs or organizations: Not on file    Relationship status: Not on file  Other Topics Concern  . Not on file  Social History Narrative   Adopted and raised by family member [name]   [employer] high school  graduate [employer] second semester   Living at home with mom and has no car   Sleep 8+ hours   Seeing Dr.  Majure- pulm at [facility] now has a new pulmonary Dr. Now stable and on prn       Thyroid evaluation in the past neck evaluation within normal limits and no thyroid disease.    Outpatient Medications Prior to Visit  Medication Sig Dispense Refill  . albuterol (PROVENTIL HFA;VENTOLIN HFA) 108 (90 BASE) MCG/ACT inhaler Inhale 2 puffs into the lungs every 6 (six) hours as needed for wheezing or shortness of breath. 1 Inhaler 4  . albuterol (PROVENTIL) (2.5 MG/3ML) 0.083% nebulizer solution Take 3 mLs (2.5 mg total) by nebulization every 4 (four) hours as needed for wheezing or shortness of breath. 75 mL 2  . benzonatate (TESSALON PERLES) 100 MG capsule Take 1 capsule (100 mg total) by mouth 3 (three) times daily as needed. 20 capsule 0  . diclofenac (VOLTAREN) 75 MG EC tablet Take 1 tablet (75 mg total) by mouth 2 (two) times daily. Take 1 tab bid X 10 days then as needed 30 tablet 0  . fluticasone (FLONASE) 50 MCG/ACT nasal spray Place 2 sprays into both nostrils daily. 16 g 6  . fluticasone-salmeterol (ADVAIR HFA) 230-21 MCG/ACT inhaler Inhale 2 puffs into  the lungs 2 (two) times daily.    . hydrOXYzine (ATARAX/VISTARIL) 10 MG tablet Take 1 tablet (10 mg total) by mouth daily as needed for itching. 30 tablet 0  . ipratropium (ATROVENT) 0.02 % nebulizer solution USE 1 VIAL BY NEBULIZATION 3 (THREE) TIMES DAILY AS NEEDED FOR WHEEZING. 62.5 mL 0  . levocetirizine (XYZAL ALLERGY 24HR) 5 MG tablet Take 1 tablet (5 mg total) by mouth every evening. 90 tablet 3  . metFORMIN (GLUCOPHAGE-XR) 500 MG 24 hr tablet Take 1 tablet (500 mg total) by mouth daily with breakfast. Or largest meal increae to 1 twice a day after 2 weeks 60 tablet 5  . methylphenidate 36 MG PO CR tablet TAKE (2) TABLETS DAILY. 60 tablet 0  . Naproxen-Esomeprazole (VIMOVO) 500-20 MG TBEC Take 1 tablet by mouth 2 (two) times daily. 60 tablet 2  . norgestimate-ethinyl estradiol (ORTHO-CYCLEN, 28,) 0.25-35 MG-MCG tablet Take 1 tablet by mouth daily. 1 Package 5  . omeprazole (PRILOSEC) 20 MG capsule Take 1 capsule (20 mg total) by mouth 2 (two) times daily before a meal. 60 capsule 3  . pantoprazole (PROTONIX) 40 MG tablet Take 1 tablet (40 mg total) by mouth daily. 30 tablet 0  . triamcinolone cream (KENALOG) 0.5 % Apply topically 2 (two) times daily. Use as directed 30 g 3   No facility-administered medications prior to visit.      EXAM:  BP 120/78   Pulse 92   Temp 98.5 F (36.9 C) (Oral)   Resp 18   Wt 293 lb (132.9 kg)   SpO2 97%   BMI 48.76 kg/m   Body mass index is 48.76 kg/m. WDWN in NAD  quiet respirations; mildly congested  somewhat hoarse. Non toxic . HEENT: Normocephalic ;atraumatic , Eyes;  PERRL, EOMs  Full, lids and conjunctiva clear,,Ears: no deformities, canals nl, TM landmarks normal, Nose: no deformity or discharge but congested;face frontal mid  tender Mouth : OP clear without lesion or edema . Neck: Supple without adenopathy or masses or bruits Chest:  Clear to A without wheezes rales or rhonchi CV:  S1-S2 no gallops or murmurs peripheral perfusion is  normal Skin :nl perfusion and no acute rashes     ASSESSMENT AND PLAN:  Discussed the following assessment and plan:  Acute sinusitis, recurrence not specified, unspecified location  Moderate intermittent asthma, uncomplicated  Morbid obesity, unspecified obesity type ([place])  Seasonal allergic rhinitis due to pollen Add Augmentin  Stay on controllers meds  fortunately asthma not flaring    Badly     Expectant management.   For improvement   Continue    healthy weight loss to help recover from her le predicament -Patient advised to return or notify health care team  if symptoms worsen ,persist or new concerns arise.  Patient Instructions  Stay on the nasal coritosone   And saline nose spray   To help with congestion  Add antibiotic as planned . Chest sound ok today .   Expect face pain to et better in  About . 3-4 days    Sinusitis, Adult Sinusitis is soreness and inflammation of your sinuses. Sinuses are hollow spaces in the bones around your face. Your sinuses are located:  Around your eyes.  In the middle of your forehead.  Behind your nose.  In your cheekbones.  Your sinuses and nasal passages are lined with a stringy fluid (mucus). Mucus normally drains out of your sinuses. When your nasal tissues become inflamed or swollen, the mucus can become trapped or blocked so air cannot flow through your sinuses. This allows bacteria, viruses, and funguses to grow, which leads to infection. Sinusitis can develop quickly and last for 7?10 days (acute) or for more than 12 weeks (chronic). Sinusitis often develops after a cold. What are the causes? This condition is caused by anything that creates swelling in the sinuses or stops mucus from draining, including:  Allergies.  Asthma.  Bacterial or viral infection.  Abnormally shaped bones between the nasal passages.  Nasal growths that contain mucus (nasal polyps).  Narrow sinus openings.  Pollutants, such as chemicals or  irritants in the air.  A foreign object stuck in the nose.  A fungal infection. This is rare.  What increases the risk? The following factors may make you more likely to develop this condition:  Having allergies or asthma.  Having had a recent cold or respiratory tract infection.  Having structural deformities or blockages in your nose or sinuses.  Having a weak immune system.  Doing a lot of swimming or diving.  Overusing nasal sprays.  Smoking.  What are the signs or symptoms? The main symptoms of this condition are pain and a feeling of pressure around the affected sinuses. Other symptoms include:  Upper toothache.  Earache.  Headache.  Bad breath.  Decreased sense of smell and taste.  A cough that may get worse at night.  Fatigue.  Fever.  Thick drainage from your nose. The drainage is often green and it may contain pus (purulent).  Stuffy nose or congestion.  Postnasal drip. This is when extra mucus collects in the throat or back of the nose.  Swelling and warmth over the affected sinuses.  Sore throat.  Sensitivity to light.  How is this diagnosed? This condition is diagnosed based on symptoms, a medical history, and a physical exam. To find out if your condition is acute or chronic, your health care provider may:  Look in your nose for signs of nasal polyps.  Tap over the affected sinus to check for signs of infection.  View the inside of your sinuses using an imaging device that has a light attached (endoscope).  If your health care provider suspects that you have chronic sinusitis, you may also:  Be tested for allergies.  Have a sample of mucus taken from your  nose (nasal culture) and checked for bacteria.  Have a mucus sample examined to see if your sinusitis is related to an allergy.  If your sinusitis does not respond to treatment and it lasts longer than 8 weeks, you may have an MRI or CT scan to check your sinuses. These scans also  help to determine how severe your infection is. In rare cases, a bone biopsy may be done to rule out more serious types of fungal sinus disease. How is this treated? Treatment for sinusitis depends on the cause and whether your condition is chronic or acute. If a virus is causing your sinusitis, your symptoms will go away on their own within 10 days. You may be given medicines to relieve your symptoms, including:  Topical nasal decongestants. They shrink swollen nasal passages and let mucus drain from your sinuses.  Antihistamines. These drugs block inflammation that is triggered by allergies. This can help to ease swelling in your nose and sinuses.  Topical nasal corticosteroids. These are nasal sprays that ease inflammation and swelling in your nose and sinuses.  Nasal saline washes. These rinses can help to get rid of thick mucus in your nose.  If your condition is caused by bacteria, you will be given an antibiotic medicine. If your condition is caused by a fungus, you will be given an antifungal medicine. Surgery may be needed to correct underlying conditions, such as narrow nasal passages. Surgery may also be needed to remove polyps. Follow these instructions at home: Medicines  Take, use, or apply over-the-counter and prescription medicines only as told by your health care provider. These may include nasal sprays.  If you were prescribed an antibiotic medicine, take it as told by your health care provider. Do not stop taking the antibiotic even if you start to feel better. Hydrate and Humidify  Drink enough water to keep your urine clear or pale yellow. Staying hydrated will help to thin your mucus.  Use a cool mist humidifier to keep the humidity level in your home above 50%.  Inhale steam for 10-15 minutes, 3-4 times a day or as told by your health care provider. You can do this in the bathroom while a hot shower is running.  Limit your exposure to cool or dry air. Rest  Rest  as much as possible.  Sleep with your head raised (elevated).  Make sure to get enough sleep each night. General instructions  Apply a warm, moist washcloth to your face 3-4 times a day or as told by your health care provider. This will help with discomfort.  Wash your hands often with soap and water to reduce your exposure to viruses and other germs. If soap and water are not available, use hand sanitizer.  Do not smoke. Avoid being around people who are smoking (secondhand smoke).  Keep all follow-up visits as told by your health care provider. This is important. Contact a health care provider if:  You have a fever.  Your symptoms get worse.  Your symptoms do not improve within 10 days. Get help right away if:  You have a severe headache.  You have persistent vomiting.  You have pain or swelling around your face or eyes.  You have vision problems.  You develop confusion.  Your neck is stiff.  You have trouble breathing. This information is not intended to replace advice given to you by your health care provider. Make sure you discuss any questions you have with your health care provider. Document Released:  10/06/2005 Document Revised: 06/01/2016 Document Reviewed: 08/01/2015 Elsevier Interactive Patient Education  2018 [place]. Sophiya Morello M.D.

## 2018-02-23 ENCOUNTER — Encounter (INDEPENDENT_AMBULATORY_CARE_PROVIDER_SITE_OTHER): Payer: Self-pay | Admitting: Orthopedic Surgery

## 2018-02-23 ENCOUNTER — Ambulatory Visit (INDEPENDENT_AMBULATORY_CARE_PROVIDER_SITE_OTHER): Payer: Federal, State, Local not specified - PPO | Admitting: Orthopedic Surgery

## 2018-02-23 DIAGNOSIS — M25872 Other specified joint disorders, left ankle and foot: Secondary | ICD-10-CM

## 2018-02-23 NOTE — Progress Notes (Signed)
Office Visit Note   Patient: Glenda Mendez           Date of Birth: 1991-05-10           MRN: [id number] Visit Date: 02/23/2018              Requested by: Burnis Medin, MD [address] [place], [place] [address] PCP: Burnis Medin, MD  Chief Complaint  Patient presents with  . Left Ankle - Follow-up      HPI: Patient is a 27 year old woman who presents status post left ankle arthroscopy and debridement.  Patient states she still has some swelling and feels stiff she states she is working on Achilles stretching she states that her ankle did give out on the once and she fell.  Patient feels like she is making progress.  Assessment & Plan: Visit Diagnoses:  1. Impingement of left ankle joint     Plan: We will continue with the Achilles stretching continue with range of motion and increasing her activities.  Patient feels like she can return to work in several weeks will allow her to return to work in 2 weeks 3 hours/day on her feet for 4 weeks with no weekends for 4 weeks.  Follow-up in 4 weeks at which time anticipate she can be released.  Follow-Up Instructions: Return in about 1 month (around 03/23/2018).   Ortho Exam  Patient is alert, oriented, no adenopathy, well-dressed, normal affect, normal respiratory effort. Examination patient has good range of motion of her ankle which is pain-free.  There is no redness no cellulitis she does have a little bit of swelling.  Imaging: No results found. No images are attached to the encounter.  Labs: Lab Results  Component Value Date   HGBA1C 6.6 (H) 03/23/2017   HGBA1C 6.1 06/26/2016   HGBA1C 6.1 07/03/2015   ESRSEDRATE 17 07/03/2015   REPTSTATUS 03/14/2014 FINAL 03/12/2014   CULT  03/12/2014    No Beta Hemolytic Streptococci Isolated Performed at [facility]    Lab Results  Component Value Date/Time   HGBA1C 6.6 (H) 03/23/2017 09:44 AM   HGBA1C 6.1 06/26/2016 10:17 AM   HGBA1C 6.1 07/03/2015  10:18 AM    There is no height or weight on file to calculate BMI.  Orders:  No orders of the defined types were placed in this encounter.  No orders of the defined types were placed in this encounter.    Procedures: No procedures performed  Clinical Data: No additional findings.  ROS:  All other systems negative, except as noted in the HPI. Review of Systems  Objective: Vital Signs: There were no vitals taken for this visit.  Specialty Comments:  No specialty comments available.  PMFS History: Patient Active Problem List   Diagnosis Date Noted  . Impingement of left ankle joint 12/02/2017  . Pelvic pain 11/16/2017  . Left ankle pain 06/23/2017  . Ganglion cyst of flexor tendon sheath of finger of right hand 02/02/2017  . Trigger finger, right middle finger 02/02/2017  . Eczema 07/11/2016  . Pruritus 06/26/2016  . Dyspnea and respiratory abnormality 05/08/2016  . Wheezing 05/08/2016  . Elevated testosterone level in female 08/28/2015  . Elevated hemoglobin A1c 08/28/2015  . Abdominal pain, right upper quadrant 06/05/2015  . Abnormal urine 06/05/2015  . Abnormal alkaline phosphatase test 04/27/2015  . Severe obesity (BMI >= 40) ([place]) 12/19/2014  . Asthma with acute exacerbation 08/29/2014  . Moderate intermittent asthma, uncomplicated 62/12/5595  . Rash,  skin 06/20/2014  . Moderate persistent asthma with acute exacerbation in adult 05/27/2014  . Anterior chest wall pain 05/27/2014  . Low grade squamous intraepithelial lesion (LGSIL) on cervical Pap smear 12/02/2013  . Obesity (BMI 30-39.9) 11/28/2013  . Visit for preventive health examination 11/28/2013  . Routine gynecological examination 11/28/2013  . Weight gain 11/28/2013  . Asthma 11/28/2013  . Moderate persistent asthma without complication 54/49/2010  . Hemiparesis ([place])   . Oral contraceptive pill surveillance 04/25/2012  . Headache(784.0) 06/04/2011  . Irregular periods 06/04/2011  . Acne  06/04/2011  . BCP (birth control pills) initiation 06/04/2011  . Preventative health care 02/18/2011  . Neoplasm of uncertain behavior of skin 02/18/2011  . BMI (body mass index), pediatric, 95-99% for age 31/10/2010  . BACK PAIN 09/17/2010  . NECK PAIN 01/02/2009  . FATIGUE 12/06/2008  . Attention deficit hyperactivity disorder (ADHD) 09/03/2007  . UNSPECIFIED HEARING LOSS 09/03/2007  . ALLERGIC RHINITIS 05/19/2007  . ASTHMA 05/19/2007   Past Medical History:  Diagnosis Date  . ADHD (attention deficit hyperactivity disorder)   . Allergy   . Asthma   . BMI (body mass index), pediatric, 95-99% for age 94/10/2010   Is starting to gain weight again and advised and counseled today about reduction for health risk reasons. Patient is very well aware of how to do lifestyle intervention she has done this before.   Marland Kitchen Hearing loss in right ear    with hearing aid bilateral   . Hemiparesis ([place])    rt from neonatal period  . Hypoxia of newborn   . Irregular periods 06/04/2011  . PCOS (polycystic ovarian syndrome)   . Pulmonary hemorrhage of fetus or newborn    31 week C-section [facility] of with birth hypoxia    Family History  Adopted: Yes  Problem Relation Age of Onset  . Hypertension Unknown   . Schizophrenia Mother        with alcohol drug abuse  . Diabetes Other     Past Surgical History:  Procedure Laterality Date  . TONSILLECTOMY     Social History   Occupational History  . Not on file  Tobacco Use  . Smoking status: Never Smoker  . Smokeless tobacco: Never Used  Substance and Sexual Activity  . Alcohol use: No    Alcohol/week: 0.0 oz  . Drug use: No  . Sexual activity: Never    Birth control/protection: Pill

## 2018-03-03 ENCOUNTER — Other Ambulatory Visit: Payer: Self-pay | Admitting: Internal Medicine

## 2018-03-03 ENCOUNTER — Other Ambulatory Visit: Payer: Self-pay | Admitting: Pulmonary Disease

## 2018-03-03 ENCOUNTER — Other Ambulatory Visit: Payer: Self-pay | Admitting: Adult Health

## 2018-03-03 MED ORDER — IPRATROPIUM BROMIDE 0.02 % IN SOLN: RESPIRATORY_TRACT | 0 refills | Status: DC

## 2018-03-03 MED ORDER — METHYLPHENIDATE HCL ER (OSM) 36 MG PO TBCR: EXTENDED_RELEASE_TABLET | ORAL | 0 refills | Status: DC

## 2018-03-03 NOTE — Telephone Encounter (Signed)
mychart message sent back to patient making aware.  Aware also that due for CPE in June 2019 Nothing further needed.

## 2018-03-03 NOTE — Telephone Encounter (Addendum)
Last CPE 03/2017 , pt is due next month for annual visit.  Last filled 03/30/2017, #60 Please advise Dr Regis Bill, thanks. Marland Kitchen

## 2018-03-22 ENCOUNTER — Ambulatory Visit (INDEPENDENT_AMBULATORY_CARE_PROVIDER_SITE_OTHER): Payer: Federal, State, Local not specified - PPO | Admitting: Orthopedic Surgery

## 2018-03-22 ENCOUNTER — Encounter (INDEPENDENT_AMBULATORY_CARE_PROVIDER_SITE_OTHER): Payer: Self-pay | Admitting: Orthopedic Surgery

## 2018-03-22 DIAGNOSIS — M25872 Other specified joint disorders, left ankle and foot: Secondary | ICD-10-CM

## 2018-03-22 NOTE — Progress Notes (Signed)
Office Visit Note   Patient: Glenda Mendez           Date of Birth: September 30, 1991           MRN: [id number] Visit Date: 03/22/2018              Requested by: Burnis Medin, MD [place], [place] [address] PCP: Burnis Medin, MD  No chief complaint on file.     HPI: Patient is a 27 year old woman who is status post left ankle arthroscopy.  She has gone back to restricted work hours and she states that this is working well for her.  She states that she has increased pain with prolonged standing and walking and is currently using ibuprofen.  Assessment & Plan: Visit Diagnoses:  1. Impingement of left ankle joint     Plan: Recommended continue to minimize her weightbearing activities she was given a note that she may work 3 days a week 3 to 4 hours a day no we can work.  Follow-Up Instructions: Return in about 1 month (around 04/19/2018).   Ortho Exam  Patient is alert, oriented, no adenopathy, well-dressed, normal affect, normal respiratory effort. Examination patient has good range of motion of the ankle there is minimal swelling at this time there is no redness no cellulitis patient states that wearing compression stockings in the past made this worse.  Discussed that she may need to pursue lighter duty work and standing on her feet.  Imaging: No results found. No images are attached to the encounter.  Labs: Lab Results  Component Value Date   HGBA1C 6.6 (H) 03/23/2017   HGBA1C 6.1 06/26/2016   HGBA1C 6.1 07/03/2015   ESRSEDRATE 17 07/03/2015   REPTSTATUS 03/14/2014 FINAL 03/12/2014   CULT  03/12/2014    No Beta Hemolytic Streptococci Isolated Performed at [facility]     Lab Results  Component Value Date   ALBUMIN 4.3 03/23/2017   ALBUMIN 3.7 05/31/2015   ALBUMIN 3.6 05/20/2015    There is no height or weight on file to calculate BMI.  Orders:  No orders of the defined types were placed in this encounter.  No orders of the  defined types were placed in this encounter.    Procedures: No procedures performed  Clinical Data: No additional findings.  ROS:  All other systems negative, except as noted in the HPI. Review of Systems  Objective: Vital Signs: There were no vitals taken for this visit.  Specialty Comments:  No specialty comments available.  PMFS History: Patient Active Problem List   Diagnosis Date Noted  . Impingement of left ankle joint 12/02/2017  . Pelvic pain 11/16/2017  . Left ankle pain 06/23/2017  . Ganglion cyst of flexor tendon sheath of finger of right hand 02/02/2017  . Trigger finger, right middle finger 02/02/2017  . Eczema 07/11/2016  . Pruritus 06/26/2016  . Dyspnea and respiratory abnormality 05/08/2016  . Wheezing 05/08/2016  . Elevated testosterone level in female 08/28/2015  . Elevated hemoglobin A1c 08/28/2015  . Abdominal pain, right upper quadrant 06/05/2015  . Abnormal urine 06/05/2015  . Abnormal alkaline phosphatase test 04/27/2015  . Severe obesity (BMI >= 40) ([place]) 12/19/2014  . Asthma with acute exacerbation 08/29/2014  . Moderate intermittent asthma, uncomplicated 14/43/1540  . Rash, skin 06/20/2014  . Moderate persistent asthma with acute exacerbation in adult 05/27/2014  . Anterior chest wall pain 05/27/2014  . Low grade squamous intraepithelial lesion (LGSIL) on cervical Pap smear  12/02/2013  . Obesity (BMI 30-39.9) 11/28/2013  . Visit for preventive health examination 11/28/2013  . Routine gynecological examination 11/28/2013  . Weight gain 11/28/2013  . Asthma 11/28/2013  . Moderate persistent asthma without complication 80/99/8338  . Hemiparesis ([place])   . Oral contraceptive pill surveillance 04/25/2012  . Headache(784.0) 06/04/2011  . Irregular periods 06/04/2011  . Acne 06/04/2011  . BCP (birth control pills) initiation 06/04/2011  . Preventative health care 02/18/2011  . Neoplasm of uncertain behavior of skin 02/18/2011  . BMI (body  mass index), pediatric, 95-99% for age 65/10/2010  . BACK PAIN 09/17/2010  . NECK PAIN 01/02/2009  . FATIGUE 12/06/2008  . Attention deficit hyperactivity disorder (ADHD) 09/03/2007  . UNSPECIFIED HEARING LOSS 09/03/2007  . ALLERGIC RHINITIS 05/19/2007  . ASTHMA 05/19/2007   Past Medical History:  Diagnosis Date  . ADHD (attention deficit hyperactivity disorder)   . Allergy   . Asthma   . BMI (body mass index), pediatric, 95-99% for age 12/19/2010   Is starting to gain weight again and advised and counseled today about reduction for health risk reasons. Patient is very well aware of how to do lifestyle intervention she has done this before.   Marland Kitchen Hearing loss in right ear    with hearing aid bilateral   . Hemiparesis ([place])    rt from neonatal period  . Hypoxia of newborn   . Irregular periods 06/04/2011  . PCOS (polycystic ovarian syndrome)   . Pulmonary hemorrhage of fetus or newborn    49 week C-section [facility] of with birth hypoxia    Family History  Adopted: Yes  Problem Relation Age of Onset  . Hypertension Unknown   . Schizophrenia Mother        with alcohol drug abuse  . Diabetes Other     Past Surgical History:  Procedure Laterality Date  . TONSILLECTOMY     Social History   Occupational History  . Not on file  Tobacco Use  . Smoking status: Never Smoker  . Smokeless tobacco: Never Used  Substance and Sexual Activity  . Alcohol use: No    Alcohol/week: 0.0 oz  . Drug use: No  . Sexual activity: Never    Birth control/protection: Pill

## 2018-03-23 ENCOUNTER — Telehealth: Payer: Self-pay | Admitting: Internal Medicine

## 2018-03-23 NOTE — Telephone Encounter (Signed)
Copied from [place] [id number]. Topic: Quick Communication - Rx Refill/Question >> Mar 23, 2018  1:47 PM Lennox Solders wrote: Medication: methylpheridate 36 mg Has the patient contacted their pharmacy? No (Agent: If yes, when and what did the pharmacy advise?)  Preferred Pharmacy (with phone number or street name): [facility] Agent: Please be advised that RX refills may take up to 3 business days. We ask that you follow-up with your pharmacy.

## 2018-03-24 NOTE — Telephone Encounter (Signed)
Detailed message left for patient  Nothing further needed.

## 2018-03-24 NOTE — Telephone Encounter (Signed)
Pt returned call - please call [phone]

## 2018-03-24 NOTE — Telephone Encounter (Addendum)
Rx was just filled on 03/03/18 #60 (30 day supply) -- not due until 04/02/18 Pt made aware that Rx request too soon and to call back next week for this refill.  Nothing further needed.

## 2018-03-25 NOTE — Telephone Encounter (Addendum)
Pt states that she ran out of her medication Monday 03/22/18 This was prescribed on 03/03/18 #60 Pt states that she did not count the pills in the bottle when she picked up her Rx she just started taking them and realized that she ran out too quick for there to really be #60. Pt states that the bottles states #60.   Advised that I would contact the pharmacy.   Called the pharmacy-  Pharmacy states that the patient has not filled a Rx for this medication since January 2018 - not sure where the patient has been getting her Rx. Rx on 03/03/18 #60 required PA and was not filled. Our records show that a Rx was sent in 03/2017 but this was never filled at [facility] either.   Called patient-  Pt states that she gets all of her Rx from [facility] so not sure why its saying that she has not filled since 2018 I advised that we do not have any record of an Rx being written from 03/2017 to 03/03/18. Pt states that she has been taking her meds as directed and has been getting Rxs but is not sure why it is not showing up in the system. I asked if she has been getting it from another provider and he stated that she only get is from Dr Regis Bill. Pt then starting stating that she may not take her meds as she is supposed to some days and may miss days then might double up. I advised that this has gotten too confusing and The conversation began not making much sense. Advised that we need to get her in to be seen with Dr Regis Bill to discuss her meds and how she is supposed to be taking them so that she can better manage her Rxs.  Pt is scheduled with Dr Regis Bill 04/02/18 at 57 for CPE and meds discussion.   Will send to Dr Regis Bill to advise if a short term rx can be filled to her by until her appt 04/02/18.   Pharmacy is faxing over a PA to our office for the Concerta. Will let Arville Go know this is coming so that she can keep an eye out.

## 2018-03-29 ENCOUNTER — Telehealth: Payer: Self-pay | Admitting: *Deleted

## 2018-03-29 MED ORDER — METHYLPHENIDATE HCL ER (OSM) 36 MG PO TBCR: EXTENDED_RELEASE_TABLET | ORAL | 0 refills | Status: DC

## 2018-03-29 NOTE — Telephone Encounter (Signed)
Prior auth for Methylphenidate 36mg  sent to [email or web address] PA Case ID: [id number]  .  I called [facility] and informed Sam of this.

## 2018-03-29 NOTE — Addendum Note (Signed)
Addended byShanon Ace K on: 03/29/2018 01:23 PM   Modules accepted: Orders

## 2018-03-29 NOTE — Telephone Encounter (Signed)
Pt notified of results/instructions and verbalized understanding. Instructed patient to keep appt as scheduled for Friday and she verbalized understanding.

## 2018-03-29 NOTE — Telephone Encounter (Signed)
2 tabs per day is one month  Will refill x 1

## 2018-03-31 NOTE — Progress Notes (Signed)
Chief Complaint  Patient presents with  . Annual Exam  . Rash    patient complains of itchy rash all over the body x5 days    HPI: Patient  Glenda Mendez  27 y.o. comes in today for Preventive Health Care visit  And multiple problems    Asthma stable   adhd helps    Working o n medication  ocass forgets   Obesity   just started  Working on [employer] with app  pcos  On ocps for a couple years .  Rash and itching is problematic and itching keeping her from sleeping and using otc  hcs and  Family memboer eucriso ocass  Area arms  Axilla and other   Not taking metformin reg cause causes stomach ache and   Bowel urgency so not adherant     Health Maintenance  Topic Date Due  . HIV Screening  10/05/2007  . PAP SMEAR  12/18/2017  . INFLUENZA VACCINE  05/20/2018  . TETANUS/TDAP  02/26/2027   Health Maintenance Review LIFESTYLE:  Exercise:   Tobacco/ETS:no Alcohol:  no Sugar beverages: Sleep:  9 pm  Hard sleep  small child in town  And eczema  Bad   And helps   eucrisa .   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     ROS:  GEN/ HEENT: No fever, significant weight changes sweats headaches vision problems hearing changes, CV/ PULM; No chest pain shortness of breath cough, syncope,edema  change in exercise tolerance. GI /GU: No adominal pain, vomiting, change in bowel habits. No blood in the stool. No significant GU symptoms. SKIN/HEME: ,no acute skin rashes suspicious lesions or bleeding. No lymphadenopathy, nodules, masses.  NEURO/ PSYCH:  No neurologic signs such as weakness numbness. No depression anxiety. IMM/ Allergy: No unusual infections.  Allergy .   REST of 12 system review negative except as per HPI   Past Medical History:  Diagnosis Date  . ADHD (attention deficit hyperactivity disorder)   . Allergy   . Asthma   . BMI (body mass index), pediatric, 95-99% for age 21/10/2010   Is starting to gain weight again and advised and  counseled today about reduction for health risk reasons. Patient is very well aware of how to do lifestyle intervention she has done this before.   Marland Kitchen Hearing loss in right ear    with hearing aid bilateral   . Hemiparesis ([place])    rt from neonatal period  . Hypoxia of newborn   . Irregular periods 06/04/2011  . PCOS (polycystic ovarian syndrome)   . Pulmonary hemorrhage of fetus or newborn    52 week C-section [facility] of with birth hypoxia    Past Surgical History:  Procedure Laterality Date  . TONSILLECTOMY      Family History  Adopted: Yes  Problem Relation Age of Onset  . Hypertension Unknown   . Schizophrenia Mother        with alcohol drug abuse  . Diabetes Other     Social History   Socioeconomic History  . Marital status: Single    Spouse name: Not on file  . Number of children: Not on file  . Years of education: Not on file  . Highest education level: Not on file  Occupational History  . Not on file  Social Needs  . Financial resource strain: Not on file  . Food insecurity:  Worry: Not on file    Inability: Not on file  . Transportation needs:    Medical: Not on file    Non-medical: Not on file  Tobacco Use  . Smoking status: Never Smoker  . Smokeless tobacco: Never Used  Substance and Sexual Activity  . Alcohol use: No    Alcohol/week: 0.0 oz  . Drug use: No  . Sexual activity: Never    Birth control/protection: Pill  Lifestyle  . Physical activity:    Days per week: Not on file    Minutes per session: Not on file  . Stress: Not on file  Relationships  . Social connections:    Talks on phone: Not on file    Gets together: Not on file    Attends religious service: Not on file    Active member of club or organization: Not on file    Attends meetings of clubs or organizations: Not on file    Relationship status: Not on file  Other Topics Concern  . Not on file  Social History Narrative   Adopted and raised by family member [name]    [employer] high school  graduate [employer] second semester   Living at home with mom and has no car   Sleep 8+ hours   Seeing Dr. [name]- pulm at [facility] now has a new pulmonary Dr. Now stable and on prn       Thyroid evaluation in the past neck evaluation within normal limits and no thyroid disease.    Outpatient Medications Prior to Visit  Medication Sig Dispense Refill  . albuterol (PROVENTIL HFA;VENTOLIN HFA) 108 (90 BASE) MCG/ACT inhaler Inhale 2 puffs into the lungs every 6 (six) hours as needed for wheezing or shortness of breath. 1 Inhaler 4  . albuterol (PROVENTIL) (2.5 MG/3ML) 0.083% nebulizer solution Take 3 mLs (2.5 mg total) by nebulization every 4 (four) hours as needed for wheezing or shortness of breath. 75 mL 2  . amoxicillin-clavulanate (AUGMENTIN) 875-125 MG tablet Take 1 tablet by mouth every 12 (twelve) hours. For sinusitis 14 tablet 0  . benzonatate (TESSALON PERLES) 100 MG capsule Take 1 capsule (100 mg total) by mouth 3 (three) times daily as needed. 20 capsule 0  . diclofenac (VOLTAREN) 75 MG EC tablet Take 1 tablet (75 mg total) by mouth 2 (two) times daily. Take 1 tab bid X 10 days then as needed 30 tablet 0  . fluticasone (FLONASE) 50 MCG/ACT nasal spray Place 2 sprays into both nostrils daily. 16 g 6  . fluticasone-salmeterol (ADVAIR HFA) 230-21 MCG/ACT inhaler Inhale 2 puffs into the lungs 2 (two) times daily.    . hydrOXYzine (ATARAX/VISTARIL) 10 MG tablet Take 1 tablet (10 mg total) by mouth daily as needed for itching. 30 tablet 0  . ipratropium (ATROVENT) 0.02 % nebulizer solution USE 1 VIAL BY NEBULIZATION 3 (THREE) TIMES DAILY AS NEEDED FOR WHEEZING. **DUE FOR ANNUAL VISIT June 2019** 62.5 mL 0  . levocetirizine (XYZAL ALLERGY 24HR) 5 MG tablet Take 1 tablet (5 mg total) by mouth every evening. 90 tablet 3  . metFORMIN (GLUCOPHAGE-XR) 500 MG 24 hr tablet Take 1 tablet (500 mg total) by mouth daily with breakfast. Or largest meal increae to 1 twice a day  after 2 weeks 60 tablet 5  . methylphenidate 36 MG PO CR tablet TAKE (2) TABLETS DAILY. 60 tablet 0  . Naproxen-Esomeprazole (VIMOVO) 500-20 MG TBEC Take 1 tablet by mouth 2 (two) times daily. 60 tablet 2  .  norgestimate-ethinyl estradiol (ORTHO-CYCLEN, 28,) 0.25-35 MG-MCG tablet Take 1 tablet by mouth daily. 1 Package 5  . omeprazole (PRILOSEC) 20 MG capsule Take 1 capsule (20 mg total) by mouth 2 (two) times daily before a meal. 60 capsule 3  . pantoprazole (PROTONIX) 40 MG tablet Take 1 tablet (40 mg total) by mouth daily. 30 tablet 0   No facility-administered medications prior to visit.      EXAM:  BP 110/80 (BP Location: Left Arm, Patient Position: Sitting, Cuff Size: Large)   Pulse 88   Temp 98.2 F (36.8 C) (Oral)   Ht '5\' 6"'  (1.676 m)   Wt 290 lb (131.5 kg)   LMP 03/26/2018 (Exact Date)   BMI 46.81 kg/m   Body mass index is 46.81 kg/m. Wt Readings from Last 3 Encounters:  04/02/18 290 lb (131.5 kg)  02/10/18 293 lb (132.9 kg)  12/21/17 292 lb (132.5 kg)    Physical Exam: Vital signs reviewed ATF:TDDU is a well-developed well-nourished alert cooperative    who appearsr stated age in no acute distress.  HEENT: normocephalic atraumatic , Eyes: PERRL EOM's full, conjunctiva clear, Nares: paten,t no deformity discharge or tenderness., Ears: no deformity EAC's clear TMs with normal landmarks. Mouth: clear OP, no lesions, edema.  Moist mucous membranes. Dentition in adequate repair. NECK: supple without , thyromegaly or bruits. CHEST/PULM:  Clear to auscultation and percussion breath sounds equal no wheeze , rales or rhonchi. No chest wall deformities or tenderness. Breast: normal by inspection . No dimpling, discharge, masses, tenderness or discharge . CV: PMI is nondisplaced, S1 S2 no gallops, murmurs, rubs. Peripheral pulses are full without delay.No JVD .  ABDOMEN: Bowel sounds normal nontender  No guard or rebound, no hepato splenomegal no CVA tenderness.  No  hernia. Extremtities:  No clubbing cyanosis or edema, no acute joint swelling or redness no focal atrophy NEURO:  Oriented x3, cranial nerves 3-12 appear to be intact, minimal hemipareis  not tested ss,gait within normal limit no abnormal reflexes or asymmetrical SKIN extensive lichenified  Rash on antecubital area  And  Excoriated ted  Upper neck chest   And  Axillary rash   color, no bruising or petechiae. PSYCH: Oriented, good eye contact, no obvious depression anxiety, cognition and judgment appear normal. LN: no cervical axillary inguinal adenopathy Pelvic: NL ext GU, labia clear without lesions or rash . Vagina no lesions .Cervix: clear  UTERUS: Neg CMT Adnexa:  clear no masses . PAP done exam limited by large abdominal wall. sti screen   Lab Results  Component Value Date   WBC 5.6 04/02/2018   HGB 11.7 (L) 04/02/2018   HCT 37.3 04/02/2018   PLT 382.0 04/02/2018   GLUCOSE 87 04/02/2018   CHOL 149 04/02/2018   TRIG 60.0 04/02/2018   HDL 39.10 04/02/2018   LDLCALC 98 04/02/2018   ALT 25 04/02/2018   AST 22 04/02/2018   NA 141 04/02/2018   K 4.4 04/02/2018   CL 104 04/02/2018   CREATININE 0.90 04/02/2018   BUN 10 04/02/2018   CO2 28 04/02/2018   TSH 1.82 04/02/2018   HGBA1C 6.3 04/02/2018    BP Readings from Last 3 Encounters:  04/02/18 110/80  02/10/18 120/78  11/16/17 110/80   t   ASSESSMENT AND PLAN:  Discussed the following assessment and plan:  Visit for preventive health examination - Plan: Basic metabolic panel, CBC with Differential/Platelet, Hemoglobin A1c, Hepatic function panel, Lipid panel, TSH, RPR, HIV antibody  Severe obesity (BMI >= 40) (HCC) -  Plan: Basic metabolic panel, CBC with Differential/Platelet, Hemoglobin A1c, Hepatic function panel, Lipid panel, TSH, RPR, HIV antibody  Medication management - Plan: Basic metabolic panel, CBC with Differential/Platelet, Hemoglobin A1c, Hepatic function panel, Lipid panel, TSH, RPR, HIV antibody  Routine  screening for STI (sexually transmitted infection) - Plan: Basic metabolic panel, CBC with Differential/Platelet, Hemoglobin A1c, Hepatic function panel, Lipid panel, TSH, RPR, HIV antibody, PAP [[place]]  Oral contraceptive use - Plan: Basic metabolic panel, CBC with Differential/Platelet, Hemoglobin A1c, Hepatic function panel, Lipid panel, TSH, RPR, HIV antibody  Elevated hemoglobin A1c - Plan: Basic metabolic panel, CBC with Differential/Platelet, Hemoglobin A1c, Hepatic function panel, Lipid panel, TSH, RPR, HIV antibody  Eczema, unspecified type - Plan: Ambulatory referral to Dermatology  Cervical cancer screening - Plan: PAP [[place]]  Pruritus - Plan: Ambulatory referral to Dermatology  Oral contraceptive pill surveillance  Encounter for gynecological examination without abnormal finding  Rash - Plan: Ambulatory referral to Dermatology  BCP (birth control pills) initiation   Skin is problematic  And causing sleep disturbance     Advise derm referral   rx for eucrisa for arms.  Lab tocay and plan for fu in a month about  Obesity labs etc  Patient Care Team: Panosh, Standley Brooking, MD as PCP - General Everitt Amber, MD (Ophthalmology) Rosealee Albee, MD as Referring Physician (Internal Medicine) Philemon Kingdom, MD as Consulting Physician (Internal Medicine) Patient Instructions  checkking lab today   We mayneed to change the metformin since you are having side effects.  I want you to see a dermatologist about the   Eczema   Topical   Moisturize   Sen in [place] but  May need other rx  And is expensive .    Weight loss  Important and getting the itching under control  May help    Plan fu in 3-4 weeks  About the  Medication etc .   Health Maintenance, Female Adopting a healthy lifestyle and getting preventive care can go a long way to promote health and wellness. Talk with your health care provider about what schedule of regular examinations is right for you. This is  a good chance for you to check in with your provider about disease prevention and staying healthy. In between checkups, there are plenty of things you can do on your own. Experts have done a lot of research about which lifestyle changes and preventive measures are most likely to keep you healthy. Ask your health care provider for more information. Weight and diet Eat a healthy diet  Be sure to include plenty of vegetables, fruits, low-fat dairy products, and lean protein.  Do not eat a lot of foods high in solid fats, added sugars, or salt.  Get regular exercise. This is one of the most important things you can do for your health. ? Most adults should exercise for at least 150 minutes each week. The exercise should increase your heart rate and make you sweat (moderate-intensity exercise). ? Most adults should also do strengthening exercises at least twice a week. This is in addition to the moderate-intensity exercise.  Maintain a healthy weight  Body mass index (BMI) is a measurement that can be used to identify possible weight problems. It estimates body fat based on height and weight. Your health care provider can help determine your BMI and help you achieve or maintain a healthy weight.  For females 80 years of age and older: ? A BMI below 18.5 is considered underweight. ? A  BMI of 18.5 to 24.9 is normal. ? A BMI of 25 to 29.9 is considered overweight. ? A BMI of 30 and above is considered obese.  Watch levels of cholesterol and blood lipids  You should start having your blood tested for lipids and cholesterol at 25 years of age, then have this test every 5 years.  You may need to have your cholesterol levels checked more often if: ? Your lipid or cholesterol levels are high. ? You are older than 26 years of age. ? You are at high risk for heart disease.  Cancer screening Lung Cancer  Lung cancer screening is recommended for adults 21-36 years old who are at high risk for lung  cancer because of a history of smoking.  A yearly low-dose CT scan of the lungs is recommended for people who: ? Currently smoke. ? Have quit within the past 15 years. ? Have at least a 30-pack-year history of smoking. A pack year is smoking an average of one pack of cigarettes a day for 1 year.  Yearly screening should continue until it has been 15 years since you quit.  Yearly screening should stop if you develop a health problem that would prevent you from having lung cancer treatment.  Breast Cancer  Practice breast self-awareness. This means understanding how your breasts normally appear and feel.  It also means doing regular breast self-exams. Let your health care provider know about any changes, no matter how small.  If you are in your 20s or 30s, you should have a clinical breast exam (CBE) by a health care provider every 1-3 years as part of a regular health exam.  If you are 87 or older, have a CBE every year. Also consider having a breast X-ray (mammogram) every year.  If you have a family history of breast cancer, talk to your health care provider about genetic screening.  If you are at high risk for breast cancer, talk to your health care provider about having an MRI and a mammogram every year.  Breast cancer gene (BRCA) assessment is recommended for women who have family members with BRCA-related cancers. BRCA-related cancers include: ? Breast. ? Ovarian. ? Tubal. ? Peritoneal cancers.  Results of the assessment will determine the need for genetic counseling and BRCA1 and BRCA2 testing.  Cervical Cancer Your health care provider may recommend that you be screened regularly for cancer of the pelvic organs (ovaries, uterus, and vagina). This screening involves a pelvic examination, including checking for microscopic changes to the surface of your cervix (Pap test). You may be encouraged to have this screening done every 3 years, beginning at age 83.  For women ages  5-65, health care providers may recommend pelvic exams and Pap testing every 3 years, or they may recommend the Pap and pelvic exam, combined with testing for human papilloma virus (HPV), every 5 years. Some types of HPV increase your risk of cervical cancer. Testing for HPV may also be done on women of any age with unclear Pap test results.  Other health care providers may not recommend any screening for nonpregnant women who are considered low risk for pelvic cancer and who do not have symptoms. Ask your health care provider if a screening pelvic exam is right for you.  If you have had past treatment for cervical cancer or a condition that could lead to cancer, you need Pap tests and screening for cancer for at least 20 years after your treatment. If Pap tests have been  discontinued, your risk factors (such as having a new sexual partner) need to be reassessed to determine if screening should resume. Some women have medical problems that increase the chance of getting cervical cancer. In these cases, your health care provider may recommend more frequent screening and Pap tests.  Colorectal Cancer  This type of cancer can be detected and often prevented.  Routine colorectal cancer screening usually begins at 26 years of age and continues through 26 years of age.  Your health care provider may recommend screening at an earlier age if you have risk factors for colon cancer.  Your health care provider may also recommend using home test kits to check for hidden blood in the stool.  A small camera at the end of a tube can be used to examine your colon directly (sigmoidoscopy or colonoscopy). This is done to check for the earliest forms of colorectal cancer.  Routine screening usually begins at age 52.  Direct examination of the colon should be repeated every 5-10 years through 26 years of age. However, you may need to be screened more often if early forms of precancerous polyps or small growths are  found.  Skin Cancer  Check your skin from head to toe regularly.  Tell your health care provider about any new moles or changes in moles, especially if there is a change in a mole's shape or color.  Also tell your health care provider if you have a mole that is larger than the size of a pencil eraser.  Always use sunscreen. Apply sunscreen liberally and repeatedly throughout the day.  Protect yourself by wearing long sleeves, pants, a wide-brimmed hat, and sunglasses whenever you are outside.  Heart disease, diabetes, and high blood pressure  High blood pressure causes heart disease and increases the risk of stroke. High blood pressure is more likely to develop in: ? People who have blood pressure in the high end of the normal range (130-139/85-89 mm Hg). ? People who are overweight or obese. ? People who are African American.  If you are 65-34 years of age, have your blood pressure checked every 3-5 years. If you are 41 years of age or older, have your blood pressure checked every year. You should have your blood pressure measured twice-once when you are at a hospital or clinic, and once when you are not at a hospital or clinic. Record the average of the two measurements. To check your blood pressure when you are not at a hospital or clinic, you can use: ? An automated blood pressure machine at a pharmacy. ? A home blood pressure monitor.  If you are between 33 years and 40 years old, ask your health care provider if you should take aspirin to prevent strokes.  Have regular diabetes screenings. This involves taking a blood sample to check your fasting blood sugar level. ? If you are at a normal weight and have a low risk for diabetes, have this test once every three years after 26 years of age. ? If you are overweight and have a high risk for diabetes, consider being tested at a younger age or more often. Preventing infection Hepatitis B  If you have a higher risk for hepatitis B,  you should be screened for this virus. You are considered at high risk for hepatitis B if: ? You were born in a country where hepatitis B is common. Ask your health care provider which countries are considered high risk. ? Your parents were born in  a high-risk country, and you have not been immunized against hepatitis B (hepatitis B vaccine). ? You have HIV or AIDS. ? You use needles to inject street drugs. ? You live with someone who has hepatitis B. ? You have had sex with someone who has hepatitis B. ? You get hemodialysis treatment. ? You take certain medicines for conditions, including cancer, organ transplantation, and autoimmune conditions.  Hepatitis C  Blood testing is recommended for: ? Everyone born from [birth year] through 1964. ? Anyone with known risk factors for hepatitis C.  Sexually transmitted infections (STIs)  You should be screened for sexually transmitted infections (STIs) including gonorrhea and chlamydia if: ? You are sexually active and are younger than 26 years of age. ? You are older than 26 years of age and your health care provider tells you that you are at risk for this type of infection. ? Your sexual activity has changed since you were last screened and you are at an increased risk for chlamydia or gonorrhea. Ask your health care provider if you are at risk.  If you do not have HIV, but are at risk, it may be recommended that you take a prescription medicine daily to prevent HIV infection. This is called pre-exposure prophylaxis (PrEP). You are considered at risk if: ? You are sexually active and do not regularly use condoms or know the HIV status of your partner(s). ? You take drugs by injection. ? You are sexually active with a partner who has HIV.  Talk with your health care provider about whether you are at high risk of being infected with HIV. If you choose to begin PrEP, you should first be tested for HIV. You should then be tested every 3 months for as long  as you are taking PrEP. Pregnancy  If you are premenopausal and you may become pregnant, ask your health care provider about preconception counseling.  If you may become pregnant, take 400 to 800 micrograms (mcg) of folic acid every day.  If you want to prevent pregnancy, talk to your health care provider about birth control (contraception). Osteoporosis and menopause  Osteoporosis is a disease in which the bones lose minerals and strength with aging. This can result in serious bone fractures. Your risk for osteoporosis can be identified using a bone density scan.  If you are 43 years of age or older, or if you are at risk for osteoporosis and fractures, ask your health care provider if you should be screened.  Ask your health care provider whether you should take a calcium or vitamin D supplement to lower your risk for osteoporosis.  Menopause may have certain physical symptoms and risks.  Hormone replacement therapy may reduce some of these symptoms and risks. Talk to your health care provider about whether hormone replacement therapy is right for you. Follow these instructions at home:  Schedule regular health, dental, and eye exams.  Stay current with your immunizations.  Do not use any tobacco products including cigarettes, chewing tobacco, or electronic cigarettes.  If you are pregnant, do not drink alcohol.  If you are breastfeeding, limit how much and how often you drink alcohol.  Limit alcohol intake to no more than 1 drink per day for nonpregnant women. One drink equals 12 ounces of beer, 5 ounces of wine, or 1 ounces of hard liquor.  Do not use street drugs.  Do not share needles.  Ask your health care provider for help if you need support or information about quitting  drugs.  Tell your health care provider if you often feel depressed.  Tell your health care provider if you have ever been abused or do not feel safe at home. This information is not intended to  replace advice given to you by your health care provider. Make sure you discuss any questions you have with your health care provider. Document Released: 04/21/2011 Document Revised: 03/13/2016 Document Reviewed: 07/10/2015 Elsevier Interactive Patient Education  2018 [place]. Panosh M.D.

## 2018-04-02 ENCOUNTER — Encounter: Payer: Self-pay | Admitting: Internal Medicine

## 2018-04-02 ENCOUNTER — Ambulatory Visit (INDEPENDENT_AMBULATORY_CARE_PROVIDER_SITE_OTHER): Payer: Federal, State, Local not specified - PPO | Admitting: Internal Medicine

## 2018-04-02 ENCOUNTER — Other Ambulatory Visit (HOSPITAL_COMMUNITY)
Admission: RE | Admit: 2018-04-02 | Discharge: 2018-04-02 | Disposition: A | Discharge status: Home / Self Care | Payer: Federal, State, Local not specified - PPO | Source: Ambulatory Visit | Attending: Internal Medicine | Admitting: Internal Medicine

## 2018-04-02 VITALS — BP 110/80 | HR 88 | Temp 98.2°F | Ht 66.0 in | Wt 290.0 lb

## 2018-04-02 DIAGNOSIS — Z113 Encounter for screening for infections with a predominantly sexual mode of transmission: Secondary | ICD-10-CM | POA: Diagnosis not present

## 2018-04-02 DIAGNOSIS — Z0000 Encounter for general adult medical examination without abnormal findings: Secondary | ICD-10-CM | POA: Diagnosis not present

## 2018-04-02 DIAGNOSIS — R21 Rash and other nonspecific skin eruption: Secondary | ICD-10-CM | POA: Diagnosis not present

## 2018-04-02 DIAGNOSIS — Z79899 Other long term (current) drug therapy: Secondary | ICD-10-CM | POA: Diagnosis not present

## 2018-04-02 DIAGNOSIS — Z3041 Encounter for surveillance of contraceptive pills: Secondary | ICD-10-CM | POA: Diagnosis not present

## 2018-04-02 DIAGNOSIS — Z124 Encounter for screening for malignant neoplasm of cervix: Secondary | ICD-10-CM | POA: Insufficient documentation

## 2018-04-02 DIAGNOSIS — Z30011 Encounter for initial prescription of contraceptive pills: Secondary | ICD-10-CM

## 2018-04-02 DIAGNOSIS — Z01419 Encounter for gynecological examination (general) (routine) without abnormal findings: Secondary | ICD-10-CM

## 2018-04-02 DIAGNOSIS — L309 Dermatitis, unspecified: Secondary | ICD-10-CM | POA: Diagnosis not present

## 2018-04-02 DIAGNOSIS — R7309 Other abnormal glucose: Secondary | ICD-10-CM

## 2018-04-02 DIAGNOSIS — L299 Pruritus, unspecified: Secondary | ICD-10-CM

## 2018-04-02 LAB — HEPATIC FUNCTION PANEL
ALT: 25 U/L (ref 0–35)
AST: 22 U/L (ref 0–37)
Albumin: 4.3 g/dL (ref 3.5–5.2)
Alkaline Phosphatase: 131 U/L — ABNORMAL HIGH (ref 39–117)
Bilirubin, Direct: 0.1 mg/dL (ref 0.0–0.3)
Total Bilirubin: 0.4 mg/dL (ref 0.2–1.2)
Total Protein: 7.3 g/dL (ref 6.0–8.3)

## 2018-04-02 LAB — HEMOGLOBIN A1C: Hgb A1c MFr Bld: 6.3 % (ref 4.6–6.5)

## 2018-04-02 LAB — TSH: TSH: 1.82 u[IU]/mL (ref 0.35–4.50)

## 2018-04-02 LAB — BASIC METABOLIC PANEL
BUN: 10 mg/dL (ref 6–23)
CO2: 28 mEq/L (ref 19–32)
Calcium: 9.6 mg/dL (ref 8.4–10.5)
Chloride: 104 mEq/L (ref 96–112)
Creatinine, Ser: 0.9 mg/dL (ref 0.40–1.20)
GFR: 97.72 mL/min (ref >60.00)
Glucose, Bld: 87 mg/dL (ref 70–99)
Potassium: 4.4 mEq/L (ref 3.5–5.1)
Sodium: 141 mEq/L (ref 135–145)

## 2018-04-02 LAB — CBC WITH DIFFERENTIAL/PLATELET
Basophils Absolute: 0 10*3/uL (ref 0.0–0.1)
Basophils Relative: 0.5 % (ref 0.0–3.0)
Eosinophils Absolute: 0.3 10*3/uL (ref 0.0–0.7)
Eosinophils Relative: 4.6 % (ref 0.0–5.0)
HCT: 37.3 % (ref 36.0–46.0)
Hemoglobin: 11.7 g/dL — ABNORMAL LOW (ref 12.0–15.0)
Lymphocytes Relative: 37.7 % (ref 12.0–46.0)
Lymphs Abs: 2.1 10*3/uL (ref 0.7–4.0)
MCHC: 31.4 g/dL (ref 30.0–36.0)
MCV: 67.8 fl — ABNORMAL LOW (ref 78.0–100.0)
Monocytes Absolute: 0.5 10*3/uL (ref 0.1–1.0)
Monocytes Relative: 8.2 % (ref 3.0–12.0)
Neutro Abs: 2.7 10*3/uL (ref 1.4–7.7)
Neutrophils Relative %: 49 % (ref 43.0–77.0)
Platelets: 382 10*3/uL (ref 150.0–400.0)
RBC: 5.5 Mil/uL — ABNORMAL HIGH (ref 3.87–5.11)
RDW: 16.9 % — ABNORMAL HIGH (ref 11.5–15.5)
WBC: 5.6 10*3/uL (ref 4.0–10.5)

## 2018-04-02 LAB — LIPID PANEL
Cholesterol: 149 mg/dL (ref 0–200)
HDL: 39.1 mg/dL (ref >39.00)
LDL Cholesterol: 98 mg/dL (ref 0–99)
NonHDL: 109.9
Total CHOL/HDL Ratio: 4
Triglycerides: 60 mg/dL (ref 0.0–149.0)
VLDL: 12 mg/dL (ref 0.0–40.0)

## 2018-04-02 MED ORDER — CRISABOROLE 2 % EX OINT: 1.0000 "application " | TOPICAL_OINTMENT | Freq: Two times a day (BID) | CUTANEOUS | 2 refills | Status: DC

## 2018-04-02 NOTE — Patient Instructions (Addendum)
checkking lab today   We mayneed to change the metformin since you are having side effects.  I want you to see a dermatologist about the   Eczema   Topical   Moisturize   Sen in [place] but  May need other rx  And is expensive .    Weight loss  Important and getting the itching under control  May help    Plan fu in 3-4 weeks  About the  Medication etc .   Health Maintenance, Female Adopting a healthy lifestyle and getting preventive care can go a long way to promote health and wellness. Talk with your health care provider about what schedule of regular examinations is right for you. This is a good chance for you to check in with your provider about disease prevention and staying healthy. In between checkups, there are plenty of things you can do on your own. Experts have done a lot of research about which lifestyle changes and preventive measures are most likely to keep you healthy. Ask your health care provider for more information. Weight and diet Eat a healthy diet  Be sure to include plenty of vegetables, fruits, low-fat dairy products, and lean protein.  Do not eat a lot of foods high in solid fats, added sugars, or salt.  Get regular exercise. This is one of the most important things you can do for your health. ? Most adults should exercise for at least 150 minutes each week. The exercise should increase your heart rate and make you sweat (moderate-intensity exercise). ? Most adults should also do strengthening exercises at least twice a week. This is in addition to the moderate-intensity exercise.  Maintain a healthy weight  Body mass index (BMI) is a measurement that can be used to identify possible weight problems. It estimates body fat based on height and weight. Your health care provider can help determine your BMI and help you achieve or maintain a healthy weight.  For females 38 years of age and older: ? A BMI below 18.5 is considered underweight. ? A BMI of 18.5 to  24.9 is normal. ? A BMI of 25 to 29.9 is considered overweight. ? A BMI of 30 and above is considered obese.  Watch levels of cholesterol and blood lipids  You should start having your blood tested for lipids and cholesterol at 26 years of age, then have this test every 5 years.  You may need to have your cholesterol levels checked more often if: ? Your lipid or cholesterol levels are high. ? You are older than 26 years of age. ? You are at high risk for heart disease.  Cancer screening Lung Cancer  Lung cancer screening is recommended for adults 53-79 years old who are at high risk for lung cancer because of a history of smoking.  A yearly low-dose CT scan of the lungs is recommended for people who: ? Currently smoke. ? Have quit within the past 15 years. ? Have at least a 30-pack-year history of smoking. A pack year is smoking an average of one pack of cigarettes a day for 1 year.  Yearly screening should continue until it has been 15 years since you quit.  Yearly screening should stop if you develop a health problem that would prevent you from having lung cancer treatment.  Breast Cancer  Practice breast self-awareness. This means understanding how your breasts normally appear and feel.  It also means doing regular breast self-exams. Let your health care provider know about  any changes, no matter how small.  If you are in your 20s or 30s, you should have a clinical breast exam (CBE) by a health care provider every 1-3 years as part of a regular health exam.  If you are 75 or older, have a CBE every year. Also consider having a breast X-ray (mammogram) every year.  If you have a family history of breast cancer, talk to your health care provider about genetic screening.  If you are at high risk for breast cancer, talk to your health care provider about having an MRI and a mammogram every year.  Breast cancer gene (BRCA) assessment is recommended for women who have family  members with BRCA-related cancers. BRCA-related cancers include: ? Breast. ? Ovarian. ? Tubal. ? Peritoneal cancers.  Results of the assessment will determine the need for genetic counseling and BRCA1 and BRCA2 testing.  Cervical Cancer Your health care provider may recommend that you be screened regularly for cancer of the pelvic organs (ovaries, uterus, and vagina). This screening involves a pelvic examination, including checking for microscopic changes to the surface of your cervix (Pap test). You may be encouraged to have this screening done every 3 years, beginning at age 16.  For women ages 57-65, health care providers may recommend pelvic exams and Pap testing every 3 years, or they may recommend the Pap and pelvic exam, combined with testing for human papilloma virus (HPV), every 5 years. Some types of HPV increase your risk of cervical cancer. Testing for HPV may also be done on women of any age with unclear Pap test results.  Other health care providers may not recommend any screening for nonpregnant women who are considered low risk for pelvic cancer and who do not have symptoms. Ask your health care provider if a screening pelvic exam is right for you.  If you have had past treatment for cervical cancer or a condition that could lead to cancer, you need Pap tests and screening for cancer for at least 20 years after your treatment. If Pap tests have been discontinued, your risk factors (such as having a new sexual partner) need to be reassessed to determine if screening should resume. Some women have medical problems that increase the chance of getting cervical cancer. In these cases, your health care provider may recommend more frequent screening and Pap tests.  Colorectal Cancer  This type of cancer can be detected and often prevented.  Routine colorectal cancer screening usually begins at 26 years of age and continues through 26 years of age.  Your health care provider may  recommend screening at an earlier age if you have risk factors for colon cancer.  Your health care provider may also recommend using home test kits to check for hidden blood in the stool.  A small camera at the end of a tube can be used to examine your colon directly (sigmoidoscopy or colonoscopy). This is done to check for the earliest forms of colorectal cancer.  Routine screening usually begins at age 82.  Direct examination of the colon should be repeated every 5-10 years through 26 years of age. However, you may need to be screened more often if early forms of precancerous polyps or small growths are found.  Skin Cancer  Check your skin from head to toe regularly.  Tell your health care provider about any new moles or changes in moles, especially if there is a change in a mole's shape or color.  Also tell your health care provider  if you have a mole that is larger than the size of a pencil eraser.  Always use sunscreen. Apply sunscreen liberally and repeatedly throughout the day.  Protect yourself by wearing long sleeves, pants, a wide-brimmed hat, and sunglasses whenever you are outside.  Heart disease, diabetes, and high blood pressure  High blood pressure causes heart disease and increases the risk of stroke. High blood pressure is more likely to develop in: ? People who have blood pressure in the high end of the normal range (130-139/85-89 mm Hg). ? People who are overweight or obese. ? People who are African American.  If you are 65-63 years of age, have your blood pressure checked every 3-5 years. If you are 3 years of age or older, have your blood pressure checked every year. You should have your blood pressure measured twice-once when you are at a hospital or clinic, and once when you are not at a hospital or clinic. Record the average of the two measurements. To check your blood pressure when you are not at a hospital or clinic, you can use: ? An automated blood pressure  machine at a pharmacy. ? A home blood pressure monitor.  If you are between 33 years and 40 years old, ask your health care provider if you should take aspirin to prevent strokes.  Have regular diabetes screenings. This involves taking a blood sample to check your fasting blood sugar level. ? If you are at a normal weight and have a low risk for diabetes, have this test once every three years after 26 years of age. ? If you are overweight and have a high risk for diabetes, consider being tested at a younger age or more often. Preventing infection Hepatitis B  If you have a higher risk for hepatitis B, you should be screened for this virus. You are considered at high risk for hepatitis B if: ? You were born in a country where hepatitis B is common. Ask your health care provider which countries are considered high risk. ? Your parents were born in a high-risk country, and you have not been immunized against hepatitis B (hepatitis B vaccine). ? You have HIV or AIDS. ? You use needles to inject street drugs. ? You live with someone who has hepatitis B. ? You have had sex with someone who has hepatitis B. ? You get hemodialysis treatment. ? You take certain medicines for conditions, including cancer, organ transplantation, and autoimmune conditions.  Hepatitis C  Blood testing is recommended for: ? Everyone born from [birth year] through 1964. ? Anyone with known risk factors for hepatitis C.  Sexually transmitted infections (STIs)  You should be screened for sexually transmitted infections (STIs) including gonorrhea and chlamydia if: ? You are sexually active and are younger than 26 years of age. ? You are older than 26 years of age and your health care provider tells you that you are at risk for this type of infection. ? Your sexual activity has changed since you were last screened and you are at an increased risk for chlamydia or gonorrhea. Ask your health care provider if you are at  risk.  If you do not have HIV, but are at risk, it may be recommended that you take a prescription medicine daily to prevent HIV infection. This is called pre-exposure prophylaxis (PrEP). You are considered at risk if: ? You are sexually active and do not regularly use condoms or know the HIV status of your partner(s). ? You take drugs  by injection. ? You are sexually active with a partner who has HIV.  Talk with your health care provider about whether you are at high risk of being infected with HIV. If you choose to begin PrEP, you should first be tested for HIV. You should then be tested every 3 months for as long as you are taking PrEP. Pregnancy  If you are premenopausal and you may become pregnant, ask your health care provider about preconception counseling.  If you may become pregnant, take 400 to 800 micrograms (mcg) of folic acid every day.  If you want to prevent pregnancy, talk to your health care provider about birth control (contraception). Osteoporosis and menopause  Osteoporosis is a disease in which the bones lose minerals and strength with aging. This can result in serious bone fractures. Your risk for osteoporosis can be identified using a bone density scan.  If you are 37 years of age or older, or if you are at risk for osteoporosis and fractures, ask your health care provider if you should be screened.  Ask your health care provider whether you should take a calcium or vitamin D supplement to lower your risk for osteoporosis.  Menopause may have certain physical symptoms and risks.  Hormone replacement therapy may reduce some of these symptoms and risks. Talk to your health care provider about whether hormone replacement therapy is right for you. Follow these instructions at home:  Schedule regular health, dental, and eye exams.  Stay current with your immunizations.  Do not use any tobacco products including cigarettes, chewing tobacco, or electronic  cigarettes.  If you are pregnant, do not drink alcohol.  If you are breastfeeding, limit how much and how often you drink alcohol.  Limit alcohol intake to no more than 1 drink per day for nonpregnant women. One drink equals 12 ounces of beer, 5 ounces of wine, or 1 ounces of hard liquor.  Do not use street drugs.  Do not share needles.  Ask your health care provider for help if you need support or information about quitting drugs.  Tell your health care provider if you often feel depressed.  Tell your health care provider if you have ever been abused or do not feel safe at home. This information is not intended to replace advice given to you by your health care provider. Make sure you discuss any questions you have with your health care provider. Document Released: 04/21/2011 Document Revised: 03/13/2016 Document Reviewed: 07/10/2015 Elsevier Interactive Patient Education  [employer].

## 2018-04-05 ENCOUNTER — Telehealth: Payer: Self-pay

## 2018-04-05 LAB — CYTOLOGY - PAP
Chlamydia: NEGATIVE
Diagnosis: NEGATIVE
Neisseria Gonorrhea: NEGATIVE

## 2018-04-05 LAB — RPR: RPR Ser Ql: NONREACTIVE

## 2018-04-05 LAB — HIV ANTIBODY (ROUTINE TESTING W REFLEX): HIV 1&2 Ab, 4th Generation: NONREACTIVE

## 2018-04-05 NOTE — Telephone Encounter (Signed)
Copied from [place] [id number] [id number]. Topic: Inquiry >> Apr 05, 2018  3:10 PM Pricilla Handler wrote: Reason for CRM: Patient called stating that the prescription for Crisaborole (EUCRISA) 2 % OINT that Dr. Regis Bill prescribed has to have a prior authorization. Patient states that the pharmacy will not release the medication without a prior authorization. Please call patient today at [phone]. Patient also states that if Dr. Regis Bill wants her to take anything else in the time being, to let her know.      Thank You!!!!

## 2018-04-05 NOTE — Telephone Encounter (Signed)
PA initiated via CoverMyMeds.  Awaiting determination.  Key: [id number]

## 2018-04-06 NOTE — Telephone Encounter (Signed)
PA approved 02/27/18 through 03/29/2019.

## 2018-04-07 ENCOUNTER — Telehealth: Payer: Self-pay | Admitting: Family Medicine

## 2018-04-07 NOTE — Telephone Encounter (Signed)
Request from [facility] for a prior authorization on Eucrisa 2% ointment, please call [phone].

## 2018-04-09 NOTE — Telephone Encounter (Signed)
Duplicate. See 04/05/18 phone note.

## 2018-04-14 ENCOUNTER — Encounter: Payer: Self-pay | Admitting: Internal Medicine

## 2018-04-19 ENCOUNTER — Ambulatory Visit (INDEPENDENT_AMBULATORY_CARE_PROVIDER_SITE_OTHER): Payer: Federal, State, Local not specified - PPO | Admitting: Orthopedic Surgery

## 2018-04-19 ENCOUNTER — Encounter (INDEPENDENT_AMBULATORY_CARE_PROVIDER_SITE_OTHER): Payer: Self-pay | Admitting: Orthopedic Surgery

## 2018-04-19 VITALS — Ht 66.0 in | Wt 290.0 lb

## 2018-04-19 DIAGNOSIS — M25872 Other specified joint disorders, left ankle and foot: Secondary | ICD-10-CM

## 2018-04-19 NOTE — Progress Notes (Signed)
Office Visit Note   Patient: Glenda Mendez           Date of Birth: 1991-10-11           MRN: [id number] Visit Date: 04/19/2018              Requested by: Burnis Medin, MD [address] [place], [place] [address] PCP: Burnis Medin, MD  Chief Complaint  Patient presents with  . Left Ankle - Routine Post Op    01/2018 ankle scope and debridement       HPI: Patient is a 27 year old woman status post left ankle arthroscopy for impingement.  Patient states she is asymptomatic at this time but she is worried if she increases her work hours that she may cause damage to her ankle.  She states she is doing exercise programs at home stretching and using ice.  She states she has been working without symptoms.  Assessment & Plan: Visit Diagnoses:  1. Impingement of left ankle joint     Plan: Discussed that she can increase her activities as tolerated she has no restrictions she will follow-up as needed.  Follow-Up Instructions: Return if symptoms worsen or fail to improve.   Ortho Exam  Patient is alert, oriented, no adenopathy, well-dressed, normal affect, normal respiratory effort. Examination patient has no pain with range of motion of her ankle there is no tenderness to palpation she has good range of motion.  Imaging: No results found. No images are attached to the encounter.  Labs: Lab Results  Component Value Date   HGBA1C 6.3 04/02/2018   HGBA1C 6.6 (H) 03/23/2017   HGBA1C 6.1 06/26/2016   ESRSEDRATE 17 07/03/2015   REPTSTATUS 03/14/2014 FINAL 03/12/2014   CULT  03/12/2014    No Beta Hemolytic Streptococci Isolated Performed at [facility]     Lab Results  Component Value Date   ALBUMIN 4.3 04/02/2018   ALBUMIN 4.3 03/23/2017   ALBUMIN 3.7 05/31/2015    Body mass index is 46.81 kg/m.  Orders:  No orders of the defined types were placed in this encounter.  No orders of the defined types were placed in this encounter.    Procedures: No procedures performed  Clinical Data: No additional findings.  ROS:  All other systems negative, except as noted in the HPI. Review of Systems  Objective: Vital Signs: Ht 5\' 6"  (1.676 m)   Wt 290 lb (131.5 kg)   LMP 03/26/2018 (Exact Date)   BMI 46.81 kg/m   Specialty Comments:  No specialty comments available.  PMFS History: Patient Active Problem List   Diagnosis Date Noted  . Impingement of left ankle joint 12/02/2017  . Pelvic pain 11/16/2017  . Left ankle pain 06/23/2017  . Ganglion cyst of flexor tendon sheath of finger of right hand 02/02/2017  . Trigger finger, right middle finger 02/02/2017  . Eczema 07/11/2016  . Pruritus 06/26/2016  . Dyspnea and respiratory abnormality 05/08/2016  . Wheezing 05/08/2016  . Elevated testosterone level in female 08/28/2015  . Elevated hemoglobin A1c 08/28/2015  . Abdominal pain, right upper quadrant 06/05/2015  . Abnormal urine 06/05/2015  . Alkaline phosphatase elevation 04/27/2015  . Severe obesity (BMI >= 40) ([place]) 12/19/2014  . Asthma with acute exacerbation 08/29/2014  . Moderate intermittent asthma, uncomplicated 08/13/8526  . Rash, skin 06/20/2014  . Moderate persistent asthma with acute exacerbation in adult 05/27/2014  . Anterior chest wall pain 05/27/2014  . Low grade squamous intraepithelial lesion (LGSIL) on  cervical Pap smear 12/02/2013  . Obesity (BMI 30-39.9) 11/28/2013  . Visit for preventive health examination 11/28/2013  . Encounter for routine gynecological examination 11/28/2013  . Weight gain 11/28/2013  . Asthma 11/28/2013  . Moderate persistent asthma without complication 41/63/8453  . Hemiparesis ([place])   . Oral contraceptive pill surveillance 04/25/2012  . Headache(784.0) 06/04/2011  . Irregular periods 06/04/2011  . Acne 06/04/2011  . BCP (birth control pills) initiation 06/04/2011  . Preventative health care 02/18/2011  . Neoplasm of uncertain behavior of skin 02/18/2011  .  BMI (body mass index), pediatric, 95-99% for age 71/10/2010  . BACK PAIN 09/17/2010  . NECK PAIN 01/02/2009  . FATIGUE 12/06/2008  . Attention deficit hyperactivity disorder (ADHD) 09/03/2007  . UNSPECIFIED HEARING LOSS 09/03/2007  . ALLERGIC RHINITIS 05/19/2007  . ASTHMA 05/19/2007   Past Medical History:  Diagnosis Date  . ADHD (attention deficit hyperactivity disorder)   . Allergy   . Asthma   . BMI (body mass index), pediatric, 95-99% for age 48/10/2010   Is starting to gain weight again and advised and counseled today about reduction for health risk reasons. Patient is very well aware of how to do lifestyle intervention she has done this before.   Marland Kitchen Hearing loss in right ear    with hearing aid bilateral   . Hemiparesis ([place])    rt from neonatal period  . Hypoxia of newborn   . Irregular periods 06/04/2011  . PCOS (polycystic ovarian syndrome)   . Pulmonary hemorrhage of fetus or newborn    108 week C-section [facility] of with birth hypoxia    Family History  Adopted: Yes  Problem Relation Age of Onset  . Hypertension Unknown   . Schizophrenia Mother        with alcohol drug abuse  . Diabetes Other     Past Surgical History:  Procedure Laterality Date  . TONSILLECTOMY     Social History   Occupational History  . Not on file  Tobacco Use  . Smoking status: Never Smoker  . Smokeless tobacco: Never Used  Substance and Sexual Activity  . Alcohol use: No    Alcohol/week: 0.0 oz  . Drug use: No  . Sexual activity: Never    Birth control/protection: Pill

## 2018-04-20 DIAGNOSIS — L2084 Intrinsic (allergic) eczema: Secondary | ICD-10-CM | POA: Diagnosis not present

## 2018-04-21 ENCOUNTER — Other Ambulatory Visit: Payer: Self-pay | Admitting: Internal Medicine

## 2018-04-21 NOTE — Telephone Encounter (Signed)
Copied from [place] [id number]. Topic: Quick Communication - Rx Refill/Question >> Apr 21, 2018  5:56 PM Waylan Rocher, Lumin L wrote: Medication: methylphenidate 36 MG PO CR tablet  Has the patient contacted their pharmacy? Yes.   (Agent: If no, request that the patient contact the pharmacy for the refill.) (Agent: If yes, when and what did the pharmacy advise?)  Preferred Pharmacy (with phone number or street name): [facility], [place] [address] Phone: [phone] Fax: [phone]    Agent: Please be advised that RX refills may take up to 3 business days. We ask that you follow-up with your pharmacy.

## 2018-04-21 NOTE — Telephone Encounter (Signed)
Methylphenidate refill Last Refill:03/29/18 #60 Last OV: 04/02/18 PCP: Dr. Regis Bill Pharmacy: [facility]

## 2018-04-21 NOTE — Addendum Note (Signed)
Addended by: Dimple Nanas on: 04/21/2018 06:32 PM   Modules accepted: Orders

## 2018-04-23 MED ORDER — METHYLPHENIDATE HCL ER (OSM) 36 MG PO TBCR: EXTENDED_RELEASE_TABLET | ORAL | 0 refills | Status: DC

## 2018-04-23 NOTE — Addendum Note (Signed)
Addended byShanon Ace K on: 04/23/2018 01:05 PM   Modules accepted: Orders

## 2018-04-23 NOTE — Telephone Encounter (Signed)
Sent in electronically .  

## 2018-04-23 NOTE — Telephone Encounter (Signed)
Left message notifying patient.

## 2018-05-01 ENCOUNTER — Encounter: Payer: Self-pay | Admitting: Internal Medicine

## 2018-05-05 NOTE — Progress Notes (Signed)
Chief Complaint  Patient presents with  . Follow-up    skin condition    HPI: Glenda Mendez 27 y.o. come in for multiple issues   Saw dermatology and on many topical steroids and doing much bette not haivng to take atarax   working on losing weight   Adding  Drinking water.  App and stopped all sweet beverages  Not on metformin cause of gi upset but will take if important to her health   Some bad headaches at time   Worse over the years.   Right side sore.   After bright lights and heat and better if in cool environs .   back to working [employer]  15   Hours .  Mood good.    ROS: See pertinent positives and negatives per HPI.  Past Medical History:  Diagnosis Date  . ADHD (attention deficit hyperactivity disorder)   . Allergy   . Asthma   . BMI (body mass index), pediatric, 95-99% for age 44/10/2010   Is starting to gain weight again and advised and counseled today about reduction for health risk reasons. Patient is very well aware of how to do lifestyle intervention she has done this before.   Marland Kitchen Hearing loss in right ear    with hearing aid bilateral   . Hemiparesis ([place])    rt from neonatal period  . Hypoxia of newborn   . Irregular periods 06/04/2011  . PCOS (polycystic ovarian syndrome)   . Pulmonary hemorrhage of fetus or newborn    31 week C-section [facility] of with birth hypoxia    Family History  Adopted: Yes  Problem Relation Age of Onset  . Hypertension Unknown   . Schizophrenia Mother        with alcohol drug abuse  . Diabetes Other     Social History   Socioeconomic History  . Marital status: Single    Spouse name: Not on file  . Number of children: Not on file  . Years of education: Not on file  . Highest education level: Not on file  Occupational History  . Not on file  Social Needs  . Financial resource strain: Not on file  . Food insecurity:    Worry: Not on file    Inability: Not on file  . Transportation needs:    Medical: Not on  file    Non-medical: Not on file  Tobacco Use  . Smoking status: Never Smoker  . Smokeless tobacco: Never Used  Substance and Sexual Activity  . Alcohol use: No    Alcohol/week: 0.0 oz  . Drug use: No  . Sexual activity: Never    Birth control/protection: Pill  Lifestyle  . Physical activity:    Days per week: Not on file    Minutes per session: Not on file  . Stress: Not on file  Relationships  . Social connections:    Talks on phone: Not on file    Gets together: Not on file    Attends religious service: Not on file    Active member of club or organization: Not on file    Attends meetings of clubs or organizations: Not on file    Relationship status: Not on file  Other Topics Concern  . Not on file  Social History Narrative   Adopted and raised by family member [name]   [employer] high school  graduate [employer] second semester   Living at home with mom and has no  car   Sleep 8+ hours   Seeing Dr. [name]- pulm at [facility] now has a new pulmonary Dr. Now stable and on prn       Thyroid evaluation in the past neck evaluation within normal limits and no thyroid disease.    Outpatient Medications Prior to Visit  Medication Sig Dispense Refill  . albuterol (PROVENTIL HFA;VENTOLIN HFA) 108 (90 BASE) MCG/ACT inhaler Inhale 2 puffs into the lungs every 6 (six) hours as needed for wheezing or shortness of breath. 1 Inhaler 4  . albuterol (PROVENTIL) (2.5 MG/3ML) 0.083% nebulizer solution Take 3 mLs (2.5 mg total) by nebulization every 4 (four) hours as needed for wheezing or shortness of breath. 75 mL 2  . betamethasone dipropionate (DIPROLENE) 0.05 % cream Apply topically 2 (two) times daily.    Marland Kitchen desonide (DESOWEN) 0.05 % cream Apply topically 2 (two) times daily.    . diclofenac (VOLTAREN) 75 MG EC tablet Take 1 tablet (75 mg total) by mouth 2 (two) times daily. Take 1 tab bid X 10 days then as needed 30 tablet 0  . fluticasone (FLONASE) 50 MCG/ACT nasal spray Place 2  sprays into both nostrils daily. 16 g 6  . fluticasone-salmeterol (ADVAIR HFA) 230-21 MCG/ACT inhaler Inhale 2 puffs into the lungs 2 (two) times daily.    . hydrOXYzine (ATARAX/VISTARIL) 10 MG tablet Take 1 tablet (10 mg total) by mouth daily as needed for itching. 30 tablet 0  . ipratropium (ATROVENT) 0.02 % nebulizer solution USE 1 VIAL BY NEBULIZATION 3 (THREE) TIMES DAILY AS NEEDED FOR WHEEZING. **DUE FOR ANNUAL VISIT June 2019** 62.5 mL 0  . levocetirizine (XYZAL ALLERGY 24HR) 5 MG tablet Take 1 tablet (5 mg total) by mouth every evening. 90 tablet 3  . methylphenidate 36 MG PO CR tablet TAKE (2) TABLETS DAILY. 60 tablet 0  . norgestimate-ethinyl estradiol (ORTHO-CYCLEN, 28,) 0.25-35 MG-MCG tablet Take 1 tablet by mouth daily. 1 Package 5  . omeprazole (PRILOSEC) 20 MG capsule Take 1 capsule (20 mg total) by mouth 2 (two) times daily before a meal. 60 capsule 3  . metFORMIN (GLUCOPHAGE-XR) 500 MG 24 hr tablet Take 1 tablet (500 mg total) by mouth daily with breakfast. Or largest meal increae to 1 twice a day after 2 weeks 60 tablet 5  . Naproxen-Esomeprazole (VIMOVO) 500-20 MG TBEC Take 1 tablet by mouth 2 (two) times daily. (Patient not taking: Reported on 05/06/2018) 60 tablet 2  . pantoprazole (PROTONIX) 40 MG tablet Take 1 tablet (40 mg total) by mouth daily. (Patient not taking: Reported on 05/06/2018) 30 tablet 0  . amoxicillin-clavulanate (AUGMENTIN) 875-125 MG tablet Take 1 tablet by mouth every 12 (twelve) hours. For sinusitis (Patient not taking: Reported on 05/06/2018) 14 tablet 0  . benzonatate (TESSALON PERLES) 100 MG capsule Take 1 capsule (100 mg total) by mouth 3 (three) times daily as needed. (Patient not taking: Reported on 05/06/2018) 20 capsule 0  . Crisaborole (EUCRISA) 2 % OINT Apply 1 application topically 2 (two) times daily. (Patient not taking: Reported on 05/06/2018) 60 g 2   No facility-administered medications prior to visit.      EXAM:  BP 107/84   Pulse 90    Temp 98.3 F (36.8 C)   Wt 276 lb (125.2 kg)   BMI 44.55 kg/m   Body mass index is 44.55 kg/m.  GENERAL: vitals reviewed and listed above, alert, oriented, appears well hydrated and in no acute distress HEENT: atraumatic, conjunctiva  clear, no obvious abnormalities on  inspection of external nose and ears OP : no lesion edema or exudate  NECK: no obvious masses on inspection palpation  LUNGS: clear to auscultation bilaterally, no wheezes, rales or rhonchi, good air movement CV: HRRR, no clubbing cyanosis or  peripheral edema nl cap refill  MS: moves all extremities without noticeable focal  abnormality PSYCH: pleasant and cooperative, no obvious depression or anxiety Lab Results  Component Value Date   WBC 5.6 04/02/2018   HGB 11.7 (L) 04/02/2018   HCT 37.3 04/02/2018   PLT 382.0 04/02/2018   GLUCOSE 87 04/02/2018   CHOL 149 04/02/2018   TRIG 60.0 04/02/2018   HDL 39.10 04/02/2018   LDLCALC 98 04/02/2018   ALT 25 04/02/2018   AST 22 04/02/2018   NA 141 04/02/2018   K 4.4 04/02/2018   CL 104 04/02/2018   CREATININE 0.90 04/02/2018   BUN 10 04/02/2018   CO2 28 04/02/2018   TSH 1.82 04/02/2018   HGBA1C 6.3 04/02/2018   BP Readings from Last 3 Encounters:  05/06/18 107/84  04/02/18 110/80  02/10/18 120/78    Wt Readings from Last 3 Encounters:  05/06/18 276 lb (125.2 kg)  04/19/18 290 lb (131.5 kg)  04/02/18 290 lb (131.5 kg)   Reviewed    Labs and results  Pap normal   ASSESSMENT AND PLAN:  Discussed the following assessment and plan:  Eczema, unspecified type - somuch better  sleep better and losing weight  Medication management - Plan: Alkaline phosphatase, Gamma GT, VITAMIN D 25 Hydroxy (Vit-D Deficiency, Fractures), Hemoglobin A1c  Severe obesity (BMI >= 40) (HCC) - continue counseling   Nonintractable headache, unspecified chronicity pattern, unspecified headache type  Alkaline phosphatase elevation - borderline  - Plan: Alkaline phosphatase, Gamma GT,  VITAMIN D 25 Hydroxy (Vit-D Deficiency, Fractures)  Insulin resistance - Plan: Hemoglobin A1c Weight management to continue   Trial once a day metformin for tolerance  Encouraged    sun triggered .    Heat headaches.   Disc  Management  No other alarm features   Borderline alk phos  And may repeat in 3-4 mos with vit d   a1c et at that time  -Patient advised to return or notify health care team  if  new concerns arise.  Patient Instructions   Headaches sound like  Migraines uncertain triggers.   Your   Lab tests are stable.   Nancy Fetter glasses  when outside .    Try taking metofrmin once a day .  Continue lifestyle intervention healthy eating keep going .   ROV in about  3- 4 months.          Standley Brooking. Dyquan Minks M.D.

## 2018-05-06 ENCOUNTER — Encounter: Payer: Self-pay | Admitting: Internal Medicine

## 2018-05-06 ENCOUNTER — Ambulatory Visit: Payer: Federal, State, Local not specified - PPO | Admitting: Internal Medicine

## 2018-05-06 VITALS — BP 107/84 | HR 90 | Temp 98.3°F | Wt 276.0 lb

## 2018-05-06 DIAGNOSIS — R51 Headache: Secondary | ICD-10-CM

## 2018-05-06 DIAGNOSIS — R519 Headache, unspecified: Secondary | ICD-10-CM

## 2018-05-06 DIAGNOSIS — Z79899 Other long term (current) drug therapy: Secondary | ICD-10-CM

## 2018-05-06 DIAGNOSIS — E8881 Metabolic syndrome: Secondary | ICD-10-CM | POA: Diagnosis not present

## 2018-05-06 DIAGNOSIS — L309 Dermatitis, unspecified: Secondary | ICD-10-CM

## 2018-05-06 DIAGNOSIS — R748 Abnormal levels of other serum enzymes: Secondary | ICD-10-CM | POA: Diagnosis not present

## 2018-05-06 MED ORDER — METFORMIN HCL ER 500 MG PO TB24: 500.0000 mg | ORAL_TABLET | Freq: Every day | ORAL | 3 refills | Status: DC

## 2018-05-06 NOTE — Patient Instructions (Addendum)
  Headaches sound like  Migraines uncertain triggers.   Your   Lab tests are stable.   Nancy Fetter glasses  when outside .    Try taking metofrmin once a day .  Continue lifestyle intervention healthy eating keep going .   ROV in about  3- 4 months.

## 2018-06-15 DIAGNOSIS — L2084 Intrinsic (allergic) eczema: Secondary | ICD-10-CM | POA: Diagnosis not present

## 2018-06-15 DIAGNOSIS — L700 Acne vulgaris: Secondary | ICD-10-CM | POA: Diagnosis not present

## 2018-06-28 ENCOUNTER — Telehealth: Payer: Self-pay | Admitting: Internal Medicine

## 2018-06-28 MED ORDER — METHYLPHENIDATE HCL ER (OSM) 36 MG PO TBCR: EXTENDED_RELEASE_TABLET | ORAL | 0 refills | Status: DC

## 2018-06-28 NOTE — Telephone Encounter (Signed)
Pt aware. Nothing further needed 

## 2018-06-28 NOTE — Addendum Note (Signed)
Addended byShanon Ace K on: 06/28/2018 01:11 PM   Modules accepted: Orders

## 2018-06-28 NOTE — Telephone Encounter (Signed)
methylphenidate refill Last Refill:04/23/18 # 60 Last OV: 04/02/18 PCP: Dr Regis Bill Pharmacy: [facility]

## 2018-06-28 NOTE — Telephone Encounter (Signed)
Please advise Dr Panosh, thanks.   

## 2018-06-28 NOTE — Telephone Encounter (Signed)
Sent in electronically .  

## 2018-06-28 NOTE — Telephone Encounter (Signed)
Copied from [place] [id number]. Topic: Quick Communication - Rx Refill/Question >> Jun 28, 2018  7:51 AM Waylan Rocher, Lumin L wrote: Medication: methylphenidate 36 MG PO CR tablet   Has the patient contacted their pharmacy? Yes.   (Agent: If no, request that the patient contact the pharmacy for the refill.) (Agent: If yes, when and what did the pharmacy advise?)  Preferred Pharmacy (with phone number or street name): [place], [place] [address] Phone: [phone] Fax: [phone]  Agent: Please be advised that RX refills may take up to 3 business days. We ask that you follow-up with your pharmacy.

## 2018-06-30 NOTE — Progress Notes (Signed)
Chief Complaint  Patient presents with  . Follow-up    Pt leaving to go out of town and moved appt up from October.     HPI: Glenda Mendez 27 y.o. come in for Chronic disease management   And meds     Skin  Stable   Staying on topical and moistuizer  HAs  None since then august   29th .  Seems not not be problematic    Sun triggered   Weight eating better   No weight gain   Metfomin  Tolerating once a day  Som gi issues but doing better   Feeling much better  ADHD: same med and seems to still be helpful  Going to [place] with mom( bus trip) but she is doing well.   ROS: See pertinent positives and negatives per HPI.no asthma falre   Past Medical History:  Diagnosis Date  . ADHD (attention deficit hyperactivity disorder)   . Allergy   . Asthma   . BMI (body mass index), pediatric, 95-99% for age 86/10/2010   Is starting to gain weight again and advised and counseled today about reduction for health risk reasons. Patient is very well aware of how to do lifestyle intervention she has done this before.   Marland Kitchen Hearing loss in right ear    with hearing aid bilateral   . Hemiparesis ([place])    rt from neonatal period  . Hypoxia of newborn   . Irregular periods 06/04/2011  . PCOS (polycystic ovarian syndrome)   . Pulmonary hemorrhage of fetus or newborn    13 week C-section [facility] of with birth hypoxia    Family History  Adopted: Yes  Problem Relation Age of Onset  . Hypertension Unknown   . Schizophrenia Mother        with alcohol drug abuse  . Diabetes Other     Social History   Socioeconomic History  . Marital status: Single    Spouse name: Not on file  . Number of children: Not on file  . Years of education: Not on file  . Highest education level: Not on file  Occupational History  . Not on file  Social Needs  . Financial resource strain: Not on file  . Food insecurity:    Worry: Not on file    Inability: Not on file  . Transportation needs:    Medical: Not on  file    Non-medical: Not on file  Tobacco Use  . Smoking status: Never Smoker  . Smokeless tobacco: Never Used  Substance and Sexual Activity  . Alcohol use: No    Alcohol/week: 0.0 standard drinks  . Drug use: No  . Sexual activity: Never    Birth control/protection: Pill  Lifestyle  . Physical activity:    Days per week: Not on file    Minutes per session: Not on file  . Stress: Not on file  Relationships  . Social connections:    Talks on phone: Not on file    Gets together: Not on file    Attends religious service: Not on file    Active member of club or organization: Not on file    Attends meetings of clubs or organizations: Not on file    Relationship status: Not on file  Other Topics Concern  . Not on file  Social History Narrative   Adopted and raised by family member [name]   [employer] high school  graduate [employer] second semester  Living at home with mom and has no car   Sleep 8+ hours   Seeing Dr. [name]- pulm at [facility] now has a new pulmonary Dr. Now stable and on prn       Thyroid evaluation in the past neck evaluation within normal limits and no thyroid disease.    Outpatient Medications Prior to Visit  Medication Sig Dispense Refill  . albuterol (PROVENTIL HFA;VENTOLIN HFA) 108 (90 BASE) MCG/ACT inhaler Inhale 2 puffs into the lungs every 6 (six) hours as needed for wheezing or shortness of breath. 1 Inhaler 4  . albuterol (PROVENTIL) (2.5 MG/3ML) 0.083% nebulizer solution Take 3 mLs (2.5 mg total) by nebulization every 4 (four) hours as needed for wheezing or shortness of breath. 75 mL 2  . betamethasone dipropionate (DIPROLENE) 0.05 % cream Apply topically 2 (two) times daily.    Marland Kitchen desonide (DESOWEN) 0.05 % cream Apply topically 2 (two) times daily.    . diclofenac (VOLTAREN) 75 MG EC tablet Take 1 tablet (75 mg total) by mouth 2 (two) times daily. Take 1 tab bid X 10 days then as needed 30 tablet 0  . fluticasone (FLONASE) 50 MCG/ACT nasal  spray Place 2 sprays into both nostrils daily. 16 g 6  . fluticasone-salmeterol (ADVAIR HFA) 230-21 MCG/ACT inhaler Inhale 2 puffs into the lungs 2 (two) times daily.    . hydrOXYzine (ATARAX/VISTARIL) 10 MG tablet Take 1 tablet (10 mg total) by mouth daily as needed for itching. 30 tablet 0  . ipratropium (ATROVENT) 0.02 % nebulizer solution USE 1 VIAL BY NEBULIZATION 3 (THREE) TIMES DAILY AS NEEDED FOR WHEEZING. **DUE FOR ANNUAL VISIT June 2019** 62.5 mL 0  . levocetirizine (XYZAL ALLERGY 24HR) 5 MG tablet Take 1 tablet (5 mg total) by mouth every evening. 90 tablet 3  . metFORMIN (GLUCOPHAGE-XR) 500 MG 24 hr tablet Take 1 tablet (500 mg total) by mouth daily with breakfast. 90 tablet 3  . methylphenidate 36 MG PO CR tablet TAKE (2) TABLETS DAILY. 60 tablet 0  . Naproxen-Esomeprazole (VIMOVO) 500-20 MG TBEC Take 1 tablet by mouth 2 (two) times daily. 60 tablet 2  . norgestimate-ethinyl estradiol (ORTHO-CYCLEN, 28,) 0.25-35 MG-MCG tablet Take 1 tablet by mouth daily. 1 Package 5  . omeprazole (PRILOSEC) 20 MG capsule Take 1 capsule (20 mg total) by mouth 2 (two) times daily before a meal. 60 capsule 3  . pantoprazole (PROTONIX) 40 MG tablet Take 1 tablet (40 mg total) by mouth daily. 30 tablet 0   No facility-administered medications prior to visit.      EXAM:  BP 108/70 (BP Location: Right Arm, Patient Position: Sitting, Cuff Size: Normal)   Pulse 86   Temp 98.2 F (36.8 C) (Oral)   Wt 276 lb 11.2 oz (125.5 kg)   BMI 44.66 kg/m   Body mass index is 44.66 kg/m.  GENERAL: vitals reviewed and listed above, alert, oriented, appears well hydrated and in no acute distress HEENT: atraumatic, conjunctiva  clear, no obvious abnormalities on inspection of external nose and ears e  NECK: no obvious masses on inspection palpation  LUNGS: clear to auscultation bilaterally, no wheezes, rales or rhonchi, good air movement CV: HRRR, no clubbing cyanosis or  peripheral edema nl cap refill  Skin  fading  Pigment changes from prev inflammatory  Abdomen:  Sof,t normal bowel sounds without hepatosplenomegaly, no guarding rebound or masses no CVA tenderness  MS: moves all extremities without noticeable focal  abnormality PSYCH: pleasant and cooperative, no obvious depression or  anxiety Lab Results  Component Value Date   WBC 5.6 04/02/2018   HGB 11.7 (L) 04/02/2018   HCT 37.3 04/02/2018   PLT 382.0 04/02/2018   GLUCOSE 87 04/02/2018   CHOL 149 04/02/2018   TRIG 60.0 04/02/2018   HDL 39.10 04/02/2018   LDLCALC 98 04/02/2018   ALT 25 04/02/2018   AST 22 04/02/2018   NA 141 04/02/2018   K 4.4 04/02/2018   CL 104 04/02/2018   CREATININE 0.90 04/02/2018   BUN 10 04/02/2018   CO2 28 04/02/2018   TSH 1.82 04/02/2018   HGBA1C 6.3 04/02/2018   BP Readings from Last 3 Encounters:  07/01/18 108/70  05/06/18 107/84  04/02/18 110/80   Wt Readings from Last 3 Encounters:  07/01/18 276 lb 11.2 oz (125.5 kg)  05/06/18 276 lb (125.2 kg)  04/19/18 290 lb (131.5 kg)    ASSESSMENT AND PLAN:  Discussed the following assessment and plan:  Attention deficit hyperactivity disorder (ADHD), unspecified ADHD type  Medication management  Need for influenza vaccination - Plan: Flu Vaccine QUAD 6+ mos PF IM (Fluarix Quad PF)  Alkaline phosphatase elevation  Insulin resistance  Moderate intermittent asthma, uncomplicated  Severe obesity (BMI >= 40) (HCC) Due for labs but early for a1c so make appt after her trip to get   Labs alk phos vit d and a2c etc  Continued efforts to lose weight  In healthy manner .  adhd  Benefit more than risk still so continue med   Consider inc metformin if tolerated   -Patient advised to return or notify health care team  if  new concerns arise.  Patient Instructions  Glad you are doing better .    Continue healthy weight loss .     eczema control.   Get lab appt   For before end of October   If all ok then ROV in 4 months .       Standley Brooking. Allen Egerton M.D.

## 2018-07-01 ENCOUNTER — Ambulatory Visit: Payer: Federal, State, Local not specified - PPO | Admitting: Internal Medicine

## 2018-07-01 ENCOUNTER — Encounter: Payer: Self-pay | Admitting: Internal Medicine

## 2018-07-01 VITALS — BP 108/70 | HR 86 | Temp 98.2°F | Wt 276.7 lb

## 2018-07-01 DIAGNOSIS — R748 Abnormal levels of other serum enzymes: Secondary | ICD-10-CM

## 2018-07-01 DIAGNOSIS — Z79899 Other long term (current) drug therapy: Secondary | ICD-10-CM | POA: Diagnosis not present

## 2018-07-01 DIAGNOSIS — F909 Attention-deficit hyperactivity disorder, unspecified type: Secondary | ICD-10-CM | POA: Diagnosis not present

## 2018-07-01 DIAGNOSIS — Z23 Encounter for immunization: Secondary | ICD-10-CM

## 2018-07-01 DIAGNOSIS — IMO0001 Reserved for inherently not codable concepts without codable children: Secondary | ICD-10-CM

## 2018-07-01 DIAGNOSIS — J4520 Mild intermittent asthma, uncomplicated: Secondary | ICD-10-CM

## 2018-07-01 DIAGNOSIS — E8881 Metabolic syndrome: Secondary | ICD-10-CM

## 2018-07-01 NOTE — Patient Instructions (Signed)
Glad you are doing better .    Continue healthy weight loss .     eczema control.   Get lab appt   For before end of October   If all ok then ROV in 4 months .

## 2018-08-02 ENCOUNTER — Other Ambulatory Visit (INDEPENDENT_AMBULATORY_CARE_PROVIDER_SITE_OTHER): Payer: Federal, State, Local not specified - PPO

## 2018-08-02 DIAGNOSIS — Z79899 Other long term (current) drug therapy: Secondary | ICD-10-CM

## 2018-08-02 DIAGNOSIS — E8881 Metabolic syndrome: Secondary | ICD-10-CM | POA: Diagnosis not present

## 2018-08-02 DIAGNOSIS — R748 Abnormal levels of other serum enzymes: Secondary | ICD-10-CM

## 2018-08-02 LAB — ALKALINE PHOSPHATASE: Alkaline Phosphatase: 137 U/L — ABNORMAL HIGH (ref 39–117)

## 2018-08-02 LAB — HEMOGLOBIN A1C: Hgb A1c MFr Bld: 6.2 % (ref 4.6–6.5)

## 2018-08-02 LAB — GAMMA GT: GGT: 33 U/L (ref 7–51)

## 2018-08-02 LAB — VITAMIN D 25 HYDROXY (VIT D DEFICIENCY, FRACTURES): VITD: 8.08 ng/mL — ABNORMAL LOW (ref 30.00–100.00)

## 2018-08-04 ENCOUNTER — Encounter: Payer: Self-pay | Admitting: Internal Medicine

## 2018-08-04 ENCOUNTER — Ambulatory Visit (INDEPENDENT_AMBULATORY_CARE_PROVIDER_SITE_OTHER): Payer: Federal, State, Local not specified - PPO | Admitting: Internal Medicine

## 2018-08-04 VITALS — BP 132/72 | HR 104 | Ht 67.0 in | Wt 285.0 lb

## 2018-08-04 DIAGNOSIS — J45901 Unspecified asthma with (acute) exacerbation: Secondary | ICD-10-CM

## 2018-08-04 DIAGNOSIS — J4520 Mild intermittent asthma, uncomplicated: Secondary | ICD-10-CM

## 2018-08-04 DIAGNOSIS — IMO0001 Reserved for inherently not codable concepts without codable children: Secondary | ICD-10-CM

## 2018-08-04 LAB — NITRIC OXIDE: Nitric Oxide: 24

## 2018-08-04 MED ORDER — PREDNISONE 10 MG PO TABS: ORAL_TABLET | ORAL | 0 refills | Status: DC

## 2018-08-04 MED ORDER — ALBUTEROL SULFATE (2.5 MG/3ML) 0.083% IN NEBU: 2.5000 mg | INHALATION_SOLUTION | RESPIRATORY_TRACT | 2 refills | Status: DC | PRN

## 2018-08-04 MED ORDER — ALBUTEROL SULFATE HFA 108 (90 BASE) MCG/ACT IN AERS: 2.0000 | INHALATION_SPRAY | Freq: Four times a day (QID) | RESPIRATORY_TRACT | 4 refills | Status: DC | PRN

## 2018-08-04 NOTE — Progress Notes (Signed)
HPI  Chief Complaint  Patient presents with  . Acute Visit    MR pt. pt states she had an asthma attack this morning, EMS was called. pt states breathing improved with 2 neb treatment . pt c/o sob, non prod cough, wheezing & chest tightness.   27 year old obese woman with asthma since childhood.  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 She is maintained on a regimen of Advair, Spiriva was taken off, and Flonase for allergies. She used to follow up with [facility] and had a detailed evaluation. Lung function has been noted to be normal. There has been some concern for vocal cord dysfunction. Sleep studies have been reported normal.  She is adopted. Her mom reports triggers being changes in weather in barometric pressure. She developed sudden onset chest tightness and wheezing around 11 AM-took nebulizer treatment every 3 hours 2, but wheezing is persistent then EMS was called, she was given 2 more nebulizer treatments with improvement-mother also given 40 mg of prednisone based on action plan laid out by [facility]  She feels much improved and is back to normal. Mother feels that she has nasal congestion  She also has lifelong eczema and itching and requests a refill on Vistaril   OV 09/26/2016  Chief Complaint  Patient presents with  . Follow-up    Pt c/o increase in SOB, sinus congestion and blowing out yellow mucus with blood and c/o sore throat. Pt deneis CP/tightness, f/c/s.   IOV 05/08/2016  Chief Complaint  Patient presents with  . Pulnonary Consult    Ref. by Dr. Rudi Coco since 1993. Since 2015-multiple er visits and ems with increased sob and chest tightness.Blue color at times during these episodes.On Prednisone (pulmonary md at [facility]-Dr. Everlean Alstrom md left) on 5 wks. now10 mg daily.no wheezing.   27 year old obese F American female brought in by her adopted mother. Patient is to be followed at [facility]  for asthma. But the doctor there has left. Therefore she is new consultation here and transfer of care. 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 However in her entire life EMS was never called for asthma but in May 2015 in May 2016 this happened. Then in 2016 though multiple episodes of requiring prednisone at least 4 times. Then in the fall of 2016 at [facility] they did blood allergy panel, IgE and complete blood count this was normal except for years and feels that was slightly elevated at 100 cells per cubic millimeter. Patient also mother does not recollect having had an exhaled nitric oxide. At this point in time she was only on Spiriva and Advair. They reported that at that point in time. Tried Singulair for many years but it was not working so there will not taking it. It appears from talking to the family and review of the chart Nucala was considered and patient paid out of pocket for it and took it for 3 months in the early part of 2017 and felt significant relief. However by March 2017 she did not have money to for this out of pocket. Then starting May 2017 through June 2017 multiple EMS calls and requiring prednisone repeatedly that one month. Then in June 2017 Dr. Deeann Dowse started chronic daily prednisone till she saw me this visit. Patient's currently taking 10 mg per day. According to the  patient on the mom this is helped her significantly and she is feeling much improved on the combination of prednisone Spiriva and Advair. Nevertheless asthma control questionnaire shows a score of 2 suggesting significant active symptoms. At night she hardly ever wakes up because of asthma. When she wakes up she has moderate symptoms. She is limited extremely with her daily activities because of asthma and she has a little shortness of breath she is not wheezing or using albuterol for  rescue this past week.  Of note only function tests is always been normal at [facility]. Blood allergy panel has been normal IgE has been normal complete blood count has been normal.  This no history of CT chest with CT sinus a sleep apnea workup in the last few years. Patient does not recollect having had an echocardiogram.       Asthma related data  This was 6 2001: Upper endoscopy biopsy showed gastroesophageal reflux  10/07/2003 as a child she had sleep study which is reported as normal at [facility]  12/30/2007 she had CT neck and CT brain: She showed no masses or lymphadenopathy in the neck but had long segment high-grade narrowing of the right common carotid with reconstitution of the bifurcation. But normal range  01/23/2014 pulmonary function test at [facility] 4.09L//97%, FEV1 2.44 L/95% ratio of 87. Total lung capacity and DLCO not available. Off note she also had normal spirometry and total lung capacity in 2014 and 2013  07/16/2015: Blood allergy panel IgE was negative including Aspergillus fumigatus. She had eosinophils of 3.1% with absolute eosinophil count of 260. Review of her use of the count in our chart shows eosinophils just over 200 cells per cubic millimeter for many years. She had a blood IgE of 132 [in 2005 it was normal in the 30s] which is within normal range at [facility]   has a past medical history of Asthma; Pulmonary hemorrhage of fetus or newborn; Hypoxia of newborn; Hearing loss in right ear; Hemiparesis ([place]); Allergy; ADHD (attention deficit hyperactivity disorder); Irregular periods (06/04/2011); BMI (body mass index), pediatric, 95-99% for age (02/18/2011); and PCOS (polycystic ovarian syndrome).Let Glenda Mendez 05-26-1991  Know that CT sinus and CT chest and PFT all normal   OV 05/15/2016  Chief Complaint  Patient presents with  . Follow-up    review echo, ct, and pft.  pt has no change in breathing since last OV.        Ct Chest High Resolution  Result Date: 05/13/2016 CLINICAL DATA:  27 year old female with history of chronic shortness of breath. Evaluate for potential interstitial lung disease. EXAM: CT CHEST WITHOUT CONTRAST TECHNIQUE: Multidetector CT imaging of the chest was performed following the standard protocol without intravenous contrast. High resolution imaging of the lungs, as well as inspiratory and expiratory imaging, was performed. COMPARISON:  No priors. FINDINGS: Cardiovascular: Heart size is normal. There is no significant pericardial fluid, thickening or pericardial calcification. No atherosclerotic calcifications are noted in the thoracic aorta or the coronary arteries. Mediastinum/Nodes: No pathologically enlarged mediastinal or hilar lymph nodes. Please note that accurate exclusion of hilar adenopathy is limited on noncontrast CT scans. Esophagus is unremarkable in appearance. No axillary lymphadenopathy. Lungs/Pleura: High-resolution images demonstrate no significant regions of ground-glass attenuation, subpleural reticulation, parenchymal banding, traction bronchiectasis or frank honeycombing to indicate interstitial lung disease. Inspiratory and expiratory imaging demonstrates some mild air trapping, indicative of mild small airways disease. No acute consolidative airspace disease. No pleural effusions. No suspicious appearing  pulmonary nodules or masses. Upper Abdomen: Unremarkable. Musculoskeletal: There are no aggressive appearing lytic or blastic lesions noted in the visualized portions of the skeleton. IMPRESSION: 1. No findings to suggest interstitial lung disease. 2. Mild air trapping, indicative of mild small airways disease. 3. No acute findings in the thorax. Electronically Signed   By: [name] M.D.   On: 05/13/2016 09:59  Ct Maxillofacial Ltd Wo Cm  Result Date: 05/12/2016 CLINICAL DATA:  Asthma. Chronic sinus infections with interstitial lung disease. EXAM: CT  PARANASAL SINUS LIMITED WITHOUT CONTRAST TECHNIQUE: Non-contiguous multidetector CT images of the paranasal sinuses were obtained in a single plane without contrast. COMPARISON:  None. FINDINGS: The paranasal sinuses are well aerated. There is no mucoperiosteal thickening or air-fluid levels. The visualized mastoid air cells and middle ears are clear. No orbital abnormalities are seen. The visualized intracranial contents are unremarkable. IMPRESSION: Negative limited paranasal sinus CT. No evidence of active sinus disease. Electronically Signed   By: [name] M.D.   On: 05/12/2016 19:51    Echocardiogram 05/14/2016: Normal   OV 06/12/2016  Chief Complaint  Patient presents with  . Follow-up    Pt here after CPST. Pt denies change in SOB and cough. Pt states she has dry skin under her left eye.    Follow-up history of asthma with dyspnea. Here to review cardiopulmonary stress test results. This was done 05/19/2016 and I reviewed it. Obesity and diastolic dysfunction are major cause of dyspnea. Exercise induced bronchospasm response was borderline while she was on Advair and 5 mg daily prednisone. However today she is in stable health and exhaled nitric oxide while on the same Advair and prednisone is:  Feno - 6ppb. She and her stepmom somewhat reluctant to accept the findings of obesity and diastolic dysfunction. Mom's concern is why patient exacerbates every May 2017. But she is agreed to follow this. They're in agreement that prednisone needs to be tapered off as soon as possible. She was started on long-term prednisone in May 2017.   OV 09/26/2016  Chief Complaint  Patient presents with  . Follow-up    Pt c/o increase in SOB, sinus congestion and blowing out yellow mucus with blood and c/o sore throat. Pt deneis CP/tightness, f/c/s.      Follow-up chronic dyspnea due to obesity and diastolic dysfunction. Has a history of asthma but is consistently had normal exhaled nitric  oxide  Been able to get her off daily prednisone he did she is tolerating that fine. She's currently on Advair and Spiriva. She says that asthma is under control. No interim albuterol use. No nocturnal awakenings. No wheeze. Albuterol use is rare. For the last day or so she's had runny nose with sinus congestion with yellow discharge. No fever but has mild malaise   OV 06/24/2017  Chief Complaint  Patient presents with  . Follow-up    Pt. was last seen for acute rhinitis. Pt states that she is doing better since last visit but not back to normal baseline.    Follow-up dyspnea due to obesity and diastolic dysfunction. Has history of asthma previously on prednisone. Currently only on adavir. . She is consistently normal exhaled nitric oxide  Last visit December 2017. Since then she tells me that in February 2018 she had an asthma exacerbation treated with prednisone. Most recently a month ago she has had some I deficiency anemia she is on iron tablets. She tells me that for the first time she has a job at [employer]. She gets tired after day's work and wants to understand why. Of morbid obesity still continues. She is using advair regularly. In the last one month it is normal albuterol rescue use or my time awakenings or chest tightness or wheezing. Asthma is felt to be under good control. Review of the chart shows that she's not had any allergy test IgE tests s     OV 08/04/2018  Subjective:  Patient ID: Glenda Mendez, female , DOB: 1991/02/18 , age 77 y.o. , MRN: [id number] , ADDRESS: [place] [id number]   08/04/2018 -   Chief Complaint  Patient presents with  . Follow-up    shortness of breath, coughing    Follow-up dyspnea due to obesity and diastolic dysfunction. Has history of asthma previously on prednisone. Currently only on adavir. . She is consistently normal exhaled nitric oxide   HPI Glenda Mendez 27 y.o. -presents for routine  follow-up.  Last visit was 1 year ago.  At that time we checked a blood allergy panel and IgE and this was normal.  Therefore we stopped her Spiriva.  We asked her to cut down on her Advair HFA.  She tells me that she did all that and she was doing well.  Therefore over the summer she stopped her Advair did now with the weather change for the last 3 days and with her being on Advair she is reporting more symptoms with cough and chest tightness and wheezing and sore throat but no fever or sputum production.  She feels she is in an asthma exacerbation.  Therefore she is back on her Advair at full dose.  She is already had her flu shot.  She feels she may benefit from a prednisone short burst.  She is not on daily maintenance prednisone.     Ast herehma Control Panel 05/08/2016  06/12/16 07/11/16 08/04/2018    Spiriva, Advair, daily prednisone since June   Spiriva and Advair    ACQ 5 point- 1 week. wtd avg score. <1.0 is good control 0.75-1.25 is grey zone. >1.25 poor control. Delta 0.5 is clinically meaningful 2     FeNO ppB 7ppbDone while on chronic steroids and normal  6ppb 8 24  FeV1       Planned intervention  for visit        Results for Glenda, Mendez (MRN [id number]) as of 06/24/2017 10:25  Ref. Range 03/23/2017 09:44 06/09/2017 16:10  Eosinophils Absolute Latest Ref Range: 0.0 - 0.7 K/uL 0.2 0.3   ROS - per HPI     has a past medical history of ADHD (attention deficit hyperactivity disorder), Allergy, Asthma, BMI (body mass index), pediatric, 95-99% for age (02/18/2011), Hearing loss in right ear, Hemiparesis ([place]), Hypoxia of newborn, Irregular periods (06/04/2011), PCOS (polycystic ovarian syndrome), and Pulmonary hemorrhage of fetus or newborn.   reports that she has never smoked. She has never used smokeless tobacco.  Past Surgical History:  Procedure Laterality Date  . TONSILLECTOMY      No Known Allergies  Immunization History  Administered Date(s) Administered  . H1N1  11/03/2008  . Hepatitis A 11/26/2007, 11/20/2009  . Hpv 10/28/2006, 11/26/2007, 11/20/2009  . Influenza Split 07/29/2011, 07/01/2012  . Influenza Whole 08/31/2007, 08/02/2008, 06/18/2010  . Influenza,inj,Quad PF,6+ Mos 08/05/2013, 06/20/2014, 06/12/2015, 06/26/2016, 06/24/2017, 07/01/2018  . Meningococcal Polysaccharide 11/26/2007  . Pneumococcal Polysaccharide-23 11/27/2015  . Td 02/25/2017  . Varicella 11/26/2007    Family History  Adopted: Yes  Problem  Relation Age of Onset  . Hypertension Unknown   . Schizophrenia Mother        with alcohol drug abuse  . Diabetes Other      Current Outpatient Medications:  .  albuterol (PROVENTIL HFA;VENTOLIN HFA) 108 (90 Base) MCG/ACT inhaler, Inhale 2 puffs into the lungs every 6 (six) hours as needed for wheezing or shortness of breath., Disp: 1 Inhaler, Rfl: 4 .  albuterol (PROVENTIL) (2.5 MG/3ML) 0.083% nebulizer solution, Take 3 mLs (2.5 mg total) by nebulization every 4 (four) hours as needed for wheezing or shortness of breath., Disp: 75 mL, Rfl: 2 .  betamethasone dipropionate (DIPROLENE) 0.05 % cream, Apply topically 2 (two) times daily., Disp: , Rfl:  .  desonide (DESOWEN) 0.05 % cream, Apply topically 2 (two) times daily., Disp: , Rfl:  .  diclofenac (VOLTAREN) 75 MG EC tablet, Take 1 tablet (75 mg total) by mouth 2 (two) times daily. Take 1 tab bid X 10 days then as needed, Disp: 30 tablet, Rfl: 0 .  fluticasone (FLONASE) 50 MCG/ACT nasal spray, Place 2 sprays into both nostrils daily., Disp: 16 g, Rfl: 6 .  fluticasone-salmeterol (ADVAIR HFA) 230-21 MCG/ACT inhaler, Inhale 2 puffs into the lungs 2 (two) times daily., Disp: , Rfl:  .  hydrOXYzine (ATARAX/VISTARIL) 10 MG tablet, Take 1 tablet (10 mg total) by mouth daily as needed for itching., Disp: 30 tablet, Rfl: 0 .  ipratropium (ATROVENT) 0.02 % nebulizer solution, USE 1 VIAL BY NEBULIZATION 3 (THREE) TIMES DAILY AS NEEDED FOR WHEEZING. **DUE FOR ANNUAL VISIT June 2019**, Disp:  62.5 mL, Rfl: 0 .  levocetirizine (XYZAL ALLERGY 24HR) 5 MG tablet, Take 1 tablet (5 mg total) by mouth every evening., Disp: 90 tablet, Rfl: 3 .  metFORMIN (GLUCOPHAGE-XR) 500 MG 24 hr tablet, Take 1 tablet (500 mg total) by mouth daily with breakfast., Disp: 90 tablet, Rfl: 3 .  methylphenidate 36 MG PO CR tablet, TAKE (2) TABLETS DAILY., Disp: 60 tablet, Rfl: 0 .  Naproxen-Esomeprazole (VIMOVO) 500-20 MG TBEC, Take 1 tablet by mouth 2 (two) times daily., Disp: 60 tablet, Rfl: 2 .  norgestimate-ethinyl estradiol (ORTHO-CYCLEN, 28,) 0.25-35 MG-MCG tablet, Take 1 tablet by mouth daily., Disp: 1 Package, Rfl: 5 .  omeprazole (PRILOSEC) 20 MG capsule, Take 1 capsule (20 mg total) by mouth 2 (two) times daily before a meal., Disp: 60 capsule, Rfl: 3 .  pantoprazole (PROTONIX) 40 MG tablet, Take 1 tablet (40 mg total) by mouth daily., Disp: 30 tablet, Rfl: 0 .  predniSONE (DELTASONE) 10 MG tablet, 40mg  x1 day 30mg  x1 day 20mg  x1 day 10mg  x1 day 5mg  x1 day STOP, Disp: 20 tablet, Rfl: 0      Objective:   Vitals:   08/04/18 1205  BP: 132/72  Pulse: (!) 104  SpO2: 98%  Weight: 285 lb (129.3 kg)  Height: 5\' 7"  (1.702 m)    Estimated body mass index is 44.64 kg/m as calculated from the following:   Height as of this encounter: 5\' 7"  (1.702 m).   Weight as of this encounter: 285 lb (129.3 kg).  @WEIGHTCHANGE @  [employer]   08/04/18 1205  Weight: 285 lb (129.3 kg)     Physical Exam  General Appearance:    Alert, cooperative, no distress, appears stated age - yes , Deconditioned looking - no , OBESE  - yes, Sitting on Wheelchair -  no  Head:    Normocephalic, without obvious abnormality, atraumatic  Eyes:    PERRL, conjunctiva/corneas clear,  Ears:    Normal TM's and external ear canals, both ears  Nose:   Nares normal, septum midline, mucosa normal, no drainage    or sinus tenderness. OXYGEN ON  - no . Patient is @ ra   Throat:   Lips, mucosa, and tongue normal; teeth and gums  normal. Cyanosis on lips - no  Neck:   Supple, symmetrical, trachea midline, no adenopathy;    thyroid:  no enlargement/tenderness/nodules; no carotid   bruit or JVD  Back:     Symmetric, no curvature, ROM normal, no CVA tenderness  Lungs:     Distress - no , Wheeze no, Barrell Chest - no, Purse lip breathing - no, Crackles - no   Chest Wall:    No tenderness or deformity.    Heart:    Regular rate and rhythm, S1 and S2 normal, no rub   or gallop, Murmur - no  Breast Exam:    NOT DONE  Abdomen:     Soft, non-tender, bowel sounds active all four quadrants,    no masses, no organomegaly. Visceral obesity - yes  Genitalia:   NOT DONE  Rectal:   NOT DONE  Extremities:   Extremities - normal, Has Cane - no, Clubbing - no, Edema - no  Pulses:   2+ and symmetric all extremities  Skin:   Stigmata of Connective Tissue Disease - no  Lymph nodes:   Cervical, supraclavicular, and axillary nodes normal  Psychiatric:  Neurologic:   Pleasant - yes, Anxious - no, Flat affect - no  CAm-ICU - neg, Alert and Oriented x 3 - yes, Moves all 4s - yes, Speech - normal, Cognition - intact           Assessment:       ICD-10-CM   1. Moderate intermittent asthma, uncomplicated Z61.09 Nitric oxide  2. Moderate asthma with acute exacerbation, unspecified whether persistent J45.901        Plan:     Patient Instructions     ICD-10-CM   1. Moderate intermittent asthma, uncomplicated U04.54 Nitric oxide  2. Moderate asthma with acute exacerbation, unspecified whether persistent J45.901     Mild asthma flare up due to weather change  Plan Glad back on advair HFA 2 puff twice daily Alb as needed Please take prednisone 40 mg x1 day, then 30 mg x1 day, then 20 mg x1 day, then 10 mg x1 day, and then 5 mg x1 day and stop  Followup 6 months or sooner if needed     SIGNATURE    Dr. Brand Males, M.D., F.C.C.P,  Pulmonary and Critical Care Medicine Staff Physician, [place]  Director - Interstitial Lung Disease  Program  Pulmonary [place] at [place], [place], [address]  Pager: [phone], If no answer or between  15:00h - 7:00h: call [phone] Telephone: [phone]  12:23 PM 08/04/2018

## 2018-08-04 NOTE — Patient Instructions (Signed)
ICD-10-CM   1. Moderate intermittent asthma, uncomplicated K06.34 Nitric oxide  2. Moderate asthma with acute exacerbation, unspecified whether persistent J45.901     Mild asthma flare up due to weather change  Plan Glad back on advair HFA 2 puff twice daily Alb as needed Please take prednisone 40 mg x1 day, then 30 mg x1 day, then 20 mg x1 day, then 10 mg x1 day, and then 5 mg x1 day and stop  Followup 6 months or sooner if needed

## 2018-08-16 ENCOUNTER — Ambulatory Visit: Payer: Federal, State, Local not specified - PPO | Admitting: Internal Medicine

## 2018-08-18 ENCOUNTER — Other Ambulatory Visit: Payer: Self-pay | Admitting: Internal Medicine

## 2018-08-20 NOTE — Telephone Encounter (Signed)
Last filled 06/28/18 #60 Upcoming visit 11/01/2018 Please advise Dr Regis Bill, thanks.

## 2018-08-20 NOTE — Telephone Encounter (Signed)
Sent in electronically .   Please see   Result note for lab to rx vit d  And fu

## 2018-08-25 ENCOUNTER — Other Ambulatory Visit: Payer: Self-pay | Admitting: Internal Medicine

## 2018-08-25 MED ORDER — VITAMIN D (ERGOCALCIFEROL) 1.25 MG (50000 UNIT) PO CAPS: 50000.0000 [IU] | ORAL_CAPSULE | ORAL | 0 refills | Status: DC

## 2018-08-31 ENCOUNTER — Other Ambulatory Visit: Payer: Self-pay

## 2018-08-31 MED ORDER — FLUTICASONE-SALMETEROL 230-21 MCG/ACT IN AERO: 2.0000 | INHALATION_SPRAY | Freq: Two times a day (BID) | RESPIRATORY_TRACT | 5 refills | Status: AC

## 2018-09-01 ENCOUNTER — Encounter: Payer: Self-pay | Admitting: Family Medicine

## 2018-09-01 ENCOUNTER — Ambulatory Visit: Payer: Federal, State, Local not specified - PPO | Admitting: Family Medicine

## 2018-09-01 VITALS — BP 120/80 | HR 99 | Temp 98.6°F | Wt 286.0 lb

## 2018-09-01 DIAGNOSIS — J069 Acute upper respiratory infection, unspecified: Secondary | ICD-10-CM | POA: Diagnosis not present

## 2018-09-01 DIAGNOSIS — B9789 Other viral agents as the cause of diseases classified elsewhere: Secondary | ICD-10-CM

## 2018-09-01 NOTE — Patient Instructions (Signed)
Upper Respiratory Infection, Adult Most upper respiratory infections (URIs) are a viral infection of the air passages leading to the lungs. A URI affects the nose, throat, and upper air passages. The most common type of URI is nasopharyngitis and is typically referred to as "the common cold." URIs run their course and usually go away on their own. Most of the time, a URI does not require medical attention, but sometimes a bacterial infection in the upper airways can follow a viral infection. This is called a secondary infection. Sinus and middle ear infections are common types of secondary upper respiratory infections. Bacterial pneumonia can also complicate a URI. A URI can worsen asthma and chronic obstructive pulmonary disease (COPD). Sometimes, these complications can require emergency medical care and may be life threatening. What are the causes? Almost all URIs are caused by viruses. A virus is a type of germ and can spread from one person to another. What increases the risk? You may be at risk for a URI if:  You smoke.  You have chronic heart or lung disease.  You have a weakened defense (immune) system.  You are very young or very old.  You have nasal allergies or asthma.  You work in crowded or poorly ventilated areas.  You work in health care facilities or schools.  What are the signs or symptoms? Symptoms typically develop 2-3 days after you come in contact with a cold virus. Most viral URIs last 7-10 days. However, viral URIs from the influenza virus (flu virus) can last 14-18 days and are typically more severe. Symptoms may include:  Runny or stuffy (congested) nose.  Sneezing.  Cough.  Sore throat.  Headache.  Fatigue.  Fever.  Loss of appetite.  Pain in your forehead, behind your eyes, and over your cheekbones (sinus pain).  Muscle aches.  How is this diagnosed? Your health care provider may diagnose a URI by:  Physical exam.  Tests to check that your  symptoms are not due to another condition such as: ? Strep throat. ? Sinusitis. ? Pneumonia. ? Asthma.  How is this treated? A URI goes away on its own with time. It cannot be cured with medicines, but medicines may be prescribed or recommended to relieve symptoms. Medicines may help:  Reduce your fever.  Reduce your cough.  Relieve nasal congestion.  Follow these instructions at home:  Take medicines only as directed by your health care provider.  Gargle warm saltwater or take cough drops to comfort your throat as directed by your health care provider.  Use a warm mist humidifier or inhale steam from a shower to increase air moisture. This may make it easier to breathe.  Drink enough fluid to keep your urine clear or pale yellow.  Eat soups and other clear broths and maintain good nutrition.  Rest as needed.  Return to work when your temperature has returned to normal or as your health care provider advises. You may need to stay home longer to avoid infecting others. You can also use a face mask and careful hand washing to prevent spread of the virus.  Increase the usage of your inhaler if you have asthma.  Do not use any tobacco products, including cigarettes, chewing tobacco, or electronic cigarettes. If you need help quitting, ask your health care provider. How is this prevented? The best way to protect yourself from getting a cold is to practice good hygiene.  Avoid oral or hand contact with people with cold symptoms.  Wash your   hands often if contact occurs.  There is no clear evidence that vitamin C, vitamin E, echinacea, or exercise reduces the chance of developing a cold. However, it is always recommended to get plenty of rest, exercise, and practice good nutrition. Contact a health care provider if:  You are getting worse rather than better.  Your symptoms are not controlled by medicine.  You have chills.  You have worsening shortness of breath.  You have  brown or red mucus.  You have yellow or brown nasal discharge.  You have pain in your face, especially when you bend forward.  You have a fever.  You have swollen neck glands.  You have pain while swallowing.  You have white areas in the back of your throat. Get help right away if:  You have severe or persistent: ? Headache. ? Ear pain. ? Sinus pain. ? Chest pain.  You have chronic lung disease and any of the following: ? Wheezing. ? Prolonged cough. ? Coughing up blood. ? A change in your usual mucus.  You have a stiff neck.  You have changes in your: ? Vision. ? Hearing. ? Thinking. ? Mood. This information is not intended to replace advice given to you by your health care provider. Make sure you discuss any questions you have with your health care provider. Document Released: 04/01/2001 Document Revised: 06/08/2016 Document Reviewed: 01/11/2014 Elsevier Interactive Patient Education  2018 Elsevier Inc.  

## 2018-09-01 NOTE — Progress Notes (Signed)
Subjective:    Patient ID: Glenda Mendez, female    DOB: 11/01/1990, 27 y.o.   MRN: [id number]  No chief complaint on file. Patient is accompanied by her mother.  HPI Patient was seen today for acute concern.  Pt endorses productive cough, chills, sore throat x4 days.  Symptoms began with cough on Friday.  Pt endorses post tussive emesis.  Pt denies fever, ear pain or pressure, facial pain or pressure, or myalgias.  Pt has tried Allegra, breathing treatments, albuterol inhaler, and gargling with warm salt water for her symptoms.  Per pt's mother, she recently completed a steroid course.  Past Medical History:  Diagnosis Date  . ADHD (attention deficit hyperactivity disorder)   . Allergy   . Asthma   . BMI (body mass index), pediatric, 95-99% for age 31/10/2010   Is starting to gain weight again and advised and counseled today about reduction for health risk reasons. Patient is very well aware of how to do lifestyle intervention she has done this before.   Marland Kitchen Hearing loss in right ear    with hearing aid bilateral   . Hemiparesis ([place])    rt from neonatal period  . Hypoxia of newborn   . Irregular periods 06/04/2011  . PCOS (polycystic ovarian syndrome)   . Pulmonary hemorrhage of fetus or newborn    21 week C-section [facility] of with birth hypoxia    No Known Allergies  ROS General: Denies fever, night sweats, changes in weight, changes in appetite  +chills HEENT: Denies headaches, ear pain, changes in vision, rhinorrhea   + sore throat CV: Denies CP, palpitations, SOB, orthopnea Pulm: Denies SOB, wheezing  +cough, posttussive emesis. GI: Denies abdominal pain, nausea, vomiting, diarrhea, constipation GU: Denies dysuria, hematuria, frequency, vaginal discharge Msk: Denies muscle cramps, joint pains Neuro: Denies weakness, numbness, tingling Skin: Denies rashes, bruising Psych: Denies depression, anxiety, hallucinations    Objective:    Blood pressure 120/80, pulse 99, temperature  98.6 F (37 C), weight 286 lb (129.7 kg), SpO2 98 %.   Gen. Pleasant, well-nourished, in no distress, normal affect  HEENT: [place]/AT, face symmetric, no scleral icterus, PERRLA, nares patent with clear drainage, pharynx with mild erythema, no exudate.  TMs full bilaterally.  No cervical lymphadenopathy. Lungs: coughing, no accessory muscle use, CTAB, no wheezes or rales Cardiovascular: RRR, no m/r/g, no peripheral edema Neuro:  A&Ox3, CN II-XII intact, normal gait  Wt Readings from Last 3 Encounters:  08/04/18 285 lb (129.3 kg)  07/01/18 276 lb 11.2 oz (125.5 kg)  05/06/18 276 lb (125.2 kg)    Lab Results  Component Value Date   WBC 5.6 04/02/2018   HGB 11.7 (L) 04/02/2018   HCT 37.3 04/02/2018   PLT 382.0 04/02/2018   GLUCOSE 87 04/02/2018   CHOL 149 04/02/2018   TRIG 60.0 04/02/2018   HDL 39.10 04/02/2018   LDLCALC 98 04/02/2018   ALT 25 04/02/2018   AST 22 04/02/2018   NA 141 04/02/2018   K 4.4 04/02/2018   CL 104 04/02/2018   CREATININE 0.90 04/02/2018   BUN 10 04/02/2018   CO2 28 04/02/2018   TSH 1.82 04/02/2018   HGBA1C 6.2 08/02/2018    Assessment/Plan:  Viral URI with cough  -Given history of asthma discussed scheduling neb treatments for the next few days. -Discussed supportive care including Delsym or Mucinex for cough, gargling with warm salt water or Chloraseptic spray for sore throat.  Can also take a teaspoon of honey for cough. -Given  RTC or ED precautions -Given handout -Given note for work  Follow-up PRN  Grier Mitts, MD

## 2018-10-05 DIAGNOSIS — L2084 Intrinsic (allergic) eczema: Secondary | ICD-10-CM | POA: Diagnosis not present

## 2018-10-05 DIAGNOSIS — L700 Acne vulgaris: Secondary | ICD-10-CM | POA: Diagnosis not present

## 2018-10-19 ENCOUNTER — Other Ambulatory Visit: Payer: Self-pay | Admitting: Internal Medicine

## 2018-10-19 MED ORDER — METFORMIN HCL ER 500 MG PO TB24: 500.0000 mg | ORAL_TABLET | Freq: Every day | ORAL | 0 refills | Status: AC

## 2018-10-19 MED ORDER — NORGESTIMATE-ETH ESTRADIOL 0.25-35 MG-MCG PO TABS: 1.0000 | ORAL_TABLET | Freq: Every day | ORAL | 5 refills | Status: DC

## 2018-10-19 NOTE — Telephone Encounter (Signed)
Copied from [place] [id number]. Topic: Quick Communication - Rx Refill/Question >> Oct 19, 2018 10:58 AM Percell Belt A wrote: Medication:  metFORMIN (GLUCOPHAGE-XR) 500 MG 24 hr tablet [[id number]] methylphenidate 36 MG PO CR tablet [[id number]] norgestimate-ethinyl estradiol (ORTHO-CYCLEN, 28,) 0.25-35 MG-MCG tablet [[id number]]   Has the patient contacted their pharmacy? No - pt has an appt on 1/13 with PCP.  She is has not been taking her birth control but she wants to start taking it again.   (Agent: If no, request that the patient contact the pharmacy for the refill.) (Agent: If yes, when and what did the pharmacy advise?)  Preferred Pharmacy (with phone number or street name): [place], [place] [phone] (Phone)   Agent: Please be advised that RX refills may take up to 3 business days. We ask that you follow-up with your pharmacy.

## 2018-10-19 NOTE — Telephone Encounter (Signed)
Requested medication (s) are due for refill today: Yes  Requested medication (s) are on the active medication list: Yes  Last refill:  08/20/18  Future visit scheduled: Yes  Notes to clinic:  See request    Requested Prescriptions  Pending Prescriptions Disp Refills   methylphenidate 36 MG PO CR tablet 60 tablet 0    Sig: TAKE (2) TABLETS BY MOUTH DAILY.     Not Delegated - Psychiatry:  Stimulants/ADHD Failed - 10/19/2018 11:53 AM      Failed - This refill cannot be delegated      Failed - Urine Drug Screen completed in last 360 days.      Passed - Valid encounter within last 3 months    Recent Outpatient Visits          1 month ago Viral URI with cough   [place] at [place], [place]   3 months ago Attention deficit hyperactivity disorder (ADHD), unspecified ADHD type   [facility] at [facility], Standley Brooking, MD   5 months ago Eczema, unspecified type   [facility] at [facility], Standley Brooking, MD   6 months ago Visit for preventive health examination   [facility] at [facility], Standley Brooking, MD   8 months ago Acute sinusitis, recurrence not specified, unspecified location   [facility] at [facility], Standley Brooking, MD      Future Appointments            In 1 week Panosh, Standley Brooking, MD [place] at [place], [facility]         Signed Prescriptions Disp Refills   norgestimate-ethinyl estradiol (ORTHO-CYCLEN, 28,) 0.25-35 MG-MCG tablet 1 Package 5    Sig: Take 1 tablet by mouth daily.     OB/GYN:  Contraceptives Passed - 10/19/2018 11:53 AM      Passed - Last BP in normal range    BP Readings from Last 1 Encounters:  09/01/18 120/80         Passed - Valid encounter within last 12 months    Recent Outpatient Visits          1 month ago Viral URI with cough   [place] at [place], [place]   3 months ago Attention deficit hyperactivity disorder (ADHD), unspecified ADHD type   Therapist, music at [place], MD   5 months ago Eczema, unspecified type   Therapist, [facility] at [facility], Standley Brooking, MD   6 months ago Visit for preventive health examination   Therapist, [facility] at [facility], Standley Brooking, MD   8 months ago Acute sinusitis, recurrence not specified, unspecified location   [facility] at [facility], Standley Brooking, MD      Future Appointments            In 1 week Panosh, Standley Brooking, MD [place] at [place], [place]          metFORMIN (GLUCOPHAGE-XR) 500 MG 24 hr tablet 90 tablet 0    Sig: Take 1 tablet (500 mg total) by mouth daily with breakfast.     Endocrinology:  Diabetes - Biguanides Passed - 10/19/2018 11:53 AM      Passed - Cr in normal range and within 360 days    Creat  Date Value Ref Range Status  11/16/2017 0.83 0.50 - 1.10 mg/dL Final   Creatinine, Ser  Date Value Ref Range Status  04/02/2018 0.90 0.40 - 1.20 mg/dL Final  Passed - HBA1C is between 0 and 7.9 and within 180 days    Hgb A1c MFr Bld  Date Value Ref Range Status  08/02/2018 6.2 4.6 - 6.5 % Final    Comment:    Glycemic Control Guidelines for People with Diabetes:Non Diabetic:  <6%Goal of Therapy: <7%Additional Action Suggested:  >8%          Passed - eGFR in normal range and within 360 days    GFR calc Af Amer  Date Value Ref Range Status  03/07/2016 >60 >60 mL/min Final    Comment:    (NOTE) The eGFR has been calculated using the CKD EPI equation. This calculation has not been validated in all clinical situations. eGFR's persistently <60 mL/min signify possible Chronic Kidney Disease.    GFR calc non Af Amer  Date Value Ref Range Status  03/07/2016 >60 >60 mL/min Final   GFR  Date Value Ref Range Status  04/02/2018 97.72 >60.00 mL/min Final         Passed - Valid encounter within last 6 months    Recent Outpatient Visits          1 month ago Viral URI with cough   [place] at [place], MD   3 months ago Attention deficit hyperactivity disorder (ADHD), unspecified ADHD type   Therapist, music at [place], MD   5 months ago Eczema, unspecified type   Therapist, [facility] at [facility], Standley Brooking, MD   6 months ago Visit for preventive health examination   Therapist, music at [facility], Standley Brooking, MD   8 months ago Acute sinusitis, recurrence not specified, unspecified location   [facility] at [facility], Standley Brooking, MD      Future Appointments            In 1 week Panosh, Standley Brooking, MD [facility] at [place], [facility]

## 2018-10-21 MED ORDER — METHYLPHENIDATE HCL ER (OSM) 36 MG PO TBCR: EXTENDED_RELEASE_TABLET | ORAL | 0 refills | Status: DC

## 2018-10-21 NOTE — Telephone Encounter (Signed)
Please advise. Kerrin Champagne., RMA  Last OV:07/01/18 Last refill : 08/20/18

## 2018-10-31 NOTE — Progress Notes (Signed)
Chief Complaint  Patient presents with  . Follow-up    HPI: Glenda Mendez 28 y.o. come in for Chronic disease management   High alk phos low vit d   Nl ggt  Took rx vit d and Vit d  Taking  otc  And  bg elevated   On metformin since dec and   metfromin and makess stomach " hurt like crap"   adhd :  Taking med with nhelp as usual Derm stable  Working 20-30 hours per week. Hearing  Updated testing Asthma stable at this time .  ROS: See pertinent positives and negatives per HPI.  Past Medical History:  Diagnosis Date  . ADHD (attention deficit hyperactivity disorder)   . Allergy   . Asthma   . BMI (body mass index), pediatric, 95-99% for age 52/10/2010   Is starting to gain weight again and advised and counseled today about reduction for health risk reasons. Patient is very well aware of how to do lifestyle intervention she has done this before.   Marland Kitchen Hearing loss in right ear    with hearing aid bilateral   . Hemiparesis ([place])    rt from neonatal period  . Hypoxia of newborn   . Irregular periods 06/04/2011  . PCOS (polycystic ovarian syndrome)   . Pulmonary hemorrhage of fetus or newborn    63 week C-section [facility] of with birth hypoxia    Family History  Adopted: Yes  Problem Relation Age of Onset  . Hypertension Unknown   . Schizophrenia Mother        with alcohol drug abuse  . Diabetes Other     Social History   Socioeconomic History  . Marital status: Single    Spouse name: Not on file  . Number of children: Not on file  . Years of education: Not on file  . Highest education level: Not on file  Occupational History  . Not on file  Social Needs  . Financial resource strain: Not on file  . Food insecurity:    Worry: Not on file    Inability: Not on file  . Transportation needs:    Medical: Not on file    Non-medical: Not on file  Tobacco Use  . Smoking status: Never Smoker  . Smokeless tobacco: Never Used  Substance and Sexual Activity  . Alcohol use:  No    Alcohol/week: 0.0 standard drinks  . Drug use: No  . Sexual activity: Never    Birth control/protection: Pill  Lifestyle  . Physical activity:    Days per week: Not on file    Minutes per session: Not on file  . Stress: Not on file  Relationships  . Social connections:    Talks on phone: Not on file    Gets together: Not on file    Attends religious service: Not on file    Active member of club or organization: Not on file    Attends meetings of clubs or organizations: Not on file    Relationship status: Not on file  Other Topics Concern  . 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 [name]- pulm at [facility] now has a new pulmonary Dr. Now stable and on prn  Thyroid evaluation in the past neck evaluation within normal limits and no thyroid disease.    Outpatient Medications Prior to Visit  Medication Sig Dispense Refill  . albuterol (PROVENTIL HFA;VENTOLIN HFA) 108 (90 Base) MCG/ACT inhaler Inhale 2 puffs into the lungs every 6 (six) hours as needed for wheezing or shortness of breath. 1 Inhaler 4  . albuterol (PROVENTIL) (2.5 MG/3ML) 0.083% nebulizer solution Take 3 mLs (2.5 mg total) by nebulization every 4 (four) hours as needed for wheezing or shortness of breath. 75 mL 2  . betamethasone dipropionate (DIPROLENE) 0.05 % cream Apply topically 2 (two) times daily.    Marland Kitchen desonide (DESOWEN) 0.05 % cream Apply topically 2 (two) times daily.    . diclofenac (VOLTAREN) 75 MG EC tablet Take 1 tablet (75 mg total) by mouth 2 (two) times daily. Take 1 tab bid X 10 days then as needed 30 tablet 0  . fluticasone (FLONASE) 50 MCG/ACT nasal spray Place 2 sprays into both nostrils daily. 16 g 6  . fluticasone-salmeterol (ADVAIR HFA) 230-21 MCG/ACT inhaler Inhale 2 puffs into the lungs 2 (two) times  daily. 1 Inhaler 5  . hydrOXYzine (ATARAX/VISTARIL) 10 MG tablet Take 1 tablet (10 mg total) by mouth daily as needed for itching. 30 tablet 0  . ipratropium (ATROVENT) 0.02 % nebulizer solution USE 1 VIAL BY NEBULIZATION 3 (THREE) TIMES DAILY AS NEEDED FOR WHEEZING. **DUE FOR ANNUAL VISIT June 2019** 62.5 mL 0  . levocetirizine (XYZAL ALLERGY 24HR) 5 MG tablet Take 1 tablet (5 mg total) by mouth every evening. 90 tablet 3  . metFORMIN (GLUCOPHAGE-XR) 500 MG 24 hr tablet Take 1 tablet (500 mg total) by mouth daily with breakfast. 90 tablet 0  . methylphenidate 36 MG PO CR tablet TAKE (2) TABLETS BY MOUTH DAILY. 60 tablet 0  . Naproxen-Esomeprazole (VIMOVO) 500-20 MG TBEC Take 1 tablet by mouth 2 (two) times daily. 60 tablet 2  . norgestimate-ethinyl estradiol (ORTHO-CYCLEN, 28,) 0.25-35 MG-MCG tablet Take 1 tablet by mouth daily. 1 Package 5  . omeprazole (PRILOSEC) 20 MG capsule Take 1 capsule (20 mg total) by mouth 2 (two) times daily before a meal. 60 capsule 3  . pantoprazole (PROTONIX) 40 MG tablet Take 1 tablet (40 mg total) by mouth daily. 30 tablet 0  . predniSONE (DELTASONE) 10 MG tablet 46m x1 day 366mx1 day 2019m1 day 20m79m day 5mg 28mday STOP 20 tablet 0  . Vitamin D, Ergocalciferol, (DRISDOL) 1.25 MG (50000 UT) CAPS capsule Take 1 capsule (50,000 Units total) by mouth every 7 (seven) days. 8 capsule 0   No facility-administered medications prior to visit.      EXAM:  BP 122/74 (BP Location: Right Arm, Patient Position: Sitting, Cuff Size: Normal)   Pulse 85   Temp 98.6 F (37 C) (Oral)   Wt 284 lb 1.6 oz (128.9 kg)   LMP 10/20/2018 (Exact Date)   BMI 44.50 kg/m   Body mass index is 44.5 kg/m.  GENERAL: vitals reviewed and listed above, alert, oriented, appears well hydrated and in no acute distress HEENT: atraumatic, conjunctiva  clear, no obvious abnormalities on inspection of external nose and ears  NECK: no obvious masses on inspection palpation  LUNGS: clear to  auscultation bilaterally, no wheezes, rales or rhonchi, good air movement abd no masses  CV: HRRR, no clubbing cyanosis or  peripheral edema nl cap refill  MS: moves all extremities without noticeable focal  abnormality PSYCH: pleasant and cooperative, no obvious depression or  anxiety Lab Results  Component Value Date   WBC 5.6 04/02/2018   HGB 11.7 (L) 04/02/2018   HCT 37.3 04/02/2018   PLT 382.0 04/02/2018   GLUCOSE 87 04/02/2018   CHOL 149 04/02/2018   TRIG 60.0 04/02/2018   HDL 39.10 04/02/2018   LDLCALC 98 04/02/2018   ALT 25 04/02/2018   AST 22 04/02/2018   NA 141 04/02/2018   K 4.4 04/02/2018   CL 104 04/02/2018   CREATININE 0.90 04/02/2018   BUN 10 04/02/2018   CO2 28 04/02/2018   TSH 1.82 04/02/2018   HGBA1C 6.2 08/02/2018   BP Readings from Last 3 Encounters:  11/01/18 122/74  09/01/18 120/80  08/04/18 132/72   Wt Readings from Last 3 Encounters:  11/01/18 284 lb 1.6 oz (128.9 kg)  09/01/18 286 lb (129.7 kg)  08/04/18 285 lb (129.3 kg)    ASSESSMENT AND PLAN:  Discussed the following assessment and plan:  Attention deficit hyperactivity disorder (ADHD), unspecified ADHD type - Plan: Hemoglobin B7J, Basic metabolic panel, Hepatic function panel, VITAMIN D 25 Hydroxy (Vit-D Deficiency, Fractures)  Medication management - Plan: Hemoglobin I9C, Basic metabolic panel, Hepatic function panel, VITAMIN D 25 Hydroxy (Vit-D Deficiency, Fractures)  Elevated hemoglobin A1c - insulineresistance - Plan: Hemoglobin V8L, Basic metabolic panel, Hepatic function panel, VITAMIN D 25 Hydroxy (Vit-D Deficiency, Fractures)  Alkaline phosphatase elevation - poss from vit d def? minor elevaetd  nl GGT - Plan: Hemoglobin F8B, Basic metabolic panel, Hepatic function panel, VITAMIN D 25 Hydroxy (Vit-D Deficiency, Fractures)  Vitamin D deficiency - Plan: Hemoglobin O1B, Basic metabolic panel, Hepatic function panel, VITAMIN D 25 Hydroxy (Vit-D Deficiency, Fractures)  Medication side  effect - continue med and try taking  at largest meal   Risk benefit of medication discussed.  Benefit more than risk of medications  to continue.  -Patient advised to return or notify health care team  if  new concerns arise.  Patient Instructions  Try taking the Metformin with the largest meal i.e. dinner and give it time to see if they stomach symptoms resolve. This medicine is good for insulin resistance and prediabetes.  Continue taking over-the-counter vitamin D thousand units or so per day. Plan lab work in March to follow-up vitamin D blood sugar etc. If all okay plan our OV in 6 months for CPX in June.  Standley Brooking. Panosh M.D.

## 2018-11-01 ENCOUNTER — Ambulatory Visit (INDEPENDENT_AMBULATORY_CARE_PROVIDER_SITE_OTHER): Payer: Federal, State, Local not specified - PPO | Admitting: Internal Medicine

## 2018-11-01 ENCOUNTER — Encounter: Payer: Self-pay | Admitting: Internal Medicine

## 2018-11-01 VITALS — BP 122/74 | HR 85 | Temp 98.6°F | Wt 284.1 lb

## 2018-11-01 DIAGNOSIS — R748 Abnormal levels of other serum enzymes: Secondary | ICD-10-CM | POA: Diagnosis not present

## 2018-11-01 DIAGNOSIS — E559 Vitamin D deficiency, unspecified: Secondary | ICD-10-CM

## 2018-11-01 DIAGNOSIS — T887XXA Unspecified adverse effect of drug or medicament, initial encounter: Secondary | ICD-10-CM

## 2018-11-01 DIAGNOSIS — R7309 Other abnormal glucose: Secondary | ICD-10-CM | POA: Diagnosis not present

## 2018-11-01 DIAGNOSIS — Z79899 Other long term (current) drug therapy: Secondary | ICD-10-CM | POA: Diagnosis not present

## 2018-11-01 DIAGNOSIS — F909 Attention-deficit hyperactivity disorder, unspecified type: Secondary | ICD-10-CM | POA: Diagnosis not present

## 2018-11-01 NOTE — Patient Instructions (Signed)
Try taking the Metformin with the largest meal i.e. dinner and give it time to see if they stomach symptoms resolve. This medicine is good for insulin resistance and prediabetes.  Continue taking over-the-counter vitamin D thousand units or so per day. Plan lab work in March to follow-up vitamin D blood sugar etc. If all okay plan our OV in 6 months for CPX in June.

## 2019-01-18 ENCOUNTER — Encounter: Payer: Self-pay | Admitting: Sports Medicine

## 2019-01-18 ENCOUNTER — Ambulatory Visit (INDEPENDENT_AMBULATORY_CARE_PROVIDER_SITE_OTHER): Payer: BLUE CROSS/BLUE SHIELD | Admitting: Sports Medicine

## 2019-01-18 ENCOUNTER — Ambulatory Visit: Payer: Federal, State, Local not specified - PPO | Admitting: Sports Medicine

## 2019-01-18 ENCOUNTER — Telehealth: Payer: Self-pay | Admitting: Internal Medicine

## 2019-01-18 VITALS — BP 115/90 | Ht 67.0 in | Wt 289.2 lb

## 2019-01-18 DIAGNOSIS — M545 Low back pain, unspecified: Secondary | ICD-10-CM

## 2019-01-18 MED ORDER — IBUPROFEN 800 MG PO TABS: 800.0000 mg | ORAL_TABLET | Freq: Three times a day (TID) | ORAL | 1 refills | Status: DC | PRN

## 2019-01-18 MED ORDER — METHOCARBAMOL 500 MG PO TABS: 500.0000 mg | ORAL_TABLET | Freq: Three times a day (TID) | ORAL | 1 refills | Status: DC | PRN

## 2019-01-18 NOTE — Telephone Encounter (Unsigned)
Copied from [place] [phone]. Topic: Quick Communication - Rx Refill/Question >> Jan 18, 2019  3:45 PM Celene Kras A wrote: Medication: methylphenidate 36 MG PO CR tablet [[id number]]   Has the patient contacted their pharmacy? No. (Agent: If no, request that the patient contact the pharmacy for the refill.) (Agent: If yes, when and what did the pharmacy advise?)  Preferred Pharmacy (with phone number or street name): [place], [place] [address] Phone: [phone] Fax: [phone] Not a 24 hour pharmacy; exact hours not known.    Agent: Please be advised that RX refills may take up to 3 business days. We ask that you follow-up with your pharmacy.

## 2019-01-18 NOTE — Patient Instructions (Signed)

## 2019-01-18 NOTE — Progress Notes (Signed)
Glenda Mendez. Glenda Mendez, [facility] [phone]  Glenda Mendez - 28 y.o. female MRN [id number]  Date of birth: 21-Aug-1991  Visit Date: 01/18/2019  PCP: Burnis Medin, MD   Referred by: Burnis Medin, MD   Virtual Visit via Video (WebEx)  I connected with Glenda Mendez on 01/18/19 at  2:40 PM EDT by a video enabled telemedicine application and verified that I am speaking with the correct person using two identifiers. Location patient: Home Location provider: Provider office Persons participating in the virtual visit: Patient, Provider, Patient's mother  I discussed the limitations of evaluation and management by telemedicine and the availability of in person appointments. The patient expressed understanding and agreed to proceed.   SUBJECTIVE:   Chief Complaint  Patient presents with  . Follow-up    Low back pain    HPI: Patient reports having a fall 2 weeks ago while getting out of bed.  She reportedly felt weak but had no syncopal symptoms.  Ultimately she landed on the right aspect of her buttock and had a jarring sensation.  She denies any radicular symptoms but does have some tightness in the ankle that she previously had surgery on.  Overall she does continue to have some soreness and tightness over the last 2 weeks.  She has been trying to take some occasional Aleve and had some leftover Robaxin that provided moderate but incomplete relief.  It is worse with going from a bent over position to standing.  Pain does seem to be axial in nature without radicular component.  She denies any changes in bowel or bladder.  REVIEW OF SYSTEMS: Otherwise Per HPI  HISTORY:  Prior history reviewed and updated per electronic medical record.  Patient Active Problem List   Diagnosis Date Noted  . Impingement of left ankle joint 12/02/2017  . Left ankle pain 06/23/2017    Ankle Injected 06/23/17 MRI 08/06/17 -small amount of joint  effusion without significant internal derangement however there is a small posterior ganglion versus entrapped tibiotalar joint fluid.   Directly injected posterior fluid on 09/06/2017 with ultrasound guidance   . Ganglion cyst of flexor tendon sheath of finger of right hand 02/02/2017  . Trigger finger, right middle finger 02/02/2017  . Eczema 07/11/2016  . Dyspnea and respiratory abnormality 05/08/2016  . Elevated testosterone level in female 08/28/2015  . Elevated hemoglobin A1c 08/28/2015  . Abdominal pain, right upper quadrant 06/05/2015    ed x 2 see text   . Abnormal urine 06/05/2015  . Alkaline phosphatase elevation 04/27/2015    Mildly up rest of LFTs normal will follow   . Severe obesity (BMI >= 40) ([place]) 12/19/2014    rerefer to nutrition labs before next visit    . Asthma with acute exacerbation 08/29/2014    rescue q6 hours for now and pred 5 day burst . fu here or pum if not improved   . Moderate intermittent asthma, uncomplicated 35/32/9924  . Rash, skin 06/20/2014    Axillary looks monilial versus steroid related add topical Antifungal  yeast keep dry follow up if persistent   . Moderate persistent asthma with acute exacerbation in adult 05/27/2014  . Anterior chest wall pain 05/27/2014  . Low grade squamous intraepithelial lesion (LGSIL) on cervical Pap smear 12/02/2013    2 2015  Plan repeat in 12 months by protocol.   . Obesity (BMI 30-39.9) 11/28/2013    Counseled lsi contact us for  nutrition referral if wishes    . Visit for preventive health examination 11/28/2013  . Encounter for routine gynecological examination 11/28/2013  . Weight gain 11/28/2013    disc  monitoring healthy ;  had lost wieght in hs w lsi . consider weight watcher contact us if wishes nutrition referral    . Asthma 11/28/2013    currently stable  cld at birth Followed currently [facility] pulm   Last pfts were normal in April 2015    . Moderate persistent asthma without complication  41/66/0630    CLD of prematurity   . Hemiparesis ([place])     rt from neonatal period   . Oral contraceptive pill surveillance 04/25/2012  . Headache 06/04/2011    Possibly hormonally related.   . Irregular periods 06/04/2011  . Acne 06/04/2011    Mild to moderate on face   . BCP (birth control pills) initiation 06/04/2011    irregular periods and acne not currently for contraception   . Preventative health care 02/18/2011  . Neoplasm of uncertain behavior of skin 02/18/2011  . BMI (body mass index), pediatric, 95-99% for age 38/10/2010    Is starting to gain weight again and advised and counseled today about reduction for health risk reasons. Patient is very well aware of how to do lifestyle intervention she has done this before.   . Lumbar back pain 09/17/2010    Qualifier: Diagnosis of  By: Hulan Saas, CMA (AAMA), [place]a Skye    . NECK PAIN 01/02/2009    Qualifier: Diagnosis of  By: Regis Bill MD, Standley Brooking    . FATIGUE 12/06/2008    Qualifier: Diagnosis of  By: Regis Bill MD, Standley Brooking    . Attention deficit hyperactivity disorder (ADHD) 09/03/2007    Qualifier: Diagnosis of  By: Regis Bill MD, Standley Brooking    . UNSPECIFIED HEARING LOSS 09/03/2007    Qualifier: Diagnosis of  By: Regis Bill MD, Standley Brooking    . ALLERGIC RHINITIS 05/19/2007    Qualifier: Diagnosis of  By: Regis Bill MD, Standley Brooking    . ASTHMA 05/19/2007    Qualifier: Diagnosis of  By: Regis Bill MD, Standley Brooking     Social History   Occupational History  . Not on file  Tobacco Use  . Smoking status: Never Smoker  . Smokeless tobacco: Never Used  Substance and Sexual Activity  . Alcohol use: No    Alcohol/week: 0.0 standard drinks  . Drug use: No  . Sexual activity: Never    Birth control/protection: Pill   Social History   Social History Narrative   Adopted and raised by family member [name]   [employer] high school  graduate [employer] second semester   Living at home with mom and has no car   Sleep 8+ hours    Seeing Dr. [name]- pulm at [facility] now has a new pulmonary Dr. Now stable and on prn       Thyroid evaluation in the past neck evaluation within normal limits and no thyroid disease.    OBJECTIVE:  VS:  HT:5\' 7"  (170.2 cm)   WT:289 lb 3.2 oz (131.2 kg)(Taken at pt's home during WebEx visit)  BMI:45.28    BP:115/90(Taken at pt's home during WebEx visit)  HR: bpm  TEMP: ( )  RESP:    PHYSICAL EXAM: GENERAL: Alert, appears well and in no acute distress. HEENT: Atraumatic, conjunctiva clear, no obvious abnormalities on inspection of external nose and ears. NECK: Normal movements of the head and neck. CARDIOPULMONARY: No increased  WOB. Speaking in clear sentences. I:E ratio WNL.  MS: Moves all visible extremities without noticeable abnormality. PSYCH: Pleasant and cooperative, well-groomed. Speech normal rate and rhythm. Affect is appropriate. Insight and judgement are appropriate. Attention is focused, linear, and appropriate.  NEURO: CN grossly intact. Oriented as arrived to appointment on time with no prompting. Moves both UE equally.  SKIN: No obvious lesions, wounds, erythema, or cyanosis noted on face or hands.  She does have some pain with going from standing to bent over but this is mild.  She is able to go from sitting on her bed to   ASSESSMENT:   1. Lumbar back pain     PROCEDURES:  None  PLAN:  Pertinent additional documentation may be included in corresponding procedure notes, imaging studies, problem based documentation and patient instructions.  No problem-specific Assessment & Plan notes found for this encounter.   Seems to be mechanical low back pain.  She does have some issues with going from bent over to standing this is likely consistent with psoas syndrome.  We will give her anti-inflammatories muscle relaxers and home exercise program  [employer] Training Program: These exercises were developed by Minerva Ends, DC with a strong emphasis on core neuromuscular  reducation and postural realignment through body-weight exercises. Daily practice encouarged and links to [employer] provided today per Patient Instructions.     Activity modifications and the importance of avoiding exacerbating activities (limiting pain to no more than a 4 / 10 during or following activity) recommended and discussed.   Discussed red flag symptoms that warrant earlier emergent evaluation and patient voices understanding.   Meds ordered this encounter  Medications  . methocarbamol (ROBAXIN) 500 MG tablet    Sig: Take 1 tablet (500 mg total) by mouth every 8 (eight) hours as needed for muscle spasms.    Dispense:  60 tablet    Refill:  1  . ibuprofen (ADVIL,MOTRIN) 800 MG tablet    Sig: Take 1 tablet (800 mg total) by mouth every 8 (eight) hours as needed.    Dispense:  60 tablet    Refill:  1   Lab Orders  No laboratory test(s) ordered today   Imaging Orders  No imaging studies ordered today   Referral Orders  No referral(s) requested today    Return in about 2 weeks (around 02/01/2019), or if symptoms worsen or fail to improve.  She will call if any lack of improvement.         Gerda Diss, [facility] Physician

## 2019-01-19 MED ORDER — METHYLPHENIDATE HCL ER (OSM) 36 MG PO TBCR: EXTENDED_RELEASE_TABLET | ORAL | 0 refills | Status: AC

## 2019-01-19 NOTE — Telephone Encounter (Signed)
Sent in electronically .  

## 2019-01-26 ENCOUNTER — Emergency Department (HOSPITAL_COMMUNITY)
Admission: EM | Admit: 2019-01-26 | Discharge: 2019-01-26 | Disposition: A | Discharge status: Home / Self Care | Payer: BLUE CROSS/BLUE SHIELD | Attending: Emergency Medicine | Admitting: Emergency Medicine

## 2019-01-26 ENCOUNTER — Encounter (HOSPITAL_COMMUNITY): Payer: Self-pay

## 2019-01-26 ENCOUNTER — Other Ambulatory Visit: Payer: Self-pay

## 2019-01-26 DIAGNOSIS — F909 Attention-deficit hyperactivity disorder, unspecified type: Secondary | ICD-10-CM | POA: Insufficient documentation

## 2019-01-26 DIAGNOSIS — Z79899 Other long term (current) drug therapy: Secondary | ICD-10-CM | POA: Diagnosis not present

## 2019-01-26 DIAGNOSIS — J45909 Unspecified asthma, uncomplicated: Secondary | ICD-10-CM | POA: Insufficient documentation

## 2019-01-26 DIAGNOSIS — L03011 Cellulitis of right finger: Secondary | ICD-10-CM | POA: Diagnosis not present

## 2019-01-26 MED ORDER — DOXYCYCLINE HYCLATE 100 MG PO TABS
100.0000 mg | ORAL_TABLET | Freq: Once | ORAL | Status: AC
  Administered 2019-01-26: 100 mg via ORAL
  Filled 2019-01-26: qty 1

## 2019-01-26 MED ORDER — DOXYCYCLINE HYCLATE 100 MG PO CAPS: 100.0000 mg | ORAL_CAPSULE | Freq: Two times a day (BID) | ORAL | 0 refills | Status: AC

## 2019-01-26 NOTE — ED Provider Notes (Signed)
[facility] EMERGENCY DEPARTMENT Provider Note   CSN: [id number] Arrival date & time: 01/26/19  1733    History   Chief Complaint No chief complaint on file.   HPI Glenda Mendez is a 28 y.o. female with history of ADHD, allergies, obesity presents for evaluation of acute onset, progressively worsening right fourth digit pain beginning 5 days ago.  She reports that last Friday she struck her hand on an unknown object and thinks that she may have sustained a superficial abrasion overlying the dorsum of her right fourth PIP joint.  2 days later she noticed some erythema and swelling to the area which have since worsened.  She notes a constant aching throbbing pain to the joint which worsens with bending the finger and palpation.  Also notes numbness to the ulnar aspect of the right hand.  She denies any fevers, nausea, vomiting, or diaphoresis.  She has been applying a topical antibiotic ointment without relief.  Per chart review, her tetanus is up-to-date.  She is right-hand dominant.     The history is provided by the patient.    Past Medical History:  Diagnosis Date  . ADHD (attention deficit hyperactivity disorder)   . Allergy   . Asthma   . BMI (body mass index), pediatric, 95-99% for age 33/10/2010   Is starting to gain weight again and advised and counseled today about reduction for health risk reasons. Patient is very well aware of how to do lifestyle intervention she has done this before.   Marland Kitchen Hearing loss in right ear    with hearing aid bilateral   . Hemiparesis ([place])    rt from neonatal period  . Hypoxia of newborn   . Irregular periods 06/04/2011  . PCOS (polycystic ovarian syndrome)   . Pulmonary hemorrhage of fetus or newborn    47 week C-section [facility] of with birth hypoxia    Patient Active Problem List   Diagnosis Date Noted  . Impingement of left ankle joint 12/02/2017  . Left ankle pain 06/23/2017  . Ganglion cyst of flexor tendon sheath of  finger of right hand 02/02/2017  . Trigger finger, right middle finger 02/02/2017  . Eczema 07/11/2016  . Dyspnea and respiratory abnormality 05/08/2016  . Elevated testosterone level in female 08/28/2015  . Elevated hemoglobin A1c 08/28/2015  . Abdominal pain, right upper quadrant 06/05/2015  . Abnormal urine 06/05/2015  . Alkaline phosphatase elevation 04/27/2015  . Severe obesity (BMI >= 40) ([place]) 12/19/2014  . Asthma with acute exacerbation 08/29/2014  . Moderate intermittent asthma, uncomplicated 41/93/7902  . Rash, skin 06/20/2014  . Moderate persistent asthma with acute exacerbation in adult 05/27/2014  . Anterior chest wall pain 05/27/2014  . Low grade squamous intraepithelial lesion (LGSIL) on cervical Pap smear 12/02/2013  . Obesity (BMI 30-39.9) 11/28/2013  . Visit for preventive health examination 11/28/2013  . Encounter for routine gynecological examination 11/28/2013  . Weight gain 11/28/2013  . Asthma 11/28/2013  . Moderate persistent asthma without complication 40/97/3532  . Hemiparesis ([place])   . Oral contraceptive pill surveillance 04/25/2012  . Headache 06/04/2011  . Irregular periods 06/04/2011  . Acne 06/04/2011  . BCP (birth control pills) initiation 06/04/2011  . Preventative health care 02/18/2011  . Neoplasm of uncertain behavior of skin 02/18/2011  . BMI (body mass index), pediatric, 95-99% for age 33/10/2010  . Lumbar back pain 09/17/2010  . NECK PAIN 01/02/2009  . FATIGUE 12/06/2008  . Attention deficit hyperactivity disorder (ADHD) 09/03/2007  .  UNSPECIFIED HEARING LOSS 09/03/2007  . ALLERGIC RHINITIS 05/19/2007  . ASTHMA 05/19/2007    Past Surgical History:  Procedure Laterality Date  . TONSILLECTOMY       OB History   No obstetric history on file.      Home Medications    Prior to Admission medications   Medication Sig Start Date End Date Taking? Authorizing Provider  albuterol (PROVENTIL HFA;VENTOLIN HFA) 108 (90 Base) MCG/ACT  inhaler Inhale 2 puffs into the lungs every 6 (six) hours as needed for wheezing or shortness of breath. 08/04/18   Brand Males, MD  albuterol (PROVENTIL) (2.5 MG/3ML) 0.083% nebulizer solution Take 3 mLs (2.5 mg total) by nebulization every 4 (four) hours as needed for wheezing or shortness of breath. 08/04/18   Brand Males, MD  betamethasone dipropionate (DIPROLENE) 0.05 % cream Apply topically 2 (two) times daily.    [provider]  desonide (DESOWEN) 0.05 % cream Apply topically 2 (two) times daily.    [provider]  diclofenac (VOLTAREN) 75 MG EC tablet Take 1 tablet (75 mg total) by mouth 2 (two) times daily. Take 1 tab bid X 10 days then as needed Patient not taking: Reported on 01/18/2019 11/16/17   Gerda Diss, DO  doxycycline (VIBRAMYCIN) 100 MG capsule Take 1 capsule (100 mg total) by mouth 2 (two) times daily for 5 days. 01/26/19 01/31/19  Rodell Perna A, PA-C  fluticasone (FLONASE) 50 MCG/ACT nasal spray Place 2 sprays into both nostrils daily. 02/18/17   Nafziger, [name], NP  fluticasone-salmeterol (ADVAIR HFA) 230-21 MCG/ACT inhaler Inhale 2 puffs into the lungs 2 (two) times daily. 08/31/18   Brand Males, MD  hydrOXYzine (ATARAX/VISTARIL) 10 MG tablet Take 1 tablet (10 mg total) by mouth daily as needed for itching. 08/12/16   Rigoberto Noel, MD  ibuprofen (ADVIL,MOTRIN) 800 MG tablet Take 1 tablet (800 mg total) by mouth every 8 (eight) hours as needed. 01/18/19   Teresa Coombs D, DO  ipratropium (ATROVENT) 0.02 % nebulizer solution USE 1 VIAL BY NEBULIZATION 3 (THREE) TIMES DAILY AS NEEDED FOR WHEEZING. **DUE FOR ANNUAL VISIT June 2019** 03/03/18   Panosh, Standley Brooking, MD  levocetirizine (XYZAL ALLERGY 24HR) 5 MG tablet Take 1 tablet (5 mg total) by mouth every evening. 02/18/17   Nafziger, [name], NP  metFORMIN (GLUCOPHAGE-XR) 500 MG 24 hr tablet Take 1 tablet (500 mg total) by mouth daily with breakfast. 10/19/18 10/19/19  Panosh, Standley Brooking, MD  methocarbamol  (ROBAXIN) 500 MG tablet Take 1 tablet (500 mg total) by mouth every 8 (eight) hours as needed for muscle spasms. 01/18/19   Gerda Diss, DO  methylphenidate 36 MG PO CR tablet TAKE (2) TABLETS BY MOUTH DAILY. 01/19/19   Panosh, Standley Brooking, MD  Naproxen-Esomeprazole (VIMOVO) 500-20 MG TBEC Take 1 tablet by mouth 2 (two) times daily. Patient not taking: Reported on 01/18/2019 07/23/17   Gerda Diss, DO  norgestimate-ethinyl estradiol (ORTHO-CYCLEN, 28,) 0.25-35 MG-MCG tablet Take 1 tablet by mouth daily. 10/19/18   Panosh, Standley Brooking, MD  omeprazole (PRILOSEC) 20 MG capsule Take 1 capsule (20 mg total) by mouth 2 (two) times daily before a meal. 06/20/14   Panosh, Standley Brooking, MD  pantoprazole (PROTONIX) 40 MG tablet Take 1 tablet (40 mg total) by mouth daily. 11/26/16   Panosh, Standley Brooking, MD  Vitamin D, Ergocalciferol, (DRISDOL) 1.25 MG (50000 UT) CAPS capsule Take 1 capsule (50,000 Units total) by mouth every 7 (seven) days. 08/25/18   Panosh, Standley Brooking,  MD    Family History Family History  Adopted: Yes  Problem Relation Age of Onset  . Hypertension Unknown   . Schizophrenia Mother        with alcohol drug abuse  . Diabetes Other     Social History Social History   Tobacco Use  . Smoking status: Never Smoker  . Smokeless tobacco: Never Used  Substance Use Topics  . Alcohol use: No    Alcohol/week: 0.0 standard drinks  . Drug use: No     Allergies   Patient has no known allergies.   Review of Systems Review of Systems  Constitutional: Negative for fever.  Musculoskeletal: Positive for arthralgias.  Skin: Positive for color change.  Neurological: Positive for numbness. Negative for weakness.     Physical Exam Updated Vital Signs BP 120/88 (BP Location: Right Arm)   Pulse 88   Temp 98.2 F (36.8 C) (Oral)   Resp 18   Ht 5\' 7"  (1.702 m)   Wt 127 kg   SpO2 100%   BMI 43.85 kg/m   Physical Exam Vitals signs and nursing note reviewed.  Constitutional:      General: She is not  in acute distress.    Appearance: She is well-developed.  HENT:     Head: Normocephalic and atraumatic.  Eyes:     General:        Right eye: No discharge.        Left eye: No discharge.     Conjunctiva/sclera: Conjunctivae normal.  Neck:     Vascular: No JVD.     Trachea: No tracheal deviation.  Cardiovascular:     Rate and Rhythm: Normal rate.     Pulses: Normal pulses.     Comments: 2+ radial pulses bilaterally Pulmonary:     Effort: Pulmonary effort is normal.  Abdominal:     General: There is no distension.  Musculoskeletal:        General: Swelling and tenderness present.     Comments: See below images.  Patient with erythema and swelling to the dorsum of the right fourth PIP joint with a central area of purulence.  No active drainage.  Decreased range of motion of the joint with flexion, 5/5 strength of wrist and digits with flexion extension against resistance.  Swelling is not circumferential.  No pain with passive extension.  No crepitus.  Skin:    General: Skin is warm and dry.     Findings: Erythema present.  Neurological:     Mental Status: She is alert.     Comments: Fluent speech, no facial droop, sensation intact to soft touch of bilateral upper extremities with subjective paresthesias of the ulnar aspect of the right hand.  Good grip strength bilaterally.  5/5 strength of BUE major muscle groups  Psychiatric:        Behavior: Behavior normal.          ED Treatments / Results  Labs (all labs ordered are listed, but only abnormal results are displayed) Labs Reviewed - No data to display  EKG None  Radiology No results found.  Procedures .Marland KitchenIncision and Drainage Date/Time: 01/26/2019 6:28 PM Performed by: Renita Papa, PA-C Authorized by: Renita Papa, PA-C   Consent:    Consent obtained:  Verbal   Consent given by:  Patient   Risks discussed:  Bleeding, incomplete drainage, pain and damage to other organs   Alternatives discussed:  No  treatment Universal protocol:    Procedure explained and  questions answered to patient or proxy's satisfaction: yes     Relevant documents present and verified: yes     Test results available and properly labeled: yes     Imaging studies available: yes     Required blood products, implants, devices, and special equipment available: yes     Site/side marked: yes     Immediately prior to procedure a time out was called: yes     Patient identity confirmed:  Verbally with patient Location:    Type:  Abscess   Location:  Upper extremity   Upper extremity location:  Finger   Finger location:  R ring finger Pre-procedure details:    Skin preparation:  Chloraprep Anesthesia (see MAR for exact dosages):    Anesthesia method:  None Procedure type:    Complexity:  Complex Procedure details:    Needle aspiration: yes     Needle size:  18 G   Incision depth:  Dermal   Drainage:  Purulent   Drainage amount:  Scant   Wound treatment:  Wound left open Post-procedure details:    Patient tolerance of procedure:  Tolerated well, no immediate complications   (including critical care time)  Medications Ordered in ED Medications  doxycycline (VIBRA-TABS) tablet 100 mg (has no administration in time range)     Initial Impression / Assessment and Plan / ED Course  I have reviewed the triage vital signs and the nursing notes.  Pertinent labs & imaging results that were available during my care of the patient were reviewed by me and considered in my medical decision making (see chart for details).        Patient presenting for evaluation of abscess and surrounding cellulitis of the dorsum of the right fourth PIP joint.  She is afebrile, vital signs are stable.  She is nontoxic in appearance.  No constitutional symptoms.  She decreased range of motion but no fusiform swelling.  Low suspicion of osteomyelitis, septic arthritis, or flexor tenosynovitis.  I was able to drain a small amount of  purulence using an 18-gauge needle after obtaining consent from the patient.  Will discharge with course of doxycycline.  Recommend follow-up with hand surgeon for reevaluation of symptoms.  She is a patient of Dr. Jess Barters and will follow up with their office.  Discussed strict ED return precautions. Pt verbalized understanding of and agreement with plan and is safe for discharge home at this time.  Discussed with Dr. Regenia Skeeter who agrees with assessment and plan at this time.  Final Clinical Impressions(s) / ED Diagnoses   Final diagnoses:  Cellulitis of right ring finger    ED Discharge Orders         Ordered    doxycycline (VIBRAMYCIN) 100 MG capsule  2 times daily     01/26/19 1835           Renita Papa, PA-C 01/26/19 1836    Sherwood Gambler, MD 01/28/19 [phone]

## 2019-01-26 NOTE — ED Triage Notes (Signed)
Hit finger/scraped on Friday of last week, put some ointment and wrapped. Sunday finger became swollen, put essential oil on it, now is swollen and red with white spot in middle, finger is numb

## 2019-01-26 NOTE — Discharge Instructions (Signed)
1. Medications: Please take all of your antibiotics until finished!   You may develop abdominal discomfort or diarrhea from the antibiotic.  You may help offset this with probiotics which you can buy or get in yogurt. Do not eat  or take the probiotics until 2 hours after your antibiotic.   Alternate 600 mg of ibuprofen and [phone] mg of Tylenol every 3-4 hours as needed for pain. Do not exceed 4000 mg of Tylenol daily.  Take ibuprofen with food to avoid upset stomach issues.  2. Treatment: ice for swelling, keep wound clean with warm soap and water and keep bandage dry, do not submerge in water for 24 hours 3. Follow Up: Please return in 2 days for wound check.  Alternatively, you can follow-up with a hand specialist at your orthopedist office within 2 days.  Please return sooner if any concerning signs or symptoms develop such as high fevers, redness, drainage of pus from the wound, streaking of redness, or swelling around the entire finger   WOUND CARE  Keep area clean and dry for 24 hours. Do not remove bandage, if applied.  After 24 hours, remove bandage and wash wound gently with mild soap and warm water. Reapply a new bandage after cleaning wound, if directed.   Continue daily cleansing with soap and water until stitches/staples are removed.  Do not apply any ointments or creams to the wound while stitches/staples are in place, as this may cause delayed healing. Return if you experience any of the following signs of infection: Swelling, redness, pus drainage, streaking, fever >101.0 F  Return if you experience excessive bleeding that does not stop after 15-20 minutes of constant, firm pressure.

## 2019-02-04 ENCOUNTER — Other Ambulatory Visit: Payer: Self-pay

## 2019-02-04 ENCOUNTER — Emergency Department (HOSPITAL_COMMUNITY)
Admission: EM | Admit: 2019-02-04 | Discharge: 2019-02-04 | Disposition: A | Discharge status: Home / Self Care | Payer: BLUE CROSS/BLUE SHIELD | Attending: Emergency Medicine | Admitting: Emergency Medicine

## 2019-02-04 ENCOUNTER — Encounter (HOSPITAL_COMMUNITY): Payer: Self-pay

## 2019-02-04 DIAGNOSIS — F909 Attention-deficit hyperactivity disorder, unspecified type: Secondary | ICD-10-CM | POA: Insufficient documentation

## 2019-02-04 DIAGNOSIS — R062 Wheezing: Secondary | ICD-10-CM | POA: Diagnosis not present

## 2019-02-04 DIAGNOSIS — R05 Cough: Secondary | ICD-10-CM | POA: Diagnosis not present

## 2019-02-04 DIAGNOSIS — J45909 Unspecified asthma, uncomplicated: Secondary | ICD-10-CM | POA: Diagnosis not present

## 2019-02-04 DIAGNOSIS — J45901 Unspecified asthma with (acute) exacerbation: Secondary | ICD-10-CM

## 2019-02-04 DIAGNOSIS — R079 Chest pain, unspecified: Secondary | ICD-10-CM | POA: Diagnosis not present

## 2019-02-04 DIAGNOSIS — R0602 Shortness of breath: Secondary | ICD-10-CM | POA: Diagnosis not present

## 2019-02-04 DIAGNOSIS — Z79899 Other long term (current) drug therapy: Secondary | ICD-10-CM | POA: Insufficient documentation

## 2019-02-04 DIAGNOSIS — R0902 Hypoxemia: Secondary | ICD-10-CM | POA: Diagnosis not present

## 2019-02-04 MED ORDER — MAGNESIUM SULFATE 2 GM/50ML IV SOLN
2.0000 g | Freq: Once | INTRAVENOUS | Status: AC
  Administered 2019-02-04: 2 g via INTRAVENOUS
  Filled 2019-02-04: qty 50

## 2019-02-04 MED ORDER — PREDNISONE 20 MG PO TABS: 60.0000 mg | ORAL_TABLET | Freq: Every day | ORAL | 0 refills | Status: DC

## 2019-02-04 MED ORDER — ALBUTEROL SULFATE HFA 108 (90 BASE) MCG/ACT IN AERS: 4.0000 | INHALATION_SPRAY | RESPIRATORY_TRACT | Status: DC | PRN

## 2019-02-04 MED ORDER — METHYLPREDNISOLONE SODIUM SUCC 125 MG IJ SOLR
125.0000 mg | Freq: Once | INTRAMUSCULAR | Status: AC
  Administered 2019-02-04: 125 mg via INTRAVENOUS
  Filled 2019-02-04: qty 2

## 2019-02-04 NOTE — Discharge Instructions (Addendum)
You are seen in the emergency department for an exacerbation of your asthma.  We are sending you home with a prescription for prednisone for 4 more days.  Continue to use your inhaler as directed.  Stay well-hydrated.  Follow-up with your doctor and return if any worsening symptoms.

## 2019-02-04 NOTE — Progress Notes (Signed)
RT NOTE:  Peak expiratory flow was performed. Pt is able to get to 350.

## 2019-02-04 NOTE — ED Notes (Signed)
Patient verbalizes understanding of discharge instructions. Opportunity for questioning and answers were provided. Pt discharged from ED. 

## 2019-02-04 NOTE — ED Triage Notes (Addendum)
Pt from home via ems; At 0300 this am pt woke with sob (hx asthma), did a neb tx at home at 0300 and at 040 ; minimal relief; sat 93%,94% RA; 100% w/ NRB; pt also c/o chest wall pain; CBG 132, HR 70-76;pt warm and dry; 98.53F temporal; denies sick contacts, denies COVID-19 contacts

## 2019-02-04 NOTE — ED Provider Notes (Signed)
[facility] EMERGENCY DEPARTMENT Provider Note   CSN: [id number] Arrival date & time: 02/04/19  0719    History   Chief Complaint Chief Complaint  Patient presents with  . Shortness of Breath    HPI Glenda Mendez is a 28 y.o. female.  She has a history of asthma.  She is complaining of acute shortness of breath that woke her up around 4 AM this morning.  She tried nebulizer treatments without any improvement.  She is feeling tightness in her chest.  No fevers no sore throat or runny nose.  Does not smoke.  She said this feeling is typical of her asthma exacerbations although she has not had one this year.     The history is provided by the patient.  Shortness of Breath  Severity:  Severe Onset quality:  Sudden Timing:  Constant Progression:  Unchanged Chronicity:  Recurrent Context: not smoke exposure   Relieved by:  Nothing Worsened by:  Deep breathing and activity Ineffective treatments:  Inhaler and oxygen Associated symptoms: cough and wheezing   Associated symptoms: no abdominal pain, no chest pain, no fever, no headaches, no neck pain, no rash, no sore throat, no sputum production and no vomiting   Risk factors: no tobacco use     Past Medical History:  Diagnosis Date  . ADHD (attention deficit hyperactivity disorder)   . Allergy   . Asthma   . BMI (body mass index), pediatric, 95-99% for age 17/10/2010   Is starting to gain weight again and advised and counseled today about reduction for health risk reasons. Patient is very well aware of how to do lifestyle intervention she has done this before.   Marland Kitchen Hearing loss in right ear    with hearing aid bilateral   . Hemiparesis ([place])    rt from neonatal period  . Hypoxia of newborn   . Irregular periods 06/04/2011  . PCOS (polycystic ovarian syndrome)   . Pulmonary hemorrhage of fetus or newborn    75 week C-section [facility] of with birth hypoxia    Patient Active Problem List   Diagnosis Date Noted   . Impingement of left ankle joint 12/02/2017  . Left ankle pain 06/23/2017  . Ganglion cyst of flexor tendon sheath of finger of right hand 02/02/2017  . Trigger finger, right middle finger 02/02/2017  . Eczema 07/11/2016  . Dyspnea and respiratory abnormality 05/08/2016  . Elevated testosterone level in female 08/28/2015  . Elevated hemoglobin A1c 08/28/2015  . Abdominal pain, right upper quadrant 06/05/2015  . Abnormal urine 06/05/2015  . Alkaline phosphatase elevation 04/27/2015  . Severe obesity (BMI >= 40) ([place]) 12/19/2014  . Asthma with acute exacerbation 08/29/2014  . Moderate intermittent asthma, uncomplicated 16/38/4665  . Rash, skin 06/20/2014  . Moderate persistent asthma with acute exacerbation in adult 05/27/2014  . Anterior chest wall pain 05/27/2014  . Low grade squamous intraepithelial lesion (LGSIL) on cervical Pap smear 12/02/2013  . Obesity (BMI 30-39.9) 11/28/2013  . Visit for preventive health examination 11/28/2013  . Encounter for routine gynecological examination 11/28/2013  . Weight gain 11/28/2013  . Asthma 11/28/2013  . Moderate persistent asthma without complication 99/35/7017  . Hemiparesis ([place])   . Oral contraceptive pill surveillance 04/25/2012  . Headache 06/04/2011  . Irregular periods 06/04/2011  . Acne 06/04/2011  . BCP (birth control pills) initiation 06/04/2011  . Preventative health care 02/18/2011  . Neoplasm of uncertain behavior of skin 02/18/2011  . BMI (body mass index), pediatric,  95-99% for age 32/10/2010  . Lumbar back pain 09/17/2010  . NECK PAIN 01/02/2009  . FATIGUE 12/06/2008  . Attention deficit hyperactivity disorder (ADHD) 09/03/2007  . UNSPECIFIED HEARING LOSS 09/03/2007  . ALLERGIC RHINITIS 05/19/2007  . ASTHMA 05/19/2007    Past Surgical History:  Procedure Laterality Date  . TONSILLECTOMY       OB History   No obstetric history on file.      Home Medications    Prior to Admission medications    Medication Sig Start Date End Date Taking? Authorizing Provider  albuterol (PROVENTIL HFA;VENTOLIN HFA) 108 (90 Base) MCG/ACT inhaler Inhale 2 puffs into the lungs every 6 (six) hours as needed for wheezing or shortness of breath. 08/04/18   Brand Males, MD  albuterol (PROVENTIL) (2.5 MG/3ML) 0.083% nebulizer solution Take 3 mLs (2.5 mg total) by nebulization every 4 (four) hours as needed for wheezing or shortness of breath. 08/04/18   Brand Males, MD  betamethasone dipropionate (DIPROLENE) 0.05 % cream Apply topically 2 (two) times daily.    [provider]  desonide (DESOWEN) 0.05 % cream Apply topically 2 (two) times daily.    [provider]  diclofenac (VOLTAREN) 75 MG EC tablet Take 1 tablet (75 mg total) by mouth 2 (two) times daily. Take 1 tab bid X 10 days then as needed Patient not taking: Reported on 01/18/2019 11/16/17   Gerda Diss, DO  fluticasone [facility]) 50 MCG/ACT nasal spray Place 2 sprays into both nostrils daily. 02/18/17   Nafziger, [name], NP  fluticasone-salmeterol (ADVAIR HFA) 230-21 MCG/ACT inhaler Inhale 2 puffs into the lungs 2 (two) times daily. 08/31/18   Brand Males, MD  hydrOXYzine (ATARAX/VISTARIL) 10 MG tablet Take 1 tablet (10 mg total) by mouth daily as needed for itching. 08/12/16   Rigoberto Noel, MD  ibuprofen (ADVIL,MOTRIN) 800 MG tablet Take 1 tablet (800 mg total) by mouth every 8 (eight) hours as needed. 01/18/19   Teresa Coombs D, DO  ipratropium (ATROVENT) 0.02 % nebulizer solution USE 1 VIAL BY NEBULIZATION 3 (THREE) TIMES DAILY AS NEEDED FOR WHEEZING. **DUE FOR ANNUAL VISIT June 2019** 03/03/18   Panosh, Standley Brooking, MD  levocetirizine (XYZAL ALLERGY 24HR) 5 MG tablet Take 1 tablet (5 mg total) by mouth every evening. 02/18/17   Nafziger, [name], NP  metFORMIN (GLUCOPHAGE-XR) 500 MG 24 hr tablet Take 1 tablet (500 mg total) by mouth daily with breakfast. 10/19/18 10/19/19  Panosh, Standley Brooking, MD  methocarbamol (ROBAXIN) 500 MG  tablet Take 1 tablet (500 mg total) by mouth every 8 (eight) hours as needed for muscle spasms. 01/18/19   Gerda Diss, DO  methylphenidate 36 MG PO CR tablet TAKE (2) TABLETS BY MOUTH DAILY. 01/19/19   Panosh, Standley Brooking, MD  Naproxen-Esomeprazole (VIMOVO) 500-20 MG TBEC Take 1 tablet by mouth 2 (two) times daily. Patient not taking: Reported on 01/18/2019 07/23/17   Gerda Diss, DO  norgestimate-ethinyl estradiol (ORTHO-CYCLEN, 28,) 0.25-35 MG-MCG tablet Take 1 tablet by mouth daily. 10/19/18   Panosh, Standley Brooking, MD  omeprazole (PRILOSEC) 20 MG capsule Take 1 capsule (20 mg total) by mouth 2 (two) times daily before a meal. 06/20/14   Panosh, Standley Brooking, MD  pantoprazole (PROTONIX) 40 MG tablet Take 1 tablet (40 mg total) by mouth daily. 11/26/16   Panosh, Standley Brooking, MD  Vitamin D, Ergocalciferol, (DRISDOL) 1.25 MG (50000 UT) CAPS capsule Take 1 capsule (50,000 Units total) by mouth every 7 (seven) days. 08/25/18   Panosh, Mariann Laster  K, MD    Family History Family History  Adopted: Yes  Problem Relation Age of Onset  . Hypertension Unknown   . Schizophrenia Mother        with alcohol drug abuse  . Diabetes Other     Social History Social History   Tobacco Use  . Smoking status: Never Smoker  . Smokeless tobacco: Never Used  Substance Use Topics  . Alcohol use: No    Alcohol/week: 0.0 standard drinks  . Drug use: No     Allergies   Patient has no known allergies.   Review of Systems Review of Systems  Constitutional: Negative for fever.  HENT: Negative for sore throat.   Eyes: Negative for visual disturbance.  Respiratory: Positive for cough, shortness of breath and wheezing. Negative for sputum production.   Cardiovascular: Negative for chest pain.  Gastrointestinal: Negative for abdominal pain and vomiting.  Genitourinary: Negative for dysuria.  Musculoskeletal: Negative for neck pain.  Skin: Negative for rash.  Neurological: Negative for headaches.     Physical Exam Updated  Vital Signs BP 111/64   Pulse 66   Temp 98 F (36.7 C) (Oral)   Resp 16   Ht 5\' 7"  (1.702 m)   Wt 127 kg   SpO2 99%   BMI 43.85 kg/m   Physical Exam Vitals signs and nursing note reviewed.  Constitutional:      General: She is not in acute distress.    Appearance: She is well-developed.  HENT:     Head: Normocephalic and atraumatic.  Eyes:     Conjunctiva/sclera: Conjunctivae normal.  Neck:     Musculoskeletal: Neck supple.  Cardiovascular:     Rate and Rhythm: Normal rate and regular rhythm.     Heart sounds: No murmur.  Pulmonary:     Effort: Pulmonary effort is normal. No respiratory distress.     Breath sounds: Decreased breath sounds and wheezing present.  Abdominal:     Palpations: Abdomen is soft.     Tenderness: There is no abdominal tenderness.  Musculoskeletal: Normal range of motion.     Right lower leg: She exhibits no tenderness. No edema.     Left lower leg: She exhibits no tenderness. No edema.  Skin:    General: Skin is warm and dry.     Capillary Refill: Capillary refill takes less than 2 seconds.  Neurological:     General: No focal deficit present.     Mental Status: She is alert and oriented to person, place, and time.      ED Treatments / Results  Labs (all labs ordered are listed, but only abnormal results are displayed) Labs Reviewed - No data to display  EKG None  Radiology No results found.  Procedures Procedures (including critical care time)  Medications Ordered in ED Medications  magnesium sulfate IVPB 2 g 50 mL (has no administration in time range)  methylPREDNISolone sodium succinate (SOLU-MEDROL) 125 mg/2 mL injection 125 mg (has no administration in time range)  albuterol (VENTOLIN HFA) 108 (90 Base) MCG/ACT inhaler 4 puff (has no administration in time range)     Initial Impression / Assessment and Plan / ED Course  I have reviewed the triage vital signs and the nursing notes.  Pertinent labs & imaging results that  were available during my care of the patient were reviewed by me and considered in my medical decision making (see chart for details).  Clinical Course as of Feb 03 1541  Fri Feb 04, 2019  5093 Differential diagnosis includes asthma, pneumonia, Covid, reflux, bronchitis   [MB]  0929 Reevaluated patient after magnesium and steroids.  She is moving better air and able to speak in full sentences.  Sats remained 98 to 100% on room air.   [MB]    Clinical Course User Index [MB] Hayden Rasmussen, MD       Final Clinical Impressions(s) / ED Diagnoses   Final diagnoses:  Moderate asthma with exacerbation, unspecified whether persistent    ED Discharge Orders         Ordered    predniSONE (DELTASONE) 20 MG tablet  Daily     02/04/19 0943           Hayden Rasmussen, MD 02/04/19 [phone]

## 2019-02-15 ENCOUNTER — Telehealth: Payer: Self-pay | Admitting: Internal Medicine

## 2019-02-15 MED ORDER — FLUCONAZOLE 150 MG PO TABS: 150.0000 mg | ORAL_TABLET | Freq: Once | ORAL | 2 refills | Status: AC

## 2019-02-15 NOTE — Telephone Encounter (Signed)
Med sent it

## 2019-02-15 NOTE — Telephone Encounter (Signed)
Pt is having itchiness and having thick mucus plugs that look like cottage cheese pt describes. Pt has been eating activa and it has helped some but itchiness is still there. Pt states she does not know what she has used in past to treat this since it has been so long

## 2019-02-15 NOTE — Telephone Encounter (Signed)
Copied from [place] [id number]. Topic: Quick Communication - Rx Refill/Question >> Feb 15, 2019 11:52 AM Alanda Slim E wrote: Medication: Pt would like something called into pharmacy to treat a yeast infection she has developed from taking predniSONE (DELTASONE) 20 MG tablet  Has the patient contacted their pharmacy? No   Preferred Pharmacy (with phone number or street name): [facility], [place]. [phone] (Phone) [phone] (Fax)    Agent: Please be advised that RX refills may take up to 3 business days. We ask that you follow-up with your pharmacy.

## 2019-02-15 NOTE — Telephone Encounter (Signed)
Please  Document more information about her symptoms  And what she has used before  Or for this   Episode .

## 2019-02-15 NOTE — Telephone Encounter (Signed)
She [place] otc monistat  3-7 days  Or we can call in .  Diflucan 150 disp 1  Take 1 po  Can refill on it x 1

## 2019-03-15 ENCOUNTER — Other Ambulatory Visit: Payer: Self-pay

## 2019-03-15 ENCOUNTER — Emergency Department (HOSPITAL_COMMUNITY)
Admission: EM | Admit: 2019-03-15 | Discharge: 2019-03-15 | Discharge status: Against Medical Advice | Payer: BLUE CROSS/BLUE SHIELD | Attending: Emergency Medicine | Admitting: Emergency Medicine

## 2019-03-15 ENCOUNTER — Encounter (HOSPITAL_COMMUNITY): Payer: Self-pay | Admitting: Emergency Medicine

## 2019-03-15 ENCOUNTER — Emergency Department (HOSPITAL_COMMUNITY): Payer: BLUE CROSS/BLUE SHIELD

## 2019-03-15 DIAGNOSIS — Z5321 Procedure and treatment not carried out due to patient leaving prior to being seen by health care provider: Secondary | ICD-10-CM | POA: Diagnosis not present

## 2019-03-15 DIAGNOSIS — R0602 Shortness of breath: Secondary | ICD-10-CM | POA: Diagnosis not present

## 2019-03-15 DIAGNOSIS — Z209 Contact with and (suspected) exposure to unspecified communicable disease: Secondary | ICD-10-CM | POA: Diagnosis not present

## 2019-03-15 DIAGNOSIS — R079 Chest pain, unspecified: Secondary | ICD-10-CM | POA: Diagnosis not present

## 2019-03-15 DIAGNOSIS — R0789 Other chest pain: Secondary | ICD-10-CM | POA: Diagnosis not present

## 2019-03-15 NOTE — ED Notes (Signed)
PT not in waiting room

## 2019-03-15 NOTE — ED Triage Notes (Signed)
Per EMS, pt from home, states she became very SOB at home accompanied w/ chest tightness, assuming it was an asthma attack she used her albuterol, no relief.  Pt states better when she sits up. Chest tightness and burning.

## 2019-03-15 NOTE — ED Notes (Signed)
Pt called for room, no answer.

## 2019-05-18 ENCOUNTER — Emergency Department (HOSPITAL_COMMUNITY)
Admission: EM | Admit: 2019-05-18 | Discharge: 2019-05-18 | Discharge status: Against Medical Advice | Payer: BC Managed Care – PPO | Attending: Emergency Medicine | Admitting: Emergency Medicine

## 2019-05-18 ENCOUNTER — Other Ambulatory Visit: Payer: Self-pay

## 2019-05-18 ENCOUNTER — Emergency Department (HOSPITAL_COMMUNITY): Payer: BC Managed Care – PPO

## 2019-05-18 DIAGNOSIS — Z5321 Procedure and treatment not carried out due to patient leaving prior to being seen by health care provider: Secondary | ICD-10-CM | POA: Insufficient documentation

## 2019-05-18 DIAGNOSIS — R0602 Shortness of breath: Secondary | ICD-10-CM | POA: Diagnosis present

## 2019-05-18 LAB — CBC
HCT: 38 % (ref 36.0–46.0)
Hemoglobin: 11.4 g/dL — ABNORMAL LOW (ref 12.0–15.0)
MCH: 21.4 pg — ABNORMAL LOW (ref 26.0–34.0)
MCHC: 30 g/dL (ref 30.0–36.0)
MCV: 71.3 fL — ABNORMAL LOW (ref 80.0–100.0)
Platelets: 452 10*3/uL — ABNORMAL HIGH (ref 150–400)
RBC: 5.33 MIL/uL — ABNORMAL HIGH (ref 3.87–5.11)
RDW: 16.7 % — ABNORMAL HIGH (ref 11.5–15.5)
WBC: 7.7 10*3/uL (ref 4.0–10.5)
nRBC: 0 % (ref 0.0–0.2)

## 2019-05-18 LAB — BASIC METABOLIC PANEL
Anion gap: 10 (ref 5–15)
BUN: 7 mg/dL (ref 6–20)
CO2: 20 mmol/L — ABNORMAL LOW (ref 22–32)
Calcium: 9 mg/dL (ref 8.9–10.3)
Chloride: 108 mmol/L (ref 98–111)
Creatinine, Ser: 1.07 mg/dL — ABNORMAL HIGH (ref 0.44–1.00)
GFR calc Af Amer: >60 mL/min (ref >60)
GFR calc non Af Amer: >60 mL/min (ref >60)
Glucose, Bld: 103 mg/dL — ABNORMAL HIGH (ref 70–99)
Potassium: 3.5 mmol/L (ref 3.5–5.1)
Sodium: 138 mmol/L (ref 135–145)

## 2019-05-18 LAB — I-STAT BETA HCG BLOOD, ED (MC, WL, AP ONLY): I-stat hCG, quantitative: <5 m[IU]/mL (ref <5)

## 2019-05-18 NOTE — ED Notes (Signed)
Patient told registration she was leaving

## 2019-05-18 NOTE — ED Triage Notes (Signed)
Pt c/o increased shortness of breath yesterday afternoon about 12pm. Used multiple neb tx (albuterol with neb) with no relief and also used rescue inhaler. Sats @ 100% in triage, NAD noted.

## 2019-06-22 ENCOUNTER — Encounter (HOSPITAL_COMMUNITY): Payer: Self-pay | Admitting: Emergency Medicine

## 2019-06-22 ENCOUNTER — Ambulatory Visit: Payer: Self-pay | Admitting: *Deleted

## 2019-06-22 ENCOUNTER — Emergency Department (HOSPITAL_COMMUNITY)
Admission: EM | Admit: 2019-06-22 | Discharge: 2019-06-22 | Disposition: A | Discharge status: Home / Self Care | Payer: BC Managed Care – PPO | Attending: Emergency Medicine | Admitting: Emergency Medicine

## 2019-06-22 DIAGNOSIS — Z79899 Other long term (current) drug therapy: Secondary | ICD-10-CM | POA: Insufficient documentation

## 2019-06-22 DIAGNOSIS — J45901 Unspecified asthma with (acute) exacerbation: Secondary | ICD-10-CM | POA: Insufficient documentation

## 2019-06-22 DIAGNOSIS — Z7984 Long term (current) use of oral hypoglycemic drugs: Secondary | ICD-10-CM | POA: Diagnosis not present

## 2019-06-22 MED ORDER — ALBUTEROL SULFATE (2.5 MG/3ML) 0.083% IN NEBU: 2.5000 mg | INHALATION_SOLUTION | RESPIRATORY_TRACT | 2 refills | Status: AC | PRN

## 2019-06-22 MED ORDER — ALBUTEROL SULFATE HFA 108 (90 BASE) MCG/ACT IN AERS: 2.0000 | INHALATION_SPRAY | Freq: Four times a day (QID) | RESPIRATORY_TRACT | 3 refills | Status: AC | PRN

## 2019-06-22 MED ORDER — IPRATROPIUM BROMIDE 0.02 % IN SOLN: RESPIRATORY_TRACT | 2 refills | Status: AC

## 2019-06-22 NOTE — Telephone Encounter (Signed)
Pt called with complaints of chest tightness and SOB for the past 2 days; she has been using her albuterol and advair inhaler; her SOB is constant; her chest tightness is intermittent; the pt says that she has nasal congestion; recommendations made per nurse triage protocol; the pt verbalized understanding, but still wants to see Dr Regis Bill, [facility] today; pt transferred to [facility] for scheduling.  Reason for Disposition . [1] MODERATE difficulty breathing (e.g., speaks in phrases, SOB even at rest, pulse 100-120) AND [2] NEW-onset or WORSE than normal  Answer Assessment - Initial Assessment Questions 1. RESPIRATORY STATUS: "Describe your breathing?" (e.g., wheezing, shortness of breath, unable to speak, severe coughing)      SOB 2. ONSET: "When did this breathing problem begin?"     06/20/2019 3. PATTERN "Does the difficult breathing come and go, or has it been constant since it started?"   cosntant 4. SEVERITY: "How bad is your breathing?" (e.g., mild, moderate, severe)    - MILD: No SOB at rest, mild SOB with walking, speaks normally in sentences, can lay down, no retractions, pulse < 100.    - MODERATE: SOB at rest, SOB with minimal exertion and prefers to sit, cannot lie down flat, speaks in phrases, mild retractions, audible wheezing, pulse 100-120.    - SEVERE: Very SOB at rest, speaks in single words, struggling to breathe, sitting hunched forward, retractions, pulse > 120      moderate 5. RECURRENT SYMPTOM: "Have you had difficulty breathing before?" If so, ask: "When was the last time?" and "What happened that time?"      Yes, especially when weather changes 6. CARDIAC HISTORY: "Do you have any history of heart disease?" (e.g., heart attack, angina, bypass surgery, angioplasty)      "heart-lung bypass" 7. LUNG HISTORY: "Do you have any history of lung disease?"  (e.g., pulmonary embolus, asthma, emphysema)     asthma 8. CAUSE: "What do you think is causing the breathing problem?"      Not sure 9. OTHER SYMPTOMS: "Do you have any other symptoms? (e.g., dizziness, runny nose, cough, chest pain, fever)    Chest tightness, congestion 10. PREGNANCY: "Is there any chance you are pregnant?" "When was your last menstrual period?"       *no LMP 06/13/2019 11. TRAVEL: "Have you traveled out of the country in the last month?" (e.g., travel history, exposures)  Protocols used: BREATHING DIFFICULTY-A-AH

## 2019-06-22 NOTE — Telephone Encounter (Signed)
Spoke with Tammy and she states NT said they had advised pt to seek evaluation in ED. Tammy says when she spoke with pt she reiterated this recommendation. Pt finally agreed and states she will go to ED today. Pt advised she can contact office for ED follow up once she has been discharged.   Will monitor chart for ED arrival.

## 2019-06-22 NOTE — ED Triage Notes (Signed)
Pt reports she has been having a tightness in her chest for the last 2 days-pt states she has asthma and this feels like her asthma. Pt has been using her home inhalers. Pt denies any cough or fevers. Pt is in no distress.

## 2019-06-22 NOTE — Discharge Planning (Signed)
EDCM notes pt multiple ED visits.  EDCM reviewed chart, pt has PCP (Panosh) of which she is in constant communication. EDCM to follow for disposition needs.

## 2019-06-22 NOTE — Telephone Encounter (Signed)
Pt in [facility] ED for evaluation.  

## 2019-06-22 NOTE — Discharge Instructions (Addendum)
Please read attached information. If you experience any new or worsening signs or symptoms please return to the emergency room for evaluation. Please follow-up with your primary care provider or specialist as discussed. Please use medication prescribed only as directed and discontinue taking if you have any concerning signs or symptoms.   °

## 2019-06-22 NOTE — ED Provider Notes (Signed)
[place] EMERGENCY DEPARTMENT Provider Note   CSN: [id number] Arrival date & time: 06/22/19  0940     History   Chief Complaint Chief Complaint  Patient presents with   Asthma    HPI Glenda Mendez is a 28 y.o. female.     HPI   28 year old female presents today with complaints of asthma exacerbation.  Patient notes over the last 2 days with the weather change she has had a flare of her asthma.  She notes tightness in her chest that improves with albuterol at home.  She denies any rhinorrhea nasal congestion cough fever or severe shortness of breath.  She notes this is identical to previous asthma exacerbations.  She denies any close sick contacts.  She notes she does have a inhaler at home but has run out of her nebulized medicine.  Past Medical History:  Diagnosis Date   ADHD (attention deficit hyperactivity disorder)    Allergy    Asthma    BMI (body mass index), pediatric, 95-99% for age 38/10/2010   Is starting to gain weight again and advised and counseled today about reduction for health risk reasons. Patient is very well aware of how to do lifestyle intervention she has done this before.    Hearing loss in right ear    with hearing aid bilateral    Hemiparesis (HCC)    rt from neonatal period   Hypoxia of newborn    Irregular periods 06/04/2011   PCOS (polycystic ovarian syndrome)    Pulmonary hemorrhage of fetus or newborn    15 week C-section [facility] of with birth hypoxia    Patient Active Problem List   Diagnosis Date Noted   Impingement of left ankle joint 12/02/2017   Left ankle pain 06/23/2017   Ganglion cyst of flexor tendon sheath of finger of right hand 02/02/2017   Trigger finger, right middle finger 02/02/2017   Eczema 07/11/2016   Dyspnea and respiratory abnormality 05/08/2016   Elevated testosterone level in female 08/28/2015   Elevated hemoglobin A1c 08/28/2015   Abdominal pain, right upper quadrant 06/05/2015    Abnormal urine 06/05/2015   Alkaline phosphatase elevation 04/27/2015   Severe obesity (BMI >= 40) (HCC) 12/19/2014   Asthma with acute exacerbation 08/29/2014   Moderate intermittent asthma, uncomplicated 99991111   Rash, skin 06/20/2014   Moderate persistent asthma with acute exacerbation in adult 05/27/2014   Anterior chest wall pain 05/27/2014   Low grade squamous intraepithelial lesion (LGSIL) on cervical Pap smear 12/02/2013   Obesity (BMI 30-39.9) 11/28/2013   Visit for preventive health examination 11/28/2013   Encounter for routine gynecological examination 11/28/2013   Weight gain 11/28/2013   Asthma 11/28/2013   Moderate persistent asthma without complication 123456   Hemiparesis ([place])    Oral contraceptive pill surveillance 04/25/2012   Headache 06/04/2011   Irregular periods 06/04/2011   Acne 06/04/2011   BCP (birth control pills) initiation 06/04/2011   Preventative health care 02/18/2011   Neoplasm of uncertain behavior of skin 02/18/2011   BMI (body mass index), pediatric, 95-99% for age 08/21/2011   Lumbar back pain 09/17/2010   NECK PAIN 01/02/2009   FATIGUE 12/06/2008   Attention deficit hyperactivity disorder (ADHD) 09/03/2007   UNSPECIFIED HEARING LOSS 09/03/2007   ALLERGIC RHINITIS 05/19/2007   ASTHMA 05/19/2007    Past Surgical History:  Procedure Laterality Date   TONSILLECTOMY       OB History   No obstetric history on file.  Home Medications    Prior to Admission medications   Medication Sig Start Date End Date Taking? Authorizing Provider  albuterol (PROVENTIL) (2.5 MG/3ML) 0.083% nebulizer solution Take 3 mLs (2.5 mg total) by nebulization every 4 (four) hours as needed for wheezing or shortness of breath. 06/22/19   Inola Lisle, Dellis Filbert, PA-C  albuterol (VENTOLIN HFA) 108 (90 Base) MCG/ACT inhaler Inhale 2 puffs into the lungs every 6 (six) hours as needed for wheezing or shortness of breath. 06/22/19    Zacarias Krauter, Dellis Filbert, PA-C  betamethasone dipropionate (DIPROLENE) 0.05 % cream Apply topically 2 (two) times daily.    [provider]  desonide (DESOWEN) 0.05 % cream Apply topically 2 (two) times daily.    [provider]  diclofenac (VOLTAREN) 75 MG EC tablet Take 1 tablet (75 mg total) by mouth 2 (two) times daily. Take 1 tab bid X 10 days then as needed Patient not taking: Reported on 01/18/2019 11/16/17   Gerda Diss, DO  fluticasone [facility]) 50 MCG/ACT nasal spray Place 2 sprays into both nostrils daily. 02/18/17   Nafziger, [name], NP  fluticasone-salmeterol (ADVAIR HFA) 230-21 MCG/ACT inhaler Inhale 2 puffs into the lungs 2 (two) times daily. 08/31/18   Brand Males, MD  hydrOXYzine (ATARAX/VISTARIL) 10 MG tablet Take 1 tablet (10 mg total) by mouth daily as needed for itching. 08/12/16   Rigoberto Noel, MD  ibuprofen (ADVIL,MOTRIN) 800 MG tablet Take 1 tablet (800 mg total) by mouth every 8 (eight) hours as needed. 01/18/19   Teresa Coombs D, DO  ipratropium (ATROVENT) 0.02 % nebulizer solution USE 1 VIAL BY NEBULIZATION 3 (THREE) TIMES DAILY AS NEEDED FOR WHEEZING. **DUE FOR ANNUAL VISIT June 2019** 06/22/19   Jarmarcus Wambold, Dellis Filbert, PA-C  levocetirizine (XYZAL ALLERGY 24HR) 5 MG tablet Take 1 tablet (5 mg total) by mouth every evening. 02/18/17   Nafziger, [name], NP  metFORMIN (GLUCOPHAGE-XR) 500 MG 24 hr tablet Take 1 tablet (500 mg total) by mouth daily with breakfast. 10/19/18 10/19/19  Panosh, Standley Brooking, MD  methocarbamol (ROBAXIN) 500 MG tablet Take 1 tablet (500 mg total) by mouth every 8 (eight) hours as needed for muscle spasms. 01/18/19   Gerda Diss, DO  methylphenidate 36 MG PO CR tablet TAKE (2) TABLETS BY MOUTH DAILY. 01/19/19   Panosh, Standley Brooking, MD  Naproxen-Esomeprazole (VIMOVO) 500-20 MG TBEC Take 1 tablet by mouth 2 (two) times daily. Patient not taking: Reported on 01/18/2019 07/23/17   Gerda Diss, DO  norgestimate-ethinyl estradiol (ORTHO-CYCLEN, 28,) 0.25-35  MG-MCG tablet Take 1 tablet by mouth daily. 10/19/18   Panosh, Standley Brooking, MD  omeprazole (PRILOSEC) 20 MG capsule Take 1 capsule (20 mg total) by mouth 2 (two) times daily before a meal. 06/20/14   Panosh, Standley Brooking, MD  pantoprazole (PROTONIX) 40 MG tablet Take 1 tablet (40 mg total) by mouth daily. 11/26/16   Panosh, Standley Brooking, MD  predniSONE (DELTASONE) 20 MG tablet Take 3 tablets (60 mg total) by mouth daily. 02/04/19   Hayden Rasmussen, MD  Vitamin D, Ergocalciferol, (DRISDOL) 1.25 MG (50000 UT) CAPS capsule Take 1 capsule (50,000 Units total) by mouth every 7 (seven) days. 08/25/18   Panosh, Standley Brooking, MD    Family History Family History  Adopted: Yes  Problem Relation Age of Onset   Hypertension Other    Schizophrenia Mother        with alcohol drug abuse   Diabetes Other     Social History Social History   Tobacco Use  Smoking status: Never Smoker   Smokeless tobacco: Never Used  Substance Use Topics   Alcohol use: No    Alcohol/week: 0.0 standard drinks   Drug use: No     Allergies   Patient has no known allergies.   Review of Systems Review of Systems  All other systems reviewed and are negative.   Physical Exam Updated Vital Signs BP 130/78 (BP Location: Right Arm)    Pulse 74    Temp 98.5 F (36.9 C) (Oral)    Resp 20    SpO2 100%   Physical Exam Vitals signs and nursing note reviewed.  Constitutional:      Appearance: She is well-developed.  HENT:     Head: Normocephalic and atraumatic.  Eyes:     General: No scleral icterus.       Right eye: No discharge.        Left eye: No discharge.     Conjunctiva/sclera: Conjunctivae normal.     Pupils: Pupils are equal, round, and reactive to light.  Neck:     Musculoskeletal: Normal range of motion.     Vascular: No JVD.     Trachea: No tracheal deviation.  Pulmonary:     Effort: Pulmonary effort is normal. No respiratory distress.     Breath sounds: Normal breath sounds. No stridor. No wheezing, rhonchi  or rales.  Neurological:     Mental Status: She is alert and oriented to person, place, and time.     Coordination: Coordination normal.  Psychiatric:        Behavior: Behavior normal.        Thought Content: Thought content normal.        Judgment: Judgment normal.      ED Treatments / Results  Labs (all labs ordered are listed, but only abnormal results are displayed) Labs Reviewed - No data to display  EKG None  Radiology No results found.  Procedures Procedures (including critical care time)  Medications Ordered in ED Medications - No data to display   Initial Impression / Assessment and Plan / ED Course  I have reviewed the triage vital signs and the nursing notes.  Pertinent labs & imaging results that were available during my care of the patient were reviewed by me and considered in my medical decision making (see chart for details).        28 year old female presents today with complaints of asthma exacerbation.  She feels this is from her weather.  She has no signs of infectious etiology low suspicion for COVID.  Discharged with symptomatic care and strict return precautions.  Verbalized understanding and agreement to today's plan had no further questions or concerns.  Final Clinical Impressions(s) / ED Diagnoses   Final diagnoses:  Mild asthma with exacerbation, unspecified whether persistent    ED Discharge Orders         Ordered    albuterol (VENTOLIN HFA) 108 (90 Base) MCG/ACT inhaler  Every 6 hours PRN    Note to Pharmacy: PLEASE GIVE CHEAPEST OPTION.   06/22/19 1038    albuterol (PROVENTIL) (2.5 MG/3ML) 0.083% nebulizer solution  Every 4 hours PRN     06/22/19 1038    ipratropium (ATROVENT) 0.02 % nebulizer solution     06/22/19 1038           Okey Regal, PA-C 06/22/19 1038    Gareth Morgan, MD 06/23/19 2308

## 2019-07-16 ENCOUNTER — Encounter (HOSPITAL_COMMUNITY): Payer: Self-pay | Admitting: *Deleted

## 2019-07-16 ENCOUNTER — Other Ambulatory Visit: Payer: Self-pay

## 2019-07-16 DIAGNOSIS — Z5321 Procedure and treatment not carried out due to patient leaving prior to being seen by health care provider: Secondary | ICD-10-CM | POA: Insufficient documentation

## 2019-07-16 NOTE — ED Triage Notes (Signed)
Pt ambulatory to triage with c/o a fall several days ago when she fell and slid down about 2-3 steps. Pain all through her back and the left buttocks. No numbness or tingling. No meds for the same.

## 2019-07-17 ENCOUNTER — Emergency Department (HOSPITAL_COMMUNITY)
Admission: EM | Admit: 2019-07-17 | Discharge: 2019-07-17 | Discharge status: Against Medical Advice | Payer: Self-pay | Attending: Emergency Medicine | Admitting: Emergency Medicine

## 2019-08-19 ENCOUNTER — Other Ambulatory Visit: Payer: Self-pay

## 2019-08-19 ENCOUNTER — Encounter (HOSPITAL_COMMUNITY): Payer: Self-pay

## 2019-08-19 ENCOUNTER — Emergency Department (HOSPITAL_COMMUNITY)
Admission: EM | Admit: 2019-08-19 | Discharge: 2019-08-19 | Disposition: A | Discharge status: Home / Self Care | Payer: Medicaid Other | Attending: Emergency Medicine | Admitting: Emergency Medicine

## 2019-08-19 DIAGNOSIS — J45909 Unspecified asthma, uncomplicated: Secondary | ICD-10-CM | POA: Insufficient documentation

## 2019-08-19 DIAGNOSIS — Z79899 Other long term (current) drug therapy: Secondary | ICD-10-CM | POA: Insufficient documentation

## 2019-08-19 DIAGNOSIS — H109 Unspecified conjunctivitis: Secondary | ICD-10-CM

## 2019-08-19 DIAGNOSIS — D485 Neoplasm of uncertain behavior of skin: Secondary | ICD-10-CM | POA: Insufficient documentation

## 2019-08-19 DIAGNOSIS — Z7984 Long term (current) use of oral hypoglycemic drugs: Secondary | ICD-10-CM | POA: Insufficient documentation

## 2019-08-19 MED ORDER — DIPHENHYDRAMINE HCL 25 MG PO CAPS
25.0000 mg | ORAL_CAPSULE | Freq: Once | ORAL | Status: AC
  Administered 2019-08-19: 22:00:00 25 mg via ORAL
  Filled 2019-08-19: qty 1

## 2019-08-19 NOTE — ED Notes (Signed)
Visual acuity test showed patient 20/40.

## 2019-08-19 NOTE — ED Triage Notes (Signed)
PT ENDORSES BIL EYE ITCHING, DRAINAGE (CLEAR), SWELLING FOR TODAY. DENIES INJURY. SLIGHT BLURRED VISION DUE TO CRUST

## 2019-08-19 NOTE — ED Provider Notes (Signed)
[facility] DEPT Provider Note   CSN: [id number] Arrival date & time: 08/19/19  2038     History   Chief Complaint Chief Complaint  Patient presents with  . Eye Pain    HPI Glenda Mendez is a 28 y.o. female.  Presents to the emergency department with right eye redness, itching.  Patient states symptoms started today, initially itching, now draining clear, also noted some swelling in her eyelids.  Denies any injuries, no trauma, no foreign body sensation.  No vision changes.  Does not wear contact lenses.  Denies prior history of conjunctivitis.       HPI  Past Medical History:  Diagnosis Date  . ADHD (attention deficit hyperactivity disorder)   . Allergy   . Asthma   . BMI (body mass index), pediatric, 95-99% for age 62/10/2010   Is starting to gain weight again and advised and counseled today about reduction for health risk reasons. Patient is very well aware of how to do lifestyle intervention she has done this before.   Marland Kitchen Hearing loss in right ear    with hearing aid bilateral   . Hemiparesis ([place])    rt from neonatal period  . Hypoxia of newborn   . Irregular periods 06/04/2011  . PCOS (polycystic ovarian syndrome)   . Pulmonary hemorrhage of fetus or newborn    70 week C-section [facility] of with birth hypoxia    Patient Active Problem List   Diagnosis Date Noted  . Impingement of left ankle joint 12/02/2017  . Left ankle pain 06/23/2017  . Ganglion cyst of flexor tendon sheath of finger of right hand 02/02/2017  . Trigger finger, right middle finger 02/02/2017  . Eczema 07/11/2016  . Dyspnea and respiratory abnormality 05/08/2016  . Elevated testosterone level in female 08/28/2015  . Elevated hemoglobin A1c 08/28/2015  . Abdominal pain, right upper quadrant 06/05/2015  . Abnormal urine 06/05/2015  . Alkaline phosphatase elevation 04/27/2015  . Severe obesity (BMI >= 40) ([place]) 12/19/2014  . Asthma with acute exacerbation 08/29/2014   . Moderate intermittent asthma, uncomplicated 99991111  . Rash, skin 06/20/2014  . Moderate persistent asthma with acute exacerbation in adult 05/27/2014  . Anterior chest wall pain 05/27/2014  . Low grade squamous intraepithelial lesion (LGSIL) on cervical Pap smear 12/02/2013  . Obesity (BMI 30-39.9) 11/28/2013  . Visit for preventive health examination 11/28/2013  . Encounter for routine gynecological examination 11/28/2013  . Weight gain 11/28/2013  . Asthma 11/28/2013  . Moderate persistent asthma without complication 123456  . Hemiparesis ([place])   . Oral contraceptive pill surveillance 04/25/2012  . Headache 06/04/2011  . Irregular periods 06/04/2011  . Acne 06/04/2011  . BCP (birth control pills) initiation 06/04/2011  . Preventative health care 02/18/2011  . Neoplasm of uncertain behavior of skin 02/18/2011  . BMI (body mass index), pediatric, 95-99% for age 58/10/2010  . Lumbar back pain 09/17/2010  . NECK PAIN 01/02/2009  . FATIGUE 12/06/2008  . Attention deficit hyperactivity disorder (ADHD) 09/03/2007  . UNSPECIFIED HEARING LOSS 09/03/2007  . ALLERGIC RHINITIS 05/19/2007  . ASTHMA 05/19/2007    Past Surgical History:  Procedure Laterality Date  . TONSILLECTOMY       OB History   No obstetric history on file.      Home Medications    Prior to Admission medications   Medication Sig Start Date End Date Taking? Authorizing Provider  albuterol (PROVENTIL) (2.5 MG/3ML) 0.083% nebulizer solution Take 3 mLs (2.5 mg total) by  nebulization every 4 (four) hours as needed for wheezing or shortness of breath. 06/22/19   Hedges, Dellis Filbert, PA-C  albuterol (VENTOLIN HFA) 108 (90 Base) MCG/ACT inhaler Inhale 2 puffs into the lungs every 6 (six) hours as needed for wheezing or shortness of breath. 06/22/19   Hedges, Dellis Filbert, PA-C  betamethasone dipropionate (DIPROLENE) 0.05 % cream Apply topically 2 (two) times daily.    [provider]  desonide (DESOWEN) 0.05 %  cream Apply topically 2 (two) times daily.    [provider]  diclofenac (VOLTAREN) 75 MG EC tablet Take 1 tablet (75 mg total) by mouth 2 (two) times daily. Take 1 tab bid X 10 days then as needed Patient not taking: Reported on 01/18/2019 11/16/17   Gerda Diss, DO  fluticasone [facility]) 50 MCG/ACT nasal spray Place 2 sprays into both nostrils daily. 02/18/17   Nafziger, [name], NP  fluticasone-salmeterol (ADVAIR HFA) 230-21 MCG/ACT inhaler Inhale 2 puffs into the lungs 2 (two) times daily. 08/31/18   Brand Males, MD  hydrOXYzine (ATARAX/VISTARIL) 10 MG tablet Take 1 tablet (10 mg total) by mouth daily as needed for itching. 08/12/16   Rigoberto Noel, MD  ibuprofen (ADVIL,MOTRIN) 800 MG tablet Take 1 tablet (800 mg total) by mouth every 8 (eight) hours as needed. 01/18/19   Teresa Coombs D, DO  ipratropium (ATROVENT) 0.02 % nebulizer solution USE 1 VIAL BY NEBULIZATION 3 (THREE) TIMES DAILY AS NEEDED FOR WHEEZING. **DUE FOR ANNUAL VISIT June 2019** 06/22/19   Hedges, Dellis Filbert, PA-C  levocetirizine (XYZAL ALLERGY 24HR) 5 MG tablet Take 1 tablet (5 mg total) by mouth every evening. 02/18/17   Nafziger, [name], NP  metFORMIN (GLUCOPHAGE-XR) 500 MG 24 hr tablet Take 1 tablet (500 mg total) by mouth daily with breakfast. 10/19/18 10/19/19  Panosh, Standley Brooking, MD  methocarbamol (ROBAXIN) 500 MG tablet Take 1 tablet (500 mg total) by mouth every 8 (eight) hours as needed for muscle spasms. 01/18/19   Gerda Diss, DO  methylphenidate 36 MG PO CR tablet TAKE (2) TABLETS BY MOUTH DAILY. 01/19/19   Panosh, Standley Brooking, MD  Naproxen-Esomeprazole (VIMOVO) 500-20 MG TBEC Take 1 tablet by mouth 2 (two) times daily. Patient not taking: Reported on 01/18/2019 07/23/17   Gerda Diss, DO  norgestimate-ethinyl estradiol (ORTHO-CYCLEN, 28,) 0.25-35 MG-MCG tablet Take 1 tablet by mouth daily. 10/19/18   Panosh, Standley Brooking, MD  omeprazole (PRILOSEC) 20 MG capsule Take 1 capsule (20 mg total) by mouth 2 (two) times  daily before a meal. 06/20/14   Panosh, Standley Brooking, MD  pantoprazole (PROTONIX) 40 MG tablet Take 1 tablet (40 mg total) by mouth daily. 11/26/16   Panosh, Standley Brooking, MD  predniSONE (DELTASONE) 20 MG tablet Take 3 tablets (60 mg total) by mouth daily. 02/04/19   Hayden Rasmussen, MD  Vitamin D, Ergocalciferol, (DRISDOL) 1.25 MG (50000 UT) CAPS capsule Take 1 capsule (50,000 Units total) by mouth every 7 (seven) days. 08/25/18   Panosh, Standley Brooking, MD    Family History Family History  Adopted: Yes  Problem Relation Age of Onset  . Hypertension Other   . Schizophrenia Mother        with alcohol drug abuse  . Diabetes Other     Social History Social History   Tobacco Use  . Smoking status: Never Smoker  . Smokeless tobacco: Never Used  Substance Use Topics  . Alcohol use: No    Alcohol/week: 0.0 standard drinks  . Drug use: No  Allergies   Patient has no known allergies.   Review of Systems Review of Systems  Constitutional: Negative for chills and fever.  HENT: Negative for ear pain and sore throat.   Eyes: Positive for pain and itching. Negative for visual disturbance.  Respiratory: Negative for cough and shortness of breath.   Cardiovascular: Negative for chest pain and palpitations.  Gastrointestinal: Negative for abdominal pain and vomiting.  Genitourinary: Negative for dysuria and hematuria.  Musculoskeletal: Negative for arthralgias and back pain.  Skin: Negative for color change and rash.  Neurological: Negative for seizures and syncope.  All other systems reviewed and are negative.    Physical Exam Updated Vital Signs BP 137/88 (BP Location: Left Arm)   Pulse 84   Temp 99.1 F (37.3 C) (Oral)   Resp 18   Ht 5\' 7"  (1.702 m)   Wt 130 kg   LMP 08/19/2019   SpO2 100%   BMI 44.89 kg/m   Physical Exam Vitals signs and nursing note reviewed.  Constitutional:      General: She is not in acute distress.    Appearance: She is well-developed.  HENT:     Head:  Normocephalic and atraumatic.  Eyes:     Comments: R eye: Right sided conjunctival injection, increased tearing, no significant swelling of orbit noted, normal EOM Pupils are equal round and reactive Visual fields intact b/l  Neck:     Musculoskeletal: Neck supple.  Cardiovascular:     Rate and Rhythm: Normal rate and regular rhythm.     Heart sounds: No murmur.  Pulmonary:     Effort: Pulmonary effort is normal. No respiratory distress.  Abdominal:     Palpations: Abdomen is soft.     Tenderness: There is no abdominal tenderness.  Musculoskeletal:        General: No swelling or tenderness.  Skin:    General: Skin is warm and dry.     Capillary Refill: Capillary refill takes less than 2 seconds.  Neurological:     General: No focal deficit present.     Mental Status: She is alert and oriented to person, place, and time.      ED Treatments / Results  Labs (all labs ordered are listed, but only abnormal results are displayed) Labs Reviewed - No data to display  EKG None  Radiology No results found.  Procedures Procedures (including critical care time)  Medications Ordered in ED Medications  diphenhydrAMINE (BENADRYL) capsule 25 mg (has no administration in time range)     Initial Impression / Assessment and Plan / ED Course  I have reviewed the triage vital signs and the nursing notes.  Pertinent labs & imaging results that were available during my care of the patient were reviewed by me and considered in my medical decision making (see chart for details).        29 year old right eye redness, itching.  Suspect viral conjunctivitis versus allergic process.  No purulent drainage, doubt bacterial conjunctivitis.  No vision change.  Will try dose Benadryl, recommend over-the-counter teardrops, warm compresses.  Reviewed return precautions, will discharge home.    After the discussed management above, the patient was determined to be safe for discharge.  The  patient was in agreement with this plan and all questions regarding their care were answered.  ED return precautions were discussed and the patient will return to the ED with any significant worsening of condition.    Final Clinical Impressions(s) / ED Diagnoses   Final diagnoses:  Conjunctivitis of right eye, unspecified conjunctivitis type    ED Discharge Orders    None       Lucrezia Starch, MD 08/20/19 [phone]

## 2019-08-19 NOTE — ED Notes (Signed)
An After Visit Summary was printed and given to the patient. Discharge instructions given and no further questions at this time.  

## 2019-08-19 NOTE — Discharge Instructions (Signed)
I recommend using warm compresses 3-4 times daily for the next couple days.  Additionally recommend using Benadryl as needed for itching.  Recommend using over-the-counter Visine teardrops.  If you develop worsening drainage, yellow or pus drainage, vision changes, please return to ER for recheck.

## 2019-08-23 ENCOUNTER — Other Ambulatory Visit: Payer: Self-pay

## 2019-08-23 ENCOUNTER — Encounter (HOSPITAL_COMMUNITY): Payer: Self-pay

## 2019-08-23 ENCOUNTER — Emergency Department (HOSPITAL_COMMUNITY)
Admission: EM | Admit: 2019-08-23 | Discharge: 2019-08-23 | Disposition: A | Discharge status: Home / Self Care | Payer: Medicaid Other | Attending: Emergency Medicine | Admitting: Emergency Medicine

## 2019-08-23 DIAGNOSIS — R21 Rash and other nonspecific skin eruption: Secondary | ICD-10-CM | POA: Insufficient documentation

## 2019-08-23 DIAGNOSIS — Z79899 Other long term (current) drug therapy: Secondary | ICD-10-CM | POA: Insufficient documentation

## 2019-08-23 DIAGNOSIS — J45909 Unspecified asthma, uncomplicated: Secondary | ICD-10-CM | POA: Insufficient documentation

## 2019-08-23 MED ORDER — PREDNISONE 20 MG PO TABS: ORAL_TABLET | ORAL | 0 refills | Status: DC

## 2019-08-23 MED ORDER — LORATADINE 10 MG PO TABS: 10.0000 mg | ORAL_TABLET | Freq: Every day | ORAL | 0 refills | Status: DC

## 2019-08-23 NOTE — Discharge Instructions (Signed)
Please read and follow all provided instructions.  Your diagnoses today include:  1. Rash and nonspecific skin eruption     Tests performed today include:  Vital signs. See below for your results today.   Medications prescribed:   Prednisone - steroid medicine   It is best to take this medication in the morning to prevent sleeping problems. If you are diabetic, monitor your blood sugar closely and stop taking Prednisone if blood sugar is over 300. Take with food to prevent stomach upset.    Claritin -nonsedating antihistamine to help with itching  Take any prescribed medications only as directed.  Home care instructions:  Follow any educational materials contained in this packet.  BE VERY CAREFUL not to take multiple medicines containing Tylenol (also called acetaminophen). Doing so can lead to an overdose which can damage your liver and cause liver failure and possibly death.   Follow-up instructions: Please follow-up with your primary care provider in the next 7 days for further evaluation of your symptoms.   Return instructions:   Please return to the Emergency Department if you experience worsening symptoms.   Return with any facial or lip swelling, shortness of breath.  Please return if you have any other emergent concerns.  Additional Information:  Your vital signs today were: BP (!) 142/85 (BP Location: Right Arm)    Pulse 80    Temp 98.5 F (36.9 C) (Oral)    Resp 16    Ht 5\' 7"  (1.702 m)    Wt 130 kg    LMP 08/19/2019    SpO2 100%    BMI 44.89 kg/m  If your blood pressure (BP) was elevated above 135/85 this visit, please have this repeated by your doctor within one month. --------------

## 2019-08-23 NOTE — ED Triage Notes (Signed)
Pt arrives POV from home with complaints of a rash on hands, trunk, face. Pt reports the rash burns and itches. Pt denies allergies. Pt reports the rash began last night. Pt reports hx of eczema

## 2019-08-23 NOTE — ED Provider Notes (Signed)
[facility] DEPT Provider Note   CSN: [id number] Arrival date & time: 08/23/19  1157     History   Chief Complaint Chief Complaint  Patient presents with  . Rash    HPI Glenda Mendez is a 28 y.o. female.     Patient with history of allergies/eczema and asthma presents the emergency department today with generalized rash.  Patient states that she was seen in the emergency department a couple days ago for some itching around her eyes.  She has developed a rash on her face, back, chest, arms.  She denies any fevers.  No face or lip swelling, wheezing or trouble breathing, vomiting, diarrhea, syncope.  She denies any new medications or food or drug exposures other than eyedrops prescribed at previous visit.  She is not staying anywhere new.  No one around her has had similar rashes.  Otherwise no recent illness.     Past Medical History:  Diagnosis Date  . ADHD (attention deficit hyperactivity disorder)   . Allergy   . Asthma   . BMI (body mass index), pediatric, 95-99% for age 41/10/2010   Is starting to gain weight again and advised and counseled today about reduction for health risk reasons. Patient is very well aware of how to do lifestyle intervention she has done this before.   Marland Kitchen Hearing loss in right ear    with hearing aid bilateral   . Hemiparesis ([place])    rt from neonatal period  . Hypoxia of newborn   . Irregular periods 06/04/2011  . PCOS (polycystic ovarian syndrome)   . Pulmonary hemorrhage of fetus or newborn    37 week C-section [facility] of with birth hypoxia    Patient Active Problem List   Diagnosis Date Noted  . Impingement of left ankle joint 12/02/2017  . Left ankle pain 06/23/2017  . Ganglion cyst of flexor tendon sheath of finger of right hand 02/02/2017  . Trigger finger, right middle finger 02/02/2017  . Eczema 07/11/2016  . Dyspnea and respiratory abnormality 05/08/2016  . Elevated testosterone level in female 08/28/2015   . Elevated hemoglobin A1c 08/28/2015  . Abdominal pain, right upper quadrant 06/05/2015  . Abnormal urine 06/05/2015  . Alkaline phosphatase elevation 04/27/2015  . Severe obesity (BMI >= 40) ([place]) 12/19/2014  . Asthma with acute exacerbation 08/29/2014  . Moderate intermittent asthma, uncomplicated 99991111  . Rash, skin 06/20/2014  . Moderate persistent asthma with acute exacerbation in adult 05/27/2014  . Anterior chest wall pain 05/27/2014  . Low grade squamous intraepithelial lesion (LGSIL) on cervical Pap smear 12/02/2013  . Obesity (BMI 30-39.9) 11/28/2013  . Visit for preventive health examination 11/28/2013  . Encounter for routine gynecological examination 11/28/2013  . Weight gain 11/28/2013  . Asthma 11/28/2013  . Moderate persistent asthma without complication 123456  . Hemiparesis ([place])   . Oral contraceptive pill surveillance 04/25/2012  . Headache 06/04/2011  . Irregular periods 06/04/2011  . Acne 06/04/2011  . BCP (birth control pills) initiation 06/04/2011  . Preventative health care 02/18/2011  . Neoplasm of uncertain behavior of skin 02/18/2011  . BMI (body mass index), pediatric, 95-99% for age 53/10/2010  . Lumbar back pain 09/17/2010  . NECK PAIN 01/02/2009  . FATIGUE 12/06/2008  . Attention deficit hyperactivity disorder (ADHD) 09/03/2007  . UNSPECIFIED HEARING LOSS 09/03/2007  . ALLERGIC RHINITIS 05/19/2007  . ASTHMA 05/19/2007    Past Surgical History:  Procedure Laterality Date  . TONSILLECTOMY  OB History   No obstetric history on file.      Home Medications    Prior to Admission medications   Medication Sig Start Date End Date Taking? Authorizing Provider  albuterol (PROVENTIL) (2.5 MG/3ML) 0.083% nebulizer solution Take 3 mLs (2.5 mg total) by nebulization every 4 (four) hours as needed for wheezing or shortness of breath. 06/22/19   Hedges, Dellis Filbert, PA-C  albuterol (VENTOLIN HFA) 108 (90 Base) MCG/ACT inhaler Inhale 2 puffs  into the lungs every 6 (six) hours as needed for wheezing or shortness of breath. 06/22/19   Hedges, Dellis Filbert, PA-C  betamethasone dipropionate (DIPROLENE) 0.05 % cream Apply topically 2 (two) times daily.    [provider]  desonide (DESOWEN) 0.05 % cream Apply topically 2 (two) times daily.    [provider]  diclofenac (VOLTAREN) 75 MG EC tablet Take 1 tablet (75 mg total) by mouth 2 (two) times daily. Take 1 tab bid X 10 days then as needed Patient not taking: Reported on 01/18/2019 11/16/17   Gerda Diss, DO  fluticasone [facility]) 50 MCG/ACT nasal spray Place 2 sprays into both nostrils daily. 02/18/17   Nafziger, [name], NP  fluticasone-salmeterol (ADVAIR HFA) 230-21 MCG/ACT inhaler Inhale 2 puffs into the lungs 2 (two) times daily. 08/31/18   Brand Males, MD  hydrOXYzine (ATARAX/VISTARIL) 10 MG tablet Take 1 tablet (10 mg total) by mouth daily as needed for itching. 08/12/16   Rigoberto Noel, MD  ibuprofen (ADVIL,MOTRIN) 800 MG tablet Take 1 tablet (800 mg total) by mouth every 8 (eight) hours as needed. 01/18/19   Teresa Coombs D, DO  ipratropium (ATROVENT) 0.02 % nebulizer solution USE 1 VIAL BY NEBULIZATION 3 (THREE) TIMES DAILY AS NEEDED FOR WHEEZING. **DUE FOR ANNUAL VISIT June 2019** 06/22/19   Hedges, Dellis Filbert, PA-C  levocetirizine (XYZAL ALLERGY 24HR) 5 MG tablet Take 1 tablet (5 mg total) by mouth every evening. 02/18/17   Nafziger, [name], NP  loratadine (CLARITIN) 10 MG tablet Take 1 tablet (10 mg total) by mouth daily. 08/23/19   Carlisle Cater, PA-C  metFORMIN (GLUCOPHAGE-XR) 500 MG 24 hr tablet Take 1 tablet (500 mg total) by mouth daily with breakfast. 10/19/18 10/19/19  Panosh, Standley Brooking, MD  methocarbamol (ROBAXIN) 500 MG tablet Take 1 tablet (500 mg total) by mouth every 8 (eight) hours as needed for muscle spasms. 01/18/19   Gerda Diss, DO  methylphenidate 36 MG PO CR tablet TAKE (2) TABLETS BY MOUTH DAILY. 01/19/19   Panosh, Standley Brooking, MD  Naproxen-Esomeprazole  (VIMOVO) 500-20 MG TBEC Take 1 tablet by mouth 2 (two) times daily. Patient not taking: Reported on 01/18/2019 07/23/17   Gerda Diss, DO  norgestimate-ethinyl estradiol (ORTHO-CYCLEN, 28,) 0.25-35 MG-MCG tablet Take 1 tablet by mouth daily. 10/19/18   Panosh, Standley Brooking, MD  omeprazole (PRILOSEC) 20 MG capsule Take 1 capsule (20 mg total) by mouth 2 (two) times daily before a meal. 06/20/14   Panosh, Standley Brooking, MD  pantoprazole (PROTONIX) 40 MG tablet Take 1 tablet (40 mg total) by mouth daily. 11/26/16   Panosh, Standley Brooking, MD  predniSONE (DELTASONE) 20 MG tablet 3 Tabs PO Days 1-3, then 2 tabs PO Days 4-6, then 1 tab PO Day 7-9, then Half Tab PO Day 10-12 08/23/19   Carlisle Cater, PA-C  Vitamin D, Ergocalciferol, (DRISDOL) 1.25 MG (50000 UT) CAPS capsule Take 1 capsule (50,000 Units total) by mouth every 7 (seven) days. 08/25/18   Panosh, Standley Brooking, MD    Family History  Family History  Adopted: Yes  Problem Relation Age of Onset  . Hypertension Other   . Schizophrenia Mother        with alcohol drug abuse  . Diabetes Other     Social History Social History   Tobacco Use  . Smoking status: Never Smoker  . Smokeless tobacco: Never Used  Substance Use Topics  . Alcohol use: No    Alcohol/week: 0.0 standard drinks  . Drug use: No     Allergies   Patient has no known allergies.   Review of Systems Review of Systems  Constitutional: Negative for fever.  HENT: Negative for rhinorrhea and sore throat.   Eyes: Positive for itching. Negative for pain, discharge and redness.  Respiratory: Negative for cough.   Cardiovascular: Negative for chest pain.  Gastrointestinal: Negative for abdominal pain, diarrhea, nausea and vomiting.  Genitourinary: Negative for dysuria.  Musculoskeletal: Negative for myalgias.  Skin: Positive for rash.  Neurological: Negative for headaches.     Physical Exam Updated Vital Signs BP (!) 142/85 (BP Location: Right Arm)   Pulse 80   Temp 98.5 F (36.9 C)  (Oral)   Resp 16   Ht 5\' 7"  (1.702 m)   Wt 130 kg   LMP 08/19/2019   SpO2 100%   BMI 44.89 kg/m   Physical Exam Vitals signs and nursing note reviewed.  Constitutional:      Appearance: She is well-developed.  HENT:     Head: Normocephalic and atraumatic.     Mouth/Throat:     Comments: Oropharynx is clear without lesions.  No signs of angioedema. Eyes:     General:        Right eye: No discharge.        Left eye: No discharge.     Conjunctiva/sclera: Conjunctivae normal.  Neck:     Musculoskeletal: Normal range of motion and neck supple.  Cardiovascular:     Rate and Rhythm: Normal rate and regular rhythm.     Heart sounds: Normal heart sounds.  Pulmonary:     Effort: Pulmonary effort is normal.     Breath sounds: Normal breath sounds.  Abdominal:     Palpations: Abdomen is soft.     Tenderness: There is no abdominal tenderness.  Skin:    General: Skin is warm and dry.     Comments: Patient with generalized maculopapular rash scattered about the face, neck, upper chest, entire back, arms.  Spares palms.  There are excoriations on the forearms and back.  Appears more like dermatitis than urticaria.  Neurological:     Mental Status: She is alert.      ED Treatments / Results  Labs (all labs ordered are listed, but only abnormal results are displayed) Labs Reviewed - No data to display  EKG None  Radiology No results found.  Procedures Procedures (including critical care time)  Medications Ordered in ED Medications - No data to display   Initial Impression / Assessment and Plan / ED Course  I have reviewed the triage vital signs and the nursing notes.  Pertinent labs & imaging results that were available during my care of the patient were reviewed by me and considered in my medical decision making (see chart for details).        Patient seen and examined.  Patient with nonspecific skin rash.  Appears allergic in nature.  No systemic symptoms of  illness.  No recent tick bites or signs of anaphylaxis.  Will treat with tapered  course of prednisone as well as Claritin for itching.  She may use Benadryl at night if desired.  Encourage PCP follow-up as needed.  Vital signs reviewed and are as follows: BP (!) 142/85 (BP Location: Right Arm)   Pulse 80   Temp 98.5 F (36.9 C) (Oral)   Resp 16   Ht 5\' 7"  (1.702 m)   Wt 130 kg   LMP 08/19/2019   SpO2 100%   BMI 44.89 kg/m     Final Clinical Impressions(s) / ED Diagnoses   Final diagnoses:  Rash and nonspecific skin eruption   Nonspecific skin rash.  No obvious etiology.  She appears well, no signs of sepsis or anaphylaxis.  ED Discharge Orders         Ordered    predniSONE (DELTASONE) 20 MG tablet     08/23/19 1236    loratadine (CLARITIN) 10 MG tablet  Daily     08/23/19 1236           Carlisle Cater, PA-C 08/23/19 1241    Maudie Flakes, MD 08/25/19 1456

## 2019-09-30 ENCOUNTER — Other Ambulatory Visit: Payer: Self-pay

## 2019-10-03 ENCOUNTER — Ambulatory Visit: Payer: Medicaid Other

## 2019-10-26 ENCOUNTER — Other Ambulatory Visit: Payer: Self-pay

## 2019-10-26 ENCOUNTER — Encounter (HOSPITAL_COMMUNITY): Payer: Self-pay

## 2019-10-26 ENCOUNTER — Emergency Department (HOSPITAL_COMMUNITY)
Admission: EM | Admit: 2019-10-26 | Discharge: 2019-10-27 | Disposition: A | Discharge status: Home / Self Care | Payer: Medicaid Other | Attending: Emergency Medicine | Admitting: Emergency Medicine

## 2019-10-26 DIAGNOSIS — Z79899 Other long term (current) drug therapy: Secondary | ICD-10-CM | POA: Insufficient documentation

## 2019-10-26 DIAGNOSIS — Z7984 Long term (current) use of oral hypoglycemic drugs: Secondary | ICD-10-CM | POA: Insufficient documentation

## 2019-10-26 DIAGNOSIS — T7840XA Allergy, unspecified, initial encounter: Secondary | ICD-10-CM | POA: Insufficient documentation

## 2019-10-26 DIAGNOSIS — J45909 Unspecified asthma, uncomplicated: Secondary | ICD-10-CM | POA: Insufficient documentation

## 2019-10-26 MED ORDER — DIPHENHYDRAMINE HCL 50 MG/ML IJ SOLN
25.0000 mg | Freq: Once | INTRAMUSCULAR | Status: AC
  Administered 2019-10-26: 25 mg via INTRAVENOUS
  Filled 2019-10-26: qty 1

## 2019-10-26 MED ORDER — EPINEPHRINE 0.3 MG/0.3ML IJ SOAJ
INTRAMUSCULAR | Status: AC
  Administered 2019-10-26: 1 mg
  Filled 2019-10-26: qty 0.3

## 2019-10-26 MED ORDER — METHYLPREDNISOLONE SODIUM SUCC 125 MG IJ SOLR
125.0000 mg | Freq: Once | INTRAMUSCULAR | Status: AC
  Administered 2019-10-26: 125 mg via INTRAVENOUS
  Filled 2019-10-26: qty 2

## 2019-10-26 MED ORDER — FAMOTIDINE IN NACL 20-0.9 MG/50ML-% IV SOLN
20.0000 mg | Freq: Once | INTRAVENOUS | Status: AC
  Administered 2019-10-26: 20 mg via INTRAVENOUS
  Filled 2019-10-26: qty 50

## 2019-10-26 NOTE — ED Triage Notes (Signed)
Pt coming in c/o allergic rxn to unknown item. Pt having increased difficulty swallowing and breathing and a rash on her body that started at 6:30. Pt states she took benadryl at 7.

## 2019-10-26 NOTE — ED Provider Notes (Signed)
[facility] DEPT Provider Note   CSN: [id number] Arrival date & time: 10/26/19  2130     History Chief Complaint  Patient presents with  . Allergic Reaction    Glenda Mendez is a 29 y.o. female.  HPI Patient presents to the emergency department with allergic reaction that started on 630.  The patient states that she is unsure as to what might of caused this.  She states she has not had any new foods and she has been on a fast over the last 12 to 18 hours.  Patient states that she has no new prescriptions and no new exposures to chemicals or soaps lotions or detergents.  Patient states she started with a rash all over her body along with swelling of the tongue felt like her throat was swelling in her lip.  The patient denies chest pain, shortness of breath, headache,blurred vision, neck pain, fever, cough, weakness, numbness, dizziness, anorexia, edema, abdominal pain, nausea, vomiting, diarrhea, back pain, dysuria, hematemesis, bloody stool, near syncope, or syncope.    Past Medical History:  Diagnosis Date  . ADHD (attention deficit hyperactivity disorder)   . Allergy   . Asthma   . BMI (body mass index), pediatric, 95-99% for age 32/10/2010   Is starting to gain weight again and advised and counseled today about reduction for health risk reasons. Patient is very well aware of how to do lifestyle intervention she has done this before.   Marland Kitchen Hearing loss in right ear    with hearing aid bilateral   . Hemiparesis ([place])    rt from neonatal period  . Hypoxia of newborn   . Irregular periods 06/04/2011  . PCOS (polycystic ovarian syndrome)   . Pulmonary hemorrhage of fetus or newborn    37 week C-section [facility] of with birth hypoxia    Patient Active Problem List   Diagnosis Date Noted  . Impingement of left ankle joint 12/02/2017  . Left ankle pain 06/23/2017  . Ganglion cyst of flexor tendon sheath of finger of right hand 02/02/2017  . Trigger finger,  right middle finger 02/02/2017  . Eczema 07/11/2016  . Dyspnea and respiratory abnormality 05/08/2016  . Elevated testosterone level in female 08/28/2015  . Elevated hemoglobin A1c 08/28/2015  . Abdominal pain, right upper quadrant 06/05/2015  . Abnormal urine 06/05/2015  . Alkaline phosphatase elevation 04/27/2015  . Severe obesity (BMI >= 40) ([place]) 12/19/2014  . Asthma with acute exacerbation 08/29/2014  . Moderate intermittent asthma, uncomplicated 99991111  . Rash, skin 06/20/2014  . Moderate persistent asthma with acute exacerbation in adult 05/27/2014  . Anterior chest wall pain 05/27/2014  . Low grade squamous intraepithelial lesion (LGSIL) on cervical Pap smear 12/02/2013  . Obesity (BMI 30-39.9) 11/28/2013  . Visit for preventive health examination 11/28/2013  . Encounter for routine gynecological examination 11/28/2013  . Weight gain 11/28/2013  . Asthma 11/28/2013  . Moderate persistent asthma without complication 123456  . Hemiparesis ([place])   . Oral contraceptive pill surveillance 04/25/2012  . Headache 06/04/2011  . Irregular periods 06/04/2011  . Acne 06/04/2011  . BCP (birth control pills) initiation 06/04/2011  . Preventative health care 02/18/2011  . Neoplasm of uncertain behavior of skin 02/18/2011  . BMI (body mass index), pediatric, 95-99% for age 83/10/2010  . Lumbar back pain 09/17/2010  . NECK PAIN 01/02/2009  . FATIGUE 12/06/2008  . Attention deficit hyperactivity disorder (ADHD) 09/03/2007  . UNSPECIFIED HEARING LOSS 09/03/2007  . ALLERGIC RHINITIS 05/19/2007  .  ASTHMA 05/19/2007    Past Surgical History:  Procedure Laterality Date  . TONSILLECTOMY       OB History   No obstetric history on file.     Family History  Adopted: Yes  Problem Relation Age of Onset  . Hypertension Other   . Schizophrenia Mother        with alcohol drug abuse  . Diabetes Other     Social History   Tobacco Use  . Smoking status: Never Smoker  .  Smokeless tobacco: Never Used  Substance Use Topics  . Alcohol use: No    Alcohol/week: 0.0 standard drinks  . Drug use: No    Home Medications Prior to Admission medications   Medication Sig Start Date End Date Taking? Authorizing Provider  albuterol (PROVENTIL) (2.5 MG/3ML) 0.083% nebulizer solution Take 3 mLs (2.5 mg total) by nebulization every 4 (four) hours as needed for wheezing or shortness of breath. 06/22/19  Yes Hedges, Dellis Filbert, PA-C  albuterol (VENTOLIN HFA) 108 (90 Base) MCG/ACT inhaler Inhale 2 puffs into the lungs every 6 (six) hours as needed for wheezing or shortness of breath. 06/22/19  Yes Hedges, Dellis Filbert, PA-C  betamethasone dipropionate (DIPROLENE) 0.05 % cream Apply topically 2 (two) times daily.   Yes [provider]  desonide (DESOWEN) 0.05 % cream Apply topically 2 (two) times daily.   Yes [provider]  fluticasone (FLONASE) 50 MCG/ACT nasal spray Place 2 sprays into both nostrils daily. 02/18/17  Yes Nafziger, [name], NP  fluticasone-salmeterol (ADVAIR HFA) 230-21 MCG/ACT inhaler Inhale 2 puffs into the lungs 2 (two) times daily. 08/31/18  Yes Brand Males, MD  hydrOXYzine (ATARAX/VISTARIL) 10 MG tablet Take 1 tablet (10 mg total) by mouth daily as needed for itching. 08/12/16  Yes Rigoberto Noel, MD  ibuprofen (ADVIL,MOTRIN) 800 MG tablet Take 1 tablet (800 mg total) by mouth every 8 (eight) hours as needed. 01/18/19  Yes Teresa Coombs D, DO  ipratropium (ATROVENT) 0.02 % nebulizer solution USE 1 VIAL BY NEBULIZATION 3 (THREE) TIMES DAILY AS NEEDED FOR WHEEZING. **DUE FOR ANNUAL VISIT June 2019** 06/22/19  Yes Hedges, Dellis Filbert, PA-C  levocetirizine (XYZAL ALLERGY 24HR) 5 MG tablet Take 1 tablet (5 mg total) by mouth every evening. 02/18/17  Yes Nafziger, [name], NP  loratadine (CLARITIN) 10 MG tablet Take 1 tablet (10 mg total) by mouth daily. 08/23/19  Yes Carlisle Cater, PA-C  metFORMIN (GLUCOPHAGE-XR) 500 MG 24 hr tablet Take 1 tablet (500 mg total) by  mouth daily with breakfast. 10/19/18 10/26/19 Yes Panosh, Standley Brooking, MD  methocarbamol (ROBAXIN) 500 MG tablet Take 1 tablet (500 mg total) by mouth every 8 (eight) hours as needed for muscle spasms. 01/18/19  Yes Gerda Diss, DO  methylphenidate 36 MG PO CR tablet TAKE (2) TABLETS BY MOUTH DAILY. Patient taking differently: Take 72 mg by mouth daily.  01/19/19  Yes Panosh, Standley Brooking, MD  pantoprazole (PROTONIX) 40 MG tablet Take 1 tablet (40 mg total) by mouth daily. 11/26/16  Yes Panosh, Standley Brooking, MD  Vitamin D, Ergocalciferol, (DRISDOL) 1.25 MG (50000 UT) CAPS capsule Take 1 capsule (50,000 Units total) by mouth every 7 (seven) days. 08/25/18  Yes Panosh, Standley Brooking, MD  diclofenac (VOLTAREN) 75 MG EC tablet Take 1 tablet (75 mg total) by mouth 2 (two) times daily. Take 1 tab bid X 10 days then as needed Patient not taking: Reported on 01/18/2019 11/16/17   Gerda Diss, DO  Naproxen-Esomeprazole (VIMOVO) 500-20 MG TBEC Take 1 tablet  by mouth 2 (two) times daily. Patient not taking: Reported on 01/18/2019 07/23/17   Gerda Diss, DO  omeprazole (PRILOSEC) 20 MG capsule Take 1 capsule (20 mg total) by mouth 2 (two) times daily before a meal. Patient not taking: Reported on 10/26/2019 06/20/14   Panosh, Standley Brooking, MD  predniSONE (DELTASONE) 20 MG tablet 3 Tabs PO Days 1-3, then 2 tabs PO Days 4-6, then 1 tab PO Day 7-9, then Half Tab PO Day 10-12 Patient not taking: Reported on 10/26/2019 08/23/19   Carlisle Cater, PA-C    Allergies    Patient has no known allergies.  Review of Systems   Review of Systems All other systems negative except as documented in the HPI. All pertinent positives and negatives as reviewed in the HPI. Physical Exam Updated Vital Signs BP (!) 107/54   Pulse 77   Temp 98.1 F (36.7 C) (Oral)   Resp 16   SpO2 99%   Physical Exam Vitals and nursing note reviewed.  Constitutional:      General: She is not in acute distress.    Appearance: She is well-developed.  HENT:      Head: Normocephalic and atraumatic.     Comments: Patient does have a thickened voice has some swelling of her tongue along with her lower lip. Eyes:     Pupils: Pupils are equal, round, and reactive to light.  Cardiovascular:     Rate and Rhythm: Normal rate and regular rhythm.     Heart sounds: Normal heart sounds. No murmur. No friction rub. No gallop.   Pulmonary:     Effort: Pulmonary effort is normal. No respiratory distress.     Breath sounds: Normal breath sounds. No wheezing.  Abdominal:     General: Bowel sounds are normal. There is no distension.     Palpations: Abdomen is soft.     Tenderness: There is no abdominal tenderness.  Musculoskeletal:     Cervical back: Normal range of motion and neck supple.  Skin:    General: Skin is warm and dry.     Capillary Refill: Capillary refill takes less than 2 seconds.     Findings: Rash present. No erythema. Rash is urticarial.  Neurological:     Mental Status: She is alert and oriented to person, place, and time.     Motor: No abnormal muscle tone.     Coordination: Coordination normal.  Psychiatric:        Behavior: Behavior normal.     ED Results / Procedures / Treatments   Labs (all labs ordered are listed, but only abnormal results are displayed) Labs Reviewed - No data to display  EKG None  Radiology No results found.  Procedures Procedures (including critical care time)  Medications Ordered in ED Medications  EPINEPHrine (EPI-PEN) 0.3 mg/0.3 mL injection (1 mg  Given 10/26/19 2146)  methylPREDNISolone sodium succinate (SOLU-MEDROL) 125 mg/2 mL injection 125 mg (125 mg Intravenous Given 10/26/19 2201)  diphenhydrAMINE (BENADRYL) injection 25 mg (25 mg Intravenous Given 10/26/19 2201)  famotidine (PEPCID) IVPB 20 mg premix (20 mg Intravenous New Bag/Given 10/26/19 2201)    ED Course  I have reviewed the triage vital signs and the nursing notes.  Pertinent labs & imaging results that were available during my care of  the patient were reviewed by me and considered in my medical decision making (see chart for details).    MDM Rules/Calculators/A&P  The patient was given epinephrine pen to the fact that we did not have initial IV access.  She is also given Pepcid Benadryl and Solu-Medrol.  Patient has been rechecked x4 and is improving with each recheck.  Patient does not have any stridor and her voice is clear at this point.  Patient need to be monitored for a few more hours to ensure that her reaction has subsided most likely discharge with steroids Pepcid and Benadryl. Final Clinical Impression(s) / ED Diagnoses Final diagnoses:  None    Rx / DC Orders ED Discharge Orders    None       Rebeca Allegra 10/27/19 0002    Dorie Rank, MD 10/28/19 [phone]

## 2019-10-27 MED ORDER — EPINEPHRINE 0.3 MG/0.3ML IJ SOAJ: 0.3000 mg | INTRAMUSCULAR | 0 refills | Status: DC | PRN

## 2019-10-27 MED ORDER — PREDNISONE 10 MG PO TABS: ORAL_TABLET | ORAL | 0 refills | Status: DC

## 2019-10-27 MED ORDER — FAMOTIDINE 20 MG PO TABS: 20.0000 mg | ORAL_TABLET | Freq: Two times a day (BID) | ORAL | 0 refills | Status: DC

## 2019-10-27 NOTE — Discharge Instructions (Addendum)
Take medications as prescribed. Follow up with your doctor in 3 days for recheck.

## 2019-10-27 NOTE — ED Provider Notes (Signed)
Tongue/lip swollen, voice change, today about 6:30 EpiPen Solumedrol, Pepcid, Benadryl Much better now Obs for additional 2 hours  2:00 - patient sleeping, easily awakened. No facial or tongue swelling noted. VSS, 100% O2 saturation. She states she has significant improvement in symptoms.   Feel she is appropriate for discharge home per plan of previous treatment team.    Charlann Lange, PA-C 10/27/19 0202    Molpus, Jenny Reichmann, MD 10/27/19 [phone]

## 2019-11-01 ENCOUNTER — Emergency Department (HOSPITAL_COMMUNITY)
Admission: EM | Admit: 2019-11-01 | Discharge: 2019-11-01 | Disposition: A | Discharge status: Home / Self Care | Payer: Medicaid Other | Attending: Emergency Medicine | Admitting: Emergency Medicine

## 2019-11-01 ENCOUNTER — Encounter (HOSPITAL_COMMUNITY): Payer: Self-pay | Admitting: Emergency Medicine

## 2019-11-01 ENCOUNTER — Ambulatory Visit: Payer: Medicaid Other | Admitting: Internal Medicine

## 2019-11-01 ENCOUNTER — Other Ambulatory Visit: Payer: Self-pay

## 2019-11-01 DIAGNOSIS — T7840XA Allergy, unspecified, initial encounter: Secondary | ICD-10-CM

## 2019-11-01 DIAGNOSIS — Z79899 Other long term (current) drug therapy: Secondary | ICD-10-CM | POA: Insufficient documentation

## 2019-11-01 DIAGNOSIS — J45909 Unspecified asthma, uncomplicated: Secondary | ICD-10-CM | POA: Insufficient documentation

## 2019-11-01 MED ORDER — PREDNISONE 20 MG PO TABS: ORAL_TABLET | ORAL | 0 refills | Status: DC

## 2019-11-01 MED ORDER — FAMOTIDINE IN NACL 20-0.9 MG/50ML-% IV SOLN
20.0000 mg | Freq: Once | INTRAVENOUS | Status: AC
  Administered 2019-11-01: 20 mg via INTRAVENOUS
  Filled 2019-11-01: qty 50

## 2019-11-01 MED ORDER — METHYLPREDNISOLONE SODIUM SUCC 125 MG IJ SOLR
125.0000 mg | Freq: Once | INTRAMUSCULAR | Status: AC
  Administered 2019-11-01: 01:00:00 125 mg via INTRAVENOUS
  Filled 2019-11-01: qty 2

## 2019-11-01 MED ORDER — DIPHENHYDRAMINE HCL 50 MG/ML IJ SOLN
50.0000 mg | Freq: Once | INTRAMUSCULAR | Status: AC
  Administered 2019-11-01: 50 mg via INTRAVENOUS
  Filled 2019-11-01: qty 1

## 2019-11-01 MED ORDER — HYDROXYZINE HCL 25 MG PO TABS: 25.0000 mg | ORAL_TABLET | Freq: Four times a day (QID) | ORAL | 0 refills | Status: DC | PRN

## 2019-11-01 NOTE — ED Triage Notes (Signed)
Patient here from home with complaints of allergic reaction to unknown item. Reports bilateral leg itching and rash. Also reports difficulty swallowing. Denies trouble breathing. States that she was here for same on 1/6 and "has not gotten better".

## 2019-11-01 NOTE — ED Provider Notes (Signed)
[facility] DEPT Provider Note   CSN: [id number] Arrival date & time: 11/01/19  0025     History Chief Complaint  Patient presents with  . Allergic Reaction  . Pruritis    Urania THRESIA LANZA is a 29 y.o. female.  Patient returns to the ER for repeat evaluation of rash.  Patient was seen nearly 1 week ago when rash began.  She reports that tonight she woke up with increased rash all over her body in burning sensation of her skin with severe itching.  She feels like her throat is swelling.  She has had similar reactions in the past, never identified the cause.  She has not been placed on any new medications recently.  No new foods or skin products.        Past Medical History:  Diagnosis Date  . ADHD (attention deficit hyperactivity disorder)   . Allergy   . Asthma   . BMI (body mass index), pediatric, 95-99% for age 46/10/2010   Is starting to gain weight again and advised and counseled today about reduction for health risk reasons. Patient is very well aware of how to do lifestyle intervention she has done this before.   Marland Kitchen Hearing loss in right ear    with hearing aid bilateral   . Hemiparesis ([place])    rt from neonatal period  . Hypoxia of newborn   . Irregular periods 06/04/2011  . PCOS (polycystic ovarian syndrome)   . Pulmonary hemorrhage of fetus or newborn    78 week C-section [facility] of with birth hypoxia    Patient Active Problem List   Diagnosis Date Noted  . Impingement of left ankle joint 12/02/2017  . Left ankle pain 06/23/2017  . Ganglion cyst of flexor tendon sheath of finger of right hand 02/02/2017  . Trigger finger, right middle finger 02/02/2017  . Eczema 07/11/2016  . Dyspnea and respiratory abnormality 05/08/2016  . Elevated testosterone level in female 08/28/2015  . Elevated hemoglobin A1c 08/28/2015  . Abdominal pain, right upper quadrant 06/05/2015  . Abnormal urine 06/05/2015  . Alkaline phosphatase elevation 04/27/2015    . Severe obesity (BMI >= 40) ([place]) 12/19/2014  . Asthma with acute exacerbation 08/29/2014  . Moderate intermittent asthma, uncomplicated 99991111  . Rash, skin 06/20/2014  . Moderate persistent asthma with acute exacerbation in adult 05/27/2014  . Anterior chest wall pain 05/27/2014  . Low grade squamous intraepithelial lesion (LGSIL) on cervical Pap smear 12/02/2013  . Obesity (BMI 30-39.9) 11/28/2013  . Visit for preventive health examination 11/28/2013  . Encounter for routine gynecological examination 11/28/2013  . Weight gain 11/28/2013  . Asthma 11/28/2013  . Moderate persistent asthma without complication 123456  . Hemiparesis ([place])   . Oral contraceptive pill surveillance 04/25/2012  . Headache 06/04/2011  . Irregular periods 06/04/2011  . Acne 06/04/2011  . BCP (birth control pills) initiation 06/04/2011  . Preventative health care 02/18/2011  . Neoplasm of uncertain behavior of skin 02/18/2011  . BMI (body mass index), pediatric, 95-99% for age 73/10/2010  . Lumbar back pain 09/17/2010  . NECK PAIN 01/02/2009  . FATIGUE 12/06/2008  . Attention deficit hyperactivity disorder (ADHD) 09/03/2007  . UNSPECIFIED HEARING LOSS 09/03/2007  . ALLERGIC RHINITIS 05/19/2007  . ASTHMA 05/19/2007    Past Surgical History:  Procedure Laterality Date  . TONSILLECTOMY       OB History   No obstetric history on file.     Family History  Adopted: Yes  Problem Relation  Age of Onset  . Hypertension Other   . Schizophrenia Mother        with alcohol drug abuse  . Diabetes Other     Social History   Tobacco Use  . Smoking status: Never Smoker  . Smokeless tobacco: Never Used  Substance Use Topics  . Alcohol use: No    Alcohol/week: 0.0 standard drinks  . Drug use: No    Home Medications Prior to Admission medications   Medication Sig Start Date End Date Taking? Authorizing Provider  albuterol (PROVENTIL) (2.5 MG/3ML) 0.083% nebulizer solution Take 3 mLs (2.5  mg total) by nebulization every 4 (four) hours as needed for wheezing or shortness of breath. 06/22/19  Yes Hedges, Dellis Filbert, PA-C  albuterol (VENTOLIN HFA) 108 (90 Base) MCG/ACT inhaler Inhale 2 puffs into the lungs every 6 (six) hours as needed for wheezing or shortness of breath. 06/22/19  Yes Hedges, Dellis Filbert, PA-C  betamethasone dipropionate (DIPROLENE) 0.05 % cream Apply 1 application topically 2 (two) times daily.    Yes [provider]  desonide (DESOWEN) 0.05 % cream Apply 1 application topically 2 (two) times daily.    Yes [provider]  fluticasone (FLONASE) 50 MCG/ACT nasal spray Place 2 sprays into both nostrils daily. 02/18/17  Yes Nafziger, [name], NP  fluticasone-salmeterol (ADVAIR HFA) 230-21 MCG/ACT inhaler Inhale 2 puffs into the lungs 2 (two) times daily. 08/31/18  Yes Brand Males, MD  ibuprofen (ADVIL,MOTRIN) 800 MG tablet Take 1 tablet (800 mg total) by mouth every 8 (eight) hours as needed. Patient taking differently: Take 800 mg by mouth every 8 (eight) hours as needed for fever, headache, mild pain, moderate pain or cramping.  01/18/19  Yes Teresa Coombs D, DO  ipratropium (ATROVENT) 0.02 % nebulizer solution USE 1 VIAL BY NEBULIZATION 3 (THREE) TIMES DAILY AS NEEDED FOR WHEEZING. **DUE FOR ANNUAL VISIT June 2019** 06/22/19  Yes Hedges, Dellis Filbert, PA-C  levocetirizine (XYZAL ALLERGY 24HR) 5 MG tablet Take 1 tablet (5 mg total) by mouth every evening. 02/18/17  Yes Nafziger, [name], NP  loratadine (CLARITIN) 10 MG tablet Take 1 tablet (10 mg total) by mouth daily. 08/23/19  Yes Carlisle Cater, PA-C  metFORMIN (GLUCOPHAGE-XR) 500 MG 24 hr tablet Take 1 tablet (500 mg total) by mouth daily with breakfast. 10/19/18 11/01/19 Yes Panosh, Standley Brooking, MD  methocarbamol (ROBAXIN) 500 MG tablet Take 1 tablet (500 mg total) by mouth every 8 (eight) hours as needed for muscle spasms. 01/18/19  Yes Gerda Diss, DO  methylphenidate 36 MG PO CR tablet TAKE (2) TABLETS BY MOUTH  DAILY. Patient taking differently: Take 72 mg by mouth daily.  01/19/19  Yes Panosh, Standley Brooking, MD  pantoprazole (PROTONIX) 40 MG tablet Take 1 tablet (40 mg total) by mouth daily. 11/26/16  Yes Panosh, Standley Brooking, MD  Vitamin D, Ergocalciferol, (DRISDOL) 1.25 MG (50000 UT) CAPS capsule Take 1 capsule (50,000 Units total) by mouth every 7 (seven) days. 08/25/18  Yes Panosh, Standley Brooking, MD  diclofenac (VOLTAREN) 75 MG EC tablet Take 1 tablet (75 mg total) by mouth 2 (two) times daily. Take 1 tab bid X 10 days then as needed Patient not taking: Reported on 01/18/2019 11/16/17   Gerda Diss, DO  EPINEPHrine 0.3 mg/0.3 mL IJ SOAJ injection Inject 0.3 mLs (0.3 mg total) into the muscle as needed for anaphylaxis. 10/27/19   Charlann Lange, PA-C  famotidine (PEPCID) 20 MG tablet Take 1 tablet (20 mg total) by mouth 2 (two) times daily. Take twice  daily for 3 days, then as needed if symptoms recur 10/27/19   Charlann Lange, PA-C  hydrOXYzine (ATARAX/VISTARIL) 25 MG tablet Take 1-2 tablets (25-50 mg total) by mouth every 6 (six) hours as needed for itching. 11/01/19   Lucious Zou, Gwenyth Allegra, MD  Naproxen-Esomeprazole (VIMOVO) 500-20 MG TBEC Take 1 tablet by mouth 2 (two) times daily. Patient not taking: Reported on 01/18/2019 07/23/17   Gerda Diss, DO  omeprazole (PRILOSEC) 20 MG capsule Take 1 capsule (20 mg total) by mouth 2 (two) times daily before a meal. Patient not taking: Reported on 10/26/2019 06/20/14   Panosh, Standley Brooking, MD  predniSONE (DELTASONE) 20 MG tablet 3 tabs po daily x 3 days, then 2 tabs x 3 days, then 1.5 tabs x 3 days, then 1 tab x 3 days, then 0.5 tabs x 3 days 11/01/19   Orpah Greek, MD    Allergies    Patient has no known allergies.  Review of Systems   Review of Systems  Skin: Positive for rash.  All other systems reviewed and are negative.   Physical Exam Updated Vital Signs BP (!) 143/68 (BP Location: Left Arm)   Pulse (!) 101   Temp 98.4 F (36.9 C) (Oral)   Resp (!) 22    Ht 5\' 7"  (1.702 m)   Wt 127 kg   SpO2 100%   BMI 43.85 kg/m   Physical Exam Vitals and nursing note reviewed.  Constitutional:      General: She is not in acute distress.    Appearance: Normal appearance. She is well-developed.  HENT:     Head: Normocephalic and atraumatic.     Right Ear: Hearing normal.     Left Ear: Hearing normal.     Nose: Nose normal.  Eyes:     Conjunctiva/sclera: Conjunctivae normal.     Pupils: Pupils are equal, round, and reactive to light.  Cardiovascular:     Rate and Rhythm: Regular rhythm.     Heart sounds: S1 normal and S2 normal. No murmur. No friction rub. No gallop.   Pulmonary:     Effort: Pulmonary effort is normal. No respiratory distress.     Breath sounds: Normal breath sounds.  Chest:     Chest wall: No tenderness.  Abdominal:     General: Bowel sounds are normal.     Palpations: Abdomen is soft.     Tenderness: There is no abdominal tenderness. There is no guarding or rebound. Negative signs include Murphy's sign and McBurney's sign.     Hernia: No hernia is present.  Musculoskeletal:        General: Normal range of motion.     Cervical back: Normal range of motion and neck supple.  Skin:    General: Skin is warm and dry.     Findings: No rash.     Comments: Diffuse excoriations and scabbing on extremities and trunk with scattered erythematous papular type rash  Neurological:     Mental Status: She is alert and oriented to person, place, and time.     GCS: GCS eye subscore is 4. GCS verbal subscore is 5. GCS motor subscore is 6.     Cranial Nerves: No cranial nerve deficit.     Sensory: No sensory deficit.     Coordination: Coordination normal.  Psychiatric:        Speech: Speech normal.        Behavior: Behavior normal.        Thought Content: Thought  content normal.     ED Results / Procedures / Treatments   Labs (all labs ordered are listed, but only abnormal results are displayed) Labs Reviewed - No data to  display  EKG None  Radiology No results found.  Procedures Procedures (including critical care time)  Medications Ordered in ED Medications  diphenhydrAMINE (BENADRYL) injection 50 mg (50 mg Intravenous Given 11/01/19 0054)  methylPREDNISolone sodium succinate (SOLU-MEDROL) 125 mg/2 mL injection 125 mg (125 mg Intravenous Given 11/01/19 0055)  famotidine (PEPCID) IVPB 20 mg premix (0 mg Intravenous Stopped 11/01/19 0155)    ED Course  I have reviewed the triage vital signs and the nursing notes.  Pertinent labs & imaging results that were available during my care of the patient were reviewed by me and considered in my medical decision making (see chart for details).    MDM Rules/Calculators/A&P                      Patient presents to the emergency department for evaluation of persistent rash.  Patient seen 1 week ago and treated with steroids in the emergency department, continued outpatient Benadryl.  Reports that the rash worsened tonight, now spreading over her entire body.  Etiology is unclear.  Initially her rash was described as urticarial, tonight it is more maculopapular.  She does have significant excoriations from scratching.  No sign of superinfection.  She has multiple close contacts and none of them have a similar rash, doubt scabies.  This does not appear to be bacterial in nature.  Morphology is more consistent with a contact dermatitis, will treat with tapering dose of prednisone, continued antihistamines, follow-up with allergist. Final Clinical Impression(s) / ED Diagnoses Final diagnoses:  Allergic reaction, initial encounter    Rx / DC Orders ED Discharge Orders         Ordered    predniSONE (DELTASONE) 20 MG tablet     11/01/19 0341    hydrOXYzine (ATARAX/VISTARIL) 25 MG tablet  Every 6 hours PRN     11/01/19 0341           Orpah Greek, MD 11/01/19 [phone]

## 2019-11-27 ENCOUNTER — Emergency Department (HOSPITAL_COMMUNITY): Payer: Medicaid Other

## 2019-11-27 ENCOUNTER — Emergency Department (HOSPITAL_COMMUNITY)
Admission: EM | Admit: 2019-11-27 | Discharge: 2019-11-28 | Disposition: A | Discharge status: Home / Self Care | Payer: Medicaid Other | Attending: Emergency Medicine | Admitting: Emergency Medicine

## 2019-11-27 ENCOUNTER — Other Ambulatory Visit: Payer: Self-pay

## 2019-11-27 ENCOUNTER — Encounter (HOSPITAL_COMMUNITY): Payer: Self-pay | Admitting: *Deleted

## 2019-11-27 DIAGNOSIS — R0602 Shortness of breath: Secondary | ICD-10-CM | POA: Insufficient documentation

## 2019-11-27 DIAGNOSIS — J45909 Unspecified asthma, uncomplicated: Secondary | ICD-10-CM | POA: Insufficient documentation

## 2019-11-27 DIAGNOSIS — R0789 Other chest pain: Secondary | ICD-10-CM | POA: Insufficient documentation

## 2019-11-27 DIAGNOSIS — L509 Urticaria, unspecified: Secondary | ICD-10-CM

## 2019-11-27 LAB — CBC
HCT: 40.6 % (ref 36.0–46.0)
Hemoglobin: 11.9 g/dL — ABNORMAL LOW (ref 12.0–15.0)
MCH: 21.3 pg — ABNORMAL LOW (ref 26.0–34.0)
MCHC: 29.3 g/dL — ABNORMAL LOW (ref 30.0–36.0)
MCV: 72.6 fL — ABNORMAL LOW (ref 80.0–100.0)
Platelets: 489 10*3/uL — ABNORMAL HIGH (ref 150–400)
RBC: 5.59 MIL/uL — ABNORMAL HIGH (ref 3.87–5.11)
RDW: 16.2 % — ABNORMAL HIGH (ref 11.5–15.5)
WBC: 6.9 10*3/uL (ref 4.0–10.5)
nRBC: 0 % (ref 0.0–0.2)

## 2019-11-27 LAB — BASIC METABOLIC PANEL
Anion gap: 13 (ref 5–15)
BUN: 8 mg/dL (ref 6–20)
CO2: 24 mmol/L (ref 22–32)
Calcium: 9.3 mg/dL (ref 8.9–10.3)
Chloride: 105 mmol/L (ref 98–111)
Creatinine, Ser: 0.95 mg/dL (ref 0.44–1.00)
GFR calc Af Amer: >60 mL/min (ref >60)
GFR calc non Af Amer: >60 mL/min (ref >60)
Glucose, Bld: 77 mg/dL (ref 70–99)
Potassium: 3.7 mmol/L (ref 3.5–5.1)
Sodium: 142 mmol/L (ref 135–145)

## 2019-11-27 LAB — I-STAT BETA HCG BLOOD, ED (MC, WL, AP ONLY): I-stat hCG, quantitative: <5 m[IU]/mL (ref <5)

## 2019-11-27 LAB — TROPONIN I (HIGH SENSITIVITY): Troponin I (High Sensitivity): 2 ng/L (ref <18)

## 2019-11-27 MED ORDER — METHYLPREDNISOLONE SODIUM SUCC 125 MG IJ SOLR
125.0000 mg | Freq: Once | INTRAMUSCULAR | Status: AC
  Administered 2019-11-27: 125 mg via INTRAVENOUS
  Filled 2019-11-27: qty 2

## 2019-11-27 MED ORDER — FAMOTIDINE IN NACL 20-0.9 MG/50ML-% IV SOLN
20.0000 mg | Freq: Once | INTRAVENOUS | Status: AC
  Administered 2019-11-27: 20 mg via INTRAVENOUS
  Filled 2019-11-27: qty 50

## 2019-11-27 MED ORDER — SODIUM CHLORIDE 0.9 % IV BOLUS
1000.0000 mL | Freq: Once | INTRAVENOUS | Status: AC
  Administered 2019-11-27: 1000 mL via INTRAVENOUS

## 2019-11-27 MED ORDER — DIPHENHYDRAMINE HCL 50 MG/ML IJ SOLN
50.0000 mg | Freq: Once | INTRAMUSCULAR | Status: AC
  Administered 2019-11-27: 50 mg via INTRAVENOUS
  Filled 2019-11-27: qty 1

## 2019-11-27 MED ORDER — SODIUM CHLORIDE 0.9% FLUSH: 3.0000 mL | Freq: Once | INTRAVENOUS | Status: DC

## 2019-11-27 NOTE — ED Triage Notes (Signed)
The pt reports that she is haVING AN ALLERGIC RFEACTION TO SOMETHING SHE DOES NOT KNOW WHAt that might be.  Rash and itching over her body  And she has this frequently  But she report that she has  Some difficulty breathing  She has asthma  lmp   Feb 2nd last week

## 2019-11-27 NOTE — ED Provider Notes (Signed)
[facility] EMERGENCY DEPARTMENT Provider Note   CSN: [id number] Arrival date & time: 11/27/19  2049     History Chief Complaint  Patient presents with  . Allergic Reaction    Glenda Mendez is a 29 y.o. female history of known allergic reactions with eczema, ADHD, here presenting with rash, possible allergic reaction. Patient states that she has recurrent allergic reactions unclear what the etiologies are.  She states that she see a allergist but they never figured out what she is allergic to.  She has eczema and she does have a cream that she uses for that.  He states that for the last 2 to 3 days, she noticed worsening rash and itchiness.  Patient states that today she has some chest tightness so she came in for evaluation.  Patient was seen here about a month ago for similar symptoms improved with a course of steroids and Benadryl and Pepcid.  She denies any new meds or foods or detergents.  The history is provided by the patient.       Past Medical History:  Diagnosis Date  . ADHD (attention deficit hyperactivity disorder)   . Allergy   . Asthma   . BMI (body mass index), pediatric, 95-99% for age 23/10/2010   Is starting to gain weight again and advised and counseled today about reduction for health risk reasons. Patient is very well aware of how to do lifestyle intervention she has done this before.   Marland Kitchen Hearing loss in right ear    with hearing aid bilateral   . Hemiparesis ([place])    rt from neonatal period  . Hypoxia of newborn   . Irregular periods 06/04/2011  . PCOS (polycystic ovarian syndrome)   . Pulmonary hemorrhage of fetus or newborn    58 week C-section [facility] of with birth hypoxia    Patient Active Problem List   Diagnosis Date Noted  . Impingement of left ankle joint 12/02/2017  . Left ankle pain 06/23/2017  . Ganglion cyst of flexor tendon sheath of finger of right hand 02/02/2017  . Trigger finger, right middle finger 02/02/2017  . Eczema  07/11/2016  . Dyspnea and respiratory abnormality 05/08/2016  . Elevated testosterone level in female 08/28/2015  . Elevated hemoglobin A1c 08/28/2015  . Abdominal pain, right upper quadrant 06/05/2015  . Abnormal urine 06/05/2015  . Alkaline phosphatase elevation 04/27/2015  . Severe obesity (BMI >= 40) ([place]) 12/19/2014  . Asthma with acute exacerbation 08/29/2014  . Moderate intermittent asthma, uncomplicated 99991111  . Rash, skin 06/20/2014  . Moderate persistent asthma with acute exacerbation in adult 05/27/2014  . Anterior chest wall pain 05/27/2014  . Low grade squamous intraepithelial lesion (LGSIL) on cervical Pap smear 12/02/2013  . Obesity (BMI 30-39.9) 11/28/2013  . Visit for preventive health examination 11/28/2013  . Encounter for routine gynecological examination 11/28/2013  . Weight gain 11/28/2013  . Asthma 11/28/2013  . Moderate persistent asthma without complication 123456  . Hemiparesis ([place])   . Oral contraceptive pill surveillance 04/25/2012  . Headache 06/04/2011  . Irregular periods 06/04/2011  . Acne 06/04/2011  . BCP (birth control pills) initiation 06/04/2011  . Preventative health care 02/18/2011  . Neoplasm of uncertain behavior of skin 02/18/2011  . BMI (body mass index), pediatric, 95-99% for age 14/10/2010  . Lumbar back pain 09/17/2010  . NECK PAIN 01/02/2009  . FATIGUE 12/06/2008  . Attention deficit hyperactivity disorder (ADHD) 09/03/2007  . UNSPECIFIED HEARING LOSS 09/03/2007  . ALLERGIC RHINITIS  05/19/2007  . ASTHMA 05/19/2007    Past Surgical History:  Procedure Laterality Date  . TONSILLECTOMY       OB History   No obstetric history on file.     Family History  Adopted: Yes  Problem Relation Age of Onset  . Hypertension Other   . Schizophrenia Mother        with alcohol drug abuse  . Diabetes Other     Social History   Tobacco Use  . Smoking status: Never Smoker  . Smokeless tobacco: Never Used  Substance Use  Topics  . Alcohol use: No    Alcohol/week: 0.0 standard drinks  . Drug use: No    Home Medications Prior to Admission medications   Medication Sig Start Date End Date Taking? Authorizing Provider  albuterol (PROVENTIL) (2.5 MG/3ML) 0.083% nebulizer solution Take 3 mLs (2.5 mg total) by nebulization every 4 (four) hours as needed for wheezing or shortness of breath. 06/22/19   Hedges, Dellis Filbert, PA-C  albuterol (VENTOLIN HFA) 108 (90 Base) MCG/ACT inhaler Inhale 2 puffs into the lungs every 6 (six) hours as needed for wheezing or shortness of breath. 06/22/19   Hedges, Dellis Filbert, PA-C  betamethasone dipropionate (DIPROLENE) 0.05 % cream Apply 1 application topically 2 (two) times daily.     [provider]  desonide (DESOWEN) 0.05 % cream Apply 1 application topically 2 (two) times daily.     [provider]  diclofenac (VOLTAREN) 75 MG EC tablet Take 1 tablet (75 mg total) by mouth 2 (two) times daily. Take 1 tab bid X 10 days then as needed Patient not taking: Reported on 01/18/2019 11/16/17   Gerda Diss, DO  EPINEPHrine 0.3 mg/0.3 mL IJ SOAJ injection Inject 0.3 mLs (0.3 mg total) into the muscle as needed for anaphylaxis. 10/27/19   Charlann Lange, PA-C  famotidine (PEPCID) 20 MG tablet Take 1 tablet (20 mg total) by mouth 2 (two) times daily. Take twice daily for 3 days, then as needed if symptoms recur 10/27/19   Charlann Lange, PA-C  fluticasone (FLONASE) 50 MCG/ACT nasal spray Place 2 sprays into both nostrils daily. 02/18/17   Nafziger, [name], NP  fluticasone-salmeterol (ADVAIR HFA) 230-21 MCG/ACT inhaler Inhale 2 puffs into the lungs 2 (two) times daily. 08/31/18   Brand Males, MD  hydrOXYzine (ATARAX/VISTARIL) 25 MG tablet Take 1-2 tablets (25-50 mg total) by mouth every 6 (six) hours as needed for itching. 11/01/19   Orpah Greek, MD  ibuprofen (ADVIL,MOTRIN) 800 MG tablet Take 1 tablet (800 mg total) by mouth every 8 (eight) hours as needed. Patient taking  differently: Take 800 mg by mouth every 8 (eight) hours as needed for fever, headache, mild pain, moderate pain or cramping.  01/18/19   Teresa Coombs D, DO  ipratropium (ATROVENT) 0.02 % nebulizer solution USE 1 VIAL BY NEBULIZATION 3 (THREE) TIMES DAILY AS NEEDED FOR WHEEZING. **DUE FOR ANNUAL VISIT June 2019** 06/22/19   Hedges, Dellis Filbert, PA-C  levocetirizine (XYZAL ALLERGY 24HR) 5 MG tablet Take 1 tablet (5 mg total) by mouth every evening. 02/18/17   Nafziger, [name], NP  loratadine (CLARITIN) 10 MG tablet Take 1 tablet (10 mg total) by mouth daily. 08/23/19   Carlisle Cater, PA-C  metFORMIN (GLUCOPHAGE-XR) 500 MG 24 hr tablet Take 1 tablet (500 mg total) by mouth daily with breakfast. 10/19/18 11/01/19  Panosh, Standley Brooking, MD  methocarbamol (ROBAXIN) 500 MG tablet Take 1 tablet (500 mg total) by mouth every 8 (eight) hours as needed for muscle  spasms. 01/18/19   Gerda Diss, DO  methylphenidate 36 MG PO CR tablet TAKE (2) TABLETS BY MOUTH DAILY. Patient taking differently: Take 72 mg by mouth daily.  01/19/19   Panosh, Standley Brooking, MD  Naproxen-Esomeprazole (VIMOVO) 500-20 MG TBEC Take 1 tablet by mouth 2 (two) times daily. Patient not taking: Reported on 01/18/2019 07/23/17   Gerda Diss, DO  omeprazole (PRILOSEC) 20 MG capsule Take 1 capsule (20 mg total) by mouth 2 (two) times daily before a meal. Patient not taking: Reported on 10/26/2019 06/20/14   Panosh, Standley Brooking, MD  pantoprazole (PROTONIX) 40 MG tablet Take 1 tablet (40 mg total) by mouth daily. 11/26/16   Panosh, Standley Brooking, MD  predniSONE (DELTASONE) 20 MG tablet 3 tabs po daily x 3 days, then 2 tabs x 3 days, then 1.5 tabs x 3 days, then 1 tab x 3 days, then 0.5 tabs x 3 days 11/01/19   Orpah Greek, MD  Vitamin D, Ergocalciferol, (DRISDOL) 1.25 MG (50000 UT) CAPS capsule Take 1 capsule (50,000 Units total) by mouth every 7 (seven) days. 08/25/18   Panosh, Standley Brooking, MD    Allergies    Patient has no known allergies.  Review of Systems     Review of Systems  Respiratory: Positive for shortness of breath.   Skin: Positive for rash.  All other systems reviewed and are negative.   Physical Exam Updated Vital Signs BP (!) 135/110 (BP Location: Right Arm)   Pulse (!) 120   Temp 99 F (37.2 C) (Oral)   Resp (!) 21   Ht 5\' 7"  (1.702 m)   Wt 127 kg   LMP 11/22/2019   SpO2 100%   BMI 43.85 kg/m   Physical Exam Vitals and nursing note reviewed.  Constitutional:      Appearance: Normal appearance.  HENT:     Head: Normocephalic.     Nose: Nose normal.     Mouth/Throat:     Mouth: Mucous membranes are dry.  Eyes:     Extraocular Movements: Extraocular movements intact.     Pupils: Pupils are equal, round, and reactive to light.  Neck:     Comments: No stridor  Cardiovascular:     Rate and Rhythm: Regular rhythm. Tachycardia present.     Pulses: Normal pulses.  Pulmonary:     Breath sounds: Normal breath sounds.     Comments: Slightly tachypneic  Abdominal:     General: Abdomen is flat.     Palpations: Abdomen is soft.  Musculoskeletal:        General: Normal range of motion.  Skin:    General: Skin is warm.     Capillary Refill: Capillary refill takes less than 2 seconds.     Comments: Diffuse urticaria on arms and legs and torso, no involvement of the mouth or mucous membranes   Neurological:     General: No focal deficit present.     Mental Status: She is alert.  Psychiatric:        Mood and Affect: Mood normal.     ED Results / Procedures / Treatments   Labs (all labs ordered are listed, but only abnormal results are displayed) Labs Reviewed  CBC - Abnormal; Notable for the following components:      Result Value   RBC 5.59 (*)    Hemoglobin 11.9 (*)    MCV 72.6 (*)    MCH 21.3 (*)    MCHC 29.3 (*)  RDW 16.2 (*)    Platelets 489 (*)    All other components within normal limits  BASIC METABOLIC PANEL  I-STAT BETA HCG BLOOD, ED ([facility], [facility], [facility] ONLY)  TROPONIN I (HIGH SENSITIVITY)  TROPONIN  I (HIGH SENSITIVITY)    EKG EKG Interpretation  Date/Time:  Sunday November 27 2019 21:58:10 EST Ventricular Rate:  88 PR Interval:    QRS Duration: 81 QT Interval:  369 QTC Calculation: 447 R Axis:   76 Text Interpretation: Sinus rhythm Borderline short PR interval Borderline T abnormalities, diffuse leads No significant change since last tracing Confirmed by Wandra Arthurs [phone]) on 11/27/2019 10:03:42 PM   Radiology DG Chest 2 View  Result Date: 11/27/2019 CLINICAL DATA:  Allergic reaction. EXAM: CHEST - 2 VIEW COMPARISON:  May 18, 2019 FINDINGS: The heart size and mediastinal contours are within normal limits. Both lungs are clear. The visualized skeletal structures are unremarkable. IMPRESSION: No active cardiopulmonary disease. Electronically Signed   By: Virgina Norfolk M.D.   On: 11/27/2019 21:21    Procedures Procedures (including critical care time)  Medications Ordered in ED Medications  sodium chloride flush (NS) 0.9 % injection 3 mL (has no administration in time range)  methylPREDNISolone sodium succinate (SOLU-MEDROL) 125 mg/2 mL injection 125 mg (has no administration in time range)  diphenhydrAMINE (BENADRYL) injection 50 mg (has no administration in time range)  famotidine (PEPCID) IVPB 20 mg premix (has no administration in time range)  sodium chloride 0.9 % bolus 1,000 mL (has no administration in time range)    ED Course  I have reviewed the triage vital signs and the nursing notes.  Pertinent labs & imaging results that were available during my care of the patient were reviewed by me and considered in my medical decision making (see chart for details).    MDM Rules/Calculators/A&P                      Glenda Mendez is a 29 y.o. female here with rash, shortness of breath.  She has no new meds or no new shampoos or detergents.  She has seen an allergist in the past and never figure out what she is allergic from .  She does have some diffuse rash no rash  in the oropharynx or involving mucous membranes.  She has some subjective shortness of breath for her lungs are clear and she has no stridor.  Will give Benadryl and Solu-Medrol and Pepcid and reassess.  11:12 PM Labs unremarkable. Didn't receive meds yet. Signed out to Dr. Leonides Schanz to reassess after meds. Anticipate dc home with steroids. No signs of SJS. She has chronic eczema and rash that got worse.    Final Clinical Impression(s) / ED Diagnoses Final diagnoses:  None    Rx / DC Orders ED Discharge Orders    None       Drenda Freeze, MD 11/27/19 2313

## 2019-11-27 NOTE — ED Provider Notes (Signed)
11:15 PM  Assumed care from Dr. Darl Householder.  Patient is a 29 y.o. F with history of previous allergic reaction who presents today with hives and subjective shortness of breath.  Labs initially obtained for chest pain work-up which have been unremarkable.  Chest x-ray clear.  Plan is to reassess after IV medications.  12:35 AM Pt reports feeling better.  Resting comfortably.  Vital signs have improved.  Will discharge on prednisone taper.  Recommended allergy follow-up.   At this time, I do not feel there is any life-threatening condition present. I have reviewed, interpreted and discussed all results (EKG, imaging, lab, urine as appropriate) and exam findings with patient/family. I have reviewed nursing notes and appropriate previous records.  I feel the patient is safe to be discharged home without further emergent workup and can continue workup as an outpatient as needed. Discussed usual and customary return precautions. Patient/family verbalize understanding and are comfortable with this plan.  Outpatient follow-up has been provided as needed. All questions have been answered.    Ianna Salmela, Delice Bison, DO 11/28/19 [phone]

## 2019-11-27 NOTE — ED Triage Notes (Signed)
Some chest tightness

## 2019-11-28 MED ORDER — PREDNISONE 10 MG (21) PO TBPK: ORAL_TABLET | ORAL | 0 refills | Status: DC

## 2019-11-28 NOTE — Discharge Instructions (Addendum)
Please follow-up with your allergy specialist.

## 2019-11-28 NOTE — ED Notes (Signed)
PIVC removed with intact catheter tip.  

## 2019-11-28 NOTE — ED Notes (Signed)
Pt was discharged from the ED. Pt read and understood discharge paperwork. Pt had vital signs completed. Pt conscious, breathing, and A&Ox4. No distress noted. Pt speaking in complete sentences. Pt brought out of the ED via wheelchair.  

## 2019-11-28 NOTE — ED Notes (Signed)
Pt resting in bed. Pt denies new or worsening complaints. Will continue to monitor. No distress noted. Pt on continuous monitoring via blood pressure, pulse ox, and cardiac monitor.  

## 2019-12-03 ENCOUNTER — Emergency Department (HOSPITAL_COMMUNITY)
Admission: EM | Admit: 2019-12-03 | Discharge: 2019-12-04 | Disposition: A | Discharge status: Home / Self Care | Payer: Medicaid Other | Attending: Emergency Medicine | Admitting: Emergency Medicine

## 2019-12-03 ENCOUNTER — Encounter (HOSPITAL_COMMUNITY): Payer: Self-pay | Admitting: Emergency Medicine

## 2019-12-03 ENCOUNTER — Other Ambulatory Visit: Payer: Self-pay

## 2019-12-03 DIAGNOSIS — R0602 Shortness of breath: Secondary | ICD-10-CM

## 2019-12-03 DIAGNOSIS — J45909 Unspecified asthma, uncomplicated: Secondary | ICD-10-CM | POA: Insufficient documentation

## 2019-12-03 DIAGNOSIS — Z79899 Other long term (current) drug therapy: Secondary | ICD-10-CM | POA: Insufficient documentation

## 2019-12-03 DIAGNOSIS — R1031 Right lower quadrant pain: Secondary | ICD-10-CM | POA: Insufficient documentation

## 2019-12-03 DIAGNOSIS — U071 COVID-19: Secondary | ICD-10-CM

## 2019-12-03 NOTE — ED Provider Notes (Signed)
[facility] DEPT Provider Note   CSN: [id number] Arrival date & time: 12/03/19  2244     History Chief Complaint  Patient presents with  . Shortness of Breath    Glenda Mendez is a 29 y.o. female.  Patient to ED with complaint of chest tightness and SOB in the setting of +COVID test 12/01/19 and history of asthma. She reports she has a nebulizer machine and inhalers at home that are not providing relief. No fever, headache, loss of taste or smell, nausea, body aches. She does report being "really tired". She reports her COVID test was performed at [facility] ED. She reports she has been given a steroid taper for treatment as well. She continues to feel significant chest tightness and SOB with minimal cough prompting return to ED.  The history is provided by the patient. No language interpreter was used.  Shortness of Breath Associated symptoms: cough   Associated symptoms: no abdominal pain, no chest pain, no fever and no headaches        Past Medical History:  Diagnosis Date  . ADHD (attention deficit hyperactivity disorder)   . Allergy   . Asthma   . BMI (body mass index), pediatric, 95-99% for age 11/20/2010   Is starting to gain weight again and advised and counseled today about reduction for health risk reasons. Patient is very well aware of how to do lifestyle intervention she has done this before.   Marland Kitchen Hearing loss in right ear    with hearing aid bilateral   . Hemiparesis ([place])    rt from neonatal period  . Hypoxia of newborn   . Irregular periods 06/04/2011  . PCOS (polycystic ovarian syndrome)   . Pulmonary hemorrhage of fetus or newborn    35 week C-section [facility] of with birth hypoxia    Patient Active Problem List   Diagnosis Date Noted  . Impingement of left ankle joint 12/02/2017  . Left ankle pain 06/23/2017  . Ganglion cyst of flexor tendon sheath of finger of right hand 02/02/2017  . Trigger finger, right middle finger 02/02/2017  .  Eczema 07/11/2016  . Dyspnea and respiratory abnormality 05/08/2016  . Elevated testosterone level in female 08/28/2015  . Elevated hemoglobin A1c 08/28/2015  . Abdominal pain, right upper quadrant 06/05/2015  . Abnormal urine 06/05/2015  . Alkaline phosphatase elevation 04/27/2015  . Severe obesity (BMI >= 40) ([place]) 12/19/2014  . Asthma with acute exacerbation 08/29/2014  . Moderate intermittent asthma, uncomplicated [id number]  . Rash, skin 06/20/2014  . Moderate persistent asthma with acute exacerbation in adult 05/27/2014  . Anterior chest wall pain 05/27/2014  . Low grade squamous intraepithelial lesion (LGSIL) on cervical Pap smear 12/02/2013  . Obesity (BMI 30-39.9) 11/28/2013  . Visit for preventive health examination 11/28/2013  . Encounter for routine gynecological examination 11/28/2013  . Weight gain 11/28/2013  . Asthma 11/28/2013  . Moderate persistent asthma without complication [id number]  . Hemiparesis ([place])   . Oral contraceptive pill surveillance 04/25/2012  . Headache 06/04/2011  . Irregular periods 06/04/2011  . Acne 06/04/2011  . BCP (birth control pills) initiation 06/04/2011  . Preventative health care 02/18/2011  . Neoplasm of uncertain behavior of skin 02/18/2011  . BMI (body mass index), pediatric, 95-99% for age 41/10/2010  . Lumbar back pain 09/17/2010  . NECK PAIN 01/02/2009  . FATIGUE 12/06/2008  . Attention deficit hyperactivity disorder (ADHD) 09/03/2007  . UNSPECIFIED HEARING LOSS 09/03/2007  . ALLERGIC RHINITIS 05/19/2007  . ASTHMA  05/19/2007    Past Surgical History:  Procedure Laterality Date  . TONSILLECTOMY       OB History   No obstetric history on file.     Family History  Adopted: Yes  Problem Relation Age of Onset  . Hypertension Other   . Schizophrenia Mother        with alcohol drug abuse  . Diabetes Other     Social History   Tobacco Use  . Smoking status: Never Smoker  . Smokeless tobacco: Never Used    Substance Use Topics  . Alcohol use: No    Alcohol/week: 0.0 standard drinks  . Drug use: No    Home Medications Prior to Admission medications   Medication Sig Start Date End Date Taking? Authorizing Provider  albuterol (PROVENTIL) (2.5 MG/3ML) 0.083% nebulizer solution Take 3 mLs (2.5 mg total) by nebulization every 4 (four) hours as needed for wheezing or shortness of breath. 06/22/19   Hedges, Dellis Filbert, PA-C  albuterol (VENTOLIN HFA) 108 (90 Base) MCG/ACT inhaler Inhale 2 puffs into the lungs every 6 (six) hours as needed for wheezing or shortness of breath. 06/22/19   Hedges, Dellis Filbert, PA-C  EPINEPHrine 0.3 mg/0.3 mL IJ SOAJ injection Inject 0.3 mLs (0.3 mg total) into the muscle as needed for anaphylaxis. 10/27/19   Charlann Lange, PA-C  fluticasone (FLONASE) 50 MCG/ACT nasal spray Place 2 sprays into both nostrils daily. 02/18/17   Nafziger, [name], NP  fluticasone-salmeterol (ADVAIR HFA) 230-21 MCG/ACT inhaler Inhale 2 puffs into the lungs 2 (two) times daily. 08/31/18   Brand Males, MD  hydrOXYzine (ATARAX/VISTARIL) 25 MG tablet Take 1-2 tablets (25-50 mg total) by mouth every 6 (six) hours as needed for itching. 11/01/19   Orpah Greek, MD  ipratropium (ATROVENT) 0.02 % nebulizer solution USE 1 VIAL BY NEBULIZATION 3 (THREE) TIMES DAILY AS NEEDED FOR WHEEZING. **DUE FOR ANNUAL VISIT June 2019** 06/22/19   Hedges, Dellis Filbert, PA-C  levocetirizine (XYZAL ALLERGY 24HR) 5 MG tablet Take 1 tablet (5 mg total) by mouth every evening. 02/18/17   Nafziger, [name], NP  loratadine (CLARITIN) 10 MG tablet Take 1 tablet (10 mg total) by mouth daily. Patient taking differently: Take 10 mg by mouth daily as needed for allergies.  08/23/19   Carlisle Cater, PA-C  metFORMIN (GLUCOPHAGE-XR) 500 MG 24 hr tablet Take 1 tablet (500 mg total) by mouth daily with breakfast. 10/19/18 11/27/28  Panosh, Standley Brooking, MD  methylphenidate 36 MG PO CR tablet TAKE (2) TABLETS BY MOUTH DAILY. Patient taking differently: Take  72 mg by mouth daily.  01/19/19   Panosh, Standley Brooking, MD  pantoprazole (PROTONIX) 40 MG tablet Take 1 tablet (40 mg total) by mouth daily. 11/26/16   Panosh, Standley Brooking, MD  predniSONE (STERAPRED UNI-PAK 21 TAB) 10 MG (21) TBPK tablet Take as directed 11/28/19   Ward, Delice Bison, DO  Vitamin D, Ergocalciferol, (DRISDOL) 1.25 MG (50000 UT) CAPS capsule Take 1 capsule (50,000 Units total) by mouth every 7 (seven) days. Patient taking differently: Take 50,000 Units by mouth every Monday.  08/25/18   Panosh, Standley Brooking, MD  famotidine (PEPCID) 20 MG tablet Take 1 tablet (20 mg total) by mouth 2 (two) times daily. Take twice daily for 3 days, then as needed if symptoms recur Patient not taking: Reported on 11/28/2019 10/27/19 11/28/19  Charlann Lange, PA-C    Allergies    Patient has no known allergies.  Review of Systems   Review of Systems  Constitutional: Positive for fatigue. Negative  for chills and fever.  HENT: Negative.   Respiratory: Positive for cough, chest tightness and shortness of breath.   Cardiovascular: Negative.  Negative for chest pain.  Gastrointestinal: Negative.  Negative for abdominal pain and nausea.  Genitourinary: Negative for decreased urine volume and dysuria.  Musculoskeletal: Negative.  Negative for myalgias.  Skin: Negative.   Neurological: Negative.  Negative for weakness and headaches.    Physical Exam Updated Vital Signs BP 135/88 (BP Location: Right Arm)   Pulse 92   Temp 98 F (36.7 C) (Oral)   Resp 18   Ht 5\' 7"  (1.702 m)   Wt 127 kg   LMP 11/22/2019   SpO2 99%   BMI 43.85 kg/m   Physical Exam Vitals and nursing note reviewed.  Constitutional:      General: She is not in acute distress.    Appearance: She is well-developed. She is not diaphoretic.  HENT:     Head: Normocephalic.  Cardiovascular:     Rate and Rhythm: Normal rate and regular rhythm.  Pulmonary:     Effort: Pulmonary effort is normal.     Breath sounds: Normal breath sounds. No wheezing, rhonchi  or rales.  Abdominal:     General: Bowel sounds are normal.     Palpations: Abdomen is soft.     Tenderness: There is no abdominal tenderness. There is no guarding or rebound.  Musculoskeletal:        General: Normal range of motion.     Cervical back: Normal range of motion and neck supple.  Skin:    General: Skin is warm and dry.     Findings: No rash.  Neurological:     Mental Status: She is alert and oriented to person, place, and time.     ED Results / Procedures / Treatments   Labs (all labs ordered are listed, but only abnormal results are displayed) Labs Reviewed  LACTIC ACID, PLASMA  LACTIC ACID, PLASMA  CBC WITH DIFFERENTIAL/PLATELET  COMPREHENSIVE METABOLIC PANEL  D-DIMER, QUANTITATIVE (NOT AT [facility])  PROCALCITONIN  LACTATE DEHYDROGENASE  FERRITIN  TRIGLYCERIDES  FIBRINOGEN  C-REACTIVE PROTEIN  I-STAT BETA HCG BLOOD, ED ([facility], [facility], [facility] ONLY)    EKG None  Radiology No results found.  Procedures Procedures (including critical care time)  Medications Ordered in ED Medications - No data to display  ED Course  I have reviewed the triage vital signs and the nursing notes.  Pertinent labs & imaging results that were available during my care of the patient were reviewed by me and considered in my medical decision making (see chart for details).  Clinical Course as of Dec 04 399  Sun Dec 04, 2019  0225 D-dimer elevated. With symptoms of SOB, chest tightness feel CTA r/o PE is appropriate despite known d-dimer elevations with COVID w/o clot.    [SU]    Clinical Course User Index [SU] Charlann Lange, PA-C   MDM Rules/Calculators/A&P                      Patient to ED with chest tightness and SOB despite home treatment for known COVID infection.   The patient overall nontoxic in appearance. O2 saturation while lying still is upper 90's. She briefly desaturated to 85% but recovered quickly and denied SOB.   She is given an albuterol/atrovent nebulizer  treatment and reports feeling much better. CTA ordered but is a degraded study with motion. No large vessel clot identified. She is ambulated again with  pulse ox and oxygen levels remained above 90%.   Feel she is appropriate for discharge home. Recommend close follow up with PCP for recheck.     Final Clinical Impression(s) / ED Diagnoses Final diagnoses:  None   1. Known COVID infection 2. SOB  Rx / DC Orders ED Discharge Orders    None       Charlann Lange, PA-C 12/04/19 0404    Merryl Hacker, MD 12/04/19 [phone]

## 2019-12-03 NOTE — ED Triage Notes (Signed)
Pt reports shortness of breath and chest tightness for the last few days. Pt confirmed positive for COVID on 12/01/2019 from [facility].

## 2019-12-03 NOTE — ED Notes (Signed)
RN failed twice to start an IV. Will Call IV team.

## 2019-12-04 ENCOUNTER — Emergency Department (HOSPITAL_COMMUNITY): Payer: Medicaid Other

## 2019-12-04 LAB — CBC WITH DIFFERENTIAL/PLATELET
Abs Immature Granulocytes: 0.05 10*3/uL (ref 0.00–0.07)
Basophils Absolute: 0 10*3/uL (ref 0.0–0.1)
Basophils Relative: 0 %
Eosinophils Absolute: 0.4 10*3/uL (ref 0.0–0.5)
Eosinophils Relative: 4 %
HCT: 39.3 % (ref 36.0–46.0)
Hemoglobin: 11.9 g/dL — ABNORMAL LOW (ref 12.0–15.0)
Immature Granulocytes: 1 %
Lymphocytes Relative: 41 %
Lymphs Abs: 3.9 10*3/uL (ref 0.7–4.0)
MCH: 21.8 pg — ABNORMAL LOW (ref 26.0–34.0)
MCHC: 30.3 g/dL (ref 30.0–36.0)
MCV: 71.8 fL — ABNORMAL LOW (ref 80.0–100.0)
Monocytes Absolute: 0.7 10*3/uL (ref 0.1–1.0)
Monocytes Relative: 7 %
Neutro Abs: 4.5 10*3/uL (ref 1.7–7.7)
Neutrophils Relative %: 47 %
Platelets: 451 10*3/uL — ABNORMAL HIGH (ref 150–400)
RBC: 5.47 MIL/uL — ABNORMAL HIGH (ref 3.87–5.11)
RDW: 16.3 % — ABNORMAL HIGH (ref 11.5–15.5)
WBC: 9.6 10*3/uL (ref 4.0–10.5)
nRBC: 0 % (ref 0.0–0.2)

## 2019-12-04 LAB — I-STAT BETA HCG BLOOD, ED (MC, WL, AP ONLY): I-stat hCG, quantitative: <5 m[IU]/mL (ref <5)

## 2019-12-04 LAB — COMPREHENSIVE METABOLIC PANEL
ALT: 22 U/L (ref 0–44)
AST: 17 U/L (ref 15–41)
Albumin: 3.7 g/dL (ref 3.5–5.0)
Alkaline Phosphatase: 81 U/L (ref 38–126)
Anion gap: 10 (ref 5–15)
BUN: 10 mg/dL (ref 6–20)
CO2: 21 mmol/L — ABNORMAL LOW (ref 22–32)
Calcium: 9 mg/dL (ref 8.9–10.3)
Chloride: 107 mmol/L (ref 98–111)
Creatinine, Ser: 0.8 mg/dL (ref 0.44–1.00)
GFR calc Af Amer: >60 mL/min (ref >60)
GFR calc non Af Amer: >60 mL/min (ref >60)
Glucose, Bld: 96 mg/dL (ref 70–99)
Potassium: 3.7 mmol/L (ref 3.5–5.1)
Sodium: 138 mmol/L (ref 135–145)
Total Bilirubin: 0.5 mg/dL (ref 0.3–1.2)
Total Protein: 7.2 g/dL (ref 6.5–8.1)

## 2019-12-04 LAB — C-REACTIVE PROTEIN: CRP: 0.7 mg/dL (ref <1.0)

## 2019-12-04 LAB — FERRITIN: Ferritin: 11 ng/mL (ref 11–307)

## 2019-12-04 LAB — LACTATE DEHYDROGENASE: LDH: 272 U/L — ABNORMAL HIGH (ref 98–192)

## 2019-12-04 LAB — D-DIMER, QUANTITATIVE: D-Dimer, Quant: 0.61 ug/mL-FEU — ABNORMAL HIGH (ref 0.00–0.50)

## 2019-12-04 LAB — FIBRINOGEN: Fibrinogen: 364 mg/dL (ref 210–475)

## 2019-12-04 LAB — PROCALCITONIN: Procalcitonin: <0.1 ng/mL

## 2019-12-04 LAB — TRIGLYCERIDES: Triglycerides: 67 mg/dL (ref <150)

## 2019-12-04 LAB — LACTIC ACID, PLASMA: Lactic Acid, Venous: 1.2 mmol/L (ref 0.5–1.9)

## 2019-12-04 MED ORDER — IPRATROPIUM BROMIDE 0.02 % IN SOLN
0.5000 mg | Freq: Once | RESPIRATORY_TRACT | Status: AC
  Administered 2019-12-04: 02:00:00 0.5 mg via RESPIRATORY_TRACT
  Filled 2019-12-04: qty 2.5

## 2019-12-04 MED ORDER — ALBUTEROL SULFATE (2.5 MG/3ML) 0.083% IN NEBU
5.0000 mg | INHALATION_SOLUTION | Freq: Once | RESPIRATORY_TRACT | Status: AC
  Administered 2019-12-04: 5 mg via RESPIRATORY_TRACT
  Filled 2019-12-04: qty 6

## 2019-12-04 MED ORDER — SODIUM CHLORIDE (PF) 0.9 % IJ SOLN
INTRAMUSCULAR | Status: AC
  Filled 2019-12-04: qty 50

## 2019-12-04 MED ORDER — IOHEXOL 350 MG/ML SOLN
100.0000 mL | Freq: Once | INTRAVENOUS | Status: AC | PRN
  Administered 2019-12-04: 03:00:00 100 mL via INTRAVENOUS

## 2019-12-04 NOTE — Discharge Instructions (Addendum)
Use your nebulizer machine 3-4 times daily, and your inhaler as needed.   Please follow up with your doctor in 2 days for recheck of symptoms.

## 2019-12-04 NOTE — ED Notes (Signed)
Patient O2 Sat Dropped to 85-86 while ambulating and came back up to 99% while Sitting in the bed.

## 2020-01-27 NOTE — Progress Notes (Deleted)
No chief complaint on file.   HPI: Patient  Glenda Mendez  29 y.o. comes in today for Preventive Health Care visit  And med CDM evla  Health Maintenance  Topic Date Due  . INFLUENZA VACCINE  05/20/2020  . PAP-Cervical Cytology Screening  04/02/2021  . PAP SMEAR-Modifier  04/02/2021  . TETANUS/TDAP  02/26/2027  . HIV Screening  Completed   Health Maintenance Review LIFESTYLE:  Exercise:   Tobacco/ETS: Alcohol:  Sugar beverages: Sleep: Drug use: no HH of  Work:    ROS:  GEN/ HEENT: No fever, significant weight changes sweats headaches vision problems hearing changes, CV/ PULM; No chest pain shortness of breath cough, syncope,edema  change in exercise tolerance. GI /GU: No adominal pain, vomiting, change in bowel habits. No blood in the stool. No significant GU symptoms. SKIN/HEME: ,no acute skin rashes suspicious lesions or bleeding. No lymphadenopathy, nodules, masses.  NEURO/ PSYCH:  No neurologic signs such as weakness numbness. No depression anxiety. IMM/ Allergy: No unusual infections.  Allergy .   REST of 12 system review negative except as per HPI   Past Medical History:  Diagnosis Date  . ADHD (attention deficit hyperactivity disorder)   . Allergy   . Asthma   . BMI (body mass index), pediatric, 95-99% for age 59/10/2010   Is starting to gain weight again and advised and counseled today about reduction for health risk reasons. Patient is very well aware of how to do lifestyle intervention she has done this before.   Marland Kitchen Hearing loss in right ear    with hearing aid bilateral   . Hemiparesis ([place])    rt from neonatal period  . Hypoxia of newborn   . Irregular periods 06/04/2011  . PCOS (polycystic ovarian syndrome)   . Pulmonary hemorrhage of fetus or newborn    34 week C-section [facility] of with birth hypoxia    Past Surgical History:  Procedure Laterality Date  . TONSILLECTOMY      Family History  Adopted: Yes  Problem Relation Age of Onset  .  Hypertension Other   . Schizophrenia Mother        with alcohol drug abuse  . Diabetes Other     Social History   Socioeconomic History  . Marital status: Single    Spouse name: Not on file  . Number of children: Not on file  . Years of education: Not on file  . Highest education level: Not on file  Occupational History  . Not on file  Tobacco Use  . Smoking status: Never Smoker  . Smokeless tobacco: Never Used  Substance and Sexual Activity  . Alcohol use: No    Alcohol/week: 0.0 standard drinks  . Drug use: No  . Sexual activity: Never    Birth control/protection: Pill  Other Topics Concern  . 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 [name]- pulm at [facility] now has a new pulmonary Dr. Now stable and on prn       Thyroid evaluation in the past neck evaluation within normal limits and no thyroid disease.   Social Determinants of Health   Financial Resource Strain:   . Difficulty of Paying Living Expenses:   Food Insecurity:   . Worried About [employer] of  Food in the Last Year:   . [place] in the Last Year:   Transportation Needs:   . Film/video editor (Medical):   Marland Kitchen Lack of Transportation (Non-Medical):   Physical Activity:   . Days of Exercise per Week:   . Minutes of Exercise per Session:   Stress:   . Feeling of Stress :   Social Connections:   . Frequency of Communication with Friends and Family:   . Frequency of Social Gatherings with Friends and Family:   . Attends Religious Services:   . Active Member of Clubs or Organizations:   . Attends Archivist Meetings:   Marland Kitchen Marital Status:     Outpatient Medications Prior to Visit  Medication Sig Dispense Refill  . albuterol (PROVENTIL) (2.5 MG/3ML) 0.083% nebulizer solution Take 3 mLs (2.5 mg total) by  nebulization every 4 (four) hours as needed for wheezing or shortness of breath. 75 mL 2  . albuterol (VENTOLIN HFA) 108 (90 Base) MCG/ACT inhaler Inhale 2 puffs into the lungs every 6 (six) hours as needed for wheezing or shortness of breath. 18 g 3  . EPINEPHrine 0.3 mg/0.3 mL IJ SOAJ injection Inject 0.3 mLs (0.3 mg total) into the muscle as needed for anaphylaxis. 1 each 0  . fluticasone (FLONASE) 50 MCG/ACT nasal spray Place 2 sprays into both nostrils daily. 16 g 6  . fluticasone-salmeterol (ADVAIR HFA) 230-21 MCG/ACT inhaler Inhale 2 puffs into the lungs 2 (two) times daily. 1 Inhaler 5  . hydrOXYzine (ATARAX/VISTARIL) 25 MG tablet Take 1-2 tablets (25-50 mg total) by mouth every 6 (six) hours as needed for itching. 30 tablet 0  . ipratropium (ATROVENT) 0.02 % nebulizer solution USE 1 VIAL BY NEBULIZATION 3 (THREE) TIMES DAILY AS NEEDED FOR WHEEZING. **DUE FOR ANNUAL VISIT June 2019** 62.5 mL 2  . levocetirizine (XYZAL ALLERGY 24HR) 5 MG tablet Take 1 tablet (5 mg total) by mouth every evening. 90 tablet 3  . loratadine (CLARITIN) 10 MG tablet Take 1 tablet (10 mg total) by mouth daily. (Patient taking differently: Take 10 mg by mouth daily as needed for allergies. ) 10 tablet 0  . metFORMIN (GLUCOPHAGE-XR) 500 MG 24 hr tablet Take 1 tablet (500 mg total) by mouth daily with breakfast. 90 tablet 0  . methylphenidate 36 MG PO CR tablet TAKE (2) TABLETS BY MOUTH DAILY. (Patient taking differently: Take 72 mg by mouth daily. ) 60 tablet 0  . pantoprazole (PROTONIX) 40 MG tablet Take 1 tablet (40 mg total) by mouth daily. 30 tablet 0  . predniSONE (STERAPRED UNI-PAK 21 TAB) 10 MG (21) TBPK tablet Take as directed 21 tablet 0  . Vitamin D, Ergocalciferol, (DRISDOL) 1.25 MG (50000 UT) CAPS capsule Take 1 capsule (50,000 Units total) by mouth every 7 (seven) days. (Patient taking differently: Take 50,000 Units by mouth every Monday. ) 8 capsule 0   No facility-administered medications prior to visit.       EXAM:  There were no vitals taken for this visit.  There is no height or weight on file to calculate BMI. Wt Readings from Last 3 Encounters:  12/03/19 279 lb 15.8 oz (127 kg)  11/27/19 279 lb 15.8 oz (127 kg)  11/01/19 280 lb (127 kg)    Physical Exam: Vital signs reviewed [id number] is a well-developed well-nourished alert cooperative    who appearsr stated age in no acute distress.  HEENT: normocephalic atraumatic , Eyes: PERRL EOM's full, conjunctiva clear, Nares: paten,t no deformity  discharge or tenderness., Ears: no deformity EAC's clear TMs with normal landmarks. Mouth: clear OP, no lesions, edema.  Moist mucous membranes. Dentition in adequate repair. NECK: supple without masses, thyromegaly or bruits. CHEST/PULM:  Clear to auscultation and percussion breath sounds equal no wheeze , rales or rhonchi. No chest wall deformities or tenderness. Breast: normal by inspection . No dimpling, discharge, masses, tenderness or discharge . CV: PMI is nondisplaced, S1 S2 no gallops, murmurs, rubs. Peripheral pulses are full without delay.No JVD .  ABDOMEN: Bowel sounds normal nontender  No guard or rebound, no hepato splenomegal no CVA tenderness.  No hernia. Extremtities:  No clubbing cyanosis or edema, no acute joint swelling or redness no focal atrophy NEURO:  Oriented x3, cranial nerves 3-12 appear to be intact, no obvious focal weakness,gait within normal limits no abnormal reflexes or asymmetrical SKIN: No acute rashes normal turgor, color, no bruising or petechiae. PSYCH: Oriented, good eye contact, no obvious depression anxiety, cognition and judgment appear normal. LN: no cervical axillary inguinal adenopathy  Lab Results  Component Value Date   WBC 9.6 12/04/2019   HGB 11.9 (L) 12/04/2019   HCT 39.3 12/04/2019   PLT 451 (H) 12/04/2019   GLUCOSE 96 12/04/2019   CHOL 149 04/02/2018   TRIG 67 12/04/2019   HDL 39.10 04/02/2018   LDLCALC 98 04/02/2018   ALT 22 12/04/2019    AST 17 12/04/2019   NA 138 12/04/2019   K 3.7 12/04/2019   CL 107 12/04/2019   CREATININE 0.80 12/04/2019   BUN 10 12/04/2019   CO2 21 (L) 12/04/2019   TSH 1.82 04/02/2018   HGBA1C 6.2 08/02/2018    BP Readings from Last 3 Encounters:  12/04/19 (!) 148/77  11/28/19 118/60  11/01/19 (!) 143/68    Lab results reviewed with patient   ASSESSMENT AND PLAN:  Discussed the following assessment and plan:  No diagnosis found. No follow-ups on file.  Patient Care Team: Onyx Schirmer, Standley Brooking, MD as PCP - General Everitt Amber, MD (Ophthalmology) Rosealee Albee, MD as Referring Physician (Internal Medicine) Philemon Kingdom, MD as Consulting Physician (Internal Medicine) There are no Patient Instructions on file for this visit.  Standley Brooking. Gissela Bloch M.D.

## 2020-01-30 ENCOUNTER — Encounter: Payer: Medicaid Other | Admitting: Internal Medicine

## 2020-04-16 ENCOUNTER — Encounter: Payer: Medicaid Other | Admitting: Internal Medicine

## 2020-04-25 ENCOUNTER — Ambulatory Visit (INDEPENDENT_AMBULATORY_CARE_PROVIDER_SITE_OTHER): Payer: Self-pay | Admitting: Internal Medicine

## 2020-04-25 ENCOUNTER — Other Ambulatory Visit: Payer: Self-pay

## 2020-04-25 ENCOUNTER — Other Ambulatory Visit (INDEPENDENT_AMBULATORY_CARE_PROVIDER_SITE_OTHER): Payer: Self-pay

## 2020-04-25 ENCOUNTER — Encounter: Payer: Self-pay | Admitting: Internal Medicine

## 2020-04-25 VITALS — BP 134/82 | HR 96 | Temp 97.8°F | Ht 66.0 in | Wt 272.2 lb

## 2020-04-25 DIAGNOSIS — Z8616 Personal history of COVID-19: Secondary | ICD-10-CM

## 2020-04-25 DIAGNOSIS — Z79899 Other long term (current) drug therapy: Secondary | ICD-10-CM

## 2020-04-25 DIAGNOSIS — Z0000 Encounter for general adult medical examination without abnormal findings: Secondary | ICD-10-CM

## 2020-04-25 DIAGNOSIS — E282 Polycystic ovarian syndrome: Secondary | ICD-10-CM

## 2020-04-25 DIAGNOSIS — E8881 Metabolic syndrome: Secondary | ICD-10-CM

## 2020-04-25 DIAGNOSIS — J4520 Mild intermittent asthma, uncomplicated: Secondary | ICD-10-CM

## 2020-04-25 DIAGNOSIS — L309 Dermatitis, unspecified: Secondary | ICD-10-CM

## 2020-04-25 DIAGNOSIS — IMO0001 Reserved for inherently not codable concepts without codable children: Secondary | ICD-10-CM

## 2020-04-25 DIAGNOSIS — E559 Vitamin D deficiency, unspecified: Secondary | ICD-10-CM

## 2020-04-25 LAB — CBC WITH DIFFERENTIAL/PLATELET
Basophils Absolute: 0 10*3/uL (ref 0.0–0.1)
Basophils Relative: 0.5 % (ref 0.0–3.0)
Eosinophils Absolute: 0.4 10*3/uL (ref 0.0–0.7)
Eosinophils Relative: 6.8 % — ABNORMAL HIGH (ref 0.0–5.0)
HCT: 38.8 % (ref 36.0–46.0)
Hemoglobin: 12.2 g/dL (ref 12.0–15.0)
Lymphocytes Relative: 32.9 % (ref 12.0–46.0)
Lymphs Abs: 1.8 10*3/uL (ref 0.7–4.0)
MCHC: 31.5 g/dL (ref 30.0–36.0)
MCV: 68.3 fl — ABNORMAL LOW (ref 78.0–100.0)
Monocytes Absolute: 0.4 10*3/uL (ref 0.1–1.0)
Monocytes Relative: 6.7 % (ref 3.0–12.0)
Neutro Abs: 3 10*3/uL (ref 1.4–7.7)
Neutrophils Relative %: 53.1 % (ref 43.0–77.0)
Platelets: 388 10*3/uL (ref 150.0–400.0)
RBC: 5.67 Mil/uL — ABNORMAL HIGH (ref 3.87–5.11)
RDW: 17.2 % — ABNORMAL HIGH (ref 11.5–15.5)
WBC: 5.6 10*3/uL (ref 4.0–10.5)

## 2020-04-25 LAB — HEPATIC FUNCTION PANEL
ALT: 21 U/L (ref 0–35)
AST: 16 U/L (ref 0–37)
Albumin: 4.1 g/dL (ref 3.5–5.2)
Alkaline Phosphatase: 107 U/L (ref 39–117)
Bilirubin, Direct: 0.1 mg/dL (ref 0.0–0.3)
Total Bilirubin: 0.3 mg/dL (ref 0.2–1.2)
Total Protein: 7.4 g/dL (ref 6.0–8.3)

## 2020-04-25 LAB — BASIC METABOLIC PANEL
BUN: 8 mg/dL (ref 6–23)
CO2: 25 mEq/L (ref 19–32)
Calcium: 8.9 mg/dL (ref 8.4–10.5)
Chloride: 106 mEq/L (ref 96–112)
Creatinine, Ser: 0.83 mg/dL (ref 0.40–1.20)
GFR: 99.37 mL/min (ref >60.00)
Glucose, Bld: 99 mg/dL (ref 70–99)
Potassium: 3.8 mEq/L (ref 3.5–5.1)
Sodium: 138 mEq/L (ref 135–145)

## 2020-04-25 LAB — LIPID PANEL
Cholesterol: 161 mg/dL (ref 0–200)
HDL: 58.2 mg/dL (ref >39.00)
LDL Cholesterol: 95 mg/dL (ref 0–99)
NonHDL: 102.88
Total CHOL/HDL Ratio: 3
Triglycerides: 41 mg/dL (ref 0.0–149.0)
VLDL: 8.2 mg/dL (ref 0.0–40.0)

## 2020-04-25 LAB — TSH: TSH: 1.51 u[IU]/mL (ref 0.35–4.50)

## 2020-04-25 LAB — VITAMIN D 25 HYDROXY (VIT D DEFICIENCY, FRACTURES): VITD: 18.2 ng/mL — ABNORMAL LOW (ref 30.00–100.00)

## 2020-04-25 LAB — HEMOGLOBIN A1C: Hgb A1c MFr Bld: 6 % (ref 4.6–6.5)

## 2020-04-25 MED ORDER — EPINEPHRINE 0.3 MG/0.3ML IJ SOAJ: 0.3000 mg | INTRAMUSCULAR | 0 refills | Status: AC | PRN

## 2020-04-25 MED ORDER — HYDROXYZINE HCL 25 MG PO TABS: 25.0000 mg | ORAL_TABLET | Freq: Four times a day (QID) | ORAL | 1 refills | Status: AC | PRN

## 2020-04-25 NOTE — Addendum Note (Signed)
Addended by: Isaiah Serge D on: 04/25/2020 12:48 PM   Modules accepted: Orders

## 2020-04-25 NOTE — Patient Instructions (Signed)
Continue  Efforts  at healthy eating and weight loss.   This also may help your skin. \ Glad you are doing well.  Will notify you  of labs when available.   Get appt even if way out with Dr R  Pulmonary .   Get appt with [facility] about  Pap and pcos   Will send   Epi pen and  hydroxizine   To your pharmacy.    Health Maintenance, Female Adopting a healthy lifestyle and getting preventive care are important in promoting health and wellness. Ask your health care provider about:  The right schedule for you to have regular tests and exams.  Things you can do on your own to prevent diseases and keep yourself healthy. What should I know about diet, weight, and exercise? Eat a healthy diet   Eat a diet that includes plenty of vegetables, fruits, low-fat dairy products, and lean protein.  Do not eat a lot of foods that are high in solid fats, added sugars, or sodium. Maintain a healthy weight Body mass index (BMI) is used to identify weight problems. It estimates body fat based on height and weight. Your health care provider can help determine your BMI and help you achieve or maintain a healthy weight. Get regular exercise Get regular exercise. This is one of the most important things you can do for your health. Most adults should:  Exercise for at least 150 minutes each week. The exercise should increase your heart rate and make you sweat (moderate-intensity exercise).  Do strengthening exercises at least twice a week. This is in addition to the moderate-intensity exercise.  Spend less time sitting. Even light physical activity can be beneficial. Watch cholesterol and blood lipids Have your blood tested for lipids and cholesterol at 28 years of age, then have this test every 5 years. Have your cholesterol levels checked more often if:  Your lipid or cholesterol levels are high.  You are older than 28 years of age.  You are at high risk for heart disease. What should I know about  cancer screening? Depending on your health history and family history, you may need to have cancer screening at various ages. This may include screening for:  Breast cancer.  Cervical cancer.  Colorectal cancer.  Skin cancer.  Lung cancer. What should I know about heart disease, diabetes, and high blood pressure? Blood pressure and heart disease  High blood pressure causes heart disease and increases the risk of stroke. This is more likely to develop in people who have high blood pressure readings, are of African descent, or are overweight.  Have your blood pressure checked: ? Every 3-5 years if you are 27-4 years of age. ? Every year if you are 6 years old or older. Diabetes Have regular diabetes screenings. This checks your fasting blood sugar level. Have the screening done:  Once every three years after age 51 if you are at a normal weight and have a low risk for diabetes.  More often and at a younger age if you are overweight or have a high risk for diabetes. What should I know about preventing infection? Hepatitis B If you have a higher risk for hepatitis B, you should be screened for this virus. Talk with your health care provider to find out if you are at risk for hepatitis B infection. Hepatitis C Testing is recommended for:  Everyone born from [birth year] through 1964.  Anyone with known risk factors for hepatitis C. Sexually transmitted infections (  STIs)  Get screened for STIs, including gonorrhea and chlamydia, if: ? You are sexually active and are younger than 28 years of age. ? You are older than 28 years of age and your health care provider tells you that you are at risk for this type of infection. ? Your sexual activity has changed since you were last screened, and you are at increased risk for chlamydia or gonorrhea. Ask your health care provider if you are at risk.  Ask your health care provider about whether you are at high risk for HIV. Your health care  provider may recommend a prescription medicine to help prevent HIV infection. If you choose to take medicine to prevent HIV, you should first get tested for HIV. You should then be tested every 3 months for as long as you are taking the medicine. Pregnancy  If you are about to stop having your period (premenopausal) and you may become pregnant, seek counseling before you get pregnant.  Take 400 to 800 micrograms (mcg) of folic acid every day if you become pregnant.  Ask for birth control (contraception) if you want to prevent pregnancy. Osteoporosis and menopause Osteoporosis is a disease in which the bones lose minerals and strength with aging. This can result in bone fractures. If you are 68 years old or older, or if you are at risk for osteoporosis and fractures, ask your health care provider if you should:  Be screened for bone loss.  Take a calcium or vitamin D supplement to lower your risk of fractures.  Be given hormone replacement therapy (HRT) to treat symptoms of menopause. Follow these instructions at home: Lifestyle  Do not use any products that contain nicotine or tobacco, such as cigarettes, e-cigarettes, and chewing tobacco. If you need help quitting, ask your health care provider.  Do not use street drugs.  Do not share needles.  Ask your health care provider for help if you need support or information about quitting drugs. Alcohol use  Do not drink alcohol if: ? Your health care provider tells you not to drink. ? You are pregnant, may be pregnant, or are planning to become pregnant.  If you drink alcohol: ? Limit how much you use to 0-1 drink a day. ? Limit intake if you are breastfeeding.  Be aware of how much alcohol is in your drink. In the [place] one drink equals one 12 oz bottle of beer (355 mL), one 5 oz glass of wine (148 mL), or one 1 oz glass of hard liquor (44 mL). General instructions  Schedule regular health, dental, and eye exams.  Stay current  with your vaccines.  Tell your health care provider if: ? You often feel depressed. ? You have ever been abused or do not feel safe at home. Summary  Adopting a healthy lifestyle and getting preventive care are important in promoting health and wellness.  Follow your health care provider's instructions about healthy diet, exercising, and getting tested or screened for diseases.  Follow your health care provider's instructions on monitoring your cholesterol and blood pressure. This information is not intended to replace advice given to you by your health care provider. Make sure you discuss any questions you have with your health care provider. Document Revised: 09/29/2018 Document Reviewed: 09/29/2018 [employer] Patient Education  2020 [employer].

## 2020-04-25 NOTE — Progress Notes (Signed)
Chief Complaint  Patient presents with  . Annual Exam    Doing okay  . Medication Refill    HPI: Patient  Glenda Mendez  29 y.o. comes in today for Preventive Health Care visit  Last visit was well over a  Year ago  Has asthtma ,sig atopic derm,  pcos,   adhd  Hearing impairment  S/p left foot ankle surgery  Among other  Dx   But doing ok today  Needs refill epipen and  hydroxizine  For itching and risk of allergy reaction  She had very mild covid 19 in February with cough that was not long lasting . Is currently hesitant for the vaccine adhd only taking med as needed  Has some left over  Mood reported as good  At this time   Last gyne check ? Dx pcos  And  Had pap .   Due to go back?  Last visit with me Jan 2020  Health Maintenance  Topic Date Due  . Hepatitis C Screening  Never done  . COVID-19 Vaccine (1) Never done  . INFLUENZA VACCINE  05/20/2020  . PAP-Cervical Cytology Screening  04/02/2021  . PAP SMEAR-Modifier  04/02/2021  . TETANUS/TDAP  02/26/2027  . HIV Screening  Completed   Health Maintenance Review LIFESTYLE:  Exercise:    Walking a lot  Tobacco/ETS:n Alcohol: n Sugar beverages: Sleep:  Plenty  Drug use: no HH of   1  Apt   Pet dog  Work:  Control and instrumentation engineer .  Needs epi pen  And as needed hydroxyzine     Face color changes   Desonide  Some and  Betamethasone  Periods  Ok  .   irreg   ROS: asthma reports well controlled  Skinpigment upperon face using desonide as per dr Sherral Hammers in past  GEN/ HEENT: No fever, significant weight changes sweats headaches vision problems hearing changes, CV/ PULM; No chest pain shortness of breath cough, syncope,edema  change in exercise tolerance. GI /GU: No adominal pain, vomiting, change in bowel habits. No blood in the stool. No significant GU symptoms. SKIN/HEME: ,no acute skin rashes suspicious lesions or bleeding. No lymphadenopathy, nodules, masses.  NEURO/ PSYCH:  No neurologic signs such as  weakness numbness. No depression anxiety. IMM/ Allergy: No unusual infections.  Allergy .   REST of 12 system review negative except as per HPI   Past Medical History:  Diagnosis Date  . ADHD (attention deficit hyperactivity disorder)   . Allergy   . Asthma   . BMI (body mass index), pediatric, 95-99% for age 72/10/2010   Is starting to gain weight again and advised and counseled today about reduction for health risk reasons. Patient is very well aware of how to do lifestyle intervention she has done this before.   Marland Kitchen Hearing loss in right ear    with hearing aid bilateral   . Hemiparesis ([place])    rt from neonatal period  . Hypoxia of newborn   . Irregular periods 06/04/2011  . PCOS (polycystic ovarian syndrome)   . Pulmonary hemorrhage of fetus or newborn    6 week C-section [facility] of with birth hypoxia    Past Surgical History:  Procedure Laterality Date  . TONSILLECTOMY      Family History  Adopted: Yes  Problem Relation Age of Onset  . Hypertension Other   . Schizophrenia Mother        with alcohol drug abuse  .  Diabetes Other     Social History   Socioeconomic History  . Marital status: Single    Spouse name: Not on file  . Number of children: Not on file  . Years of education: Not on file  . Highest education level: Not on file  Occupational History  . Not on file  Tobacco Use  . Smoking status: Never Smoker  . Smokeless tobacco: Never Used  Vaping Use  . Vaping Use: Never used  Substance and Sexual Activity  . Alcohol use: No    Alcohol/week: 0.0 standard drinks  . Drug use: No  . Sexual activity: Never    Birth control/protection: Pill  Other Topics Concern  . Not on file  Social History Narrative   Adopted and raised by family member [name]   [employer] high school  graduate [employer] second semester   Living apt  [employer] other job    Sleep 8+ hours   Prev Dr. [name]- pulm at [facility] now has a new pulmonary Dr. Now stable and on prn         Thyroid evaluation in the past neck evaluation within normal limits and no thyroid disease.   Net tad   Social Determinants of Health   Financial Resource Strain:   . Difficulty of Paying Living Expenses:   Food Insecurity:   . Worried About Charity fundraiser in the Last Year:   . Arboriculturist in the Last Year:   Transportation Needs:   . Film/video editor (Medical):   Marland Kitchen Lack of Transportation (Non-Medical):   Physical Activity:   . Days of Exercise per Week:   . Minutes of Exercise per Session:   Stress:   . Feeling of Stress :   Social Connections:   . Frequency of Communication with Friends and Family:   . Frequency of Social Gatherings with Friends and Family:   . Attends Religious Services:   . Active Member of Clubs or Organizations:   . Attends Archivist Meetings:   Marland Kitchen Marital Status:     Outpatient Medications Prior to Visit  Medication Sig Dispense Refill  . albuterol (PROVENTIL) (2.5 MG/3ML) 0.083% nebulizer solution Take 3 mLs (2.5 mg total) by nebulization every 4 (four) hours as needed for wheezing or shortness of breath. 75 mL 2  . albuterol (VENTOLIN HFA) 108 (90 Base) MCG/ACT inhaler Inhale 2 puffs into the lungs every 6 (six) hours as needed for wheezing or shortness of breath. 18 g 3  . fluticasone (FLONASE) 50 MCG/ACT nasal spray Place 2 sprays into both nostrils daily. 16 g 6  . fluticasone-salmeterol (ADVAIR HFA) 230-21 MCG/ACT inhaler Inhale 2 puffs into the lungs 2 (two) times daily. 1 Inhaler 5  . ipratropium (ATROVENT) 0.02 % nebulizer solution USE 1 VIAL BY NEBULIZATION 3 (THREE) TIMES DAILY AS NEEDED FOR WHEEZING. **DUE FOR ANNUAL VISIT June 2019** 62.5 mL 2  . levocetirizine (XYZAL ALLERGY 24HR) 5 MG tablet Take 1 tablet (5 mg total) by mouth every evening. 90 tablet 3  . loratadine (CLARITIN) 10 MG tablet Take 1 tablet (10 mg total) by mouth daily. (Patient taking differently: Take 10 mg by mouth daily as needed for allergies. )  10 tablet 0  . metFORMIN (GLUCOPHAGE-XR) 500 MG 24 hr tablet Take 1 tablet (500 mg total) by mouth daily with breakfast. 90 tablet 0  . methylphenidate 36 MG PO CR tablet TAKE (2) TABLETS BY MOUTH DAILY. (Patient taking differently: Take 72  mg by mouth daily. ) 60 tablet 0  . VITAMIN D PO Take by mouth. Takes the gummies    . EPINEPHrine 0.3 mg/0.3 mL IJ SOAJ injection Inject 0.3 mLs (0.3 mg total) into the muscle as needed for anaphylaxis. 1 each 0  . hydrOXYzine (ATARAX/VISTARIL) 25 MG tablet Take 1-2 tablets (25-50 mg total) by mouth every 6 (six) hours as needed for itching. 30 tablet 0  . pantoprazole (PROTONIX) 40 MG tablet Take 1 tablet (40 mg total) by mouth daily. (Patient not taking: Reported on 04/25/2020) 30 tablet 0  . predniSONE (STERAPRED UNI-PAK 21 TAB) 10 MG (21) TBPK tablet Take as directed (Patient not taking: Reported on 04/25/2020) 21 tablet 0  . Vitamin D, Ergocalciferol, (DRISDOL) 1.25 MG (50000 UT) CAPS capsule Take 1 capsule (50,000 Units total) by mouth every 7 (seven) days. (Patient not taking: Reported on 04/25/2020) 8 capsule 0   No facility-administered medications prior to visit.     EXAM:  BP 134/82   Pulse 96   Temp 97.8 F (36.6 C) (Temporal)   Ht 5\' 6"  (1.676 m)   Wt 272 lb 3.2 oz (123.5 kg)   SpO2 97%   BMI 43.93 kg/m   Body mass index is 43.93 kg/m. Wt Readings from Last 3 Encounters:  04/25/20 272 lb 3.2 oz (123.5 kg)  12/03/19 279 lb 15.8 oz (127 kg)  11/27/19 279 lb 15.8 oz (127 kg)    Physical Exam: Vital signs reviewed XKG:YJEH is a well-developed well-nourished alert cooperative    who appearsr stated age in no acute distress.  HEENT: normocephalic atraumatic , Eyes: PERRL EOM's full, conjunctiva clear, Nares: paten,t no deformity discharge or tenderness.,Mouth: masked NECK: supple without masses, thyromegaly or bruits. CHEST/PULM:  Clear to auscultation and percussion breath sounds equal no wheeze , rales or rhonchi. No chest wall  deformities or tenderness. Breast: normal by inspection . No dimpling, discharge, masses, tenderness or discharge . CV: PMI is nondisplaced, S1 S2 no gallops, murmurs, rubs. Peripheral pulses are full without delay.No JVD .  ABDOMEN: Bowel sounds normal nontender  No guard or rebound, no hepato splenomegal no CVA tenderness.  No hernia. Extremtities:  No clubbing cyanosis or edema, no acute joint swelling or redness no focal atrophy healed scar left ankle  NEURO:  Oriented x3, cranial nerves 3-12 appear to be intact,  Hearing not tested  Known hearing deficit no obvious focal weakness,gait within normal limits no abnormal reflexes or asymmetrical SKIN: No acute rashes normal turgor, no bruising or petechiae. Dry patches  pigmed  Below eyes on face slight scale  And around neck  Acanthosis and  Old eczema  PSYCH: Oriented, good eye contact, no obvious depression anxiety, cognition and judgment appear normal.today LN: no cervical axillaryadenopathy  Lab Results  Component Value Date   WBC 9.6 12/04/2019   HGB 11.9 (L) 12/04/2019   HCT 39.3 12/04/2019   PLT 451 (H) 12/04/2019   GLUCOSE 96 12/04/2019   CHOL 149 04/02/2018   TRIG 67 12/04/2019   HDL 39.10 04/02/2018   LDLCALC 98 04/02/2018   ALT 22 12/04/2019   AST 17 12/04/2019   NA 138 12/04/2019   K 3.7 12/04/2019   CL 107 12/04/2019   CREATININE 0.80 12/04/2019   BUN 10 12/04/2019   CO2 21 (L) 12/04/2019   TSH 1.82 04/02/2018   HGBA1C 6.2 08/02/2018    BP Readings from Last 3 Encounters:  04/25/20 134/82  12/04/19 (!) 148/77  11/28/19 118/60   ASSESSMENT  AND PLAN:  Discussed the following assessment and plan:    ICD-10-CM   1. Visit for preventive health examination  Z00.00 Lipid panel    Hemoglobin V7Q    Basic metabolic panel    CBC with Differential/Platelet    Hepatic function panel    TSH  2. Medication management  Z79.899 Lipid panel    Hemoglobin I6N    Basic metabolic panel    CBC with Differential/Platelet     Hepatic function panel    TSH  3. Insulin resistance  E88.81 Lipid panel    Hemoglobin G2X    Basic metabolic panel    CBC with Differential/Platelet    Hepatic function panel    TSH  4. Severe obesity (BMI >= 40) (HCC)  E66.01 Lipid panel    Hemoglobin B2W    Basic metabolic panel    CBC with Differential/Platelet    Hepatic function panel    TSH   w complications  pcos working on dec weight  consdier weight managment if geting stuck   5. Eczema, unspecified type  L30.9 Lipid panel    Hemoglobin U1L    Basic metabolic panel    CBC with Differential/Platelet    Hepatic function panel    TSH   seen derm using topical  and emmolient  6. PCOS (polycystic ovarian syndrome)  E28.2 Lipid panel    Hemoglobin K4M    Basic metabolic panel    CBC with Differential/Platelet    Hepatic function panel    TSH   taking 500 metformin per day 100 once causes gi upset  told can take bid dosing trial  7. Vitamin D deficiency  E55.9 VITAMIN D 25 Hydroxy (Vit-D Deficiency, Fractures)   no current vit d intake vits   8. Personal history of covid-19  Z86.16   9. Moderate intermittent asthma, uncomplicated  W10.27    Return for depending on results 6- 12 months . Disc  Continue dec weight  Get pulm and gyne appt in future   Declines need for sti screening  Consider taking metformin bid  For toleration  Patient Care Team: Evelena Masci, Standley Brooking, MD as PCP - General Everitt Amber, MD (Ophthalmology) Philemon Kingdom, MD as Consulting Physician (Internal Medicine) Brand Males, MD as Consulting Physician (Pulmonary Disease) Patient Instructions  Continue  Efforts  at healthy eating and weight loss.   This also may help your skin. \ Glad you are doing well.  Will notify you  of labs when available.   Get appt even if way out with Dr R  Pulmonary .   Get appt with GYNE about  Pap and pcos   Will send   Epi pen and  hydroxizine   To your pharmacy.    Health Maintenance,  Female Adopting a healthy lifestyle and getting preventive care are important in promoting health and wellness. Ask your health care provider about:  The right schedule for you to have regular tests and exams.  Things you can do on your own to prevent diseases and keep yourself healthy. What should I know about diet, weight, and exercise? Eat a healthy diet   Eat a diet that includes plenty of vegetables, fruits, low-fat dairy products, and lean protein.  Do not eat a lot of foods that are high in solid fats, added sugars, or sodium. Maintain a healthy weight Body mass index (BMI) is used to identify weight problems. It estimates body fat based on height and weight. Your health care provider  can help determine your BMI and help you achieve or maintain a healthy weight. Get regular exercise Get regular exercise. This is one of the most important things you can do for your health. Most adults should:  Exercise for at least 150 minutes each week. The exercise should increase your heart rate and make you sweat (moderate-intensity exercise).  Do strengthening exercises at least twice a week. This is in addition to the moderate-intensity exercise.  Spend less time sitting. Even light physical activity can be beneficial. Watch cholesterol and blood lipids Have your blood tested for lipids and cholesterol at 28 years of age, then have this test every 5 years. Have your cholesterol levels checked more often if:  Your lipid or cholesterol levels are high.  You are older than 28 years of age.  You are at high risk for heart disease. What should I know about cancer screening? Depending on your health history and family history, you may need to have cancer screening at various ages. This may include screening for:  Breast cancer.  Cervical cancer.  Colorectal cancer.  Skin cancer.  Lung cancer. What should I know about heart disease, diabetes, and high blood pressure? Blood pressure  and heart disease  High blood pressure causes heart disease and increases the risk of stroke. This is more likely to develop in people who have high blood pressure readings, are of African descent, or are overweight.  Have your blood pressure checked: ? Every 3-5 years if you are 70-63 years of age. ? Every year if you are 3 years old or older. Diabetes Have regular diabetes screenings. This checks your fasting blood sugar level. Have the screening done:  Once every three years after age 44 if you are at a normal weight and have a low risk for diabetes.  More often and at a younger age if you are overweight or have a high risk for diabetes. What should I know about preventing infection? Hepatitis B If you have a higher risk for hepatitis B, you should be screened for this virus. Talk with your health care provider to find out if you are at risk for hepatitis B infection. Hepatitis C Testing is recommended for:  Everyone born from [birth year] through 1964.  Anyone with known risk factors for hepatitis C. Sexually transmitted infections (STIs)  Get screened for STIs, including gonorrhea and chlamydia, if: ? You are sexually active and are younger than 28 years of age. ? You are older than 28 years of age and your health care provider tells you that you are at risk for this type of infection. ? Your sexual activity has changed since you were last screened, and you are at increased risk for chlamydia or gonorrhea. Ask your health care provider if you are at risk.  Ask your health care provider about whether you are at high risk for HIV. Your health care provider may recommend a prescription medicine to help prevent HIV infection. If you choose to take medicine to prevent HIV, you should first get tested for HIV. You should then be tested every 3 months for as long as you are taking the medicine. Pregnancy  If you are about to stop having your period (premenopausal) and you may become pregnant,  seek counseling before you get pregnant.  Take 400 to 800 micrograms (mcg) of folic acid every day if you become pregnant.  Ask for birth control (contraception) if you want to prevent pregnancy. Osteoporosis and menopause Osteoporosis is a disease in  which the bones lose minerals and strength with aging. This can result in bone fractures. If you are 3 years old or older, or if you are at risk for osteoporosis and fractures, ask your health care provider if you should:  Be screened for bone loss.  Take a calcium or vitamin D supplement to lower your risk of fractures.  Be given hormone replacement therapy (HRT) to treat symptoms of menopause. Follow these instructions at home: Lifestyle  Do not use any products that contain nicotine or tobacco, such as cigarettes, e-cigarettes, and chewing tobacco. If you need help quitting, ask your health care provider.  Do not use street drugs.  Do not share needles.  Ask your health care provider for help if you need support or information about quitting drugs. Alcohol use  Do not drink alcohol if: ? Your health care provider tells you not to drink. ? You are pregnant, may be pregnant, or are planning to become pregnant.  If you drink alcohol: ? Limit how much you use to 0-1 drink a day. ? Limit intake if you are breastfeeding.  Be aware of how much alcohol is in your drink. In the [place] one drink equals one 12 oz bottle of beer (355 mL), one 5 oz glass of wine (148 mL), or one 1 oz glass of hard liquor (44 mL). General instructions  Schedule regular health, dental, and eye exams.  Stay current with your vaccines.  Tell your health care provider if: ? You often feel depressed. ? You have ever been abused or do not feel safe at home. Summary  Adopting a healthy lifestyle and getting preventive care are important in promoting health and wellness.  Follow your health care provider's instructions about healthy diet, exercising, and  getting tested or screened for diseases.  Follow your health care provider's instructions on monitoring your cholesterol and blood pressure. This information is not intended to replace advice given to you by your health care provider. Make sure you discuss any questions you have with your health care provider. Document Revised: 09/29/2018 Document Reviewed: 09/29/2018 Elsevier Patient Education  2020 Rowena Jasaun Carn M.D.

## 2020-04-27 NOTE — Progress Notes (Signed)
Lab resujlts stable  no diabetes  but sugar borderline . Cholesterol is good  Kidney liver  thryoid nl  vit d is still low    Please take vit d  every day  1000 - 2000 iu d3 per year Advise  6 mos check a1c at that time

## 2020-09-06 ENCOUNTER — Ambulatory Visit: Payer: Medicaid Other

## 2020-10-05 ENCOUNTER — Encounter: Payer: Self-pay | Admitting: Internal Medicine

## 2020-10-23 ENCOUNTER — Ambulatory Visit: Payer: Medicaid Other

## 2020-11-02 ENCOUNTER — Ambulatory Visit: Payer: Medicaid Other | Admitting: Internal Medicine

## 2020-11-06 ENCOUNTER — Ambulatory Visit: Payer: Medicaid Other | Admitting: Internal Medicine

## 2020-12-03 ENCOUNTER — Telehealth (INDEPENDENT_AMBULATORY_CARE_PROVIDER_SITE_OTHER): Payer: Medicaid Other | Admitting: Family Medicine

## 2020-12-03 DIAGNOSIS — R059 Cough, unspecified: Secondary | ICD-10-CM

## 2020-12-03 MED ORDER — BENZONATATE 100 MG PO CAPS
100.0000 mg | ORAL_CAPSULE | Freq: Three times a day (TID) | ORAL | 0 refills | Status: DC | PRN
Start: 2020-12-03 — End: 2022-01-23

## 2020-12-03 MED ORDER — BENZONATATE 100 MG PO CAPS: 100.0000 mg | ORAL_CAPSULE | Freq: Three times a day (TID) | ORAL | 0 refills | Status: DC | PRN

## 2020-12-03 NOTE — Addendum Note (Signed)
Addended by: Eulas Post on: 12/03/2020 09:18 AM   Modules accepted: Orders

## 2020-12-03 NOTE — Progress Notes (Signed)
Patient ID: Glenda Mendez, female   DOB: 16-Jun-1991, 30 y.o.   MRN: [id number]   This visit type was conducted due to national recommendations for restrictions regarding the COVID-19 pandemic in an effort to limit this patient's exposure and mitigate transmission in our community.   Virtual Visit via Video Note  I connected with Glenda Mendez on 12/03/20 at  8:45 AM EST by a video enabled telemedicine application and verified that I am speaking with the correct person using two identifiers.  Location patient: home Location provider:work or home office Persons participating in the virtual visit: patient, provider  I discussed the limitations of evaluation and management by telemedicine and the availability of in person appointments. The patient expressed understanding and agreed to proceed.   HPI: Glenda Mendez called with onset yesterday of some nasal congestion and some cough.  She does have history of reported asthma and takes regular inhalers.  She is in no respiratory distress.  She had some nausea yesterday and a couple episodes of vomiting last night but none today.  No diarrhea.  No dyspnea at rest.  She states her nasal congestion and cough have been slightly productive of yellow sputum.  She is basically requesting work note to be out today.  She has not had any Covid testing.  No known sick contacts.   ROS: See pertinent positives and negatives per HPI.  Past Medical History:  Diagnosis Date  . ADHD (attention deficit hyperactivity disorder)   . Allergy   . Asthma   . BMI (body mass index), pediatric, 95-99% for age 37/10/2010   Is starting to gain weight again and advised and counseled today about reduction for health risk reasons. Patient is very well aware of how to do lifestyle intervention she has done this before.   Marland Kitchen Hearing loss in right ear    with hearing aid bilateral   . Hemiparesis ([place])    rt from neonatal period  . Hypoxia of newborn   . Irregular periods 06/04/2011   . PCOS (polycystic ovarian syndrome)   . Pulmonary hemorrhage of fetus or newborn    64 week C-section [facility] of with birth hypoxia    Past Surgical History:  Procedure Laterality Date  . TONSILLECTOMY      Family History  Adopted: Yes  Problem Relation Age of Onset  . Hypertension Other   . Schizophrenia Mother        with alcohol drug abuse  . Diabetes Other     SOCIAL HX: Non-smoker   Current Outpatient Medications:  .  benzonatate (TESSALON PERLES) 100 MG capsule, Take 1 capsule (100 mg total) by mouth 3 (three) times daily as needed for cough., Disp: 30 capsule, Rfl: 0 .  albuterol (PROVENTIL) (2.5 MG/3ML) 0.083% nebulizer solution, Take 3 mLs (2.5 mg total) by nebulization every 4 (four) hours as needed for wheezing or shortness of breath., Disp: 75 mL, Rfl: 2 .  albuterol (VENTOLIN HFA) 108 (90 Base) MCG/ACT inhaler, Inhale 2 puffs into the lungs every 6 (six) hours as needed for wheezing or shortness of breath., Disp: 18 g, Rfl: 3 .  EPINEPHrine 0.3 mg/0.3 mL IJ SOAJ injection, Inject 0.3 mLs (0.3 mg total) into the muscle as needed for anaphylaxis., Disp: 1 each, Rfl: 0 .  fluticasone (FLONASE) 50 MCG/ACT nasal spray, Place 2 sprays into both nostrils daily., Disp: 16 g, Rfl: 6 .  fluticasone-salmeterol (ADVAIR HFA) 230-21 MCG/ACT inhaler, Inhale 2 puffs into the lungs 2 (two) times daily., Disp: 1  Inhaler, Rfl: 5 .  hydrOXYzine (ATARAX/VISTARIL) 25 MG tablet, Take 1-2 tablets (25-50 mg total) by mouth every 6 (six) hours as needed for itching., Disp: 30 tablet, Rfl: 1 .  ipratropium (ATROVENT) 0.02 % nebulizer solution, USE 1 VIAL BY NEBULIZATION 3 (THREE) TIMES DAILY AS NEEDED FOR WHEEZING. **DUE FOR ANNUAL VISIT June 2019**, Disp: 62.5 mL, Rfl: 2 .  levocetirizine (XYZAL ALLERGY 24HR) 5 MG tablet, Take 1 tablet (5 mg total) by mouth every evening., Disp: 90 tablet, Rfl: 3 .  loratadine (CLARITIN) 10 MG tablet, Take 1 tablet (10 mg total) by mouth daily. (Patient taking  differently: Take 10 mg by mouth daily as needed for allergies. ), Disp: 10 tablet, Rfl: 0 .  metFORMIN (GLUCOPHAGE-XR) 500 MG 24 hr tablet, Take 1 tablet (500 mg total) by mouth daily with breakfast., Disp: 90 tablet, Rfl: 0 .  methylphenidate 36 MG PO CR tablet, TAKE (2) TABLETS BY MOUTH DAILY. (Patient taking differently: Take 72 mg by mouth daily. ), Disp: 60 tablet, Rfl: 0 .  VITAMIN D PO, Take by mouth. Takes the gummies, Disp: , Rfl:   EXAM:  VITALS per patient if applicable:  GENERAL: alert, oriented, appears well and in no acute distress  HEENT: atraumatic, conjunttiva clear, no obvious abnormalities on inspection of external nose and ears  NECK: normal movements of the head and neck  LUNGS: on inspection no signs of respiratory distress, breathing rate appears normal, no obvious gross SOB, gasping or wheezing  CV: no obvious cyanosis  MS: moves all visible extremities without noticeable abnormality  PSYCH/NEURO: pleasant and cooperative, no obvious depression or anxiety, speech and thought processing grossly intact  ASSESSMENT AND PLAN:  Discussed the following assessment and plan:  1 day history of nasal congestion and cough.  Rule out COVID-19 infection especially in view of her history of asthma.  -Work note written to be out of work until further clarified -Strongly suggest that she go today and get tested for Covid to help clarify -Continue usual inhalers -Send in Tessalon Perles 100 mg every 8 hours as needed for cough     I discussed the assessment and treatment plan with the patient. The patient was provided an opportunity to ask questions and all were answered. The patient agreed with the plan and demonstrated an understanding of the instructions.   The patient was advised to call back or seek an in-person evaluation if the symptoms worsen or if the condition fails to improve as anticipated.     Carolann Littler, MD

## 2020-12-05 ENCOUNTER — Emergency Department (HOSPITAL_COMMUNITY): Payer: Medicaid Other

## 2020-12-05 ENCOUNTER — Emergency Department (HOSPITAL_COMMUNITY)
Admission: EM | Admit: 2020-12-05 | Discharge: 2020-12-05 | Disposition: A | Discharge status: Home / Self Care | Payer: Medicaid Other | Attending: Emergency Medicine | Admitting: Emergency Medicine

## 2020-12-05 ENCOUNTER — Other Ambulatory Visit: Payer: Self-pay

## 2020-12-05 ENCOUNTER — Encounter (HOSPITAL_COMMUNITY): Payer: Self-pay | Admitting: Emergency Medicine

## 2020-12-05 DIAGNOSIS — Z7984 Long term (current) use of oral hypoglycemic drugs: Secondary | ICD-10-CM | POA: Insufficient documentation

## 2020-12-05 DIAGNOSIS — Z7951 Long term (current) use of inhaled steroids: Secondary | ICD-10-CM | POA: Insufficient documentation

## 2020-12-05 DIAGNOSIS — J4541 Moderate persistent asthma with (acute) exacerbation: Secondary | ICD-10-CM | POA: Insufficient documentation

## 2020-12-05 DIAGNOSIS — K219 Gastro-esophageal reflux disease without esophagitis: Secondary | ICD-10-CM | POA: Insufficient documentation

## 2020-12-05 DIAGNOSIS — Z8603 Personal history of neoplasm of uncertain behavior: Secondary | ICD-10-CM | POA: Insufficient documentation

## 2020-12-05 DIAGNOSIS — R1111 Vomiting without nausea: Secondary | ICD-10-CM

## 2020-12-05 LAB — URINALYSIS, ROUTINE W REFLEX MICROSCOPIC
Bilirubin Urine: NEGATIVE
Glucose, UA: NEGATIVE mg/dL
Hgb urine dipstick: NEGATIVE
Ketones, ur: NEGATIVE mg/dL
Leukocytes,Ua: NEGATIVE
Nitrite: POSITIVE — AB
Protein, ur: NEGATIVE mg/dL
Specific Gravity, Urine: 1.02 (ref 1.005–1.030)
pH: 7 (ref 5.0–8.0)

## 2020-12-05 LAB — RAPID URINE DRUG SCREEN, HOSP PERFORMED
Amphetamines: NOT DETECTED
Barbiturates: NOT DETECTED
Benzodiazepines: NOT DETECTED
Cocaine: NOT DETECTED
Opiates: NOT DETECTED
Tetrahydrocannabinol: NOT DETECTED

## 2020-12-05 LAB — URINALYSIS, MICROSCOPIC (REFLEX)

## 2020-12-05 LAB — CBC
HCT: 40.2 % (ref 36.0–46.0)
Hemoglobin: 11.8 g/dL — ABNORMAL LOW (ref 12.0–15.0)
MCH: 21.1 pg — ABNORMAL LOW (ref 26.0–34.0)
MCHC: 29.4 g/dL — ABNORMAL LOW (ref 30.0–36.0)
MCV: 71.8 fL — ABNORMAL LOW (ref 80.0–100.0)
Platelets: 428 10*3/uL — ABNORMAL HIGH (ref 150–400)
RBC: 5.6 MIL/uL — ABNORMAL HIGH (ref 3.87–5.11)
RDW: 15.3 % (ref 11.5–15.5)
WBC: 6.8 10*3/uL (ref 4.0–10.5)
nRBC: 0 % (ref 0.0–0.2)

## 2020-12-05 LAB — COMPREHENSIVE METABOLIC PANEL
ALT: 26 U/L (ref 0–44)
AST: 21 U/L (ref 15–41)
Albumin: 3.4 g/dL — ABNORMAL LOW (ref 3.5–5.0)
Alkaline Phosphatase: 128 U/L — ABNORMAL HIGH (ref 38–126)
Anion gap: 10 (ref 5–15)
BUN: 5 mg/dL — ABNORMAL LOW (ref 6–20)
CO2: 25 mmol/L (ref 22–32)
Calcium: 8.9 mg/dL (ref 8.9–10.3)
Chloride: 105 mmol/L (ref 98–111)
Creatinine, Ser: 0.84 mg/dL (ref 0.44–1.00)
GFR, Estimated: >60 mL/min (ref >60)
Glucose, Bld: 90 mg/dL (ref 70–99)
Potassium: 3.7 mmol/L (ref 3.5–5.1)
Sodium: 140 mmol/L (ref 135–145)
Total Bilirubin: 0.4 mg/dL (ref 0.3–1.2)
Total Protein: 7.1 g/dL (ref 6.5–8.1)

## 2020-12-05 LAB — I-STAT BETA HCG BLOOD, ED (MC, WL, AP ONLY): I-stat hCG, quantitative: <5 m[IU]/mL (ref <5)

## 2020-12-05 LAB — LIPASE, BLOOD: Lipase: 49 U/L (ref 11–51)

## 2020-12-05 MED ORDER — LIDOCAINE VISCOUS HCL 2 % MT SOLN
15.0000 mL | Freq: Once | OROMUCOSAL | Status: AC
  Administered 2020-12-05: 15 mL via ORAL
  Filled 2020-12-05: qty 15

## 2020-12-05 MED ORDER — SODIUM CHLORIDE 0.9 % IV BOLUS
1000.0000 mL | Freq: Once | INTRAVENOUS | Status: AC
  Administered 2020-12-05: 1000 mL via INTRAVENOUS

## 2020-12-05 MED ORDER — PANTOPRAZOLE SODIUM 20 MG PO TBEC: 20.0000 mg | DELAYED_RELEASE_TABLET | Freq: Every day | ORAL | 0 refills | Status: DC

## 2020-12-05 MED ORDER — ONDANSETRON HCL 4 MG/2ML IJ SOLN
4.0000 mg | Freq: Once | INTRAMUSCULAR | Status: AC
  Administered 2020-12-05: 4 mg via INTRAVENOUS
  Filled 2020-12-05: qty 2

## 2020-12-05 MED ORDER — SUCRALFATE 1 G PO TABS: 1.0000 g | ORAL_TABLET | Freq: Three times a day (TID) | ORAL | 0 refills | Status: AC

## 2020-12-05 MED ORDER — ALUM & MAG HYDROXIDE-SIMETH 200-200-20 MG/5ML PO SUSP
30.0000 mL | Freq: Once | ORAL | Status: AC
  Administered 2020-12-05: 30 mL via ORAL
  Filled 2020-12-05: qty 30

## 2020-12-05 MED ORDER — ONDANSETRON HCL 4 MG PO TABS: 4.0000 mg | ORAL_TABLET | Freq: Three times a day (TID) | ORAL | 0 refills | Status: DC | PRN

## 2020-12-05 MED ORDER — CEPHALEXIN 500 MG PO CAPS: 500.0000 mg | ORAL_CAPSULE | Freq: Two times a day (BID) | ORAL | 0 refills | Status: AC

## 2020-12-05 NOTE — ED Triage Notes (Signed)
Pt has been having generalized abd pain for 3 days with n/v. Pt actively vomiting in triage with a dry cough, pt has hx of asthma and this morning was having cheat tightness.

## 2020-12-05 NOTE — ED Provider Notes (Signed)
[facility] EMERGENCY DEPARTMENT Provider Note   CSN: [id number] Arrival date & time: 12/05/20  0945     History Chief Complaint  Patient presents with  . Abdominal Pain    Glenda Mendez is a 30 y.o. female.  The history is provided by the patient.  Abdominal Pain Pain location:  Epigastric Pain quality: bloating and burning   Pain radiates to:  Chest Pain severity:  Mild Onset quality:  Gradual Timing:  Intermittent Progression:  Waxing and waning Chronicity:  New Context: eating   Relieved by:  Nothing Worsened by:  Nothing Associated symptoms: nausea and vomiting   Associated symptoms: no chest pain, no chills, no cough, no dysuria, no fever, no hematuria, no shortness of breath and no sore throat   Risk factors: has not had multiple surgeries and not pregnant        Past Medical History:  Diagnosis Date  . ADHD (attention deficit hyperactivity disorder)   . Allergy   . Asthma   . BMI (body mass index), pediatric, 95-99% for age 55/10/2010   Is starting to gain weight again and advised and counseled today about reduction for health risk reasons. Patient is very well aware of how to do lifestyle intervention she has done this before.   Marland Kitchen Hearing loss in right ear    with hearing aid bilateral   . Hemiparesis ([place])    rt from neonatal period  . Hypoxia of newborn   . Irregular periods 06/04/2011  . PCOS (polycystic ovarian syndrome)   . Pulmonary hemorrhage of fetus or newborn    38 week C-section [facility] of with birth hypoxia    Patient Active Problem List   Diagnosis Date Noted  . Impingement of left ankle joint 12/02/2017  . Left ankle pain 06/23/2017  . Ganglion cyst of flexor tendon sheath of finger of right hand 02/02/2017  . Trigger finger, right middle finger 02/02/2017  . Eczema 07/11/2016  . Dyspnea and respiratory abnormality 05/08/2016  . Elevated testosterone level in female 08/28/2015  . Elevated hemoglobin A1c 08/28/2015  .  Abdominal pain, right upper quadrant 06/05/2015  . Abnormal urine 06/05/2015  . Alkaline phosphatase elevation 04/27/2015  . Severe obesity (BMI >= 40) ([place]) 12/19/2014  . Asthma with acute exacerbation 08/29/2014  . Moderate intermittent asthma, uncomplicated 97/35/3299  . Rash, skin 06/20/2014  . Moderate persistent asthma with acute exacerbation in adult 05/27/2014  . Anterior chest wall pain 05/27/2014  . Low grade squamous intraepithelial lesion (LGSIL) on cervical Pap smear 12/02/2013  . Obesity (BMI 30-39.9) 11/28/2013  . Visit for preventive health examination 11/28/2013  . Encounter for routine gynecological examination 11/28/2013  . Weight gain 11/28/2013  . Asthma 11/28/2013  . Moderate persistent asthma without complication 24/26/8341  . Hemiparesis ([place])   . Oral contraceptive pill surveillance 04/25/2012  . Headache 06/04/2011  . Irregular periods 06/04/2011  . Acne 06/04/2011  . BCP (birth control pills) initiation 06/04/2011  . Preventative health care 02/18/2011  . Neoplasm of uncertain behavior of skin 02/18/2011  . BMI (body mass index), pediatric, 95-99% for age 67/10/2010  . Lumbar back pain 09/17/2010  . NECK PAIN 01/02/2009  . FATIGUE 12/06/2008  . Attention deficit hyperactivity disorder (ADHD) 09/03/2007  . UNSPECIFIED HEARING LOSS 09/03/2007  . ALLERGIC RHINITIS 05/19/2007  . ASTHMA 05/19/2007    Past Surgical History:  Procedure Laterality Date  . TONSILLECTOMY       OB History   No obstetric history on file.  Family History  Adopted: Yes  Problem Relation Age of Onset  . Hypertension Other   . Schizophrenia Mother        with alcohol drug abuse  . Diabetes Other     Social History   Tobacco Use  . Smoking status: Never Smoker  . Smokeless tobacco: Never Used  Vaping Use  . Vaping Use: Never used  Substance Use Topics  . Alcohol use: No    Alcohol/week: 0.0 standard drinks  . Drug use: No    Home Medications Prior to  Admission medications   Medication Sig Start Date End Date Taking? Authorizing Provider  cephALEXin (KEFLEX) 500 MG capsule Take 1 capsule (500 mg total) by mouth 2 (two) times daily for 5 days. 12/05/20 12/10/20 Yes Patsy Varma, DO  ondansetron (ZOFRAN) 4 MG tablet Take 1 tablet (4 mg total) by mouth every 8 (eight) hours as needed for up to 15 doses for nausea or vomiting. 12/05/20  Yes Zev Blue, DO  pantoprazole (PROTONIX) 20 MG tablet Take 1 tablet (20 mg total) by mouth daily for 20 days. 12/05/20 12/25/20 Yes Kaius Daino, DO  sucralfate (CARAFATE) 1 g tablet Take 1 tablet (1 g total) by mouth 4 (four) times daily -  with meals and at bedtime for 10 days. 12/05/20 12/15/20 Yes Estefan Pattison, DO  albuterol (PROVENTIL) (2.5 MG/3ML) 0.083% nebulizer solution Take 3 mLs (2.5 mg total) by nebulization every 4 (four) hours as needed for wheezing or shortness of breath. 06/22/19   Hedges, Dellis Filbert, PA-C  albuterol (VENTOLIN HFA) 108 (90 Base) MCG/ACT inhaler Inhale 2 puffs into the lungs every 6 (six) hours as needed for wheezing or shortness of breath. 06/22/19   Hedges, Dellis Filbert, PA-C  benzonatate (TESSALON PERLES) 100 MG capsule Take 1 capsule (100 mg total) by mouth 3 (three) times daily as needed for cough. 12/03/20   Burchette, Alinda Sierras, MD  EPINEPHrine 0.3 mg/0.3 mL IJ SOAJ injection Inject 0.3 mLs (0.3 mg total) into the muscle as needed for anaphylaxis. 04/25/20   Panosh, Standley Brooking, MD  fluticasone (FLONASE) 50 MCG/ACT nasal spray Place 2 sprays into both nostrils daily. 02/18/17   Nafziger, [name], NP  fluticasone-salmeterol (ADVAIR HFA) 230-21 MCG/ACT inhaler Inhale 2 puffs into the lungs 2 (two) times daily. 08/31/18   Brand Males, MD  hydrOXYzine (ATARAX/VISTARIL) 25 MG tablet Take 1-2 tablets (25-50 mg total) by mouth every 6 (six) hours as needed for itching. 04/25/20   Panosh, Standley Brooking, MD  ipratropium (ATROVENT) 0.02 % nebulizer solution USE 1 VIAL BY NEBULIZATION 3 (THREE) TIMES DAILY AS NEEDED  FOR WHEEZING. **DUE FOR ANNUAL VISIT June 2019** 06/22/19   Hedges, Dellis Filbert, PA-C  levocetirizine (XYZAL ALLERGY 24HR) 5 MG tablet Take 1 tablet (5 mg total) by mouth every evening. 02/18/17   Nafziger, [name], NP  loratadine (CLARITIN) 10 MG tablet Take 1 tablet (10 mg total) by mouth daily. Patient taking differently: Take 10 mg by mouth daily as needed for allergies.  08/23/19   Carlisle Cater, PA-C  metFORMIN (GLUCOPHAGE-XR) 500 MG 24 hr tablet Take 1 tablet (500 mg total) by mouth daily with breakfast. 10/19/18 11/27/28  Panosh, Standley Brooking, MD  methylphenidate 36 MG PO CR tablet TAKE (2) TABLETS BY MOUTH DAILY. Patient taking differently: Take 72 mg by mouth daily.  01/19/19   Panosh, Standley Brooking, MD  VITAMIN D PO Take by mouth. Takes the gummies    [provider]  famotidine (PEPCID) 20 MG tablet Take 1 tablet (20 mg  total) by mouth 2 (two) times daily. Take twice daily for 3 days, then as needed if symptoms recur Patient not taking: Reported on 11/28/2019 10/27/19 11/28/19  Charlann Lange, PA-C    Allergies    Patient has no known allergies.  Review of Systems   Review of Systems  Constitutional: Negative for chills and fever.  HENT: Negative for ear pain and sore throat.   Eyes: Negative for pain and visual disturbance.  Respiratory: Negative for cough and shortness of breath.   Cardiovascular: Negative for chest pain and palpitations.  Gastrointestinal: Positive for abdominal pain, nausea and vomiting.  Genitourinary: Negative for dysuria and hematuria.  Musculoskeletal: Negative for arthralgias and back pain.  Skin: Negative for color change and rash.  Neurological: Negative for seizures and syncope.  All other systems reviewed and are negative.   Physical Exam Updated Vital Signs BP 125/76   Pulse 72   Temp 98.2 F (36.8 C) (Oral)   Resp 15   SpO2 100%   Physical Exam Vitals and nursing note reviewed.  Constitutional:      General: She is not in acute distress.    Appearance:  She is well-developed and well-nourished. She is not ill-appearing.  HENT:     Head: Normocephalic and atraumatic.  Eyes:     Extraocular Movements: Extraocular movements intact.     Conjunctiva/sclera: Conjunctivae normal.     Pupils: Pupils are equal, round, and reactive to light.  Cardiovascular:     Rate and Rhythm: Normal rate and regular rhythm.     Heart sounds: Normal heart sounds. No murmur heard.   Pulmonary:     Effort: Pulmonary effort is normal. No respiratory distress.     Breath sounds: Normal breath sounds.  Abdominal:     Palpations: Abdomen is soft.     Tenderness: There is abdominal tenderness in the epigastric area. There is no guarding or rebound.     Hernia: No hernia is present.  Musculoskeletal:        General: No edema.     Cervical back: Neck supple.  Skin:    General: Skin is warm and dry.  Neurological:     Mental Status: She is alert.  Psychiatric:        Mood and Affect: Mood and affect normal.     ED Results / Procedures / Treatments   Labs (all labs ordered are listed, but only abnormal results are displayed) Labs Reviewed  COMPREHENSIVE METABOLIC PANEL - Abnormal; Notable for the following components:      Result Value   BUN 5 (*)    Albumin 3.4 (*)    Alkaline Phosphatase 128 (*)    All other components within normal limits  CBC - Abnormal; Notable for the following components:   RBC 5.60 (*)    Hemoglobin 11.8 (*)    MCV 71.8 (*)    MCH 21.1 (*)    MCHC 29.4 (*)    Platelets 428 (*)    All other components within normal limits  URINALYSIS, ROUTINE W REFLEX MICROSCOPIC - Abnormal; Notable for the following components:   APPearance HAZY (*)    Nitrite POSITIVE (*)    All other components within normal limits  URINALYSIS, MICROSCOPIC (REFLEX) - Abnormal; Notable for the following components:   Bacteria, UA FEW (*)    All other components within normal limits  URINE CULTURE  LIPASE, BLOOD  RAPID URINE DRUG SCREEN, HOSP PERFORMED   I-STAT BETA HCG BLOOD, ED ([facility], [facility],  [facility] ONLY)    EKG EKG Interpretation  Date/Time:  Wednesday December 05 2020 10:00:12 EST Ventricular Rate:  92 PR Interval:  120 QRS Duration: 86 QT Interval:  352 QTC Calculation: 435 R Axis:   78 Text Interpretation: Normal sinus rhythm Nonspecific T wave abnormality Abnormal ECG Confirmed by Lennice Sites [phone]) on 12/05/2020 11:05:34 AM   Radiology DG Chest Portable 1 View  Result Date: 12/05/2020 CLINICAL DATA:  Cough, vomiting, chest tightness.  History of asthma EXAM: PORTABLE CHEST 1 VIEW COMPARISON:  11/27/2019 FINDINGS: The heart size and mediastinal contours are within normal limits. Low lung volumes. No focal airspace consolidation, pleural effusion, or pneumothorax. The visualized skeletal structures are unremarkable. IMPRESSION: No active disease. Electronically Signed   By: Davina Poke D.O.   On: 12/05/2020 12:11    Procedures Procedures   Medications Ordered in ED Medications  ondansetron (ZOFRAN) injection 4 mg (4 mg Intravenous Given 12/05/20 1130)  sodium chloride 0.9 % bolus 1,000 mL (0 mLs Intravenous Stopped 12/05/20 1310)  alum & mag hydroxide-simeth (MAALOX/MYLANTA) 200-200-20 MG/5ML suspension 30 mL (30 mLs Oral Given 12/05/20 1130)    And  lidocaine (XYLOCAINE) 2 % viscous mouth solution 15 mL (15 mLs Oral Given 12/05/20 1130)    ED Course  I have reviewed the triage vital signs and the nursing notes.  Pertinent labs & imaging results that were available during my care of the patient were reviewed by me and considered in my medical decision making (see chart for details).    MDM Rules/Calculators/A&P                          Jeaneane ESPERANSA SARABIA is a 30 year old female with history of asthma who presents the ED with abdominal pain, nausea, vomiting.  Patient with overall unremarkable vitals.  Symptoms for the last several days on and off.  Appears to be may be worse with eating.  Denies any alcohol or drug use.   Discomfort in the upper abdomen radiating into her chest.  Seems to be consistent with reflux/gastritis.  Denies any melena.  No lower abdominal pain.  No suspicion for ovarian pathology such as torsion or appendicitis or other intra-abdominal process.  Could be pancreatitis versus cholecystitis as well.  Will get lab work.  Will give GI cocktail, fluid bolus, IV Zofran.  No significant anemia, electrolyte abnormality, kidney injury. UDS negative for marijuana.hyperemesis. No significant leukocytosis. Urinalysis overall equivocal for infection and will send culture. However will start antibiotics given positive nitrates and few bacteria. Will treat for reflux with Carafate and Protonix. Will prescribe Zofran as well. Suspect gastritis/reflux. Discharged in good condition. Understands return precautions.  This chart was dictated using voice recognition software.  Despite best efforts to proofread,  errors can occur which can change the documentation meaning.    Final Clinical Impression(s) / ED Diagnoses Final diagnoses:  Vomiting without nausea, intractability of vomiting not specified, unspecified vomiting type  Gastroesophageal reflux disease, unspecified whether esophagitis present    Rx / DC Orders ED Discharge Orders         Ordered    cephALEXin (KEFLEX) 500 MG capsule  2 times daily        12/05/20 1413    sucralfate (CARAFATE) 1 g tablet  3 times daily with meals & bedtime        12/05/20 1413    pantoprazole (PROTONIX) 20 MG tablet  Daily        12/05/20  1413    ondansetron (ZOFRAN) 4 MG tablet  Every 8 hours PRN        12/05/20 1413           Lennice Sites, DO 12/05/20 1413

## 2020-12-06 ENCOUNTER — Telehealth: Payer: Self-pay

## 2020-12-06 NOTE — Telephone Encounter (Signed)
Transition Care Management Follow-up Telephone Call  Date of discharge and from where: 12/05/2020 from [facility]  How have you been since you were released from the hospital? Pt states that she is feeling much better.   Any questions or concerns? No  Items Reviewed:  Did the pt receive and understand the discharge instructions provided? Yes   Medications obtained and verified? Yes   Other? No   Any new allergies since your discharge? No   Dietary orders reviewed? N/A  Do you have support at home? Yes   Functional Questionnaire: (I = Independent and D = Dependent) ADLs: I  Bathing/Dressing- I  Meal Prep- I  Eating- I  Maintaining continence- I  Transferring/Ambulation- I  Managing Meds- I   Follow up appointments reviewed:    Are transportation arrangements needed? No   If their condition worsens, is the pt aware to call PCP or go to the Emergency Dept.? Yes  Was the patient provided with contact information for the PCP's office or ED? Yes  Was to pt encouraged to call back with questions or concerns? Yes

## 2020-12-07 LAB — URINE CULTURE: Culture: >=100000 — AB

## 2020-12-20 ENCOUNTER — Emergency Department (HOSPITAL_COMMUNITY)
Admission: EM | Admit: 2020-12-20 | Discharge: 2020-12-21 | Disposition: A | Discharge status: Home / Self Care | Payer: Medicaid Other | Attending: Emergency Medicine | Admitting: Emergency Medicine

## 2020-12-20 ENCOUNTER — Encounter (HOSPITAL_COMMUNITY): Payer: Self-pay | Admitting: Emergency Medicine

## 2020-12-20 DIAGNOSIS — R112 Nausea with vomiting, unspecified: Secondary | ICD-10-CM | POA: Insufficient documentation

## 2020-12-20 DIAGNOSIS — Z5321 Procedure and treatment not carried out due to patient leaving prior to being seen by health care provider: Secondary | ICD-10-CM | POA: Insufficient documentation

## 2020-12-20 DIAGNOSIS — R109 Unspecified abdominal pain: Secondary | ICD-10-CM | POA: Insufficient documentation

## 2020-12-20 LAB — URINALYSIS, ROUTINE W REFLEX MICROSCOPIC
Bilirubin Urine: NEGATIVE
Glucose, UA: NEGATIVE mg/dL
Hgb urine dipstick: NEGATIVE
Ketones, ur: NEGATIVE mg/dL
Leukocytes,Ua: NEGATIVE
Nitrite: NEGATIVE
Protein, ur: NEGATIVE mg/dL
Specific Gravity, Urine: 1.013 (ref 1.005–1.030)
pH: 6 (ref 5.0–8.0)

## 2020-12-20 LAB — CBC WITH DIFFERENTIAL/PLATELET
Abs Immature Granulocytes: 0.01 10*3/uL (ref 0.00–0.07)
Basophils Absolute: 0 10*3/uL (ref 0.0–0.1)
Basophils Relative: 0 %
Eosinophils Absolute: 0.2 10*3/uL (ref 0.0–0.5)
Eosinophils Relative: 3 %
HCT: 38.5 % (ref 36.0–46.0)
Hemoglobin: 11.5 g/dL — ABNORMAL LOW (ref 12.0–15.0)
Immature Granulocytes: 0 %
Lymphocytes Relative: 30 %
Lymphs Abs: 1.6 10*3/uL (ref 0.7–4.0)
MCH: 21.9 pg — ABNORMAL LOW (ref 26.0–34.0)
MCHC: 29.9 g/dL — ABNORMAL LOW (ref 30.0–36.0)
MCV: 73.2 fL — ABNORMAL LOW (ref 80.0–100.0)
Monocytes Absolute: 0.5 10*3/uL (ref 0.1–1.0)
Monocytes Relative: 9 %
Neutro Abs: 3.2 10*3/uL (ref 1.7–7.7)
Neutrophils Relative %: 58 %
Platelets: 402 10*3/uL — ABNORMAL HIGH (ref 150–400)
RBC: 5.26 MIL/uL — ABNORMAL HIGH (ref 3.87–5.11)
RDW: 16 % — ABNORMAL HIGH (ref 11.5–15.5)
WBC: 5.5 10*3/uL (ref 4.0–10.5)
nRBC: 0 % (ref 0.0–0.2)

## 2020-12-20 LAB — COMPREHENSIVE METABOLIC PANEL
ALT: 21 U/L (ref 0–44)
AST: 20 U/L (ref 15–41)
Albumin: 3.4 g/dL — ABNORMAL LOW (ref 3.5–5.0)
Alkaline Phosphatase: 93 U/L (ref 38–126)
Anion gap: 10 (ref 5–15)
BUN: 6 mg/dL (ref 6–20)
CO2: 19 mmol/L — ABNORMAL LOW (ref 22–32)
Calcium: 8.9 mg/dL (ref 8.9–10.3)
Chloride: 108 mmol/L (ref 98–111)
Creatinine, Ser: 0.87 mg/dL (ref 0.44–1.00)
GFR, Estimated: >60 mL/min (ref >60)
Glucose, Bld: 84 mg/dL (ref 70–99)
Potassium: 3.7 mmol/L (ref 3.5–5.1)
Sodium: 137 mmol/L (ref 135–145)
Total Bilirubin: 0.6 mg/dL (ref 0.3–1.2)
Total Protein: 7 g/dL (ref 6.5–8.1)

## 2020-12-20 LAB — LIPASE, BLOOD: Lipase: 47 U/L (ref 11–51)

## 2020-12-20 LAB — PREGNANCY, URINE: Preg Test, Ur: NEGATIVE

## 2020-12-20 NOTE — ED Notes (Signed)
Pt advised registration that she was leaving due to waiting to long.

## 2020-12-20 NOTE — ED Triage Notes (Signed)
Pt here from with c/o abd pain that started this morning . Some nausea no vomiting

## 2020-12-22 ENCOUNTER — Other Ambulatory Visit: Payer: Self-pay

## 2020-12-22 ENCOUNTER — Encounter (HOSPITAL_COMMUNITY): Payer: Self-pay

## 2020-12-22 ENCOUNTER — Emergency Department (HOSPITAL_COMMUNITY)
Admission: EM | Admit: 2020-12-22 | Discharge: 2020-12-22 | Disposition: A | Discharge status: Home / Self Care | Payer: Medicaid Other | Attending: Emergency Medicine | Admitting: Emergency Medicine

## 2020-12-22 DIAGNOSIS — Z7951 Long term (current) use of inhaled steroids: Secondary | ICD-10-CM | POA: Insufficient documentation

## 2020-12-22 DIAGNOSIS — J4540 Moderate persistent asthma, uncomplicated: Secondary | ICD-10-CM | POA: Insufficient documentation

## 2020-12-22 DIAGNOSIS — R300 Dysuria: Secondary | ICD-10-CM

## 2020-12-22 DIAGNOSIS — R1030 Lower abdominal pain, unspecified: Secondary | ICD-10-CM | POA: Insufficient documentation

## 2020-12-22 DIAGNOSIS — R319 Hematuria, unspecified: Secondary | ICD-10-CM | POA: Insufficient documentation

## 2020-12-22 LAB — URINALYSIS, ROUTINE W REFLEX MICROSCOPIC
Bilirubin Urine: NEGATIVE
Glucose, UA: NEGATIVE mg/dL
Ketones, ur: NEGATIVE mg/dL
Leukocytes,Ua: NEGATIVE
Nitrite: NEGATIVE
Protein, ur: NEGATIVE mg/dL
Specific Gravity, Urine: 1.015 (ref 1.005–1.030)
pH: 5 (ref 5.0–8.0)

## 2020-12-22 LAB — PREGNANCY, URINE: Preg Test, Ur: NEGATIVE

## 2020-12-22 MED ORDER — SULFAMETHOXAZOLE-TRIMETHOPRIM 800-160 MG PO TABS: 1.0000 | ORAL_TABLET | Freq: Two times a day (BID) | ORAL | 0 refills | Status: AC

## 2020-12-22 NOTE — ED Triage Notes (Signed)
Pt arrives EMS with c/o lower abdominal pain x 2 days. Vomiting and heavy vaginal bleeding beginning today.

## 2020-12-22 NOTE — ED Notes (Signed)
Water given  

## 2020-12-22 NOTE — Discharge Instructions (Signed)
You are not pregnant.  Urine did have some bacteria and blood in it. Take the prescribed medication as directed.  Make sure to drink lots of water. Follow-up with your primary care doctor to have her re-check urine after you finish the medication. Return to the ED for new or worsening symptoms.

## 2020-12-22 NOTE — ED Notes (Signed)
Pt state she is unable to provide urine sample til she have something to drink

## 2020-12-22 NOTE — ED Provider Notes (Signed)
[facility] DEPT Provider Note   CSN: [id number] Arrival date & time: 12/22/20  0058     History Chief Complaint  Patient presents with  . Abdominal Pain    Glenda Mendez is a 30 y.o. female.  The history is provided by the patient and medical records.  Abdominal Pain Associated symptoms: dysuria and hematuria     30 y.o. F with hx of ADHD, allergies, asthma, obesity, presenting to the ED with complaint of dysuria and hematuria.  States it feels like a sharp, stabbing pain in her genital region, worse with attempting to urinate.  States today she noticed some blood on tissue paper after wiping.  Denies fever, chills, flank pain.  She went to [facility] yesterday for separate complaints but never waited to be seen.  States that was for different complaints which are now "better".  Labs yesterday were normal.  States she did have period within the last few weeks which was "normal" for her.  Denies current vaginal bleeding (incorrect in triage note) or vaginal discharge.    Past Medical History:  Diagnosis Date  . ADHD (attention deficit hyperactivity disorder)   . Allergy   . Asthma   . BMI (body mass index), pediatric, 95-99% for age 29/10/2010   Is starting to gain weight again and advised and counseled today about reduction for health risk reasons. Patient is very well aware of how to do lifestyle intervention she has done this before.   Marland Kitchen Hearing loss in right ear    with hearing aid bilateral   . Hemiparesis ([place])    rt from neonatal period  . Hypoxia of newborn   . Irregular periods 06/04/2011  . PCOS (polycystic ovarian syndrome)   . Pulmonary hemorrhage of fetus or newborn    21 week C-section [facility] of with birth hypoxia    Patient Active Problem List   Diagnosis Date Noted  . Impingement of left ankle joint 12/02/2017  . Left ankle pain 06/23/2017  . Ganglion cyst of flexor tendon sheath of finger of right hand 02/02/2017  . Trigger  finger, right middle finger 02/02/2017  . Eczema 07/11/2016  . Dyspnea and respiratory abnormality 05/08/2016  . Elevated testosterone level in female 08/28/2015  . Elevated hemoglobin A1c 08/28/2015  . Abdominal pain, right upper quadrant 06/05/2015  . Abnormal urine 06/05/2015  . Alkaline phosphatase elevation 04/27/2015  . Severe obesity (BMI >= 40) ([place]) 12/19/2014  . Asthma with acute exacerbation 08/29/2014  . Moderate intermittent asthma, uncomplicated 40/98/1191  . Rash, skin 06/20/2014  . Moderate persistent asthma with acute exacerbation in adult 05/27/2014  . Anterior chest wall pain 05/27/2014  . Low grade squamous intraepithelial lesion (LGSIL) on cervical Pap smear 12/02/2013  . Obesity (BMI 30-39.9) 11/28/2013  . Visit for preventive health examination 11/28/2013  . Encounter for routine gynecological examination 11/28/2013  . Weight gain 11/28/2013  . Asthma 11/28/2013  . Moderate persistent asthma without complication 47/82/9562  . Hemiparesis ([place])   . Oral contraceptive pill surveillance 04/25/2012  . Headache 06/04/2011  . Irregular periods 06/04/2011  . Acne 06/04/2011  . BCP (birth control pills) initiation 06/04/2011  . Preventative health care 02/18/2011  . Neoplasm of uncertain behavior of skin 02/18/2011  . BMI (body mass index), pediatric, 95-99% for age 26/10/2010  . Lumbar back pain 09/17/2010  . NECK PAIN 01/02/2009  . FATIGUE 12/06/2008  . Attention deficit hyperactivity disorder (ADHD) 09/03/2007  . UNSPECIFIED HEARING LOSS 09/03/2007  . ALLERGIC  RHINITIS 05/19/2007  . ASTHMA 05/19/2007    Past Surgical History:  Procedure Laterality Date  . TONSILLECTOMY       OB History   No obstetric history on file.     Family History  Adopted: Yes  Problem Relation Age of Onset  . Hypertension Other   . Schizophrenia Mother        with alcohol drug abuse  . Diabetes Other     Social History   Tobacco Use  . Smoking status: Never Smoker   . Smokeless tobacco: Never Used  Vaping Use  . Vaping Use: Never used  Substance Use Topics  . Alcohol use: No    Alcohol/week: 0.0 standard drinks  . Drug use: No    Home Medications Prior to Admission medications   Medication Sig Start Date End Date Taking? Authorizing Provider  albuterol (PROVENTIL) (2.5 MG/3ML) 0.083% nebulizer solution Take 3 mLs (2.5 mg total) by nebulization every 4 (four) hours as needed for wheezing or shortness of breath. 06/22/19   Hedges, Dellis Filbert, PA-C  albuterol (VENTOLIN HFA) 108 (90 Base) MCG/ACT inhaler Inhale 2 puffs into the lungs every 6 (six) hours as needed for wheezing or shortness of breath. 06/22/19   Hedges, Dellis Filbert, PA-C  benzonatate (TESSALON PERLES) 100 MG capsule Take 1 capsule (100 mg total) by mouth 3 (three) times daily as needed for cough. 12/03/20   Burchette, Alinda Sierras, MD  EPINEPHrine 0.3 mg/0.3 mL IJ SOAJ injection Inject 0.3 mLs (0.3 mg total) into the muscle as needed for anaphylaxis. 04/25/20   Panosh, Standley Brooking, MD  fluticasone (FLONASE) 50 MCG/ACT nasal spray Place 2 sprays into both nostrils daily. 02/18/17   Nafziger, [name], NP  fluticasone-salmeterol (ADVAIR HFA) 230-21 MCG/ACT inhaler Inhale 2 puffs into the lungs 2 (two) times daily. 08/31/18   Brand Males, MD  hydrOXYzine (ATARAX/VISTARIL) 25 MG tablet Take 1-2 tablets (25-50 mg total) by mouth every 6 (six) hours as needed for itching. 04/25/20   Panosh, Standley Brooking, MD  ipratropium (ATROVENT) 0.02 % nebulizer solution USE 1 VIAL BY NEBULIZATION 3 (THREE) TIMES DAILY AS NEEDED FOR WHEEZING. **DUE FOR ANNUAL VISIT June 2019** 06/22/19   Hedges, Dellis Filbert, PA-C  levocetirizine (XYZAL ALLERGY 24HR) 5 MG tablet Take 1 tablet (5 mg total) by mouth every evening. 02/18/17   Nafziger, [name], NP  loratadine (CLARITIN) 10 MG tablet Take 1 tablet (10 mg total) by mouth daily. Patient taking differently: Take 10 mg by mouth daily as needed for allergies.  08/23/19   Carlisle Cater, PA-C  metFORMIN  (GLUCOPHAGE-XR) 500 MG 24 hr tablet Take 1 tablet (500 mg total) by mouth daily with breakfast. 10/19/18 11/27/28  Panosh, Standley Brooking, MD  methylphenidate 36 MG PO CR tablet TAKE (2) TABLETS BY MOUTH DAILY. Patient taking differently: Take 72 mg by mouth daily.  01/19/19   Panosh, Standley Brooking, MD  ondansetron (ZOFRAN) 4 MG tablet Take 1 tablet (4 mg total) by mouth every 8 (eight) hours as needed for up to 15 doses for nausea or vomiting. 12/05/20   Curatolo, Adam, DO  pantoprazole (PROTONIX) 20 MG tablet Take 1 tablet (20 mg total) by mouth daily for 20 days. 12/05/20 12/25/20  Curatolo, Adam, DO  sucralfate (CARAFATE) 1 g tablet Take 1 tablet (1 g total) by mouth 4 (four) times daily -  with meals and at bedtime for 10 days. 12/05/20 12/15/20  Curatolo, Adam, DO  VITAMIN D PO Take by mouth. Takes the gummies    [provider]  famotidine (PEPCID) 20 MG tablet Take 1 tablet (20 mg total) by mouth 2 (two) times daily. Take twice daily for 3 days, then as needed if symptoms recur Patient not taking: Reported on 11/28/2019 10/27/19 11/28/19  Charlann Lange, PA-C    Allergies    Patient has no known allergies.  Review of Systems   Review of Systems  Gastrointestinal: Positive for abdominal pain.  Genitourinary: Positive for dysuria and hematuria.  All other systems reviewed and are negative.   Physical Exam Updated Vital Signs BP (!) 141/71 (BP Location: Left Arm)   Pulse 85   Temp 98.5 F (36.9 C) (Oral)   Resp 20   SpO2 98%   Physical Exam Vitals and nursing note reviewed.  Constitutional:      Appearance: She is well-developed and well-nourished.  HENT:     Head: Normocephalic and atraumatic.     Mouth/Throat:     Mouth: Oropharynx is clear and moist.  Eyes:     Extraocular Movements: EOM normal.     Conjunctiva/sclera: Conjunctivae normal.     Pupils: Pupils are equal, round, and reactive to light.  Cardiovascular:     Rate and Rhythm: Normal rate and regular rhythm.     Heart sounds:  Normal heart sounds.  Pulmonary:     Effort: Pulmonary effort is normal.     Breath sounds: Normal breath sounds.  Abdominal:     General: Bowel sounds are normal.     Palpations: Abdomen is soft.     Tenderness: There is no abdominal tenderness. There is no guarding or rebound.  Musculoskeletal:        General: Normal range of motion.     Cervical back: Normal range of motion.  Skin:    General: Skin is warm and dry.  Neurological:     Mental Status: She is alert and oriented to person, place, and time.  Psychiatric:        Mood and Affect: Mood and affect normal.     ED Results / Procedures / Treatments   Labs (all labs ordered are listed, but only abnormal results are displayed) Labs Reviewed  URINALYSIS, ROUTINE W REFLEX MICROSCOPIC - Abnormal; Notable for the following components:      Result Value   APPearance HAZY (*)    Hgb urine dipstick LARGE (*)    Bacteria, UA RARE (*)    All other components within normal limits  PREGNANCY, URINE    EKG None  Radiology No results found.  Procedures Procedures   Medications Ordered in ED Medications - No data to display  ED Course  I have reviewed the triage vital signs and the nursing notes.  Pertinent labs & imaging results that were available during my care of the patient were reviewed by me and considered in my medical decision making (see chart for details).    MDM Rules/Calculators/A&P                         30 year old female presenting to the ED with lower abdominal discomfort, dysuria, and hematuria.  This began today.  States she notices blood when wiping.  States this is urinary and not vaginal.  States her cycles have been normal.  She is afebrile nontoxic.  Abdomen is soft and nontender.  No CVA tenderness.  Denies any flank pain fever, or vomiting.  UA today does have some bacteria and blood noted.  As she is symptomatic, will treat with short  course abx and have her follow-up with PCP.  Return here for  new concerns.  Final Clinical Impression(s) / ED Diagnoses Final diagnoses:  Dysuria  Hematuria, unspecified type    Rx / DC Orders ED Discharge Orders         Ordered    sulfamethoxazole-trimethoprim (BACTRIM DS) 800-160 MG tablet  2 times daily        12/22/20 0459           Larene Pickett, PA-C 12/22/20 0501    Orpah Greek, MD 12/22/20 [phone]

## 2020-12-24 ENCOUNTER — Telehealth: Payer: Self-pay | Admitting: *Deleted

## 2020-12-24 NOTE — Telephone Encounter (Signed)
Transition Care Management Follow-up Telephone Call  Date of discharge and from where: 12/22/2020 - [facility]  How have you been since you were released from the hospital? "Feeling a little bit better today"  Any questions or concerns? No  Items Reviewed:  Did the pt receive and understand the discharge instructions provided? Yes   Medications obtained and verified? Yes   Other? No   Any new allergies since your discharge? No   Dietary orders reviewed? Yes  Do you have support at home? Yes   Home Care and Equipment/Supplies: Were home health services ordered? not applicable If so, what is the name of the agency? N/A  Has the agency set up a time to come to the patient's home? not applicable Were any new equipment or medical supplies ordered?  No What is the name of the medical supply agency? N/A Were you able to get the supplies/equipment? not applicable Do you have any questions related to the use of the equipment or supplies? No  Functional Questionnaire: (I = Independent and D = Dependent) ADLs: I  Bathing/Dressing- I  Meal Prep- I  Eating- I  Maintaining continence- I  Transferring/Ambulation- I  Managing Meds- I  Follow up appointments reviewed:   PCP Hospital f/u appt confirmed? No    Specialist Hospital f/u appt confirmed? No    Are transportation arrangements needed? No   If their condition worsens, is the pt aware to call PCP or go to the Emergency Dept.? Yes  Was the patient provided with contact information for the PCP's office or ED? Yes  Was to pt encouraged to call back with questions or concerns? Yes

## 2021-02-05 IMAGING — DX CHEST - 2 VIEW
2 series · 2 of 2 positions shown · non-contrast
Comparison: 03/15/2019

CLINICAL DATA: Shortness of breath

EXAM:
CHEST - 2 VIEW

[chest pa]
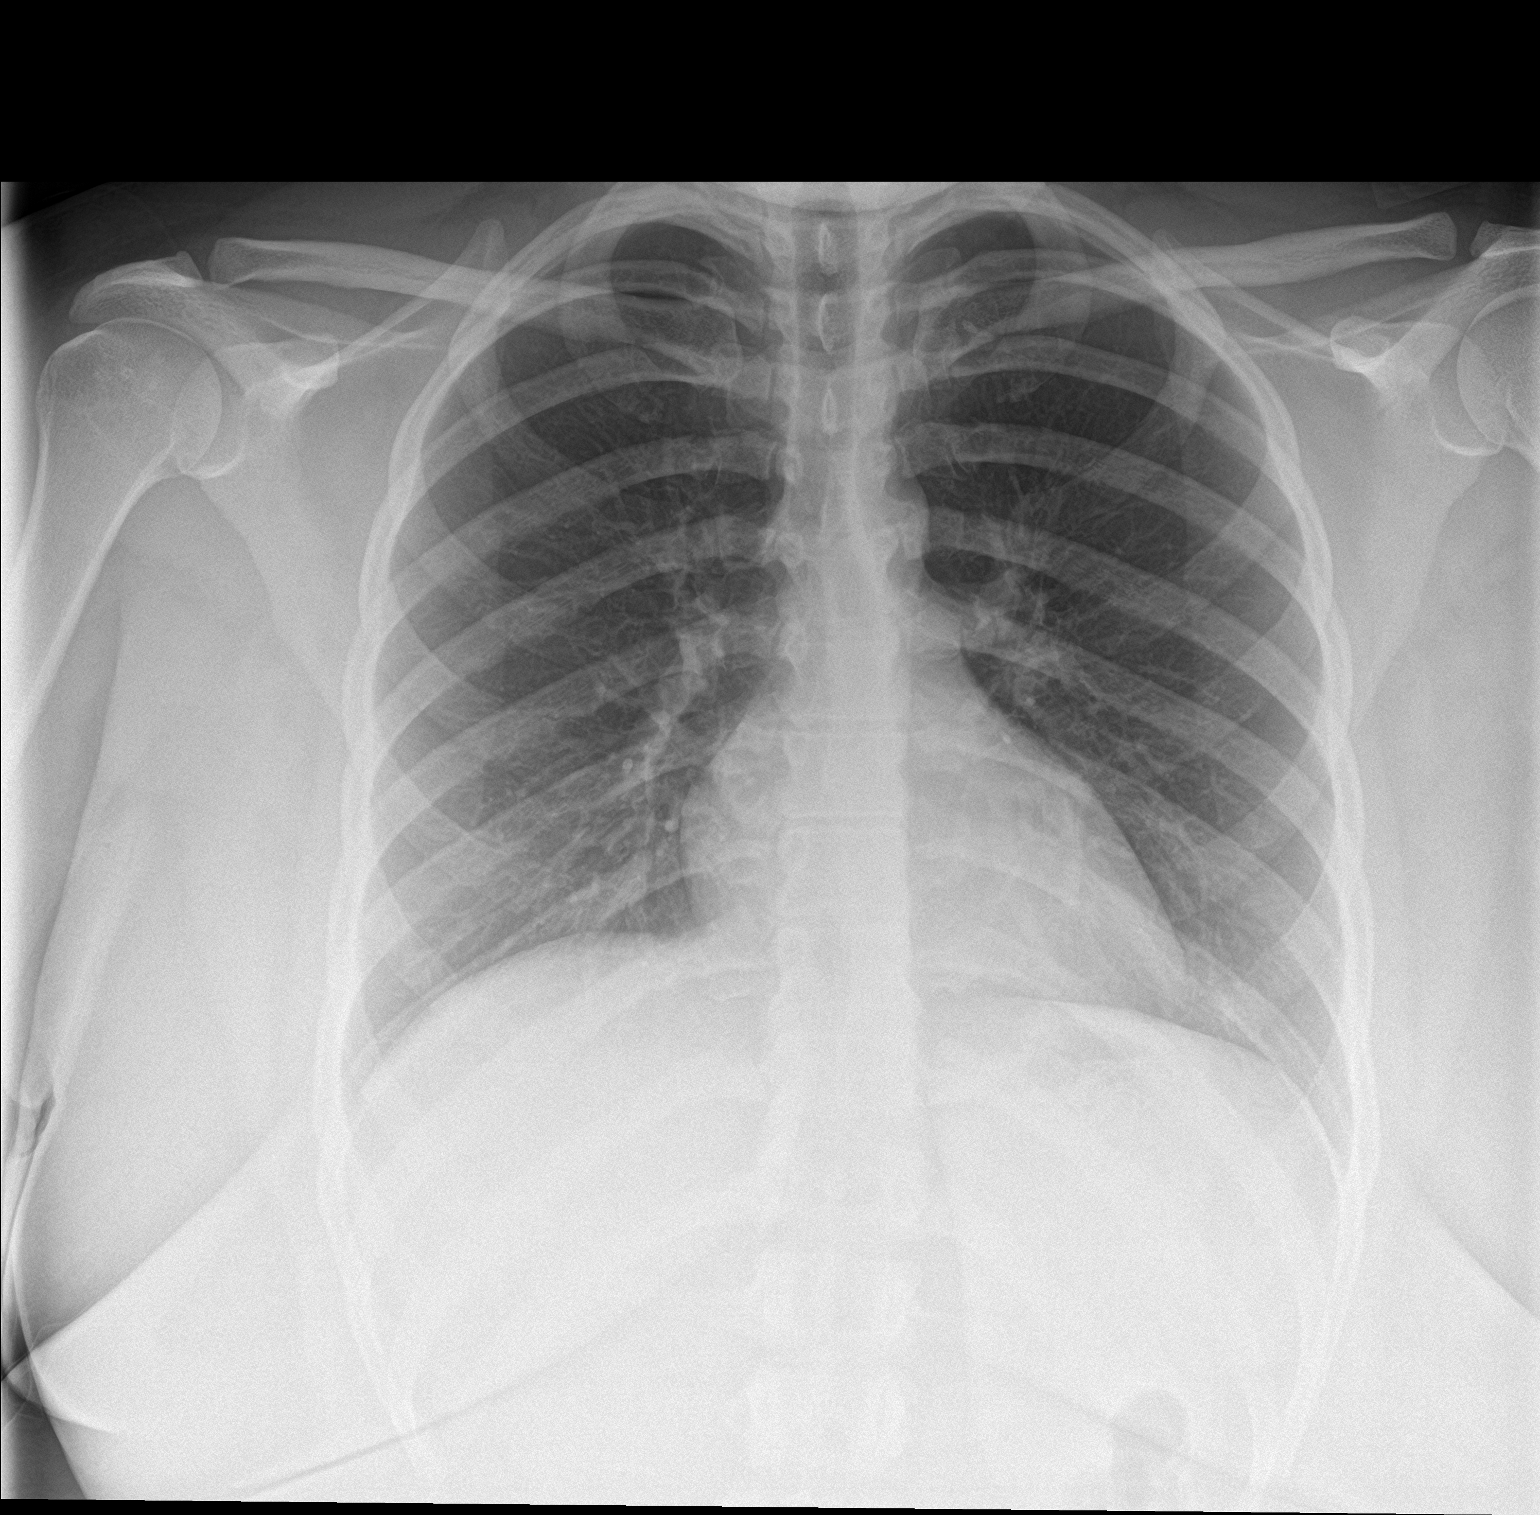

[chest lat]
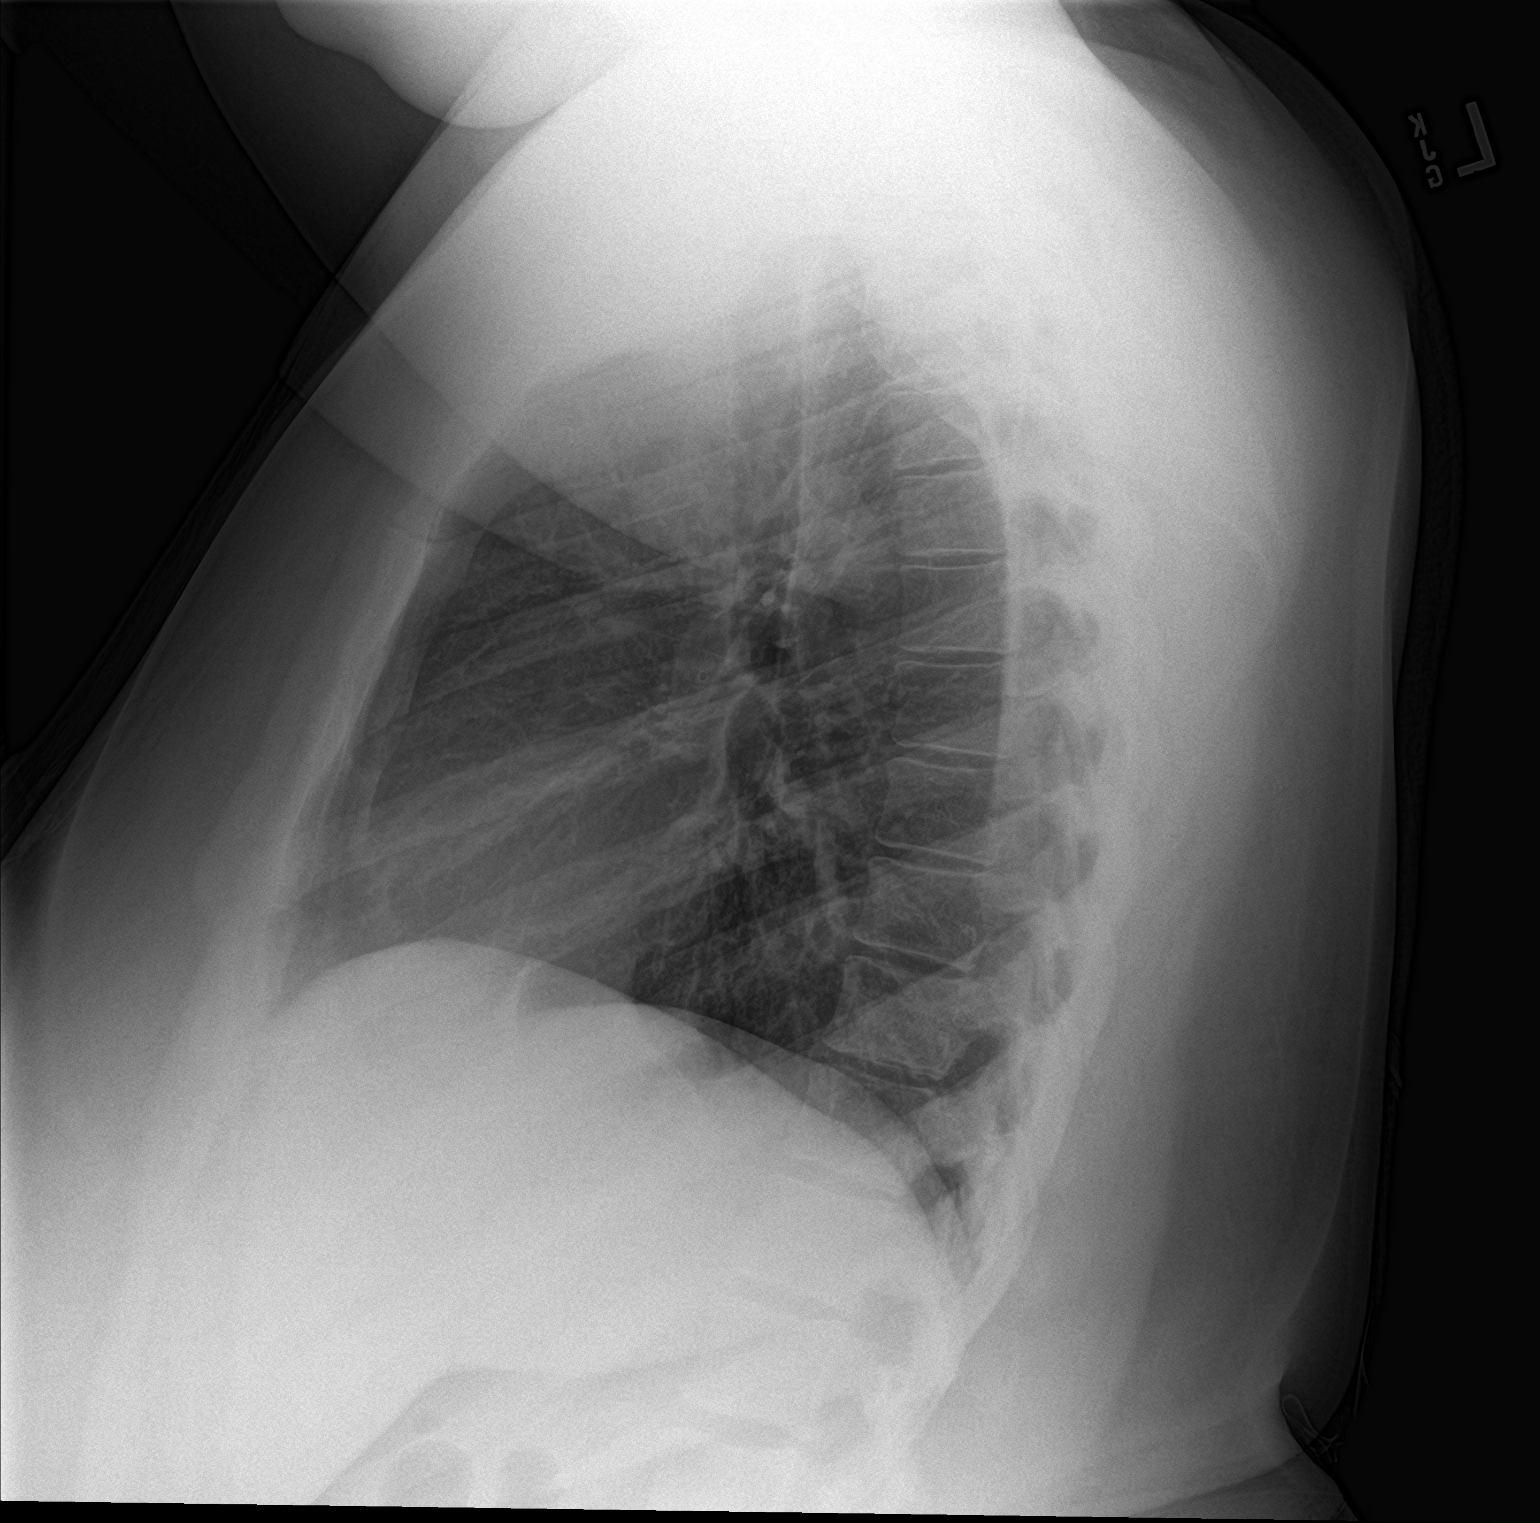

[2 of 2 positions shown; findings below may reference images not displayed]

FINDINGS: The heart size and mediastinal contours are within normal limits.
Both lungs are clear. The visualized skeletal structures are
unremarkable.
IMPRESSION: No active cardiopulmonary disease.

## 2021-07-06 ENCOUNTER — Emergency Department (HOSPITAL_COMMUNITY)
Admission: EM | Admit: 2021-07-06 | Discharge: 2021-07-06 | Disposition: A | Discharge status: Home / Self Care | Payer: Self-pay | Attending: Emergency Medicine | Admitting: Emergency Medicine

## 2021-07-06 ENCOUNTER — Encounter (HOSPITAL_COMMUNITY): Payer: Self-pay | Admitting: *Deleted

## 2021-07-06 ENCOUNTER — Other Ambulatory Visit: Payer: Self-pay

## 2021-07-06 ENCOUNTER — Emergency Department (HOSPITAL_COMMUNITY): Payer: Self-pay

## 2021-07-06 DIAGNOSIS — J4540 Moderate persistent asthma, uncomplicated: Secondary | ICD-10-CM | POA: Insufficient documentation

## 2021-07-06 DIAGNOSIS — E282 Polycystic ovarian syndrome: Secondary | ICD-10-CM | POA: Insufficient documentation

## 2021-07-06 DIAGNOSIS — N938 Other specified abnormal uterine and vaginal bleeding: Secondary | ICD-10-CM | POA: Insufficient documentation

## 2021-07-06 DIAGNOSIS — R102 Pelvic and perineal pain: Secondary | ICD-10-CM | POA: Insufficient documentation

## 2021-07-06 DIAGNOSIS — N946 Dysmenorrhea, unspecified: Secondary | ICD-10-CM | POA: Insufficient documentation

## 2021-07-06 DIAGNOSIS — Z7951 Long term (current) use of inhaled steroids: Secondary | ICD-10-CM | POA: Insufficient documentation

## 2021-07-06 DIAGNOSIS — Z85828 Personal history of other malignant neoplasm of skin: Secondary | ICD-10-CM | POA: Insufficient documentation

## 2021-07-06 LAB — CBC
HCT: 37.1 % (ref 36.0–46.0)
Hemoglobin: 11.3 g/dL — ABNORMAL LOW (ref 12.0–15.0)
MCH: 21.6 pg — ABNORMAL LOW (ref 26.0–34.0)
MCHC: 30.5 g/dL (ref 30.0–36.0)
MCV: 70.8 fL — ABNORMAL LOW (ref 80.0–100.0)
Platelets: 428 10*3/uL — ABNORMAL HIGH (ref 150–400)
RBC: 5.24 MIL/uL — ABNORMAL HIGH (ref 3.87–5.11)
RDW: 15.9 % — ABNORMAL HIGH (ref 11.5–15.5)
WBC: 5.7 10*3/uL (ref 4.0–10.5)
nRBC: 0 % (ref 0.0–0.2)

## 2021-07-06 LAB — COMPREHENSIVE METABOLIC PANEL
ALT: 28 U/L (ref 0–44)
AST: 23 U/L (ref 15–41)
Albumin: 3.5 g/dL (ref 3.5–5.0)
Alkaline Phosphatase: 107 U/L (ref 38–126)
Anion gap: 8 (ref 5–15)
BUN: 8 mg/dL (ref 6–20)
CO2: 25 mmol/L (ref 22–32)
Calcium: 8.9 mg/dL (ref 8.9–10.3)
Chloride: 106 mmol/L (ref 98–111)
Creatinine, Ser: 0.94 mg/dL (ref 0.44–1.00)
GFR, Estimated: >60 mL/min (ref >60)
Glucose, Bld: 91 mg/dL (ref 70–99)
Potassium: 3.6 mmol/L (ref 3.5–5.1)
Sodium: 139 mmol/L (ref 135–145)
Total Bilirubin: 0.4 mg/dL (ref 0.3–1.2)
Total Protein: 6.9 g/dL (ref 6.5–8.1)

## 2021-07-06 LAB — WET PREP, GENITAL
Clue Cells Wet Prep HPF POC: NONE SEEN
Sperm: NONE SEEN
Trich, Wet Prep: NONE SEEN
Yeast Wet Prep HPF POC: NONE SEEN

## 2021-07-06 LAB — LIPASE, BLOOD: Lipase: 51 U/L (ref 11–51)

## 2021-07-06 LAB — I-STAT BETA HCG BLOOD, ED (MC, WL, AP ONLY): I-stat hCG, quantitative: <5 m[IU]/mL (ref <5)

## 2021-07-06 MED ORDER — KETOROLAC TROMETHAMINE 60 MG/2ML IM SOLN
60.0000 mg | Freq: Once | INTRAMUSCULAR | Status: AC
  Administered 2021-07-06: 60 mg via INTRAMUSCULAR
  Filled 2021-07-06: qty 2

## 2021-07-06 NOTE — ED Triage Notes (Signed)
The pt arrived by [facility] from home w/c to triage  c/o abd pain and vaginal bleeding since sept 2nd  she has not seen a doctor.  No diustress heavier bleeding today with clots

## 2021-07-06 NOTE — Discharge Instructions (Signed)
The ultrasound showed that you had multiple cysts in both ovaries.  Possibility that 1 of these ruptured which caused the sudden pain.  You can take Tylenol and ibuprofen as needed for the pain.  It would be important for you to follow-up with OB/GYN given your PCOS and ongoing issues with your menses.  Your lab work look normal today.

## 2021-07-06 NOTE — ED Notes (Signed)
Received verbal report from [place] at this time

## 2021-07-06 NOTE — ED Notes (Signed)
Pt in [place] at this time 

## 2021-07-06 NOTE — ED Provider Notes (Signed)
[facility] EMERGENCY DEPARTMENT Provider Note   CSN: [id number] Arrival date & time: 07/06/21  1506     History Chief Complaint  Patient presents with   Abdominal Pain    Glenda Mendez is a 30 y.o. female.  Patient is a 30 year old female with a history of asthma, PCOS and obesity who is presenting today due to lower abdominal pain and vaginal bleeding.  Patient reports her last menses was September 2 and she bled for 3 days which is usually a fairly light period and then it stopped until yesterday she started having bleeding again that then became heavier today and around 11:00 it was profuse with large blood clots and ongoing bleeding down her legs.  She went through multiple pads and at that has now improved but however also at 11:00 she started having a severe searing/stabbing pain in her pelvic area that radiated around both sides and into her back.  She did take ibuprofen but it did not alleviate the pain.  Movement seems to make it a little bit worse.  No prior history of similar pain in the past.  She is currently sexually active but uses protection every time.  She denies any vaginal discharge.  Denies any urinary symptoms.  She reports the pain is slightly better now but continues to hurt.  She denies any upper abdominal pain, fever, nausea, vomiting.  The history is provided by the patient.  Abdominal Pain     Past Medical History:  Diagnosis Date   ADHD (attention deficit hyperactivity disorder)    Allergy    Asthma    BMI (body mass index), pediatric, 95-99% for age 74/10/2010   Is starting to gain weight again and advised and counseled today about reduction for health risk reasons. Patient is very well aware of how to do lifestyle intervention she has done this before.    Hearing loss in right ear    with hearing aid bilateral    Hemiparesis (HCC)    rt from neonatal period   Hypoxia of newborn    Irregular periods 06/04/2011   PCOS (polycystic  ovarian syndrome)    Pulmonary hemorrhage of fetus or newborn    4 week C-section [facility] of with birth hypoxia    Patient Active Problem List   Diagnosis Date Noted   Impingement of left ankle joint 12/02/2017   Left ankle pain 06/23/2017   Ganglion cyst of flexor tendon sheath of finger of right hand 02/02/2017   Trigger finger, right middle finger 02/02/2017   Eczema 07/11/2016   Dyspnea and respiratory abnormality 05/08/2016   Elevated testosterone level in female 08/28/2015   Elevated hemoglobin A1c 08/28/2015   Abdominal pain, right upper quadrant 06/05/2015   Abnormal urine 06/05/2015   Alkaline phosphatase elevation 04/27/2015   Severe obesity (BMI >= 40) (HCC) 12/19/2014   Asthma with acute exacerbation 08/29/2014   Moderate intermittent asthma, uncomplicated 99991111   Rash, skin 06/20/2014   Moderate persistent asthma with acute exacerbation in adult 05/27/2014   Anterior chest wall pain 05/27/2014   Low grade squamous intraepithelial lesion (LGSIL) on cervical Pap smear 12/02/2013   Obesity (BMI 30-39.9) 11/28/2013   Visit for preventive health examination 11/28/2013   Encounter for routine gynecological examination 11/28/2013   Weight gain 11/28/2013   Asthma 11/28/2013   Moderate persistent asthma without complication [id number]   Hemiparesis ([place])    Oral contraceptive pill surveillance 04/25/2012   Headache 06/04/2011   Irregular periods 06/04/2011  Acne 06/04/2011   BCP (birth control pills) initiation 06/04/2011   Preventative health care 02/18/2011   Neoplasm of uncertain behavior of skin 02/18/2011   BMI (body mass index), pediatric, 95-99% for age 43/10/2010   Lumbar back pain 09/17/2010   NECK PAIN 01/02/2009   FATIGUE 12/06/2008   Attention deficit hyperactivity disorder (ADHD) 09/03/2007   UNSPECIFIED HEARING LOSS 09/03/2007   ALLERGIC RHINITIS 05/19/2007   ASTHMA 05/19/2007    Past Surgical History:  Procedure Laterality Date   TONSILLECTOMY        OB History   No obstetric history on file.     Family History  Adopted: Yes  Problem Relation Age of Onset   Hypertension Other    Schizophrenia Mother        with alcohol drug abuse   Diabetes Other     Social History   Tobacco Use   Smoking status: Never   Smokeless tobacco: Never  Vaping Use   Vaping Use: Never used  Substance Use Topics   Alcohol use: No    Alcohol/week: 0.0 standard drinks   Drug use: No    Home Medications Prior to Admission medications   Medication Sig Start Date End Date Taking? Authorizing Provider  albuterol (PROVENTIL) (2.5 MG/3ML) 0.083% nebulizer solution Take 3 mLs (2.5 mg total) by nebulization every 4 (four) hours as needed for wheezing or shortness of breath. 06/22/19   Hedges, Dellis Filbert, PA-C  albuterol (VENTOLIN HFA) 108 (90 Base) MCG/ACT inhaler Inhale 2 puffs into the lungs every 6 (six) hours as needed for wheezing or shortness of breath. 06/22/19   Hedges, Dellis Filbert, PA-C  benzonatate (TESSALON PERLES) 100 MG capsule Take 1 capsule (100 mg total) by mouth 3 (three) times daily as needed for cough. 12/03/20   Burchette, Alinda Sierras, MD  EPINEPHrine 0.3 mg/0.3 mL IJ SOAJ injection Inject 0.3 mLs (0.3 mg total) into the muscle as needed for anaphylaxis. 04/25/20   Panosh, Standley Brooking, MD  fluticasone (FLONASE) 50 MCG/ACT nasal spray Place 2 sprays into both nostrils daily. 02/18/17   Nafziger, [name], NP  fluticasone-salmeterol (ADVAIR HFA) 230-21 MCG/ACT inhaler Inhale 2 puffs into the lungs 2 (two) times daily. 08/31/18   Brand Males, MD  hydrOXYzine (ATARAX/VISTARIL) 25 MG tablet Take 1-2 tablets (25-50 mg total) by mouth every 6 (six) hours as needed for itching. 04/25/20   Panosh, Standley Brooking, MD  ipratropium (ATROVENT) 0.02 % nebulizer solution USE 1 VIAL BY NEBULIZATION 3 (THREE) TIMES DAILY AS NEEDED FOR WHEEZING. **DUE FOR ANNUAL VISIT June 2019** 06/22/19   Hedges, Dellis Filbert, PA-C  levocetirizine (XYZAL ALLERGY 24HR) 5 MG tablet Take 1 tablet (5 mg  total) by mouth every evening. 02/18/17   Nafziger, [name], NP  loratadine (CLARITIN) 10 MG tablet Take 1 tablet (10 mg total) by mouth daily. Patient taking differently: Take 10 mg by mouth daily as needed for allergies.  08/23/19   Carlisle Cater, PA-C  metFORMIN (GLUCOPHAGE-XR) 500 MG 24 hr tablet Take 1 tablet (500 mg total) by mouth daily with breakfast. 10/19/18 11/27/28  Panosh, Standley Brooking, MD  methylphenidate 36 MG PO CR tablet TAKE (2) TABLETS BY MOUTH DAILY. Patient taking differently: Take 72 mg by mouth daily.  01/19/19   Panosh, Standley Brooking, MD  ondansetron (ZOFRAN) 4 MG tablet Take 1 tablet (4 mg total) by mouth every 8 (eight) hours as needed for up to 15 doses for nausea or vomiting. 12/05/20   Curatolo, Adam, DO  pantoprazole (PROTONIX) 20 MG tablet Take 1  tablet (20 mg total) by mouth daily for 20 days. 12/05/20 12/25/20  Curatolo, Adam, DO  sucralfate (CARAFATE) 1 g tablet Take 1 tablet (1 g total) by mouth 4 (four) times daily -  with meals and at bedtime for 10 days. 12/05/20 12/15/20  Curatolo, Adam, DO  VITAMIN D PO Take by mouth. Takes the gummies    [provider]  famotidine (PEPCID) 20 MG tablet Take 1 tablet (20 mg total) by mouth 2 (two) times daily. Take twice daily for 3 days, then as needed if symptoms recur Patient not taking: Reported on 11/28/2019 10/27/19 11/28/19  Charlann Lange, PA-C    Allergies    Patient has no known allergies.  Review of Systems   Review of Systems  Gastrointestinal:  Positive for abdominal pain.  All other systems reviewed and are negative.  Physical Exam Updated Vital Signs BP (!) 109/47 (BP Location: Right Arm)   Pulse 60   Temp 98.3 F (36.8 C) (Oral)   Resp 16   Ht '5\' 6"'$  (1.676 m)   Wt 123.5 kg   LMP 06/21/2021   SpO2 100%   BMI 43.95 kg/m   Physical Exam Vitals and nursing note reviewed. Exam conducted with a chaperone present.  Constitutional:      General: She is not in acute distress.    Appearance: She is well-developed.   HENT:     Head: Normocephalic and atraumatic.     Mouth/Throat:     Mouth: Mucous membranes are moist.  Eyes:     Extraocular Movements: Extraocular movements intact.     Conjunctiva/sclera: Conjunctivae normal.     Pupils: Pupils are equal, round, and reactive to light.  Cardiovascular:     Rate and Rhythm: Normal rate and regular rhythm.     Heart sounds: Normal heart sounds. No murmur heard.   No friction rub.  Pulmonary:     Effort: Pulmonary effort is normal.     Breath sounds: Normal breath sounds. No wheezing or rales.  Abdominal:     General: Bowel sounds are normal. There is no distension.     Palpations: Abdomen is soft.     Tenderness: There is abdominal tenderness in the suprapubic area. There is no right CVA tenderness, left CVA tenderness, guarding or rebound.  Genitourinary:    Vagina: Normal. No bleeding.     Cervix: Discharge present. No cervical motion tenderness or friability.     Uterus: Normal.      Adnexa:        Right: Tenderness present. No mass.         Left: Tenderness present. No mass.    Musculoskeletal:        General: No tenderness. Normal range of motion.     Cervical back: Normal range of motion and neck supple. No tenderness.     Right lower leg: No edema.     Left lower leg: No edema.     Comments: No edema  Skin:    General: Skin is warm and dry.     Findings: No rash.  Neurological:     Mental Status: She is alert and oriented to person, place, and time. Mental status is at baseline.     Cranial Nerves: No cranial nerve deficit.  Psychiatric:        Mood and Affect: Mood normal.        Behavior: Behavior normal.        Thought Content: Thought content normal.  ED Results / Procedures / Treatments   Labs (all labs ordered are listed, but only abnormal results are displayed) Labs Reviewed  WET PREP, GENITAL - Abnormal; Notable for the following components:      Result Value   WBC, Wet Prep HPF POC MANY (*)    All other  components within normal limits  CBC - Abnormal; Notable for the following components:   RBC 5.24 (*)    Hemoglobin 11.3 (*)    MCV 70.8 (*)    MCH 21.6 (*)    RDW 15.9 (*)    Platelets 428 (*)    All other components within normal limits  LIPASE, BLOOD  COMPREHENSIVE METABOLIC PANEL  I-STAT BETA HCG BLOOD, ED ([facility], [facility], [facility] ONLY)  GC/CHLAMYDIA PROBE AMP ([place]) NOT AT [facility]    EKG None  Radiology US PELVIC COMPLETE W TRANSVAGINAL AND TORSION R/O  Result Date: 07/06/2021 CLINICAL DATA:  Pelvic pain. EXAM: TRANSABDOMINAL AND TRANSVAGINAL ULTRASOUND OF PELVIS DOPPLER ULTRASOUND OF OVARIES TECHNIQUE: Both transabdominal and transvaginal ultrasound examinations of the pelvis were performed. Transabdominal technique was performed for global imaging of the pelvis including uterus, ovaries, adnexal regions, and pelvic cul-de-sac. It was necessary to proceed with endovaginal exam following the transabdominal exam to visualize the ovaries and uterus. Color and duplex Doppler ultrasound was utilized to evaluate blood flow to the ovaries. COMPARISON:  None. FINDINGS: Uterus Measurements: 7.2 x 3.5 x 4.3 cm = volume: 57 mL. No fibroids or other mass visualized. The uterus is retroverted and retroflexed. Endometrium Thickness: 10.9 mm.  No focal abnormality visualized. Right ovary Measurements: 4.0 x 2.3 x 2.5 cm = volume: 12 mL. Normal appearance/no adnexal mass. Numerous small follicles are present (more than 15). Left ovary Measurements: 3.3 x 1.6 x 2.1 cm = volume: 6 mL. Normal appearance/no adnexal mass. Numerous small follicles are present (1 in 15). Pulsed Doppler evaluation of both ovaries demonstrates normal low-resistance arterial and venous waveforms. Other findings There is a small amount of free fluid in the pelvis IMPRESSION: 1. Numerous small follicles seen throughout both ovaries. Findings can be seen in setting of polycystic ovarian syndrome. Please correlate clinically. 2. Small amount of  free fluid in the pelvis. 3. Normal appearance of uterus. Electronically Signed   By: Ronney Asters M.D.   On: 07/06/2021 20:09    Procedures Procedures   Medications Ordered in ED Medications  ketorolac (TORADOL) injection 60 mg (60 mg Intramuscular Given 07/06/21 1722)    ED Course  I have reviewed the triage vital signs and the nursing notes.  Pertinent labs & imaging results that were available during my care of the patient were reviewed by me and considered in my medical decision making (see chart for details).    MDM Rules/Calculators/A&P                           Patient is a well-appearing 30 year old female presenting with vaginal bleeding and pelvic pain radiating into her low back.  She was having large blood clots today which have now improved.  The pain started suddenly and has been persistent but slightly better.  Concern for possible ovarian torsion, ruptured ovarian cyst, uterine cramping from large clots and dysmenorrhea.  Lower suspicion for kidney stone at this time.  No exam findings to suggest appendicitis, diverticulitis, hepatitis or pancreatitis.  Patient given IM Toradol.  Pelvic exam for further evaluation.  Patient may need ultrasound.  hCG is negative, CBC  with a hemoglobin of 11 which is the patient's baseline.  8:25 PM CMP and lipase wnl.  On pelvic exam patient has significant tenderness over bilateral adnexa.  Ultrasound to further evaluate for ovarian cyst, torsion.  Low suspicion for STI at this time as patient uses protection every time and has only 1 partner.  No significant discharge or symptoms consistent with PID.  8:25 PM [place] without torsion and multiple follicles bilaterally.  Suspect ruptured cyst.  Labs o/w wnl.  Wet prep with WBC's only.  No trich or clue cells.  MDM   Amount and/or Complexity of Data Reviewed Clinical lab tests: ordered and reviewed Tests in the radiology section of CPT: ordered and reviewed Independent visualization of  images, tracings, or specimens: yes    Final Clinical Impression(s) / ED Diagnoses Final diagnoses:  Dysfunctional uterine bleeding  Dysmenorrhea  PCOS (polycystic ovarian syndrome)    Rx / DC Orders ED Discharge Orders     None        Blanchie Dessert, MD 07/06/21 2027

## 2021-07-08 LAB — GC/CHLAMYDIA PROBE AMP (~~LOC~~) NOT AT ARMC
Chlamydia: NEGATIVE
Comment: NEGATIVE
Comment: NORMAL
Neisseria Gonorrhea: NEGATIVE

## 2021-07-16 ENCOUNTER — Other Ambulatory Visit: Payer: Self-pay

## 2021-07-16 ENCOUNTER — Emergency Department (HOSPITAL_COMMUNITY)
Admission: EM | Admit: 2021-07-16 | Discharge: 2021-07-16 | Disposition: A | Discharge status: Home / Self Care | Payer: Medicaid Other | Attending: Student | Admitting: Student

## 2021-07-16 ENCOUNTER — Telehealth: Payer: Self-pay | Admitting: Internal Medicine

## 2021-07-16 ENCOUNTER — Encounter (HOSPITAL_COMMUNITY): Payer: Self-pay | Admitting: Emergency Medicine

## 2021-07-16 DIAGNOSIS — Z85820 Personal history of malignant melanoma of skin: Secondary | ICD-10-CM | POA: Insufficient documentation

## 2021-07-16 DIAGNOSIS — Z7951 Long term (current) use of inhaled steroids: Secondary | ICD-10-CM | POA: Insufficient documentation

## 2021-07-16 DIAGNOSIS — H6692 Otitis media, unspecified, left ear: Secondary | ICD-10-CM | POA: Insufficient documentation

## 2021-07-16 DIAGNOSIS — J4531 Mild persistent asthma with (acute) exacerbation: Secondary | ICD-10-CM | POA: Insufficient documentation

## 2021-07-16 DIAGNOSIS — H9319 Tinnitus, unspecified ear: Secondary | ICD-10-CM

## 2021-07-16 MED ORDER — AMOXICILLIN-POT CLAVULANATE 875-125 MG PO TABS: 1.0000 | ORAL_TABLET | Freq: Two times a day (BID) | ORAL | 0 refills | Status: AC

## 2021-07-16 NOTE — Telephone Encounter (Signed)
I spoke with the pt and she stated that she has been experiencing tinnitus and difficulty hearing in her left ear for x1 week. Pt reported that she had a tooth removed on the left side of her mouth and has been having difficulties since then. Pt stated that her hearing is fading and she is hearing an echo in her ear. Pt also explained that she has an hearing aid for her left ear but as been unable to hear clearly while using it. Please advise

## 2021-07-16 NOTE — Discharge Instructions (Signed)
You were given a prescription for antibiotics. Please take the antibiotic prescription fully.   Follow up with your ear nose and throat doctor in the next 3-5 days   Return to the emergency department for any new or worsening symptoms

## 2021-07-16 NOTE — ED Provider Notes (Signed)
[name] DEPT Provider Note   CSN: [id number] Arrival date & time: 07/16/21  1528     History No chief complaint on file.   Glenda Mendez is a 31 y.o. female.  HPI  Patient is a 30 year old female with a history of ADHD, allergies, asthma, hearing loss of the right ear and partial hearing loss of the left ear presenting for evaluation of left ear pain and changes in her hearing for the last few days.  Patient states that she feels like her hearing is coming and going and she has a pressure and pain to the left ear.  There is been no drainage and there is been no fevers noted.  She did recently have a tooth removed and she had some swelling in her face after that but she states that it is somewhat improved since onset.  Past Medical History:  Diagnosis Date   ADHD (attention deficit hyperactivity disorder)    Allergy    Asthma    BMI (body mass index), pediatric, 95-99% for age 74/10/2010   Is starting to gain weight again and advised and counseled today about reduction for health risk reasons. Patient is very well aware of how to do lifestyle intervention she has done this before.    Hearing loss in right ear    with hearing aid bilateral    Hemiparesis (HCC)    rt from neonatal period   Hypoxia of newborn    Irregular periods 06/04/2011   PCOS (polycystic ovarian syndrome)    Pulmonary hemorrhage of fetus or newborn    42 week C-section [facility] of with birth hypoxia    Patient Active Problem List   Diagnosis Date Noted   Impingement of left ankle joint 12/02/2017   Left ankle pain 06/23/2017   Ganglion cyst of flexor tendon sheath of finger of right hand 02/02/2017   Trigger finger, right middle finger 02/02/2017   Eczema 07/11/2016   Dyspnea and respiratory abnormality 05/08/2016   Elevated testosterone level in female 08/28/2015   Elevated hemoglobin A1c 08/28/2015   Abdominal pain, right upper quadrant 06/05/2015   Abnormal urine  06/05/2015   Alkaline phosphatase elevation 04/27/2015   Severe obesity (BMI >= 40) (HCC) 12/19/2014   Asthma with acute exacerbation 08/29/2014   Moderate intermittent asthma, uncomplicated 62/13/0865   Rash, skin 06/20/2014   Moderate persistent asthma with acute exacerbation in adult 05/27/2014   Anterior chest wall pain 05/27/2014   Low grade squamous intraepithelial lesion (LGSIL) on cervical Pap smear 12/02/2013   Obesity (BMI 30-39.9) 11/28/2013   Visit for preventive health examination 11/28/2013   Encounter for routine gynecological examination 11/28/2013   Weight gain 11/28/2013   Asthma 11/28/2013   Moderate persistent asthma without complication 78/46/9629   Hemiparesis ([place])    Oral contraceptive pill surveillance 04/25/2012   Headache 06/04/2011   Irregular periods 06/04/2011   Acne 06/04/2011   BCP (birth control pills) initiation 06/04/2011   Preventative health care 02/18/2011   Neoplasm of uncertain behavior of skin 02/18/2011   BMI (body mass index), pediatric, 95-99% for age 51/10/2010   Lumbar back pain 09/17/2010   NECK PAIN 01/02/2009   FATIGUE 12/06/2008   Attention deficit hyperactivity disorder (ADHD) 09/03/2007   UNSPECIFIED HEARING LOSS 09/03/2007   ALLERGIC RHINITIS 05/19/2007   ASTHMA 05/19/2007    Past Surgical History:  Procedure Laterality Date   TONSILLECTOMY       OB History   No obstetric history on file.  Family History  Adopted: Yes  Problem Relation Age of Onset   Hypertension Other    Schizophrenia Mother        with alcohol drug abuse   Diabetes Other     Social History   Tobacco Use   Smoking status: Never   Smokeless tobacco: Never  Vaping Use   Vaping Use: Never used  Substance Use Topics   Alcohol use: No    Alcohol/week: 0.0 standard drinks   Drug use: No    Home Medications Prior to Admission medications   Medication Sig Start Date End Date Taking? Authorizing Provider  amoxicillin-clavulanate  (AUGMENTIN) 875-125 MG tablet Take 1 tablet by mouth 2 (two) times daily for 7 days. 07/16/21 07/23/21 Yes Aviv Rota S, PA-C  albuterol (PROVENTIL) (2.5 MG/3ML) 0.083% nebulizer solution Take 3 mLs (2.5 mg total) by nebulization every 4 (four) hours as needed for wheezing or shortness of breath. 06/22/19   Hedges, Dellis Filbert, PA-C  albuterol (VENTOLIN HFA) 108 (90 Base) MCG/ACT inhaler Inhale 2 puffs into the lungs every 6 (six) hours as needed for wheezing or shortness of breath. 06/22/19   Hedges, Dellis Filbert, PA-C  benzonatate (TESSALON PERLES) 100 MG capsule Take 1 capsule (100 mg total) by mouth 3 (three) times daily as needed for cough. 12/03/20   Burchette, Alinda Sierras, MD  EPINEPHrine 0.3 mg/0.3 mL IJ SOAJ injection Inject 0.3 mLs (0.3 mg total) into the muscle as needed for anaphylaxis. 04/25/20   Panosh, Standley Brooking, MD  fluticasone (FLONASE) 50 MCG/ACT nasal spray Place 2 sprays into both nostrils daily. 02/18/17   Nafziger, [name], NP  fluticasone-salmeterol (ADVAIR HFA) 230-21 MCG/ACT inhaler Inhale 2 puffs into the lungs 2 (two) times daily. 08/31/18   Brand Males, MD  hydrOXYzine (ATARAX/VISTARIL) 25 MG tablet Take 1-2 tablets (25-50 mg total) by mouth every 6 (six) hours as needed for itching. 04/25/20   Panosh, Standley Brooking, MD  ipratropium (ATROVENT) 0.02 % nebulizer solution USE 1 VIAL BY NEBULIZATION 3 (THREE) TIMES DAILY AS NEEDED FOR WHEEZING. **DUE FOR ANNUAL VISIT June 2019** 06/22/19   Hedges, Dellis Filbert, PA-C  levocetirizine (XYZAL ALLERGY 24HR) 5 MG tablet Take 1 tablet (5 mg total) by mouth every evening. 02/18/17   Nafziger, [name], NP  loratadine (CLARITIN) 10 MG tablet Take 1 tablet (10 mg total) by mouth daily. Patient taking differently: Take 10 mg by mouth daily as needed for allergies.  08/23/19   Carlisle Cater, PA-C  metFORMIN (GLUCOPHAGE-XR) 500 MG 24 hr tablet Take 1 tablet (500 mg total) by mouth daily with breakfast. 10/19/18 11/27/28  Panosh, Standley Brooking, MD  methylphenidate 36 MG PO CR tablet TAKE  (2) TABLETS BY MOUTH DAILY. Patient taking differently: Take 72 mg by mouth daily.  01/19/19   Panosh, Standley Brooking, MD  ondansetron (ZOFRAN) 4 MG tablet Take 1 tablet (4 mg total) by mouth every 8 (eight) hours as needed for up to 15 doses for nausea or vomiting. 12/05/20   Curatolo, Adam, DO  pantoprazole (PROTONIX) 20 MG tablet Take 1 tablet (20 mg total) by mouth daily for 20 days. 12/05/20 12/25/20  Curatolo, Adam, DO  sucralfate (CARAFATE) 1 g tablet Take 1 tablet (1 g total) by mouth 4 (four) times daily -  with meals and at bedtime for 10 days. 12/05/20 12/15/20  Curatolo, Adam, DO  VITAMIN D PO Take by mouth. Takes the gummies    [provider]  famotidine (PEPCID) 20 MG tablet Take 1 tablet (20 mg total) by mouth 2 (  two) times daily. Take twice daily for 3 days, then as needed if symptoms recur Patient not taking: Reported on 11/28/2019 10/27/19 11/28/19  Charlann Lange, PA-C    Allergies    Patient has no known allergies.  Review of Systems   Review of Systems  Constitutional:  Negative for fever.  HENT:  Positive for dental problem and ear pain.   Respiratory:  Negative for shortness of breath.   Cardiovascular:  Negative for chest pain.  Gastrointestinal:  Negative for nausea and vomiting.   Physical Exam Updated Vital Signs BP (!) 150/87 (BP Location: Right Arm)   Pulse 71   Temp 98.4 F (36.9 C) (Oral)   Resp 16   Ht 5\' 6"  (1.676 m)   Wt 123.5 kg   LMP 06/21/2021   SpO2 100%   BMI 43.95 kg/m   Physical Exam Vitals and nursing note reviewed.  Constitutional:      General: She is not in acute distress.    Appearance: She is well-developed.  HENT:     Head: Normocephalic and atraumatic.     Ears:     Comments: Left TM with purulent effusion present    Mouth/Throat:     Comments: Recent tooth extraction noted to the left upper mouth which appears to be well healing, no facial swelling, no trismus Eyes:     Conjunctiva/sclera: Conjunctivae normal.  Cardiovascular:      Rate and Rhythm: Normal rate.  Pulmonary:     Effort: Pulmonary effort is normal.  Musculoskeletal:        General: Normal range of motion.     Cervical back: Neck supple.  Skin:    General: Skin is warm and dry.  Neurological:     Mental Status: She is alert.    ED Results / Procedures / Treatments   Labs (all labs ordered are listed, but only abnormal results are displayed) Labs Reviewed - No data to display  EKG None  Radiology No results found.  Procedures Procedures   Medications Ordered in ED Medications - No data to display  ED Course  I have reviewed the triage vital signs and the nursing notes.  Pertinent labs & imaging results that were available during my care of the patient were reviewed by me and considered in my medical decision making (see chart for details).    MDM Rules/Calculators/A&P                          30 year old female presents the emergency department today for evaluation of pain to the left TM.  On exam she has a purulent effusion present.  She does not have any fevers or other systemic symptoms at this time p.o. we will treat her for acute otitis media.  I did advise the patient and her mother at bedside that she needs to follow-up with her ear nose and throat doctor that she follows with at [facility] for further evaluation within the next few days and that she needs to return the emergency department for any new or worsening symptoms the meantime.  They are agreeable with the plan and have no further questions.  Patient stable for discharge.   Final Clinical Impression(s) / ED Diagnoses Final diagnoses:  Left otitis media, unspecified otitis media type    Rx / DC Orders ED Discharge Orders          Ordered    amoxicillin-clavulanate (AUGMENTIN) 875-125 MG tablet  2 times daily  07/16/21 1636             Rodney Booze, PA-C 07/16/21 1636    Teressa Lower, MD 07/16/21 1757

## 2021-07-16 NOTE — Telephone Encounter (Signed)
PT called wanting to speak to Dr.Panosh and provided no info about what it is for and hung up without providing anything. Please advise.

## 2021-07-16 NOTE — ED Triage Notes (Signed)
Pt arrived via POV with c/o left ear hearing loss. Pt reports she is partially deaf in left ear and completely death in her right ear. Pt reports ear pain and worsening hearing loss began last Thursday when she got her tooth removed on that side. Pt also reports hearing a "popping noise", a headache and pain all around her jaw line.

## 2021-07-17 NOTE — Telephone Encounter (Signed)
I spoke with the pt and she stated she visited the ED yesterday and stated that fluid was found in her ear. Pt reported that she was prescribed Amoxicillin during ED visit. Pt inquired if referral to ENT can be placed.

## 2021-07-17 NOTE — Telephone Encounter (Signed)
Yes please do ent referral

## 2021-07-17 NOTE — Telephone Encounter (Signed)
So she  would need OV  to see other provider( I wont be her for 2 weeks)   but because of her birth history if needed do referral to ent . For for her symptoms .

## 2021-07-18 NOTE — Addendum Note (Signed)
Addended by: Nilda Riggs on: 07/18/2021 08:37 AM   Modules accepted: Orders

## 2021-07-18 NOTE — Telephone Encounter (Signed)
Referral has been placed and pt is aware. 

## 2021-10-16 ENCOUNTER — Encounter (HOSPITAL_COMMUNITY): Payer: Self-pay | Admitting: Emergency Medicine

## 2021-10-16 ENCOUNTER — Emergency Department (HOSPITAL_COMMUNITY)
Admission: EM | Admit: 2021-10-16 | Discharge: 2021-10-16 | Disposition: A | Discharge status: Home / Self Care | Payer: Medicaid Other | Attending: Emergency Medicine | Admitting: Emergency Medicine

## 2021-10-16 ENCOUNTER — Other Ambulatory Visit: Payer: Self-pay

## 2021-10-16 DIAGNOSIS — R0981 Nasal congestion: Secondary | ICD-10-CM | POA: Insufficient documentation

## 2021-10-16 DIAGNOSIS — Z7951 Long term (current) use of inhaled steroids: Secondary | ICD-10-CM | POA: Insufficient documentation

## 2021-10-16 DIAGNOSIS — J301 Allergic rhinitis due to pollen: Secondary | ICD-10-CM

## 2021-10-16 DIAGNOSIS — J4541 Moderate persistent asthma with (acute) exacerbation: Secondary | ICD-10-CM | POA: Insufficient documentation

## 2021-10-16 DIAGNOSIS — H65192 Other acute nonsuppurative otitis media, left ear: Secondary | ICD-10-CM

## 2021-10-16 MED ORDER — FLUTICASONE PROPIONATE 50 MCG/ACT NA SUSP: 2.0000 | Freq: Every day | NASAL | 0 refills | Status: DC

## 2021-10-16 MED ORDER — LORATADINE 10 MG PO TABS: 10.0000 mg | ORAL_TABLET | Freq: Every day | ORAL | 0 refills | Status: DC

## 2021-10-16 NOTE — Discharge Instructions (Signed)
You have fluid behind her left ear today but no sign of ear infection.  This can be caused by congestion.  You can start using the nasal spray but you could also use a Nettie pot and also an antihistamine.  You can use Tylenol or ibuprofen as needed for pain.

## 2021-10-16 NOTE — ED Provider Notes (Signed)
[facility] DEPT Provider Note   CSN: [id number] Arrival date & time: 10/16/21  1043     History No chief complaint on file.   Glenda Mendez is a 30 y.o. female.  Patient is a 30 year old female with a history of hemiparesis and hearing loss from a neonatal history of having ECMO, asthma and ADHD who is presenting today with complaints of left ear pain.  Symptoms started yesterday.  She has had some mild nasal congestion but denies any fever.  No trauma to the ear nothing draining from the ear.  She has not tried anything to make this better.  The history is provided by the patient.      Past Medical History:  Diagnosis Date   ADHD (attention deficit hyperactivity disorder)    Allergy    Asthma    BMI (body mass index), pediatric, 95-99% for age 70/10/2010   Is starting to gain weight again and advised and counseled today about reduction for health risk reasons. Patient is very well aware of how to do lifestyle intervention she has done this before.    Hearing loss in right ear    with hearing aid bilateral    Hemiparesis (HCC)    rt from neonatal period   Hypoxia of newborn    Irregular periods 06/04/2011   PCOS (polycystic ovarian syndrome)    Pulmonary hemorrhage of fetus or newborn    10 week C-section [facility] of with birth hypoxia    Patient Active Problem List   Diagnosis Date Noted   Impingement of left ankle joint 12/02/2017   Left ankle pain 06/23/2017   Ganglion cyst of flexor tendon sheath of finger of right hand 02/02/2017   Trigger finger, right middle finger 02/02/2017   Eczema 07/11/2016   Dyspnea and respiratory abnormality 05/08/2016   Elevated testosterone level in female 08/28/2015   Elevated hemoglobin A1c 08/28/2015   Abdominal pain, right upper quadrant 06/05/2015   Abnormal urine 06/05/2015   Alkaline phosphatase elevation 04/27/2015   Severe obesity (BMI >= 40) (HCC) 12/19/2014   Asthma with acute exacerbation  08/29/2014   Moderate intermittent asthma, uncomplicated 45/62/5638   Rash, skin 06/20/2014   Moderate persistent asthma with acute exacerbation in adult 05/27/2014   Anterior chest wall pain 05/27/2014   Low grade squamous intraepithelial lesion (LGSIL) on cervical Pap smear 12/02/2013   Obesity (BMI 30-39.9) 11/28/2013   Visit for preventive health examination 11/28/2013   Encounter for routine gynecological examination 11/28/2013   Weight gain 11/28/2013   Asthma 11/28/2013   Moderate persistent asthma without complication 93/73/4287   Hemiparesis ([place])    Oral contraceptive pill surveillance 04/25/2012   Headache 06/04/2011   Irregular periods 06/04/2011   Acne 06/04/2011   BCP (birth control pills) initiation 06/04/2011   Preventative health care 02/18/2011   Neoplasm of uncertain behavior of skin 02/18/2011   BMI (body mass index), pediatric, 95-99% for age 28/10/2010   Lumbar back pain 09/17/2010   NECK PAIN 01/02/2009   FATIGUE 12/06/2008   Attention deficit hyperactivity disorder (ADHD) 09/03/2007   UNSPECIFIED HEARING LOSS 09/03/2007   ALLERGIC RHINITIS 05/19/2007   ASTHMA 05/19/2007    Past Surgical History:  Procedure Laterality Date   TONSILLECTOMY       OB History   No obstetric history on file.     Family History  Adopted: Yes  Problem Relation Age of Onset   Hypertension Other    Schizophrenia Mother  with alcohol drug abuse   Diabetes Other     Social History   Tobacco Use   Smoking status: Never   Smokeless tobacco: Never  Vaping Use   Vaping Use: Never used  Substance Use Topics   Alcohol use: No    Alcohol/week: 0.0 standard drinks   Drug use: No    Home Medications Prior to Admission medications   Medication Sig Start Date End Date Taking? Authorizing Provider  albuterol (PROVENTIL) (2.5 MG/3ML) 0.083% nebulizer solution Take 3 mLs (2.5 mg total) by nebulization every 4 (four) hours as needed for wheezing or shortness of  breath. 06/22/19   Hedges, Dellis Filbert, PA-C  albuterol (VENTOLIN HFA) 108 (90 Base) MCG/ACT inhaler Inhale 2 puffs into the lungs every 6 (six) hours as needed for wheezing or shortness of breath. 06/22/19   Hedges, Dellis Filbert, PA-C  benzonatate (TESSALON PERLES) 100 MG capsule Take 1 capsule (100 mg total) by mouth 3 (three) times daily as needed for cough. 12/03/20   Burchette, Alinda Sierras, MD  EPINEPHrine 0.3 mg/0.3 mL IJ SOAJ injection Inject 0.3 mLs (0.3 mg total) into the muscle as needed for anaphylaxis. 04/25/20   Panosh, Standley Brooking, MD  fluticasone (FLONASE) 50 MCG/ACT nasal spray Place 2 sprays into both nostrils daily. 10/16/21   Blanchie Dessert, MD  fluticasone-salmeterol (ADVAIR HFA) 230-21 MCG/ACT inhaler Inhale 2 puffs into the lungs 2 (two) times daily. 08/31/18   Brand Males, MD  hydrOXYzine (ATARAX/VISTARIL) 25 MG tablet Take 1-2 tablets (25-50 mg total) by mouth every 6 (six) hours as needed for itching. 04/25/20   Panosh, Standley Brooking, MD  ipratropium (ATROVENT) 0.02 % nebulizer solution USE 1 VIAL BY NEBULIZATION 3 (THREE) TIMES DAILY AS NEEDED FOR WHEEZING. **DUE FOR ANNUAL VISIT June 2019** 06/22/19   Hedges, Dellis Filbert, PA-C  levocetirizine (XYZAL ALLERGY 24HR) 5 MG tablet Take 1 tablet (5 mg total) by mouth every evening. 02/18/17   Nafziger, [name], NP  loratadine (CLARITIN) 10 MG tablet Take 1 tablet (10 mg total) by mouth daily. 10/16/21   Blanchie Dessert, MD  metFORMIN (GLUCOPHAGE-XR) 500 MG 24 hr tablet Take 1 tablet (500 mg total) by mouth daily with breakfast. 10/19/18 11/27/28  Panosh, Standley Brooking, MD  methylphenidate 36 MG PO CR tablet TAKE (2) TABLETS BY MOUTH DAILY. Patient taking differently: Take 72 mg by mouth daily.  01/19/19   Panosh, Standley Brooking, MD  ondansetron (ZOFRAN) 4 MG tablet Take 1 tablet (4 mg total) by mouth every 8 (eight) hours as needed for up to 15 doses for nausea or vomiting. 12/05/20   Curatolo, Adam, DO  pantoprazole (PROTONIX) 20 MG tablet Take 1 tablet (20 mg total) by mouth  daily for 20 days. 12/05/20 12/25/20  Curatolo, Adam, DO  sucralfate (CARAFATE) 1 g tablet Take 1 tablet (1 g total) by mouth 4 (four) times daily -  with meals and at bedtime for 10 days. 12/05/20 12/15/20  Curatolo, Adam, DO  VITAMIN D PO Take by mouth. Takes the gummies    [provider]  famotidine (PEPCID) 20 MG tablet Take 1 tablet (20 mg total) by mouth 2 (two) times daily. Take twice daily for 3 days, then as needed if symptoms recur Patient not taking: Reported on 11/28/2019 10/27/19 11/28/19  Charlann Lange, PA-C    Allergies    Patient has no known allergies.  Review of Systems   Review of Systems  All other systems reviewed and are negative.  Physical Exam Updated Vital Signs BP (!) 142/80 (BP  Location: Right Arm)    Pulse 80    Temp 98.5 F (36.9 C) (Oral)    Resp 18    SpO2 100%   Physical Exam Vitals and nursing note reviewed.  Constitutional:      General: She is not in acute distress.    Appearance: Normal appearance.  HENT:     Head: Normocephalic and atraumatic.     Right Ear: Tympanic membrane normal.     Left Ear: Ear canal normal. A middle ear effusion is present. No mastoid tenderness. Tympanic membrane is bulging. Tympanic membrane is not injected, scarred, perforated or erythematous.     Nose: Mucosal edema present.     Mouth/Throat:     Mouth: Mucous membranes are moist.  Eyes:     Extraocular Movements: Extraocular movements intact.     Pupils: Pupils are equal, round, and reactive to light.  Cardiovascular:     Rate and Rhythm: Normal rate.  Pulmonary:     Effort: Pulmonary effort is normal.  Musculoskeletal:     Cervical back: Normal range of motion and neck supple.  Skin:    General: Skin is warm and dry.  Neurological:     Mental Status: She is alert and oriented to person, place, and time. Mental status is at baseline.  Psychiatric:        Mood and Affect: Mood normal.        Behavior: Behavior normal.    ED Results / Procedures /  Treatments   Labs (all labs ordered are listed, but only abnormal results are displayed) Labs Reviewed - No data to display  EKG None  Radiology No results found.  Procedures Procedures   Medications Ordered in ED Medications - No data to display  ED Course  I have reviewed the triage vital signs and the nursing notes.  Pertinent labs & imaging results that were available during my care of the patient were reviewed by me and considered in my medical decision making (see chart for details).    MDM Rules/Calculators/A&P                         Patient presenting today with an uncomplicated left middle ear effusion.  No evidence of mastoid tenderness, otitis, rash concerning for zoster.  Patient given nasal spray and antihistamine.     Final Clinical Impression(s) / ED Diagnoses Final diagnoses:  Acute effusion of left ear    Rx / DC Orders ED Discharge Orders          Ordered    fluticasone (FLONASE) 50 MCG/ACT nasal spray  Daily        10/16/21 1132    loratadine (CLARITIN) 10 MG tablet  Daily        10/16/21 1132             Blanchie Dessert, MD 10/16/21 1139

## 2021-10-16 NOTE — ED Triage Notes (Signed)
Complains of feeling like she has fluid in her L ear since yesterday. Bulging TM upon inspection. Tender to movement. Pt reports HA and nasal congestion.

## 2021-11-26 ENCOUNTER — Ambulatory Visit (INDEPENDENT_AMBULATORY_CARE_PROVIDER_SITE_OTHER): Payer: Medicaid Other | Admitting: Internal Medicine

## 2021-11-26 ENCOUNTER — Encounter: Payer: Self-pay | Admitting: Internal Medicine

## 2021-11-26 VITALS — BP 114/70 | HR 74 | Temp 98.3°F | Ht 66.0 in | Wt 284.8 lb

## 2021-11-26 DIAGNOSIS — E8881 Metabolic syndrome: Secondary | ICD-10-CM

## 2021-11-26 DIAGNOSIS — Z23 Encounter for immunization: Secondary | ICD-10-CM

## 2021-11-26 DIAGNOSIS — Z79899 Other long term (current) drug therapy: Secondary | ICD-10-CM

## 2021-11-26 DIAGNOSIS — R6889 Other general symptoms and signs: Secondary | ICD-10-CM

## 2021-11-26 DIAGNOSIS — N898 Other specified noninflammatory disorders of vagina: Secondary | ICD-10-CM

## 2021-11-26 DIAGNOSIS — E282 Polycystic ovarian syndrome: Secondary | ICD-10-CM

## 2021-11-26 LAB — POCT GLYCOSYLATED HEMOGLOBIN (HGB A1C): Hemoglobin A1C: 5.7 % — AB (ref 4.0–5.6)

## 2021-11-26 NOTE — Progress Notes (Signed)
Chief Complaint  Patient presents with   Follow-up    Stomach pain after period    HPI: Glenda Mendez 30 y.o. come in  [place]  for  number of catch up reasons    Last visit  July 2021  had lost insurance and now back Has a numbner of ed visits for various acute problem s  Last  12 28 for acute effusion of left ear   rx augmentin  has seen audiology [facility] in Nov 22   ears feel clogged up.   Left still  bothersome. Still  Feels clogged .  No pain  cant hear out of right ear .   Periods   :vag  nl dc is thick .  And then stopped seems to be after period . Periods nl  now last 3- 5 days but normal  stopped  ocp recently  (   ran out of insurance )   no pain ocass itching   off and on .  Had gyne  in past  and put on  metformin  Fu got lost  doesn't remember who it was .   Denies risk of pregnancy and no sti. Living   [place] and  [place] , pet dog   Asthma doing ok  no    chest  sore at night x 2 last month     ROS: See pertinent positives and negatives per HPI. No cufrent cp sob   Past Medical History:  Diagnosis Date   ADHD (attention deficit hyperactivity disorder)    Allergy    Asthma    BMI (body mass index), pediatric, 95-99% for age 55/10/2010   Is starting to gain weight again and advised and counseled today about reduction for health risk reasons. Patient is very well aware of how to do lifestyle intervention she has done this before.    Hearing loss in right ear    with hearing aid bilateral    Hemiparesis (HCC)    rt from neonatal period   Hypoxia of newborn    Irregular periods 06/04/2011   PCOS (polycystic ovarian syndrome)    Pulmonary hemorrhage of fetus or newborn    44 week C-section [facility] of with birth hypoxia    Family History  Adopted: Yes  Problem Relation Age of Onset   Hypertension Other    Schizophrenia Mother        with alcohol drug abuse   Diabetes Other     Social History   Socioeconomic History   Marital status: Single     Spouse name: Not on file   Number of children: Not on file   Years of education: Not on file   Highest education level: Not on file  Occupational History   Not on file  Tobacco Use   Smoking status: Never   Smokeless tobacco: Never  Vaping Use   Vaping Use: Never used  Substance and Sexual Activity   Alcohol use: No    Alcohol/week: 0.0 standard drinks   Drug use: No   Sexual activity: Never    Birth control/protection: Pill  Other Topics Concern   Not on file  Social History Narrative   Adopted and raised by family member [name]   [employer] high school  graduate [employer] second semester   Living apt  [employer] other job    Sleep 8+ hours   Prev Dr. [name]- pulm at [facility] now has a new pulmonary Dr.  Now stable and on prn       Thyroid evaluation in the past neck evaluation within normal limits and no thyroid disease.   Net tad   Social Determinants of Health   Financial Resource Strain: Not on file  Food Insecurity: Not on file  Transportation Needs: Not on file  Physical Activity: Not on file  Stress: Not on file  Social Connections: Not on file    Outpatient Medications Prior to Visit  Medication Sig Dispense Refill   albuterol (PROVENTIL) (2.5 MG/3ML) 0.083% nebulizer solution Take 3 mLs (2.5 mg total) by nebulization every 4 (four) hours as needed for wheezing or shortness of breath. 75 mL 2   albuterol (VENTOLIN HFA) 108 (90 Base) MCG/ACT inhaler Inhale 2 puffs into the lungs every 6 (six) hours as needed for wheezing or shortness of breath. 18 g 3   benzonatate (TESSALON PERLES) 100 MG capsule Take 1 capsule (100 mg total) by mouth 3 (three) times daily as needed for cough. 30 capsule 0   EPINEPHrine 0.3 mg/0.3 mL IJ SOAJ injection Inject 0.3 mLs (0.3 mg total) into the muscle as needed for anaphylaxis. 1 each 0   fluticasone (FLONASE) 50 MCG/ACT nasal spray Place 2 sprays into both nostrils daily. 16 g 0   fluticasone-salmeterol (ADVAIR HFA) 230-21  MCG/ACT inhaler Inhale 2 puffs into the lungs 2 (two) times daily. 1 Inhaler 5   hydrOXYzine (ATARAX/VISTARIL) 25 MG tablet Take 1-2 tablets (25-50 mg total) by mouth every 6 (six) hours as needed for itching. 30 tablet 1   ipratropium (ATROVENT) 0.02 % nebulizer solution USE 1 VIAL BY NEBULIZATION 3 (THREE) TIMES DAILY AS NEEDED FOR WHEEZING. **DUE FOR ANNUAL VISIT June 2019** 62.5 mL 2   levocetirizine (XYZAL ALLERGY 24HR) 5 MG tablet Take 1 tablet (5 mg total) by mouth every evening. 90 tablet 3   loratadine (CLARITIN) 10 MG tablet Take 1 tablet (10 mg total) by mouth daily. 10 tablet 0   metFORMIN (GLUCOPHAGE-XR) 500 MG 24 hr tablet Take 1 tablet (500 mg total) by mouth daily with breakfast. 90 tablet 0   methylphenidate 36 MG PO CR tablet TAKE (2) TABLETS BY MOUTH DAILY. (Patient taking differently: Take 72 mg by mouth daily. ) 60 tablet 0   ondansetron (ZOFRAN) 4 MG tablet Take 1 tablet (4 mg total) by mouth every 8 (eight) hours as needed for up to 15 doses for nausea or vomiting. 15 tablet 0   pantoprazole (PROTONIX) 20 MG tablet Take 1 tablet (20 mg total) by mouth daily for 20 days. 20 tablet 0   sucralfate (CARAFATE) 1 g tablet Take 1 tablet (1 g total) by mouth 4 (four) times daily -  with meals and at bedtime for 10 days. 40 tablet 0   VITAMIN D PO Take by mouth. Takes the gummies     No facility-administered medications prior to visit.     EXAM:  BP 114/70 (BP Location: Left Arm, Patient Position: Sitting, Cuff Size: Normal)    Pulse 74    Temp 98.3 F (36.8 C) (Oral)    Ht 5\' 6"  (1.676 m)    Wt 284 lb 12.8 oz (129.2 kg)    LMP 11/21/2021 (Exact Date)    SpO2 100%    BMI 45.97 kg/m   Body mass index is 45.97 kg/m.  GENERAL: vitals reviewed and listed above, alert, oriented, appears well hydrated and in no acute distress HEENT: atraumatic, conjunctiva  clear, no obvious abnormalities on inspection of external  nose and ears r tm intact  left tm shiny  grey but light relfex  inferior  eac clear    NECK: no obvious masses on inspection palpation  LUNGS: clear to auscultation bilaterally, no wheezes, rales or rhonchi, good air movement CV: HRRR, no clubbing cyanosis nl cap refill  MS: moves all extremities without noticeable focal  abnormality PSYCH: pleasant and cooperative, no obvious depression or anxiety Lab Results  Component Value Date   WBC 5.7 07/06/2021   HGB 11.3 (L) 07/06/2021   HCT 37.1 07/06/2021   PLT 428 (H) 07/06/2021   GLUCOSE 91 07/06/2021   CHOL 161 04/25/2020   TRIG 41.0 04/25/2020   HDL 58.20 04/25/2020   LDLCALC 95 04/25/2020   ALT 28 07/06/2021   AST 23 07/06/2021   NA 139 07/06/2021   K 3.6 07/06/2021   CL 106 07/06/2021   CREATININE 0.94 07/06/2021   BUN 8 07/06/2021   CO2 25 07/06/2021   TSH 1.51 04/25/2020   HGBA1C 5.7 (A) 11/26/2021   BP Readings from Last 3 Encounters:  11/26/21 114/70  10/16/21 (!) 142/80  07/16/21 (!) 150/87    ASSESSMENT AND PLAN:  Discussed the following assessment and plan:  Ear symptom - left  Insulin resistance - a1c 5.7 down from 6.0 - Plan: Flu Vaccine QUAD 6+ mos PF IM (Fluarix Quad PF), POC HgB A1c  PCOS (polycystic ovarian syndrome)  Severe obesity (BMI >= 40) (HCC)  Medication management  Vaginal discharge cyclical - may be physiologic  or other  options today no sx . advise gyne check either way due Record review   lack of primary care  over past 18 months  Had se ? Ha of ocps  periods better now but advise gyne check and fu  meds  etc.  Fu at [facility] for ear sx ( hearing ear)   Gyne referral for  routine plus meds plus  exam etc.number of issues  Plan rov in 4-6 months and plan address any missing  or under addressed issues . Such as obesity .  -Patient advised to return or notify health care team  if  new concerns arise.  Patient Instructions  Good to see  you today . Will do new gyne referral for  problems  pcos and se of ocps and cyclkic vaginal sx that could be  normal.  See your [facility] people for the left ear  ongoing sx.   Healthy weight loss  will help future health .  Can have you fu in 4-6 months  .    Standley Brooking. Wilbert Hayashi M.D.

## 2021-11-26 NOTE — Patient Instructions (Addendum)
Good to see  you today . Will do new gyne referral for  problems  pcos and se of ocps and cyclkic vaginal sx that could be normal.  See your [facility] people for the left ear  ongoing sx.   Healthy weight loss  will help future health .  Can have you fu in 4-6 months  .

## 2021-12-20 ENCOUNTER — Ambulatory Visit (INDEPENDENT_AMBULATORY_CARE_PROVIDER_SITE_OTHER): Payer: Self-pay | Admitting: Family Medicine

## 2021-12-20 ENCOUNTER — Encounter: Payer: Self-pay | Admitting: Family Medicine

## 2021-12-20 VITALS — BP 113/78 | HR 91 | Temp 98.7°F | Wt 287.6 lb

## 2021-12-20 DIAGNOSIS — J069 Acute upper respiratory infection, unspecified: Secondary | ICD-10-CM

## 2021-12-20 DIAGNOSIS — J020 Streptococcal pharyngitis: Secondary | ICD-10-CM

## 2021-12-20 DIAGNOSIS — R519 Headache, unspecified: Secondary | ICD-10-CM

## 2021-12-20 DIAGNOSIS — J040 Acute laryngitis: Secondary | ICD-10-CM

## 2021-12-20 LAB — POCT INFLUENZA A/B
Influenza A, POC: NEGATIVE
Influenza B, POC: NEGATIVE

## 2021-12-20 LAB — POCT RAPID STREP A (OFFICE): Rapid Strep A Screen: POSITIVE — AB

## 2021-12-20 LAB — POC COVID19 BINAXNOW: SARS Coronavirus 2 Ag: NEGATIVE

## 2021-12-20 MED ORDER — PREDNISONE 20 MG PO TABS: 40.0000 mg | ORAL_TABLET | Freq: Every day | ORAL | 0 refills | Status: AC

## 2021-12-20 MED ORDER — AMOXICILLIN-POT CLAVULANATE 500-125 MG PO TABS: 1.0000 | ORAL_TABLET | Freq: Two times a day (BID) | ORAL | 0 refills | Status: AC

## 2021-12-20 NOTE — Progress Notes (Signed)
Subjective:  ? ? Patient ID: Glenda Mendez, female    DOB: 1991-08-19, 31 y.o.   MRN: [id number] ? ?Chief Complaint  ?Patient presents with  ? losing voice  ?  This morning, throat also sore started last night, has had headache for 3 days. Ibuprofen for the headache, lemon water for the throat and voice.  ? ? ?HPI ?Patient was seen today for acute concern.  Pt with frontal headache x3 days.  Patient woke up this morning with decreased appetite, sore throat, loss of voice, rhinorrhea, slight cough.  Patient denies sick contacts, fever, chills, nausea, vomiting, diarrhea.  Patient has tried lemon water and ibuprofen for her symptoms. ? ?Past Medical History:  ?Diagnosis Date  ? ADHD (attention deficit hyperactivity disorder)   ? Allergy   ? Asthma   ? BMI (body mass index), pediatric, 95-99% for age 52/10/2010  ? Is starting to gain weight again and advised and counseled today about reduction for health risk reasons. Patient is very well aware of how to do lifestyle intervention she has done this before.   ? Hearing loss in right ear   ? with hearing aid bilateral   ? Hemiparesis ([facility])   ? rt from neonatal period  ? Hypoxia of newborn   ? Irregular periods 06/04/2011  ? PCOS (polycystic ovarian syndrome)   ? Pulmonary hemorrhage of fetus or newborn   ? 18 week C-section [facility] of with birth hypoxia  ? ? ?No Known Allergies ? ?ROS ?General: Denies fever, chills, night sweats, changes in weight +changes in appetite  ?HEENT: Denies headaches, ear pain, changes in vision, rhinorrhea, sore throat  + sore throat, hoarse voice, HA, rhinorrhea, decreased hearing ?CV: Denies CP, palpitations, SOB, orthopnea ?Pulm: Denies SOB,  wheezing + cough ?GI: Denies abdominal pain, nausea, vomiting, diarrhea, constipation ?GU: Denies dysuria, hematuria, frequency, vaginal discharge ?Msk: Denies muscle cramps, joint pains ?Neuro: Denies weakness, numbness, tingling ?Skin: Denies rashes, bruising ?Psych: Denies depression, anxiety,  hallucinations ?   ?Objective:  ?  ?Blood pressure 113/78, pulse 91, temperature 98.7 ?F (37.1 ?C), temperature source Oral, weight 287 lb 9.6 oz (130.5 kg), last menstrual period 11/21/2021, SpO2 99 %. ? ?Gen. Pleasant, well-nourished, in no distress, normal affect   ?HEENT: [place]/AT, face symmetric, conjunctiva clear, no scleral icterus, PERRLA, EOMI, nares patent with clear drainage, tonsils absent.  Pharynx without erythema or exudate.  TTP over frontal and maxillary sinuses.  TMs full bilaterally.  No cervical lymphadenopathy.  Mild thyromegaly. ?Lungs: no accessory muscle use, CTAB, audible whistle sound on exhalation, no wheezes or rales ?Cardiovascular: RRR, no m/r/g, no peripheral edema ?Musculoskeletal: No deformities, no cyanosis or clubbing, normal tone ?Neuro:  A&Ox3, CN II-XII intact, normal gait ?Skin:  Warm, no lesions/ rash ? ? ?Wt Readings from Last 3 Encounters:  ?12/20/21 287 lb 9.6 oz (130.5 kg)  ?11/26/21 284 lb 12.8 oz (129.2 kg)  ?07/16/21 272 lb 4.3 oz (123.5 kg)  ? ? ?Lab Results  ?Component Value Date  ? WBC 5.7 07/06/2021  ? HGB 11.3 (L) 07/06/2021  ? HCT 37.1 07/06/2021  ? PLT 428 (H) 07/06/2021  ? GLUCOSE 91 07/06/2021  ? CHOL 161 04/25/2020  ? TRIG 41.0 04/25/2020  ? HDL 58.20 04/25/2020  ? [place] 95 04/25/2020  ? ALT 28 07/06/2021  ? AST 23 07/06/2021  ? NA 139 07/06/2021  ? K 3.6 07/06/2021  ? CL 106 07/06/2021  ? CREATININE 0.94 07/06/2021  ? BUN 8 07/06/2021  ? CO2 25 07/06/2021  ?  TSH 1.51 04/25/2020  ? HGBA1C 5.7 (A) 11/26/2021  ? ? ?Assessment/Plan: ? ?Strep pharyngitis  ?-rapid strep test positive in clinic ?-start abx ?-supportive care including gargling ?- Plan: amoxicillin-clavulanate (AUGMENTIN) 500-125 MG tablet ? ?Laryngitis  ?-given note for work ?- Plan: predniSONE (DELTASONE) 20 MG tablet ? ?Acute nonintractable headache, unspecified headache type  ?-flu and covid testing negative ?-Tylenol prn ?- Plan: POC COVID-19, POC Influenza A/B, POC Rapid Strep A ? ?Viral URI with  cough ?-treatment of sx w/ OTC cough and cold meds. ?-abx for sore throat 2/2 strep pharyngitis ? ?F/u prn ? ?Grier Mitts, MD ?

## 2021-12-27 ENCOUNTER — Encounter (HOSPITAL_COMMUNITY): Payer: Self-pay | Admitting: Emergency Medicine

## 2021-12-27 ENCOUNTER — Emergency Department (HOSPITAL_COMMUNITY): Payer: Self-pay

## 2021-12-27 ENCOUNTER — Emergency Department (HOSPITAL_COMMUNITY)
Admission: EM | Admit: 2021-12-27 | Discharge: 2021-12-27 | Disposition: A | Discharge status: Home / Self Care | Payer: Self-pay | Attending: Emergency Medicine | Admitting: Emergency Medicine

## 2021-12-27 DIAGNOSIS — R0789 Other chest pain: Secondary | ICD-10-CM | POA: Insufficient documentation

## 2021-12-27 DIAGNOSIS — R630 Anorexia: Secondary | ICD-10-CM | POA: Insufficient documentation

## 2021-12-27 DIAGNOSIS — Z7951 Long term (current) use of inhaled steroids: Secondary | ICD-10-CM | POA: Insufficient documentation

## 2021-12-27 DIAGNOSIS — R778 Other specified abnormalities of plasma proteins: Secondary | ICD-10-CM | POA: Insufficient documentation

## 2021-12-27 DIAGNOSIS — R1084 Generalized abdominal pain: Secondary | ICD-10-CM

## 2021-12-27 DIAGNOSIS — K920 Hematemesis: Secondary | ICD-10-CM | POA: Insufficient documentation

## 2021-12-27 DIAGNOSIS — J45909 Unspecified asthma, uncomplicated: Secondary | ICD-10-CM | POA: Insufficient documentation

## 2021-12-27 DIAGNOSIS — K921 Melena: Secondary | ICD-10-CM | POA: Insufficient documentation

## 2021-12-27 DIAGNOSIS — A084 Viral intestinal infection, unspecified: Secondary | ICD-10-CM | POA: Insufficient documentation

## 2021-12-27 LAB — CBC WITH DIFFERENTIAL/PLATELET
Abs Immature Granulocytes: 0.02 10*3/uL (ref 0.00–0.07)
Basophils Absolute: 0 10*3/uL (ref 0.0–0.1)
Basophils Relative: 0 %
Eosinophils Absolute: 0.2 10*3/uL (ref 0.0–0.5)
Eosinophils Relative: 3 %
HCT: 36.7 % (ref 36.0–46.0)
Hemoglobin: 11.3 g/dL — ABNORMAL LOW (ref 12.0–15.0)
Immature Granulocytes: 0 %
Lymphocytes Relative: 40 %
Lymphs Abs: 2.8 10*3/uL (ref 0.7–4.0)
MCH: 21.6 pg — ABNORMAL LOW (ref 26.0–34.0)
MCHC: 30.8 g/dL (ref 30.0–36.0)
MCV: 70.3 fL — ABNORMAL LOW (ref 80.0–100.0)
Monocytes Absolute: 0.5 10*3/uL (ref 0.1–1.0)
Monocytes Relative: 7 %
Neutro Abs: 3.5 10*3/uL (ref 1.7–7.7)
Neutrophils Relative %: 50 %
Platelets: 431 10*3/uL — ABNORMAL HIGH (ref 150–400)
RBC: 5.22 MIL/uL — ABNORMAL HIGH (ref 3.87–5.11)
RDW: 15.6 % — ABNORMAL HIGH (ref 11.5–15.5)
WBC: 7.1 10*3/uL (ref 4.0–10.5)
nRBC: 0 % (ref 0.0–0.2)

## 2021-12-27 LAB — COMPREHENSIVE METABOLIC PANEL
ALT: 24 U/L (ref 0–44)
AST: 17 U/L (ref 15–41)
Albumin: 3.5 g/dL (ref 3.5–5.0)
Alkaline Phosphatase: 111 U/L (ref 38–126)
Anion gap: 6 (ref 5–15)
BUN: 11 mg/dL (ref 6–20)
CO2: 26 mmol/L (ref 22–32)
Calcium: 8.7 mg/dL — ABNORMAL LOW (ref 8.9–10.3)
Chloride: 104 mmol/L (ref 98–111)
Creatinine, Ser: 0.79 mg/dL (ref 0.44–1.00)
GFR, Estimated: >60 mL/min (ref >60)
Glucose, Bld: 90 mg/dL (ref 70–99)
Potassium: 3.5 mmol/L (ref 3.5–5.1)
Sodium: 136 mmol/L (ref 135–145)
Total Bilirubin: 0.1 mg/dL — ABNORMAL LOW (ref 0.3–1.2)
Total Protein: 7.2 g/dL (ref 6.5–8.1)

## 2021-12-27 LAB — LIPASE, BLOOD: Lipase: 59 U/L — ABNORMAL HIGH (ref 11–51)

## 2021-12-27 LAB — TROPONIN I (HIGH SENSITIVITY)
Troponin I (High Sensitivity): <2 ng/L (ref <18)
Troponin I (High Sensitivity): <2 ng/L (ref <18)

## 2021-12-27 LAB — POC OCCULT BLOOD, ED: Fecal Occult Bld: NEGATIVE

## 2021-12-27 LAB — I-STAT BETA HCG BLOOD, ED (MC, WL, AP ONLY): I-stat hCG, quantitative: <5 m[IU]/mL (ref <5)

## 2021-12-27 MED ORDER — ONDANSETRON 4 MG PO TBDP
4.0000 mg | ORAL_TABLET | Freq: Once | ORAL | Status: AC
  Administered 2021-12-27: 4 mg via ORAL
  Filled 2021-12-27: qty 1

## 2021-12-27 MED ORDER — IOHEXOL 300 MG/ML  SOLN
100.0000 mL | Freq: Once | INTRAMUSCULAR | Status: AC | PRN
  Administered 2021-12-27: 100 mL via INTRAVENOUS

## 2021-12-27 MED ORDER — MORPHINE SULFATE (PF) 4 MG/ML IV SOLN
4.0000 mg | Freq: Once | INTRAVENOUS | Status: AC
  Administered 2021-12-27: 4 mg via INTRAVENOUS
  Filled 2021-12-27: qty 1

## 2021-12-27 MED ORDER — PANTOPRAZOLE SODIUM 40 MG IV SOLR
40.0000 mg | Freq: Once | INTRAVENOUS | Status: AC
  Administered 2021-12-27: 40 mg via INTRAVENOUS
  Filled 2021-12-27: qty 10

## 2021-12-27 MED ORDER — DICYCLOMINE HCL 20 MG PO TABS: 20.0000 mg | ORAL_TABLET | Freq: Two times a day (BID) | ORAL | 0 refills | Status: DC

## 2021-12-27 MED ORDER — PANTOPRAZOLE SODIUM 40 MG PO TBEC: 40.0000 mg | DELAYED_RELEASE_TABLET | Freq: Two times a day (BID) | ORAL | 0 refills | Status: DC

## 2021-12-27 MED ORDER — ONDANSETRON 4 MG PO TBDP
4.0000 mg | ORAL_TABLET | Freq: Three times a day (TID) | ORAL | 0 refills | Status: AC | PRN
Start: 2021-12-27 — End: ?

## 2021-12-27 NOTE — ED Triage Notes (Addendum)
States she has been vomiting since 6 am-noticed blood in her vomit-per patient, states she recently finished antibiotics and prednisone for strep throat ?

## 2021-12-27 NOTE — ED Provider Notes (Cosign Needed Addendum)
[facility] DEPT Provider Note   CSN: [id number] Arrival date & time: 12/27/21  1004     History  Chief Complaint  Patient presents with   Emesis    Glenda Mendez is a 31 y.o. female.  31 year old female presents today for evaluation of abdominal pain, emesis of 2-day duration and hematemesis since this morning.  She reports 3 episodes of hematemesis.  She also reports since this morning she had dark red stools.  She endorses lack of appetite for the past couple days.  She states she had strep pharyngitis 1 week ago and was given antibiotics.  She reports improvement of her pharyngitis.  She has not had any URI symptoms since then.  She states she developed abdominal pain 2 days ago along with chest tightness.  These episodes occasionally have woken her up in the middle of the night.  She is unable to identify any alleviating or aggravating factors.  She denies drug use, alcohol use, or significant NSAID use.  She states she occasionally takes NSAID for headache but has not in the past month.  The history is provided by the patient. No language interpreter was used.      Home Medications Prior to Admission medications   Medication Sig Start Date End Date Taking? Authorizing Provider  albuterol (PROVENTIL) (2.5 MG/3ML) 0.083% nebulizer solution Take 3 mLs (2.5 mg total) by nebulization every 4 (four) hours as needed for wheezing or shortness of breath. 06/22/19   Hedges, Dellis Filbert, PA-C  albuterol (VENTOLIN HFA) 108 (90 Base) MCG/ACT inhaler Inhale 2 puffs into the lungs every 6 (six) hours as needed for wheezing or shortness of breath. 06/22/19   Hedges, Dellis Filbert, PA-C  amoxicillin-clavulanate (AUGMENTIN) 500-125 MG tablet Take 1 tablet (500 mg total) by mouth in the morning and at bedtime for 10 days. 12/20/21 12/30/21  Billie Ruddy, MD  benzonatate (TESSALON PERLES) 100 MG capsule Take 1 capsule (100 mg total) by mouth 3 (three) times daily as needed for  cough. 12/03/20   Burchette, Alinda Sierras, MD  EPINEPHrine 0.3 mg/0.3 mL IJ SOAJ injection Inject 0.3 mLs (0.3 mg total) into the muscle as needed for anaphylaxis. 04/25/20   Panosh, Standley Brooking, MD  fluticasone (FLONASE) 50 MCG/ACT nasal spray Place 2 sprays into both nostrils daily. 10/16/21   Blanchie Dessert, MD  fluticasone-salmeterol (ADVAIR HFA) 230-21 MCG/ACT inhaler Inhale 2 puffs into the lungs 2 (two) times daily. 08/31/18   Brand Males, MD  hydrOXYzine (ATARAX/VISTARIL) 25 MG tablet Take 1-2 tablets (25-50 mg total) by mouth every 6 (six) hours as needed for itching. 04/25/20   Panosh, Standley Brooking, MD  ipratropium (ATROVENT) 0.02 % nebulizer solution USE 1 VIAL BY NEBULIZATION 3 (THREE) TIMES DAILY AS NEEDED FOR WHEEZING. **DUE FOR ANNUAL VISIT June 2019** 06/22/19   Hedges, Dellis Filbert, PA-C  levocetirizine (XYZAL ALLERGY 24HR) 5 MG tablet Take 1 tablet (5 mg total) by mouth every evening. 02/18/17   Nafziger, [name], NP  loratadine (CLARITIN) 10 MG tablet Take 1 tablet (10 mg total) by mouth daily. 10/16/21   Blanchie Dessert, MD  metFORMIN (GLUCOPHAGE-XR) 500 MG 24 hr tablet Take 1 tablet (500 mg total) by mouth daily with breakfast. 10/19/18 11/27/28  Panosh, Standley Brooking, MD  methylphenidate 36 MG PO CR tablet TAKE (2) TABLETS BY MOUTH DAILY. Patient taking differently: Take 72 mg by mouth daily. 01/19/19   Panosh, Standley Brooking, MD  ondansetron (ZOFRAN) 4 MG tablet Take 1 tablet (4 mg total) by mouth every  8 (eight) hours as needed for up to 15 doses for nausea or vomiting. 12/05/20   Curatolo, Adam, DO  pantoprazole (PROTONIX) 20 MG tablet Take 1 tablet (20 mg total) by mouth daily for 20 days. 12/05/20 12/25/20  Curatolo, Adam, DO  sucralfate (CARAFATE) 1 g tablet Take 1 tablet (1 g total) by mouth 4 (four) times daily -  with meals and at bedtime for 10 days. 12/05/20 12/15/20  Curatolo, Adam, DO  VITAMIN D PO Take by mouth. Takes the gummies    [provider]  famotidine (PEPCID) 20 MG tablet Take 1 tablet (20  mg total) by mouth 2 (two) times daily. Take twice daily for 3 days, then as needed if symptoms recur Patient not taking: Reported on 11/28/2019 10/27/19 11/28/19  Charlann Lange, PA-C      Allergies    Patient has no known allergies.    Review of Systems   Review of Systems  Constitutional:  Positive for appetite change. Negative for activity change, chills and fever.  Respiratory:  Negative for cough and shortness of breath.   Cardiovascular:  Positive for chest pain.  Gastrointestinal:  Positive for abdominal pain, nausea and vomiting.  Genitourinary:  Negative for dysuria and vaginal discharge.  Musculoskeletal:  Negative for back pain.  Neurological:  Negative for facial asymmetry and weakness.  All other systems reviewed and are negative.  Physical Exam Updated Vital Signs BP (!) 96/44    Pulse 81    Temp 98 F (36.7 C) (Oral)    Resp 14    LMP 12/26/2021    SpO2 100%  Physical Exam Vitals and nursing note reviewed.  Constitutional:      General: She is not in acute distress.    Appearance: Normal appearance. She is not ill-appearing.  HENT:     Head: Normocephalic and atraumatic.     Nose: Nose normal.  Eyes:     General: No scleral icterus.    Extraocular Movements: Extraocular movements intact.     Conjunctiva/sclera: Conjunctivae normal.  Cardiovascular:     Rate and Rhythm: Normal rate and regular rhythm.     Pulses: Normal pulses.     Heart sounds: Normal heart sounds.  Pulmonary:     Effort: Pulmonary effort is normal. No respiratory distress.     Breath sounds: Normal breath sounds. No wheezing or rales.  Abdominal:     General: There is no distension.     Palpations: Abdomen is soft.     Tenderness: There is abdominal tenderness. There is no right CVA tenderness, left CVA tenderness or guarding.  Musculoskeletal:        General: Normal range of motion.     Cervical back: Normal range of motion.     Right lower leg: No edema.     Left lower leg: No edema.   Skin:    General: Skin is warm and dry.  Neurological:     General: No focal deficit present.     Mental Status: She is alert. Mental status is at baseline.    ED Results / Procedures / Treatments   Labs (all labs ordered are listed, but only abnormal results are displayed) Labs Reviewed  CBC WITH DIFFERENTIAL/PLATELET  COMPREHENSIVE METABOLIC PANEL  LIPASE, BLOOD  POC OCCULT BLOOD, ED  TROPONIN I (HIGH SENSITIVITY)    EKG None  Radiology DG Chest 2 View  Result Date: 12/27/2021 CLINICAL DATA:  Chest pain EXAM: CHEST - 2 VIEW COMPARISON:  12/05/2020 chest radiograph.  FINDINGS: Stable cardiomediastinal silhouette with normal heart size. No pneumothorax. No pleural effusion. Lungs appear clear, with no acute consolidative airspace disease and no pulmonary edema. IMPRESSION: No active cardiopulmonary disease. Electronically Signed   By: Ilona Sorrel M.D.   On: 12/27/2021 11:57    Procedures Procedures    Medications Ordered in ED Medications - No data to display  ED Course/ Medical Decision Making/ A&P Clinical Course as of 12/27/21 1529  Fri Dec 27, 2021  1449 CBC without leukocytosis.  Hemoglobin 11.3 and at baseline.  No additional episodes of hematemesis or emesis in the emergency room.  Reports improvement in abdominal pain following pain medication.  CMP with calcium 8.7 otherwise without electrolyte derangement or renal insufficiency.  Lipase mildly elevated at 59.  Troponin negative.  Chest x-ray without acute cardiopulm process.  EKG without acute ischemic changes.  Hemoccult negative.  Awaiting CT abdomen pelvis. [AA]    Clinical Course User Index [AA] Evlyn Courier, PA-C                           Medical Decision Making Amount and/or Complexity of Data Reviewed Labs: ordered. Radiology: ordered.  Risk Prescription drug management.   Medical Decision Making / ED Course   This patient presents to the ED for concern of abdominal pain, chest pain, hematemesis,  this involves an extensive number of treatment options, and is a complaint that carries with it a high risk of complications and morbidity.  The differential diagnosis includes acute upper GI bleed including gastric ulcer, Mallory-Weiss tear, lower GI bleed, ACS, pneumonia, viral gastroenteritis  MDM: 31 year old female presents today for evaluation of abdominal pain, emesis of 2-day duration and hematemesis since this morning.  3 episodes of grossly bloody hematemesis this morning.  She also reports dark red stools as of this morning.  She has had some chest pain with the abdominal pain over the past 2 days.  We will do ACS work-up, with CBC, CMP, lipase, CT abdomen pelvis. Troponin negative.  EKG without acute ischemic changes.  CBC without drop in hemoglobin beyond baseline.  Hemoccult negative.  Potential Mallory-Weiss tear given hematemesis following 2 days of emesis.  Potential viral gastroenteritis as a source of abdominal pain and nausea vomiting for the past couple days.  Vitals remained stable during the emergency room stay.  Symptoms improved after Zofran and morphine in the emergency room.  CT abdomen pelvis without acute intra-abdominal findings.  Patient is stable for follow-up outpatient for further work-up and management.  Return precautions discussed.  Patient voices understanding and is in agreement with plan.   Lab Tests: -I ordered, reviewed, and interpreted labs.   The pertinent results include:   Labs Reviewed  CBC WITH DIFFERENTIAL/PLATELET - Abnormal; Notable for the following components:      Result Value   RBC 5.22 (*)    Hemoglobin 11.3 (*)    MCV 70.3 (*)    MCH 21.6 (*)    RDW 15.6 (*)    Platelets 431 (*)    All other components within normal limits  COMPREHENSIVE METABOLIC PANEL - Abnormal; Notable for the following components:   Calcium 8.7 (*)    Total Bilirubin 0.1 (*)    All other components within normal limits  LIPASE, BLOOD - Abnormal; Notable for the  following components:   Lipase 59 (*)    All other components within normal limits  POC OCCULT BLOOD, ED  I-STAT BETA HCG BLOOD,  ED ([facility], [facility], [facility] ONLY)  TROPONIN I (HIGH SENSITIVITY)  TROPONIN I (HIGH SENSITIVITY)      EKG  EKG Interpretation  Date/Time:    Ventricular Rate:    PR Interval:    QRS Duration:   QT Interval:    QTC Calculation:   R Axis:     Text Interpretation:          Imaging Studies ordered: I ordered imaging studies including chest x-ray, CT abdomen pelvis with contrast I independently visualized and interpreted imaging. I agree with the radiologist interpretation   Medicines ordered and prescription drug management: Meds ordered this encounter  Medications   ondansetron (ZOFRAN-ODT) disintegrating tablet 4 mg   morphine (PF) 4 MG/ML injection 4 mg   iohexol (OMNIPAQUE) 300 MG/ML solution 100 mL   ondansetron (ZOFRAN-ODT) 4 MG disintegrating tablet    Sig: Take 1 tablet (4 mg total) by mouth every 8 (eight) hours as needed for nausea or vomiting.    Dispense:  20 tablet    Refill:  0    Order Specific Question:   Supervising Provider    Answer:   Sabra Heck, BRIAN [[id number]]   dicyclomine (BENTYL) 20 MG tablet    Sig: Take 1 tablet (20 mg total) by mouth 2 (two) times daily.    Dispense:  20 tablet    Refill:  0    Order Specific Question:   Supervising Provider    Answer:   MILLER, BRIAN [[id number]]   pantoprazole (PROTONIX) injection 40 mg   pantoprazole (PROTONIX) 40 MG tablet    Sig: Take 1 tablet (40 mg total) by mouth 2 (two) times daily.    Dispense:  60 tablet    Refill:  0    Order Specific Question:   Supervising Provider    Answer:   MILLER, [place]    -I have reviewed the patients home medicines and have made adjustments as needed  Cardiac Monitoring: The patient was maintained on a cardiac monitor.  I personally viewed and interpreted the cardiac monitored which showed an underlying rhythm of: Normal sinus  rhythm  Reevaluation: After the interventions noted above, I reevaluated the patient and found that they have :improved  Co morbidities that complicate the patient evaluation  Past Medical History:  Diagnosis Date   ADHD (attention deficit hyperactivity disorder)    Allergy    Asthma    BMI (body mass index), pediatric, 95-99% for age 59/10/2010   Is starting to gain weight again and advised and counseled today about reduction for health risk reasons. Patient is very well aware of how to do lifestyle intervention she has done this before.    Hearing loss in right ear    with hearing aid bilateral    Hemiparesis (HCC)    rt from neonatal period   Hypoxia of newborn    Irregular periods 06/04/2011   PCOS (polycystic ovarian syndrome)    Pulmonary hemorrhage of fetus or newborn    71 week C-section [facility] of with birth hypoxia      Dispostion: Patient is stable for discharge.  Discharged in stable condition.  Return precautions discussed.    Final Clinical Impression(s) / ED Diagnoses Final diagnoses:  Generalized abdominal pain  Hematemesis with nausea    Rx / DC Orders ED Discharge Orders          Ordered    ondansetron (ZOFRAN-ODT) 4 MG disintegrating tablet  Every 8 hours PRN        12/27/21  1517    dicyclomine (BENTYL) 20 MG tablet  2 times daily        12/27/21 1517    pantoprazole (PROTONIX) 40 MG tablet  2 times daily        12/27/21 1529              Evlyn Courier, PA-C 12/27/21 1535    [address], [place], [place] 12/27/21 1539

## 2021-12-27 NOTE — Discharge Instructions (Addendum)
Your work-up in the emergency room was reassuring.  Your blood work did not show drop in your hemoglobin compared to your baseline.  Your electrolytes were normal.  Your EKG, chest x-ray were also without concern.  You received Zofran and pain medication in the emergency room with improvement in your symptoms.  He had a rectal exam done to evaluate for any microscopic bleeding.  This was negative.  He had a CT scan done today.  This was without any concerning findings to explain your bleeding.  I have given you a referral to gastroenterology.  Please call their office and schedule a follow-up appointment.  You can also follow-up with your primary care provider.  I have sent in Zofran and Bentyl to the pharmacy for symptom management.  if you have worsening chest pain, abdominal pain, fever, or bleeding please return to the emergency room. ?

## 2022-01-23 ENCOUNTER — Encounter: Payer: Self-pay | Admitting: Obstetrics and Gynecology

## 2022-01-23 ENCOUNTER — Other Ambulatory Visit (HOSPITAL_COMMUNITY)
Admission: RE | Admit: 2022-01-23 | Discharge: 2022-01-23 | Disposition: A | Discharge status: Home / Self Care | Payer: Medicaid Other | Source: Ambulatory Visit | Attending: Obstetrics and Gynecology | Admitting: Obstetrics and Gynecology

## 2022-01-23 ENCOUNTER — Ambulatory Visit (INDEPENDENT_AMBULATORY_CARE_PROVIDER_SITE_OTHER): Payer: Medicaid Other | Admitting: Obstetrics and Gynecology

## 2022-01-23 VITALS — BP 123/70 | HR 70 | Ht 67.0 in | Wt 282.3 lb

## 2022-01-23 DIAGNOSIS — Z01419 Encounter for gynecological examination (general) (routine) without abnormal findings: Secondary | ICD-10-CM

## 2022-01-23 DIAGNOSIS — N898 Other specified noninflammatory disorders of vagina: Secondary | ICD-10-CM | POA: Diagnosis not present

## 2022-01-23 DIAGNOSIS — Z113 Encounter for screening for infections with a predominantly sexual mode of transmission: Secondary | ICD-10-CM | POA: Insufficient documentation

## 2022-01-23 DIAGNOSIS — Z0000 Encounter for general adult medical examination without abnormal findings: Secondary | ICD-10-CM

## 2022-01-23 DIAGNOSIS — Z30011 Encounter for initial prescription of contraceptive pills: Secondary | ICD-10-CM

## 2022-01-23 DIAGNOSIS — N926 Irregular menstruation, unspecified: Secondary | ICD-10-CM

## 2022-01-23 MED ORDER — NORGESTIMATE-ETH ESTRADIOL 0.25-35 MG-MCG PO TABS
1.0000 | ORAL_TABLET | Freq: Every day | ORAL | 12 refills | Status: DC
Start: 2022-01-23 — End: 2024-07-27

## 2022-01-23 NOTE — Progress Notes (Signed)
? ?WELL-WOMAN PHYSICAL & PAP ?Patient name: Glenda Mendez MRN [id number]  Date of birth: 1991-05-01 ?Chief Complaint:   ?New Patient (Initial Visit) ? ?History of Present Illness:   ?Glenda Mendez is a 31 y.o. G0P0000 African American female being seen today for a routine well-woman exam.  ?Current complaints: irregular menstrual cycles, pap, STI testing, and "cottage cheese" vaginal d/c x 2 weeks. ? ?PCP: Shanon Ace, MD      ?does not desire labs ?Patient's last menstrual period was 01/17/2022. ?The current method of family planning is condoms.  ?Last pap 04/02/2018. Results were: normal ?Last mammogram: n/a. Family h/o breast cancer: No ?Last colonoscopy: n/a. Family h/o colorectal cancer: No ?Review of Systems:   ?Pertinent items are noted in HPI ?Denies any headaches, blurred vision, fatigue, shortness of breath, chest pain, abdominal pain, abnormal vaginal discharge/itching/odor/irritation, problems with periods, bowel movements, urination, or intercourse unless otherwise stated above. ?Pertinent History Reviewed:  ?Reviewed past medical,surgical, social and family history.  ?Reviewed problem list, medications and allergies. ?Physical Assessment:  ? ?Vitals:  ? 01/23/22 1019  ?BP: 123/70  ?Pulse: 70  ?Weight: 282 lb 4.8 oz (128.1 kg)  ?Height: '5\' 7"'$  (1.702 m)  ?Body mass index is 44.21 kg/m?. ?  ?     Physical Examination:  ? General appearance - well appearing, and in no distress ? Mental status - alert, oriented to person, place, and time ? Psych:  She has a normal mood and affect ? Skin - warm and dry, normal color, no suspicious lesions noted ? Chest - effort normal, all lung fields clear to auscultation bilaterally ? Heart - normal rate and regular rhythm ? Neck:  midline trachea, no thyromegaly or nodules ? Breasts - breasts appear normal, no suspicious masses, no skin or nipple changes or  axillary nodes ? Abdomen - soft, nontender, nondistended, no masses or organomegaly ? Pelvic - VULVA: normal  appearing vulva with no masses, tenderness or lesions  VAGINA: normal appearing vagina with normal color and discharge, no lesions  CERVIX: normal appearing cervix without discharge or lesions, no CMT ? Thin prep pap is done with reflex HR HPV cotesting ? UTERUS: uterus is felt to be normal size, shape, consistency and nontender  ? ADNEXA: No adnexal masses or tenderness noted. ? Rectal - deferred ? Extremities:  No swelling or varicosities noted ? ?No results found for this or any previous visit (from the past 24 hour(s)).  ?Assessment & Plan:  ?1) Encounter for well woman exam with routine gynecological exam  ?- Cytology - PAP( [place]) ? ?2) Vaginal discharge ?- Will treat after wet prep results return ? ?3) Irregular menses ?- F/U in 3 months with MD for PCOS w/u ? ?4) Encounter for initial prescription of contraceptive pills  ?- Rx for: norgestimate-ethinyl estradiol (ORTHO-CYCLEN) 0.25-35 MG-MCG tablet ? ?5) Screening examination for sexually transmitted disease  ?- Cervicovaginal ancillary only( [place]),  ?- Hepatitis B surface antigen,  ?- Hepatitis C antibody,  ?- RPR,  ?- HIV Antibody (routine testing w rflx)  ? ?Labs/procedures today: pap and STI testing ? ?Mammogram at age 32 or sooner if problems ?Colonoscopy at age 21 or sooner if problems ? ?Orders Placed This Encounter  ?Procedures  ? Hepatitis B surface antigen  ? Hepatitis C antibody  ? RPR  ? HIV Antibody (routine testing w rflx)  ? ? ?Meds:  ?Meds ordered this encounter  ?Medications  ? norgestimate-ethinyl estradiol (ORTHO-CYCLEN) 0.25-35 MG-MCG tablet  ?  Sig:  Take 1 tablet by mouth daily.  ?  Dispense:  28 tablet  ?  Refill:  12  ?  Order Specific Question:   Supervising Provider  ?  Answer:   Donnamae Jude [[id number]]  ? ? ?Follow-up: Return in about 3 months (around 04/24/2022). ? ?Laury Deep MSN, CNM ?01/23/2022 ?10:43 AM ? ?

## 2022-01-23 NOTE — Progress Notes (Signed)
NGYN pt presents for annual and pap.  ?Requests to discuss menorrhagia and dysmenorrhea.  ?Pt reports "cottage cheese like" vaginal  discharge x 2 weeks.  ?STD testing offered; pt agrees. ?Pt requests to start BCP. Denies unprotected x 14 days  ?PHQ9= 12 - pt refused counseling  ?GAD7= 9  ?

## 2022-01-24 ENCOUNTER — Other Ambulatory Visit: Payer: Self-pay | Admitting: Obstetrics and Gynecology

## 2022-01-24 DIAGNOSIS — B3731 Acute candidiasis of vulva and vagina: Secondary | ICD-10-CM

## 2022-01-24 DIAGNOSIS — B9689 Other specified bacterial agents as the cause of diseases classified elsewhere: Secondary | ICD-10-CM

## 2022-01-24 LAB — CERVICOVAGINAL ANCILLARY ONLY
Bacterial Vaginitis (gardnerella): POSITIVE — AB
Candida Glabrata: NEGATIVE
Candida Vaginitis: POSITIVE — AB
Chlamydia: NEGATIVE
Comment: NEGATIVE
Comment: NEGATIVE
Comment: NEGATIVE
Comment: NEGATIVE
Comment: NEGATIVE
Comment: NORMAL
Neisseria Gonorrhea: NEGATIVE
Trichomonas: NEGATIVE

## 2022-01-24 MED ORDER — FLUCONAZOLE 150 MG PO TABS: 150.0000 mg | ORAL_TABLET | Freq: Once | ORAL | 0 refills | Status: AC

## 2022-01-24 MED ORDER — METRONIDAZOLE 500 MG PO TABS: 500.0000 mg | ORAL_TABLET | Freq: Two times a day (BID) | ORAL | 0 refills | Status: DC

## 2022-01-27 LAB — CYTOLOGY - PAP: Diagnosis: NEGATIVE

## 2022-02-03 ENCOUNTER — Other Ambulatory Visit: Payer: Self-pay

## 2022-02-03 ENCOUNTER — Emergency Department (HOSPITAL_COMMUNITY)
Admission: EM | Admit: 2022-02-03 | Discharge: 2022-02-03 | Disposition: A | Discharge status: Home / Self Care | Payer: Medicaid Other | Attending: Emergency Medicine | Admitting: Emergency Medicine

## 2022-02-03 ENCOUNTER — Encounter (HOSPITAL_COMMUNITY): Payer: Self-pay

## 2022-02-03 DIAGNOSIS — Z5321 Procedure and treatment not carried out due to patient leaving prior to being seen by health care provider: Secondary | ICD-10-CM | POA: Insufficient documentation

## 2022-02-03 DIAGNOSIS — H5789 Other specified disorders of eye and adnexa: Secondary | ICD-10-CM | POA: Insufficient documentation

## 2022-02-03 NOTE — ED Triage Notes (Signed)
Patient said her left eye began itching yesterday out of nowhere. She said it got really red and swollen.  ?

## 2022-02-04 ENCOUNTER — Encounter (HOSPITAL_BASED_OUTPATIENT_CLINIC_OR_DEPARTMENT_OTHER): Payer: Self-pay

## 2022-02-04 ENCOUNTER — Other Ambulatory Visit: Payer: Self-pay

## 2022-02-04 DIAGNOSIS — X58XXXA Exposure to other specified factors, initial encounter: Secondary | ICD-10-CM | POA: Insufficient documentation

## 2022-02-04 DIAGNOSIS — J45909 Unspecified asthma, uncomplicated: Secondary | ICD-10-CM | POA: Insufficient documentation

## 2022-02-04 DIAGNOSIS — S0502XA Injury of conjunctiva and corneal abrasion without foreign body, left eye, initial encounter: Secondary | ICD-10-CM | POA: Insufficient documentation

## 2022-02-04 NOTE — ED Triage Notes (Signed)
Pt presents to the ED with left eye itching and pain that started 4/16. Denies injury or trauma. No redness or welling noted by this RN during triage. ?

## 2022-02-05 ENCOUNTER — Emergency Department (HOSPITAL_BASED_OUTPATIENT_CLINIC_OR_DEPARTMENT_OTHER)
Admission: EM | Admit: 2022-02-05 | Discharge: 2022-02-05 | Disposition: A | Discharge status: Home / Self Care | Payer: Medicaid Other | Attending: Emergency Medicine | Admitting: Emergency Medicine

## 2022-02-05 DIAGNOSIS — S0502XA Injury of conjunctiva and corneal abrasion without foreign body, left eye, initial encounter: Secondary | ICD-10-CM

## 2022-02-05 MED ORDER — FLUORESCEIN SODIUM 1 MG OP STRP
1.0000 | ORAL_STRIP | Freq: Once | OPHTHALMIC | Status: AC
  Administered 2022-02-05: 1 via OPHTHALMIC
  Filled 2022-02-05: qty 1

## 2022-02-05 MED ORDER — ERYTHROMYCIN 5 MG/GM OP OINT: 1.0000 "application " | TOPICAL_OINTMENT | Freq: Four times a day (QID) | OPHTHALMIC | 0 refills | Status: AC

## 2022-02-05 MED ORDER — TETRACAINE HCL 0.5 % OP SOLN
2.0000 [drp] | Freq: Once | OPHTHALMIC | Status: AC
  Administered 2022-02-05: 2 [drp] via OPHTHALMIC
  Filled 2022-02-05: qty 4

## 2022-02-05 NOTE — ED Provider Notes (Signed)
?[facility] ?[facility] Emergency Department ?Provider Note ?MRN:  [id number]  ?Arrival date & time: 02/05/22    ? ?Chief Complaint   ?eye pain ?  ?History of Present Illness   ?Glenda Mendez is a 31 y.o. year-old female with a history of PCOS presenting to the ED with chief complaint of eye pain. ? ?Pain to the left eye for the past 2 days.  No vision loss, no known trauma.  Does not use contact lenses. ? ?Review of Systems  ?A thorough review of systems was obtained and all systems are negative except as noted in the HPI and PMH.  ? ?Patient's Health History   ? ?Past Medical History:  ?Diagnosis Date  ? ADHD (attention deficit hyperactivity disorder)   ? Allergy   ? Asthma   ? BMI (body mass index), pediatric, 95-99% for age 85/10/2010  ? Is starting to gain weight again and advised and counseled today about reduction for health risk reasons. Patient is very well aware of how to do lifestyle intervention she has done this before.   ? Hearing loss in right ear   ? with hearing aid bilateral   ? Hemiparesis ([place])   ? rt from neonatal period  ? Hypoxia of newborn   ? Irregular periods 06/04/2011  ? PCOS (polycystic ovarian syndrome)   ? Pulmonary hemorrhage of fetus or newborn   ? 35 week C-section [facility] of with birth hypoxia  ?  ?Past Surgical History:  ?Procedure Laterality Date  ? CANNULATION FOR ECMO (EXTRACORPOREAL MEMBRANE OXYGENATION)  10/19/92  ? TONSILLECTOMY    ?  ?Family History  ?Adopted: Yes  ?Problem Relation Age of Onset  ? Hypertension Other   ? Schizophrenia Mother   ?     with alcohol drug abuse  ? Diabetes Other   ?  ?Social History  ? ?Socioeconomic History  ? Marital status: Single  ?  Spouse name: Not on file  ? Number of children: Not on file  ? Years of education: Not on file  ? Highest education level: Not on file  ?Occupational History  ? Not on file  ?Tobacco Use  ? Smoking status: Never  ?  Passive exposure: Never  ? Smokeless tobacco: Never  ?Vaping Use  ? Vaping Use: Never  used  ?Substance and Sexual Activity  ? Alcohol use: No  ?  Alcohol/week: 0.0 standard drinks  ? Drug use: No  ? Sexual activity: Yes  ?  Partners: Male  ?  Birth control/protection: None  ?Other Topics Concern  ? Not on file  ?Social History Narrative  ? Adopted and raised by family member Shaylan Tutton  ? [employer] high school  graduate [place] second semester  ? Living apt  [employer] other job   ? Sleep 8+ hours  ? Prev Dr. [name]- pulm at [facility] now has a new pulmonary Dr. Now stable and on prn   ?   ? Thyroid evaluation in the past neck evaluation within normal limits and no thyroid disease.  ? Net tad  ? ?Social Determinants of Health  ? ?Financial Resource Strain: Not on file  ?Food Insecurity: Not on file  ?Transportation Needs: Not on file  ?Physical Activity: Not on file  ?Stress: Not on file  ?Social Connections: Not on file  ?Intimate Partner Violence: Not on file  ?  ? ?Physical Exam  ? ?Vitals:  ? 02/04/22 2339 02/05/22 0215  ?BP: (!) 130/93 115/71  ?Pulse: 80 72  ?  Resp: 15 18  ?Temp: (!) 97 ?F (36.1 ?C)   ?SpO2: 100% 99%  ?  ?CONSTITUTIONAL: Well-appearing, NAD ?NEURO/PSYCH:  Alert and oriented x 3, no focal deficits ?EYES:  eyes equal and reactive, normal extraocular movements, normal visual acuity ?ENT/NECK:  no LAD, no JVD ?CARDIO: Regular rate, well-perfused, normal S1 and S2 ?PULM:  CTAB no wheezing or rhonchi ?GI/GU:  non-distended, non-tender ?MSK/SPINE:  No gross deformities, no edema ?SKIN:  no rash, atraumatic ? ? ?*Additional and/or pertinent findings included in MDM below ? ?Diagnostic and Interventional Summary  ? ? EKG Interpretation ? ?Date/Time:    ?Ventricular Rate:    ?PR Interval:    ?QRS Duration:   ?QT Interval:    ?QTC Calculation:   ?R Axis:     ?Text Interpretation:   ?  ? ?  ? ?Labs Reviewed - No data to display  ?No orders to display  ?  ?Medications  ?tetracaine (PONTOCAINE) 0.5 % ophthalmic solution 2 drop (2 drops Left Eye Given 02/05/22 0215)  ?fluorescein ophthalmic  strip 1 strip (1 strip Left Eye Given 02/05/22 0215)  ?  ? ?Procedures  /  Critical Care ?Procedures ? ?ED Course and Medical Decision Making  ?Initial Impression and Ddx ?Well-appearing eye, no significant erythema, normal extraocular movements, normal pupillary response, eyelids flipped without any signs of foreign body.  With tetracaine and fluorescein application and under Woods lamp evaluation there is an obvious scratch or abrasion at the 12 o'clock position within the conjunctive a, scratch does not involve the cornea.  No signs of ulceration.  No signs of open globe.  Appropriate for discharge on erythromycin, will follow-up with her eye doctor if not improving over the next 3 to 5 days. ? ?Past medical/surgical history that increases complexity of ED encounter: None ? ?Interpretation of Diagnostics ?Not applicable ? ?Patient Reassessment and Ultimate Disposition/Management ?Discharge ? ?Patient management required discussion with the following services or consulting groups:  None ? ?Complexity of Problems Addressed ?Acute complicated illness or Injury ? ?Additional Data Reviewed and Analyzed ?Further history obtained from: ?None ? ?Additional Factors Impacting ED Encounter Risk ?Minor Procedures ? ?Barth Kirks. Sedonia Small, MD ?[facility] ?[email or web address] ? ?Final Clinical Impressions(s) / ED Diagnoses  ? ?  ICD-10-CM   ?1. Abrasion of left conjunctiva, initial encounter  S05.02XA   ?  ?  ?ED Discharge Orders   ? ?      Ordered  ?  erythromycin ophthalmic ointment  4 times daily       ? 02/05/22 0244  ? ?  ?  ? ?  ?  ? ?Discharge Instructions Discussed with and Provided to Patient:  ? ? ?Discharge Instructions   ? ?  ?You were evaluated in the Emergency Department and after careful evaluation, we did not find any emergent condition requiring admission or further testing in the hospital. ? ?Your exam/testing today was overall reassuring.  Your symptoms are due to a  scratch on the surface of your eye.  Take the erythromycin ointment as directed to allow the scratch to heal.  Can use Tylenol or Motrin for discomfort. ? ?Please return to the Emergency Department if you experience any worsening of your condition.  Thank you for allowing [place] to be a part of your care. ? ? ? ? ?  ?Maudie Flakes, MD ?02/05/22 [phone] ? ?

## 2022-02-05 NOTE — Discharge Instructions (Signed)
You were evaluated in the Emergency Department and after careful evaluation, we did not find any emergent condition requiring admission or further testing in the hospital. ? ?Your exam/testing today was overall reassuring.  Your symptoms are due to a scratch on the surface of your eye.  Take the erythromycin ointment as directed to allow the scratch to heal.  Can use Tylenol or Motrin for discomfort. ? ?Please return to the Emergency Department if you experience any worsening of your condition.  Thank you for allowing [place] to be a part of your care. ? ?

## 2022-03-10 ENCOUNTER — Encounter (HOSPITAL_BASED_OUTPATIENT_CLINIC_OR_DEPARTMENT_OTHER): Payer: Self-pay | Admitting: Emergency Medicine

## 2022-03-10 ENCOUNTER — Emergency Department (HOSPITAL_BASED_OUTPATIENT_CLINIC_OR_DEPARTMENT_OTHER)
Admission: EM | Admit: 2022-03-10 | Discharge: 2022-03-10 | Disposition: A | Discharge status: Home / Self Care | Payer: Self-pay | Attending: Emergency Medicine | Admitting: Emergency Medicine

## 2022-03-10 ENCOUNTER — Emergency Department (HOSPITAL_BASED_OUTPATIENT_CLINIC_OR_DEPARTMENT_OTHER): Payer: Self-pay | Admitting: Radiology

## 2022-03-10 ENCOUNTER — Other Ambulatory Visit: Payer: Self-pay

## 2022-03-10 DIAGNOSIS — Z7984 Long term (current) use of oral hypoglycemic drugs: Secondary | ICD-10-CM | POA: Insufficient documentation

## 2022-03-10 DIAGNOSIS — M79601 Pain in right arm: Secondary | ICD-10-CM | POA: Insufficient documentation

## 2022-03-10 MED ORDER — ACETAMINOPHEN 500 MG PO TABS
1000.0000 mg | ORAL_TABLET | Freq: Once | ORAL | Status: AC
  Administered 2022-03-10: 1000 mg via ORAL
  Filled 2022-03-10: qty 2

## 2022-03-10 NOTE — ED Notes (Signed)
Discharge paperwork given and understood. 

## 2022-03-10 NOTE — Discharge Instructions (Signed)
Please follow up with your PCP for further evaluation of your arm/shoulder pain. Use the arm sling as needed for comfort.   You can alternate Ibuprofen and Tylenol as needed for pain.   Return to the ED for any new/worsening symptoms

## 2022-03-10 NOTE — ED Provider Notes (Signed)
[facility] EMERGENCY DEPT Provider Note   CSN: [id number] Arrival date & time: 03/10/22  1554     History  Chief Complaint  Patient presents with   Arm Pain    Glenda Mendez is a 31 y.o. female who presents to the ED today with complaint of gradual onset, constant, achy, R arm pain that began yesterday. Pt states she was playing with children yesterday. She is unsure if she injured her arm however denies any falls. She has not taken anything specifically for pain. She does complain of a tingling sensation to her R 5th finger. No other complaints at this time. No swelling to arm.  The history is provided by the patient and medical records.      Home Medications Prior to Admission medications   Medication Sig Start Date End Date Taking? Authorizing Provider  albuterol (PROVENTIL) (2.5 MG/3ML) 0.083% nebulizer solution Take 3 mLs (2.5 mg total) by nebulization every 4 (four) hours as needed for wheezing or shortness of breath. Patient not taking: Reported on 01/23/2022 06/22/19   Okey Regal, PA-C  albuterol (VENTOLIN HFA) 108 (90 Base) MCG/ACT inhaler Inhale 2 puffs into the lungs every 6 (six) hours as needed for wheezing or shortness of breath. Patient not taking: Reported on 01/23/2022 06/22/19   Hedges, Dellis Filbert, PA-C  dicyclomine (BENTYL) 20 MG tablet Take 1 tablet (20 mg total) by mouth 2 (two) times daily. 12/27/21   Evlyn Courier, PA-C  EPINEPHrine 0.3 mg/0.3 mL IJ SOAJ injection Inject 0.3 mLs (0.3 mg total) into the muscle as needed for anaphylaxis. Patient not taking: Reported on 01/23/2022 04/25/20   Panosh, Standley Brooking, MD  fluticasone [facility]) 50 MCG/ACT nasal spray Place 2 sprays into both nostrils daily. Patient not taking: Reported on 01/23/2022 10/16/21   Blanchie Dessert, MD  fluticasone-salmeterol (ADVAIR HFA) 230-21 MCG/ACT inhaler Inhale 2 puffs into the lungs 2 (two) times daily. Patient not taking: Reported on 01/23/2022 08/31/18   Brand Males, MD   hydrOXYzine (ATARAX/VISTARIL) 25 MG tablet Take 1-2 tablets (25-50 mg total) by mouth every 6 (six) hours as needed for itching. Patient not taking: Reported on 01/23/2022 04/25/20   Panosh, Standley Brooking, MD  ipratropium (ATROVENT) 0.02 % nebulizer solution USE 1 VIAL BY NEBULIZATION 3 (THREE) TIMES DAILY AS NEEDED FOR WHEEZING. **DUE FOR ANNUAL VISIT June 2019** Patient not taking: Reported on 01/23/2022 06/22/19   Okey Regal, PA-C  levocetirizine (XYZAL ALLERGY 24HR) 5 MG tablet Take 1 tablet (5 mg total) by mouth every evening. Patient not taking: Reported on 01/23/2022 02/18/17   Dorothyann Peng, NP  loratadine (CLARITIN) 10 MG tablet Take 1 tablet (10 mg total) by mouth daily. 10/16/21   Blanchie Dessert, MD  metFORMIN (GLUCOPHAGE-XR) 500 MG 24 hr tablet Take 1 tablet (500 mg total) by mouth daily with breakfast. 10/19/18 11/27/28  Panosh, Standley Brooking, MD  methylphenidate 36 MG PO CR tablet TAKE (2) TABLETS BY MOUTH DAILY. Patient taking differently: Take 72 mg by mouth daily. 01/19/19   Panosh, Standley Brooking, MD  metroNIDAZOLE (FLAGYL) 500 MG tablet Take 1 tablet (500 mg total) by mouth 2 (two) times daily. 01/24/22   Laury Deep, CNM  norgestimate-ethinyl estradiol (ORTHO-CYCLEN) 0.25-35 MG-MCG tablet Take 1 tablet by mouth daily. 01/23/22   Laury Deep, CNM  ondansetron (ZOFRAN-ODT) 4 MG disintegrating tablet Take 1 tablet (4 mg total) by mouth every 8 (eight) hours as needed for nausea or vomiting. Patient not taking: Reported on 01/23/2022 12/27/21   Evlyn Courier, PA-C  pantoprazole (  PROTONIX) 40 MG tablet Take 1 tablet (40 mg total) by mouth 2 (two) times daily. Patient not taking: Reported on 01/23/2022 12/27/21   Evlyn Courier, PA-C  sucralfate (CARAFATE) 1 g tablet Take 1 tablet (1 g total) by mouth 4 (four) times daily -  with meals and at bedtime for 10 days. 12/05/20 12/15/20  Curatolo, Adam, DO  VITAMIN D PO Take by mouth. Takes the gummies    [provider]  famotidine (PEPCID) 20 MG tablet Take 1  tablet (20 mg total) by mouth 2 (two) times daily. Take twice daily for 3 days, then as needed if symptoms recur Patient not taking: Reported on 11/28/2019 10/27/19 11/28/19  Charlann Lange, PA-C      Allergies    Patient has no known allergies.    Review of Systems   Review of Systems  Constitutional:  Negative for chills and fever.  Musculoskeletal:  Positive for arthralgias.  Neurological:  Negative for weakness and numbness.  All other systems reviewed and are negative.  Physical Exam Updated Vital Signs BP (!) 130/101 (BP Location: Left Arm)   Pulse 91   Temp 98 F (36.7 C)   Resp 16   Ht '5\' 7"'$  (1.702 m)   Wt 127 kg   SpO2 100%   BMI 43.85 kg/m  Physical Exam Vitals and nursing note reviewed.  Constitutional:      Appearance: She is not ill-appearing or diaphoretic.  HENT:     Head: Normocephalic and atraumatic.  Eyes:     Conjunctiva/sclera: Conjunctivae normal.  Cardiovascular:     Rate and Rhythm: Normal rate and regular rhythm.  Pulmonary:     Effort: Pulmonary effort is normal.     Breath sounds: Normal breath sounds.  Musculoskeletal:        General: Tenderness present.     Comments: No obvious deformity appreciated to RUE. Diffusely TTP throughout RUE. ROM limited mostly to shoulder s/2 pain. Sensation intact throughout. 2+ radial pulse. Cap refill < 2 seconds to all digits.   Skin:    General: Skin is warm and dry.     Coloration: Skin is not jaundiced.  Neurological:     Mental Status: She is alert.    ED Results / Procedures / Treatments   Labs (all labs ordered are listed, but only abnormal results are displayed) Labs Reviewed - No data to display  EKG None  Radiology DG Shoulder Right  Result Date: 03/10/2022 CLINICAL DATA:  Pain EXAM: RIGHT SHOULDER - 2+ VIEW COMPARISON:  None Available. FINDINGS: There is no evidence of fracture or dislocation. There is no evidence of arthropathy or other focal bone abnormality. Soft tissues are unremarkable.  IMPRESSION: No radiographic abnormality is seen in the right shoulder. Electronically Signed   By: Elmer Picker M.D.   On: 03/10/2022 18:11   DG Elbow Complete Right  Result Date: 03/10/2022 CLINICAL DATA:  Pain EXAM: RIGHT ELBOW - COMPLETE 3+ VIEW COMPARISON:  None Available. FINDINGS: No recent fracture or dislocation is seen. There is no displacement of posterior fat pad. Bony spur is seen in the tip of coronoid process. IMPRESSION: No recent fracture or dislocation is seen in the right elbow. Electronically Signed   By: Elmer Picker M.D.   On: 03/10/2022 18:12   DG Wrist Complete Right  Result Date: 03/10/2022 CLINICAL DATA:  Pain EXAM: RIGHT WRIST - COMPLETE 3+ VIEW COMPARISON:  None Available. FINDINGS: No fracture or dislocation is seen. There is fusion of triquetrum and  lunate. IMPRESSION: No fracture or dislocation is seen in the left wrist. Electronically Signed   By: Elmer Picker M.D.   On: 03/10/2022 18:11    Procedures Procedures    Medications Ordered in ED Medications  acetaminophen (TYLENOL) tablet 1,000 mg (1,000 mg Oral Given 03/10/22 1702)    ED Course/ Medical Decision Making/ A&P                           Medical Decision Making 31 year old female who presents to the ED today with complaint of atraumatic RUE pain that began yesterday after playing with children. Pt unsure regarding injury. On arrival to the ED VSS. Pt appears to be in NAD. She is exquisitely tender throughout her RUE with limited ROM s/2 same. Neurovascularly intact throughout. Will plan for xrays for further eval. No swelling to suggest DVT at this time. Tylenol provided for pain.   Xrays negative at this time. PT reports mild improvement with Tylenol however when she goes to move her arm it Is painful mostly at the R shoulder. Will provide shoulder sling at this time. Pt instructed on PRN Ibuprofen/Tylenol for pain and PCP follow up. She is in agreement with plan and stable for  discharge home.   Amount and/or Complexity of Data Reviewed Radiology: ordered and independent interpretation performed.    Details: Xrays independently interprested; no acute findings. Confirmed by radiologist.  Risk OTC drugs.          Final Clinical Impression(s) / ED Diagnoses Final diagnoses:  Pain of right upper extremity    Rx / DC Orders ED Discharge Orders     None        Discharge Instructions      Please follow up with your PCP for further evaluation of your arm/shoulder pain. Use the arm sling as needed for comfort.   You can alternate Ibuprofen and Tylenol as needed for pain.   Return to the ED for any new/worsening symptoms       Eustaquio Maize, Hershal Coria 03/10/22 1823    Wyvonnia Dusky, MD 03/11/22 1032

## 2022-03-10 NOTE — ED Triage Notes (Signed)
Pt arrives to ED with c/o right arm pain. This started yesterday. Pt denies known injury. States slight cramping in right hand.

## 2022-04-01 NOTE — Progress Notes (Signed)
Chief Complaint  Patient presents with   Follow-up    HPI: Glenda Mendez 31 y.o. come in for   f/u   Last visit 2 23 for ear pain  lasat pv  21  Last WWex 4 23  [place]   on ocps  not as much bleeding  still somewhat irregular  .  Rx for BV  Feeling ok  sleep better  Taking metformin once a day  no se reported .  Bp :has been ok  beginning to improve diet  and looking forward to new job position. Derm per [facility] eczema  stable  Sleep 8 hours    was at [employer] and now [employer].  Building surveyor.  Phone  June 20th   10 hours .  ROS: See pertinent positives and negatives per HPI. No cp sob  Past Medical History:  Diagnosis Date   ADHD (attention deficit hyperactivity disorder)    Allergy    Asthma    BMI (body mass index), pediatric, 95-99% for age 02/18/2011   Is starting to gain weight again and advised and counseled today about reduction for health risk reasons. Patient is very well aware of how to do lifestyle intervention she has done this before.    Hearing loss in right ear    with hearing aid bilateral    Hemiparesis (HCC)    rt from neonatal period   Hypoxia of newborn    Irregular periods 06/04/2011   PCOS (polycystic ovarian syndrome)    Pulmonary hemorrhage of fetus or newborn    56 week C-section [facility] of with birth hypoxia    Family History  Adopted: Yes  Problem Relation Age of Onset   Hypertension Other    Schizophrenia Mother        with alcohol drug abuse   Diabetes Other     Social History   Socioeconomic History   Marital status: Single    Spouse name: Not on file   Number of children: Not on file   Years of education: Not on file   Highest education level: Not on file  Occupational History   Not on file  Tobacco Use   Smoking status: Never    Passive exposure: Never   Smokeless tobacco: Never  Vaping Use   Vaping Use: Never used  Substance and Sexual Activity   Alcohol use: No    Alcohol/week: 0.0 standard drinks of alcohol    Drug use: No   Sexual activity: Yes    Partners: Male    Birth control/protection: None  Other Topics Concern   Not on file  Social History Narrative   Adopted and raised by family member [name]   [employer] high school  graduate [place] second semester   Living apt  [employer] other job    Sleep 8+ hours   Prev Dr. [name]- pulm at [facility] now has a new pulmonary Dr. Now stable and on prn       Thyroid evaluation in the past neck evaluation within normal limits and no thyroid disease.   Net tad   Social Determinants of Health   Financial Resource Strain: Not on file  Food Insecurity: Not on file  Transportation Needs: Not on file  Physical Activity: Not on file  Stress: Not on file  Social Connections: Not on file    Outpatient Medications Prior to Visit  Medication Sig Dispense Refill   albuterol (PROVENTIL) (2.5 MG/3ML) 0.083% nebulizer solution Take  3 mLs (2.5 mg total) by nebulization every 4 (four) hours as needed for wheezing or shortness of breath. 75 mL 2   albuterol (VENTOLIN HFA) 108 (90 Base) MCG/ACT inhaler Inhale 2 puffs into the lungs every 6 (six) hours as needed for wheezing or shortness of breath. 18 g 3   dicyclomine (BENTYL) 20 MG tablet Take 1 tablet (20 mg total) by mouth 2 (two) times daily. 20 tablet 0   EPINEPHrine 0.3 mg/0.3 mL IJ SOAJ injection Inject 0.3 mLs (0.3 mg total) into the muscle as needed for anaphylaxis. 1 each 0   fluticasone (FLONASE) 50 MCG/ACT nasal spray Place 2 sprays into both nostrils daily. 16 g 0   fluticasone-salmeterol (ADVAIR HFA) 230-21 MCG/ACT inhaler Inhale 2 puffs into the lungs 2 (two) times daily. 1 Inhaler 5   hydrOXYzine (ATARAX/VISTARIL) 25 MG tablet Take 1-2 tablets (25-50 mg total) by mouth every 6 (six) hours as needed for itching. 30 tablet 1   ipratropium (ATROVENT) 0.02 % nebulizer solution USE 1 VIAL BY NEBULIZATION 3 (THREE) TIMES DAILY AS NEEDED FOR WHEEZING. **DUE FOR ANNUAL VISIT June 2019** 62.5 mL 2    levocetirizine (XYZAL ALLERGY 24HR) 5 MG tablet Take 1 tablet (5 mg total) by mouth every evening. 90 tablet 3   loratadine (CLARITIN) 10 MG tablet Take 1 tablet (10 mg total) by mouth daily. 10 tablet 0   metFORMIN (GLUCOPHAGE-XR) 500 MG 24 hr tablet Take 1 tablet (500 mg total) by mouth daily with breakfast. 90 tablet 0   methylphenidate 36 MG PO CR tablet TAKE (2) TABLETS BY MOUTH DAILY. (Patient taking differently: Take 72 mg by mouth daily.) 60 tablet 0   norgestimate-ethinyl estradiol (ORTHO-CYCLEN) 0.25-35 MG-MCG tablet Take 1 tablet by mouth daily. 28 tablet 12   ondansetron (ZOFRAN-ODT) 4 MG disintegrating tablet Take 1 tablet (4 mg total) by mouth every 8 (eight) hours as needed for nausea or vomiting. 20 tablet 0   pantoprazole (PROTONIX) 40 MG tablet Take 1 tablet (40 mg total) by mouth 2 (two) times daily. 60 tablet 0   VITAMIN D PO Take by mouth. Takes the gummies     metroNIDAZOLE (FLAGYL) 500 MG tablet Take 1 tablet (500 mg total) by mouth 2 (two) times daily. 14 tablet 0   sucralfate (CARAFATE) 1 g tablet Take 1 tablet (1 g total) by mouth 4 (four) times daily -  with meals and at bedtime for 10 days. 40 tablet 0   No facility-administered medications prior to visit.     EXAM:  BP 124/80 (BP Location: Left Arm, Patient Position: Sitting, Cuff Size: Normal)   Pulse 90   Temp 98.9 F (37.2 C) (Oral)   Ht '5\' 7"'$  (1.702 m)   Wt 286 lb 12.8 oz (130.1 kg)   LMP 03/18/2022   SpO2 98%   BMI 44.92 kg/m   Body mass index is 44.92 kg/m. Wt Readings from Last 3 Encounters:  04/02/22 286 lb 12.8 oz (130.1 kg)  03/10/22 280 lb (127 kg)  02/04/22 280 lb (127 kg)    GENERAL: vitals reviewed and listed above, alert, oriented, appears well hydrated and in no acute distress HEENT: atraumatic, conjunctiva  clear, no obvious abnormalities on inspection of external nose and ears  NECK: no obvious masses on inspection palpation  LUNGS: clear to auscultation bilaterally, no wheezes,  rales or rhonchi, good air movement CV: HRRR, no clubbing cyanosis or  peripheral edema nl cap refill  Abdomen:  Sof,t normal bowel sounds without hepatosplenomegaly,  no guarding rebound or masses no CVA tenderness MS: moves all extremities without noticeable focal  abnormality Skin no acute findings  no petechia  bruising PSYCH: pleasant and cooperative, no obvious depression or anxiety Lab Results  Component Value Date   WBC 5.2 04/02/2022   HGB 13.1 04/02/2022   HCT 42.0 04/02/2022   PLT 434.0 (H) 04/02/2022   GLUCOSE 92 04/02/2022   CHOL 210 (H) 04/02/2022   TRIG 49.0 04/02/2022   HDL 72.20 04/02/2022   LDLCALC 128 (H) 04/02/2022   ALT 21 04/02/2022   AST 19 04/02/2022   NA 137 04/02/2022   K 3.7 04/02/2022   CL 103 04/02/2022   CREATININE 0.86 04/02/2022   BUN 9 04/02/2022   CO2 24 04/02/2022   TSH 2.46 04/02/2022   HGBA1C 6.4 04/02/2022   BP Readings from Last 3 Encounters:  04/02/22 124/80  03/10/22 113/66  02/05/22 115/71    ASSESSMENT AND PLAN:  Discussed the following assessment and plan:  Medication management - Plan: Basic Metabolic Panel, CBC with Differential/Platelets, Hemoglobin A1c, Hepatic Function Panel, Lipid Panel, TSH, T4, Free, T4, Free, TSH, Lipid Panel, Hepatic Function Panel, Hemoglobin A1c, CBC with Differential/Platelets, Basic Metabolic Panel, CANCELED: Basic metabolic panel, CANCELED: CBC with Differential/Platelet, CANCELED: Hemoglobin A1c, CANCELED: Hepatic function panel, CANCELED: Lipid panel, CANCELED: TSH, CANCELED: T4, free  Elevated hemoglobin A1c - Plan: Basic Metabolic Panel, CBC with Differential/Platelets, Hemoglobin A1c, Hepatic Function Panel, Lipid Panel, TSH, T4, Free, T4, Free, TSH, Lipid Panel, Hepatic Function Panel, Hemoglobin A1c, CBC with Differential/Platelets, Basic Metabolic Panel, CANCELED: Basic metabolic panel, CANCELED: CBC with Differential/Platelet, CANCELED: Hemoglobin A1c, CANCELED: Hepatic function panel,  CANCELED: Lipid panel, CANCELED: TSH, CANCELED: T4, free  Insulin resistance - Plan: Basic Metabolic Panel, CBC with Differential/Platelets, Hemoglobin A1c, Hepatic Function Panel, Lipid Panel, TSH, T4, Free, T4, Free, TSH, Lipid Panel, Hepatic Function Panel, Hemoglobin A1c, CBC with Differential/Platelets, Basic Metabolic Panel, CANCELED: Basic metabolic panel, CANCELED: CBC with Differential/Platelet, CANCELED: Hemoglobin A1c, CANCELED: Hepatic function panel, CANCELED: Lipid panel, CANCELED: TSH, CANCELED: T4, free  Severe obesity (BMI >= 40) (HCC) - Plan: Basic Metabolic Panel, CBC with Differential/Platelets, Hemoglobin A1c, Hepatic Function Panel, Lipid Panel, TSH, T4, Free, T4, Free, TSH, Lipid Panel, Hepatic Function Panel, Hemoglobin A1c, CBC with Differential/Platelets, Basic Metabolic Panel, CANCELED: Basic metabolic panel, CANCELED: CBC with Differential/Platelet, CANCELED: Hemoglobin A1c, CANCELED: Hepatic function panel, CANCELED: Lipid panel, CANCELED: TSH, CANCELED: T4, free  PCOS (polycystic ovarian syndrome) Lab update today 'glad she is feeling well. Advise continue weight loss efforts   consider other interventions  . Plan fu depending on results   -Patient advised to return or notify health care team  if  new concerns arise.  Patient Instructions  Good to see you today .  Continue lifestyle intervention healthy eating and activity  Getting weight down will be helpful Avoid sugar beverages .  Portion size  Let us know if we  need to help with this. ( Weight management  plus meds etc sometimes are helpful)    Mariann Laster K. Callaway Hailes M.D.

## 2022-04-02 ENCOUNTER — Ambulatory Visit (INDEPENDENT_AMBULATORY_CARE_PROVIDER_SITE_OTHER): Payer: Medicaid Other | Admitting: Internal Medicine

## 2022-04-02 ENCOUNTER — Encounter: Payer: Self-pay | Admitting: Internal Medicine

## 2022-04-02 ENCOUNTER — Other Ambulatory Visit: Payer: Self-pay

## 2022-04-02 VITALS — BP 124/80 | HR 90 | Temp 98.9°F | Ht 67.0 in | Wt 286.8 lb

## 2022-04-02 DIAGNOSIS — R7309 Other abnormal glucose: Secondary | ICD-10-CM

## 2022-04-02 DIAGNOSIS — E8881 Metabolic syndrome: Secondary | ICD-10-CM

## 2022-04-02 DIAGNOSIS — Z79899 Other long term (current) drug therapy: Secondary | ICD-10-CM

## 2022-04-02 DIAGNOSIS — E88819 Insulin resistance, unspecified: Secondary | ICD-10-CM

## 2022-04-02 DIAGNOSIS — E282 Polycystic ovarian syndrome: Secondary | ICD-10-CM

## 2022-04-02 LAB — CBC WITH DIFFERENTIAL/PLATELET
Basophils Absolute: 0 10*3/uL (ref 0.0–0.1)
Basophils Relative: 0.5 % (ref 0.0–3.0)
Eosinophils Absolute: 0.1 10*3/uL (ref 0.0–0.7)
Eosinophils Relative: 2.7 % (ref 0.0–5.0)
HCT: 42 % (ref 36.0–46.0)
Hemoglobin: 13.1 g/dL (ref 12.0–15.0)
Lymphocytes Relative: 35.9 % (ref 12.0–46.0)
Lymphs Abs: 1.8 10*3/uL (ref 0.7–4.0)
MCHC: 31.1 g/dL (ref 30.0–36.0)
MCV: 68.1 fl — ABNORMAL LOW (ref 78.0–100.0)
Monocytes Absolute: 0.3 10*3/uL (ref 0.1–1.0)
Monocytes Relative: 6.3 % (ref 3.0–12.0)
Neutro Abs: 2.8 10*3/uL (ref 1.4–7.7)
Neutrophils Relative %: 54.6 % (ref 43.0–77.0)
Platelets: 434 10*3/uL — ABNORMAL HIGH (ref 150.0–400.0)
RBC: 6.17 Mil/uL — ABNORMAL HIGH (ref 3.87–5.11)
RDW: 17.5 % — ABNORMAL HIGH (ref 11.5–15.5)
WBC: 5.2 10*3/uL (ref 4.0–10.5)

## 2022-04-02 LAB — LIPID PANEL
Cholesterol: 210 mg/dL — ABNORMAL HIGH (ref 0–200)
HDL: 72.2 mg/dL (ref >39.00)
LDL Cholesterol: 128 mg/dL — ABNORMAL HIGH (ref 0–99)
NonHDL: 138.09
Total CHOL/HDL Ratio: 3
Triglycerides: 49 mg/dL (ref 0.0–149.0)
VLDL: 9.8 mg/dL (ref 0.0–40.0)

## 2022-04-02 LAB — BASIC METABOLIC PANEL
BUN: 9 mg/dL (ref 6–23)
CO2: 24 mEq/L (ref 19–32)
Calcium: 9.7 mg/dL (ref 8.4–10.5)
Chloride: 103 mEq/L (ref 96–112)
Creatinine, Ser: 0.86 mg/dL (ref 0.40–1.20)
GFR: 91.25 mL/min (ref >60.00)
Glucose, Bld: 92 mg/dL (ref 70–99)
Potassium: 3.7 mEq/L (ref 3.5–5.1)
Sodium: 137 mEq/L (ref 135–145)

## 2022-04-02 LAB — HEMOGLOBIN A1C: Hgb A1c MFr Bld: 6.4 % (ref 4.6–6.5)

## 2022-04-02 LAB — HEPATIC FUNCTION PANEL
ALT: 21 U/L (ref 0–35)
AST: 19 U/L (ref 0–37)
Albumin: 4.5 g/dL (ref 3.5–5.2)
Alkaline Phosphatase: 133 U/L — ABNORMAL HIGH (ref 39–117)
Bilirubin, Direct: 0.1 mg/dL (ref 0.0–0.3)
Total Bilirubin: 0.4 mg/dL (ref 0.2–1.2)
Total Protein: 8.8 g/dL — ABNORMAL HIGH (ref 6.0–8.3)

## 2022-04-02 LAB — TSH: TSH: 2.46 u[IU]/mL (ref 0.35–5.50)

## 2022-04-02 LAB — T4, FREE: Free T4: 0.75 ng/dL (ref 0.60–1.60)

## 2022-04-02 NOTE — Patient Instructions (Signed)
Good to see you today .  Continue lifestyle intervention healthy eating and activity  Getting weight down will be helpful Avoid sugar beverages .  Portion size  Let us know if we  need to help with this. ( Weight management  plus meds etc sometimes are helpful)

## 2022-04-05 ENCOUNTER — Encounter: Payer: Self-pay | Admitting: Internal Medicine

## 2022-04-16 ENCOUNTER — Other Ambulatory Visit: Payer: Self-pay

## 2022-04-16 DIAGNOSIS — R7309 Other abnormal glucose: Secondary | ICD-10-CM

## 2022-04-16 DIAGNOSIS — E559 Vitamin D deficiency, unspecified: Secondary | ICD-10-CM

## 2022-05-06 ENCOUNTER — Telehealth: Payer: Self-pay | Admitting: Internal Medicine

## 2022-05-06 ENCOUNTER — Encounter: Payer: Self-pay | Admitting: Family Medicine

## 2022-05-06 ENCOUNTER — Ambulatory Visit (INDEPENDENT_AMBULATORY_CARE_PROVIDER_SITE_OTHER): Payer: Medicaid Other | Admitting: Family Medicine

## 2022-05-06 VITALS — BP 118/78 | HR 92 | Temp 98.8°F | Wt 286.0 lb

## 2022-05-06 DIAGNOSIS — J020 Streptococcal pharyngitis: Secondary | ICD-10-CM

## 2022-05-06 DIAGNOSIS — J029 Acute pharyngitis, unspecified: Secondary | ICD-10-CM

## 2022-05-06 LAB — POCT RAPID STREP A (OFFICE): Rapid Strep A Screen: POSITIVE — AB

## 2022-05-06 LAB — POC COVID19 BINAXNOW: SARS Coronavirus 2 Ag: NEGATIVE

## 2022-05-06 MED ORDER — AMOXICILLIN-POT CLAVULANATE 875-125 MG PO TABS: 1.0000 | ORAL_TABLET | Freq: Two times a day (BID) | ORAL | 0 refills | Status: DC

## 2022-05-06 NOTE — Progress Notes (Signed)
   Subjective:    Patient ID: Glenda Mendez, female    DOB: 03/09/1991, 31 y.o.   MRN: [id number]  HPI Here for 2 days of a bad ST, headache, a dry cough, and vomiting once. No fever. Drinking fluids and taking Ibuprofen.    Review of Systems  Constitutional: Negative.   HENT:  Positive for sore throat. Negative for congestion and postnasal drip.   Eyes: Negative.   Respiratory:  Positive for cough. Negative for shortness of breath and wheezing.        Objective:   Physical Exam Constitutional:      General: She is not in acute distress.    Appearance: Normal appearance.  HENT:     Right Ear: Tympanic membrane, ear canal and external ear normal.     Left Ear: Tympanic membrane, ear canal and external ear normal.     Nose: Nose normal.     Mouth/Throat:     Pharynx: Oropharynx is clear. No oropharyngeal exudate or posterior oropharyngeal erythema.  Eyes:     Conjunctiva/sclera: Conjunctivae normal.  Pulmonary:     Effort: Pulmonary effort is normal.     Breath sounds: Normal breath sounds.  Lymphadenopathy:     Cervical: No cervical adenopathy.  Neurological:     Mental Status: She is alert.           Assessment & Plan:  Strep infection. Treat with 10 days of Augmentin.  Alysia Penna, MD

## 2022-05-06 NOTE — Telephone Encounter (Signed)
Patient calling in with respiratory symptoms: Shortness of breath, chest pain, palpitations or other red words send to Triage  Does the patient have a fever over 100, cough, congestion, sore throat, runny nose, lost of taste/smell (please list symptoms that patient has)? Runny nose, sore throat and sob  What date did symptoms start?05-04-2022 (If over 5 days ago, pt may be scheduled for in person visit)  Have you tested for Covid in the last 5 days? No   If yes, was it positive '[]'$  OR negative '[]'$ ? If positive in the last 5 days, please schedule virtual visit now. If negative, schedule for an in person OV with the next available provider if PCP has no openings. Please also let patient know they will be tested again (follow the script below)  "you will have to arrive 11mns prior to your appt time to be Covid tested. Please park in back of office at the [facility] & call [phone] let the staff know you have arrived. A staff member will meet you at your car to do a rapid covid test. Once the test has resulted you will be notified by phone of your results to determine if appt will remain an in person visit or be converted to a virtual/phone visit. If you arrive less than 385ms before your appt time, your visit will be automatically converted to virtual & any recommended testing will happen AFTER the visit." Pt has been sch with dr fry today  THINGS TO REMEMBER  If no availability for virtual visit in office,  please [facility]  If no [facility] at another [facility], please instruct patient that they can schedule an evisit or virtual visit through their mychart account. Visits up to 8pm  patients can be seen in office 5 days after positive COVID test

## 2022-06-04 ENCOUNTER — Encounter: Payer: Self-pay | Admitting: Internal Medicine

## 2022-06-04 ENCOUNTER — Ambulatory Visit (INDEPENDENT_AMBULATORY_CARE_PROVIDER_SITE_OTHER): Payer: Self-pay | Admitting: Internal Medicine

## 2022-06-04 VITALS — BP 104/70 | HR 97 | Temp 98.1°F | Resp 14 | Ht 67.0 in | Wt 287.6 lb

## 2022-06-04 DIAGNOSIS — T7840XD Allergy, unspecified, subsequent encounter: Secondary | ICD-10-CM

## 2022-06-04 DIAGNOSIS — B9789 Other viral agents as the cause of diseases classified elsewhere: Secondary | ICD-10-CM

## 2022-06-04 DIAGNOSIS — J028 Acute pharyngitis due to other specified organisms: Secondary | ICD-10-CM

## 2022-06-04 DIAGNOSIS — H66001 Acute suppurative otitis media without spontaneous rupture of ear drum, right ear: Secondary | ICD-10-CM

## 2022-06-04 DIAGNOSIS — J020 Streptococcal pharyngitis: Secondary | ICD-10-CM

## 2022-06-04 LAB — POCT RAPID STREP A (OFFICE): Rapid Strep A Screen: POSITIVE — AB

## 2022-06-04 MED ORDER — MAGIC MOUTHWASH W/LIDOCAINE: 5.0000 mL | Freq: Three times a day (TID) | ORAL | Status: DC | PRN

## 2022-06-04 MED ORDER — MAGIC MOUTHWASH W/LIDOCAINE: 5.0000 mL | Freq: Three times a day (TID) | ORAL | Status: AC | PRN

## 2022-06-04 MED ORDER — AMOXICILLIN 875 MG PO TABS: 875.0000 mg | ORAL_TABLET | Freq: Two times a day (BID) | ORAL | 0 refills | Status: DC

## 2022-06-04 NOTE — Patient Instructions (Signed)
Sore Throat A sore throat is pain, burning, irritation, or scratchiness in the throat. When you have a sore throat, you may feel pain or tenderness in your throat when you swallow or talk. Many things can cause a sore throat, including: An infection. Seasonal allergies. Dryness in the air. Irritants, such as smoke or pollution. Radiation treatment for cancer. Gastroesophageal reflux disease (GERD). A tumor. A sore throat is often the first sign of another sickness. It may happen with other symptoms, such as coughing, sneezing, fever, and swollen neck glands. Most sore throats go away without medical treatment. Follow these instructions at home:     Medicines Take over-the-counter and prescription medicines only as told by your health care provider. Children often get sore throats. Do not give your child aspirin because of the association with Reye's syndrome. Use throat sprays to soothe your throat as told by your health care provider. Managing pain To help with pain, try: Sipping warm liquids, such as broth, herbal tea, or warm water. Eating or drinking cold or frozen liquids, such as frozen ice pops. Gargling with a mixture of salt and water 3-4 times a day or as needed. To make salt water, completely dissolve -1 tsp (3-6 g) of salt in 1 cup (237 mL) of warm water. Sucking on hard candy or throat lozenges. Putting a cool-mist humidifier in your bedroom at night to moisten the air. Sitting in the bathroom with the door closed for 5-10 minutes while you run hot water in the shower. General instructions Do not use any products that contain nicotine or tobacco. These products include cigarettes, chewing tobacco, and vaping devices, such as e-cigarettes. If you need help quitting, ask your health care provider. Rest as needed. Drink enough fluid to keep your urine pale yellow. Wash your hands often with soap and water for at least 20 seconds. If soap and water are not available, use hand  sanitizer. Contact a health care provider if: You have a fever for more than 2-3 days. You have symptoms that last for more than 2-3 days. Your throat does not get better within 7 days. You have a fever and your symptoms suddenly get worse. Get help right away if: You have difficulty breathing. You cannot swallow fluids, soft foods, or your saliva. You have increased swelling in your throat or neck. You have persistent nausea and vomiting. These symptoms may represent a serious problem that is an emergency. Do not wait to see if the symptoms will go away. Get medical help right away. Call your local emergency services (911 in the [place] Do not drive yourself to the hospital. Summary A sore throat is pain, burning, irritation, or scratchiness in the throat. Many things can cause a sore throat. Take over-the-counter medicines only as told by your health care provider. Rest as needed. Drink enough fluid to keep your urine pale yellow. Contact a health care provider if your throat does not get better within 7 days. This information is not intended to replace advice given to you by your health care provider. Make sure you discuss any questions you have with your health care provider. Document Revised: 01/02/2021 Document Reviewed: 01/02/2021 Elsevier Patient Education  [place].

## 2022-06-04 NOTE — Progress Notes (Signed)
Acute Office Visit  Subjective:     Patient ID: Glenda Mendez, female    DOB: 1991-02-10, 31 y.o.   MRN: [id number]  Chief Complaint  Patient presents with   Sore Throat    With headache that started this morning at around 6 am. Says it's very cold at her job and dealing with customers on the phone may be causing the headache.    Sore Throat    Patient is in today for headache and sore throat.  When she woke up this am she had the headache and head felt dingy.   Felt like things were moving and she sat herself down and that helped.  Everything was going in 360 reallly fast and when she tried to walk it seemed like floor was shifting.  Manager thought she was drunk- she wasn't.   Then she noticed throat started to hurt really bad, cant even eat, and throat is itching too.    She does have h/o strep throat.   She denies fevers.  She doesn't have tonsils, removed 12th grade.  The headache is very extreme, light sensitive (makes it worse).  Has had lots of headache in past and this just feels like a worse version.     Objective:    BP 104/70 (BP Location: Left Arm)   Pulse 97   Temp 98.1 F (36.7 C) (Temporal)   Resp 14   Ht '5\' 7"'$  (1.702 m)   Wt 287 lb 9.6 oz (130.5 kg)   SpO2 98%   BMI 45.04 kg/m  Wt Readings from Last 3 Encounters:  06/04/22 287 lb 9.6 oz (130.5 kg)  05/06/22 286 lb (129.7 kg)  04/02/22 286 lb 12.8 oz (130.1 kg)      Physical Exam Constitutional:      General: She is not in acute distress.    Appearance: She is well-developed. She is obese. She is ill-appearing. She is not diaphoretic.  HENT:     Head: Normocephalic and atraumatic.     Right Ear: Ear canal normal. Swelling and tenderness present.     Left Ear: Tympanic membrane and ear canal normal.     Mouth/Throat:     Mouth: No oral lesions.     Pharynx: No pharyngeal swelling, oropharyngeal exudate, posterior oropharyngeal erythema or uvula swelling.     Tonsils: No tonsillar exudate.  Eyes:      Conjunctiva/sclera: Conjunctivae normal.     Pupils: Pupils are equal, round, and reactive to light.  Neurological:     Mental Status: She is alert.    Results for orders placed or performed in visit on 05/06/22  POC Rapid Strep A  Result Value Ref Range   Rapid Strep A Screen Positive (A) Negative  POC COVID-19  Result Value Ref Range   SARS Coronavirus 2 Ag Negative Negative       Assessment & Plan:   Problem List Items Addressed This Visit   None Visit Diagnoses     Strep pharyngitis    -  Primary   Relevant Medications   augmented betamethasone dipropionate (DIPROLENE-AF) 0.05 % ointment   amoxicillin (AMOXIL) 875 MG tablet   magic mouthwash w/lidocaine SOLN   Other Relevant Orders   POCT rapid strep A   Non-recurrent acute suppurative otitis media of right ear without spontaneous rupture of tympanic membrane       Relevant Medications   amoxicillin (AMOXIL) 875 MG tablet   Allergy, subsequent encounter  Sore throat (viral)       Relevant Medications   magic mouthwash w/lidocaine SOLN       Meds ordered this encounter  Medications   amoxicillin (AMOXIL) 875 MG tablet    Sig: Take 1 tablet (875 mg total) by mouth 2 (two) times daily.    Dispense:  14 tablet    Refill:  0   magic mouthwash w/lidocaine SOLN    Sig: Take 5 mLs by mouth 3 (three) times daily as needed for up to 10 days for mouth pain.    Dispense:  75 mL    Refill:  ML    Return if symptoms worsen or fail to improve.  Loralee Pacas, MD

## 2022-06-12 ENCOUNTER — Encounter (HOSPITAL_COMMUNITY): Payer: Self-pay

## 2022-06-12 ENCOUNTER — Other Ambulatory Visit: Payer: Self-pay

## 2022-06-12 ENCOUNTER — Emergency Department (HOSPITAL_COMMUNITY): Payer: Medicaid Other

## 2022-06-12 ENCOUNTER — Emergency Department (HOSPITAL_COMMUNITY)
Admission: EM | Admit: 2022-06-12 | Discharge: 2022-06-12 | Disposition: A | Discharge status: Home / Self Care | Payer: Medicaid Other | Attending: Emergency Medicine | Admitting: Emergency Medicine

## 2022-06-12 DIAGNOSIS — J45909 Unspecified asthma, uncomplicated: Secondary | ICD-10-CM | POA: Insufficient documentation

## 2022-06-12 DIAGNOSIS — R079 Chest pain, unspecified: Secondary | ICD-10-CM

## 2022-06-12 DIAGNOSIS — K219 Gastro-esophageal reflux disease without esophagitis: Secondary | ICD-10-CM | POA: Insufficient documentation

## 2022-06-12 LAB — BASIC METABOLIC PANEL
Anion gap: 7 (ref 5–15)
BUN: 9 mg/dL (ref 6–20)
CO2: 20 mmol/L — ABNORMAL LOW (ref 22–32)
Calcium: 8.1 mg/dL — ABNORMAL LOW (ref 8.9–10.3)
Chloride: 111 mmol/L (ref 98–111)
Creatinine, Ser: 0.9 mg/dL (ref 0.44–1.00)
GFR, Estimated: >60 mL/min (ref >60)
Glucose, Bld: 118 mg/dL — ABNORMAL HIGH (ref 70–99)
Potassium: 3.4 mmol/L — ABNORMAL LOW (ref 3.5–5.1)
Sodium: 138 mmol/L (ref 135–145)

## 2022-06-12 LAB — CBC
HCT: 38.1 % (ref 36.0–46.0)
Hemoglobin: 11.6 g/dL — ABNORMAL LOW (ref 12.0–15.0)
MCH: 21.4 pg — ABNORMAL LOW (ref 26.0–34.0)
MCHC: 30.4 g/dL (ref 30.0–36.0)
MCV: 70.3 fL — ABNORMAL LOW (ref 80.0–100.0)
Platelets: 366 10*3/uL (ref 150–400)
RBC: 5.42 MIL/uL — ABNORMAL HIGH (ref 3.87–5.11)
RDW: 16.4 % — ABNORMAL HIGH (ref 11.5–15.5)
WBC: 5.3 10*3/uL (ref 4.0–10.5)
nRBC: 0 % (ref 0.0–0.2)

## 2022-06-12 LAB — TROPONIN I (HIGH SENSITIVITY): Troponin I (High Sensitivity): <2 ng/L (ref <18)

## 2022-06-12 LAB — HCG, QUANTITATIVE, PREGNANCY: hCG, Beta Chain, Quant, S: <1 m[IU]/mL (ref <5)

## 2022-06-12 MED ORDER — PANTOPRAZOLE SODIUM 40 MG PO TBEC: 40.0000 mg | DELAYED_RELEASE_TABLET | Freq: Every day | ORAL | 0 refills | Status: AC

## 2022-06-12 MED ORDER — ALUM & MAG HYDROXIDE-SIMETH 200-200-20 MG/5ML PO SUSP
30.0000 mL | Freq: Once | ORAL | Status: AC
  Administered 2022-06-12: 30 mL via ORAL
  Filled 2022-06-12: qty 30

## 2022-06-12 MED ORDER — LIDOCAINE VISCOUS HCL 2 % MT SOLN
15.0000 mL | Freq: Once | OROMUCOSAL | Status: AC
  Administered 2022-06-12: 15 mL via OROMUCOSAL
  Filled 2022-06-12: qty 15

## 2022-06-12 MED ORDER — POTASSIUM CHLORIDE CRYS ER 20 MEQ PO TBCR
40.0000 meq | EXTENDED_RELEASE_TABLET | Freq: Once | ORAL | Status: AC
  Administered 2022-06-12: 40 meq via ORAL
  Filled 2022-06-12: qty 2

## 2022-06-12 NOTE — ED Provider Notes (Signed)
[facility] DEPT Provider Note   CSN: [id number] Arrival date & time: 06/12/22  3557     History  Chief Complaint  Patient presents with   Chest Pain   Emesis    Glenda Mendez is a 31 y.o. female with medical history of PCOS, hearing loss in right ear, asthma, ADHD.  Patient presents to ED for evaluation of chest pain. Patient states that her chest pain, nausea and vomiting began this morning around 5:30 AM she was driving her boyfriend to work. Patient states she had a sudden onset of chest pain located centrally described as burning.  The patient reports that she did had 1 episode of nonbloody nonbilious emesis.  Patient reports that she presented to ED for evaluation.  Patient denies radiation of this pain, denies any associated shortness of breath.  Patient denies history of cardiac issues.  The patient states that she does have a diagnosis of GERD, does not take her GERD medication which is omeprazole.  Patient denies any fevers, abdominal pain, diarrhea, shortness of breath, leg swelling, lightheadedness, dizziness, weakness.   Chest Pain Associated symptoms: nausea and vomiting   Associated symptoms: no abdominal pain, no dizziness, no fever, no shortness of breath and no weakness   Emesis Associated symptoms: no abdominal pain, no diarrhea and no fever        Home Medications Prior to Admission medications   Medication Sig Start Date End Date Taking? Authorizing Provider  pantoprazole (PROTONIX) 40 MG tablet Take 1 tablet (40 mg total) by mouth daily. 06/12/22  Yes Azucena Cecil, PA-C  albuterol (PROVENTIL) (2.5 MG/3ML) 0.083% nebulizer solution Take 3 mLs (2.5 mg total) by nebulization every 4 (four) hours as needed for wheezing or shortness of breath. 06/22/19   Hedges, Dellis Filbert, PA-C  albuterol (VENTOLIN HFA) 108 (90 Base) MCG/ACT inhaler Inhale 2 puffs into the lungs every 6 (six) hours as needed for wheezing or shortness of breath.  06/22/19   Hedges, Dellis Filbert, PA-C  amoxicillin (AMOXIL) 875 MG tablet Take 1 tablet (875 mg total) by mouth 2 (two) times daily. 06/04/22   Loralee Pacas, MD  amoxicillin-clavulanate (AUGMENTIN) 875-125 MG tablet Take 1 tablet by mouth 2 (two) times daily. 05/06/22   Laurey Morale, MD  augmented betamethasone dipropionate (DIPROLENE-AF) 0.05 % ointment Apply topically. 06/07/21   [provider]  cetirizine (ZYRTEC) 10 MG tablet Take 1 tablet by mouth daily. 06/06/13   [provider]  desonide (DESOWEN) 0.05 % cream Apply 1-2 times a day on the face and armpits. 09/26/20   [provider]  dicyclomine (BENTYL) 20 MG tablet Take 1 tablet (20 mg total) by mouth 2 (two) times daily. 12/27/21   Evlyn Courier, PA-C  EPINEPHrine 0.3 mg/0.3 mL IJ SOAJ injection Inject 0.3 mLs (0.3 mg total) into the muscle as needed for anaphylaxis. 04/25/20   Panosh, Standley Brooking, MD  fluticasone (FLONASE) 50 MCG/ACT nasal spray Place 2 sprays into both nostrils daily. 10/16/21   Blanchie Dessert, MD  fluticasone-salmeterol (ADVAIR HFA) 230-21 MCG/ACT inhaler Inhale 2 puffs into the lungs 2 (two) times daily. 08/31/18   Brand Males, MD  hydrOXYzine (ATARAX/VISTARIL) 25 MG tablet Take 1-2 tablets (25-50 mg total) by mouth every 6 (six) hours as needed for itching. 04/25/20   Panosh, Standley Brooking, MD  ipratropium (ATROVENT) 0.02 % nebulizer solution USE 1 VIAL BY NEBULIZATION 3 (THREE) TIMES DAILY AS NEEDED FOR WHEEZING. **DUE FOR ANNUAL VISIT June 2019** 06/22/19   Okey Regal,  PA-C  levocetirizine (XYZAL ALLERGY 24HR) 5 MG tablet Take 1 tablet (5 mg total) by mouth every evening. 02/18/17   Nafziger, [name], NP  loratadine (CLARITIN) 10 MG tablet Take 1 tablet (10 mg total) by mouth daily. 10/16/21   Blanchie Dessert, MD  magic mouthwash w/lidocaine SOLN Take 5 mLs by mouth 3 (three) times daily as needed for up to 10 days for mouth pain. 06/04/22 06/14/22  Loralee Pacas, MD  metFORMIN (GLUCOPHAGE-XR) 500 MG 24  hr tablet Take 1 tablet (500 mg total) by mouth daily with breakfast. 10/19/18 11/27/28  Panosh, Standley Brooking, MD  methylphenidate 36 MG PO CR tablet TAKE (2) TABLETS BY MOUTH DAILY. Patient taking differently: Take 72 mg by mouth daily. 01/19/19   Panosh, Standley Brooking, MD  montelukast (SINGULAIR) 10 MG tablet Take 1 tablet by mouth daily. 01/23/14   [provider]  norgestimate-ethinyl estradiol (ORTHO-CYCLEN) 0.25-35 MG-MCG tablet Take 1 tablet by mouth daily. 01/23/22   Laury Deep, CNM  ondansetron (ZOFRAN-ODT) 4 MG disintegrating tablet Take 1 tablet (4 mg total) by mouth every 8 (eight) hours as needed for nausea or vomiting. 12/27/21   Deatra Canter, Amjad, PA-C  sucralfate (CARAFATE) 1 g tablet Take 1 tablet (1 g total) by mouth 4 (four) times daily -  with meals and at bedtime for 10 days. 12/05/20 12/15/20  Curatolo, Adam, DO  VITAMIN D PO Take by mouth. Takes the gummies    [provider]  famotidine (PEPCID) 20 MG tablet Take 1 tablet (20 mg total) by mouth 2 (two) times daily. Take twice daily for 3 days, then as needed if symptoms recur Patient not taking: Reported on 11/28/2019 10/27/19 11/28/19  Charlann Lange, PA-C      Allergies    Patient has no known allergies.    Review of Systems   Review of Systems  Constitutional:  Negative for fever.  Respiratory:  Negative for shortness of breath.   Cardiovascular:  Positive for chest pain.  Gastrointestinal:  Positive for nausea and vomiting. Negative for abdominal pain and diarrhea.  Neurological:  Negative for dizziness, weakness and light-headedness.  All other systems reviewed and are negative.   Physical Exam Updated Vital Signs BP (!) 143/86 (BP Location: Right Arm)   Pulse 91   Temp 98.9 F (37.2 C) (Oral)   Resp 18   Ht '5\' 7"'$  (1.702 m)   Wt 127 kg   SpO2 99%   BMI 43.85 kg/m  Physical Exam Vitals and nursing note reviewed.  Constitutional:      General: She is not in acute distress.    Appearance: Normal appearance. She  is not ill-appearing, toxic-appearing or diaphoretic.  HENT:     Head: Normocephalic and atraumatic.     Nose: Nose normal. No congestion.     Mouth/Throat:     Mouth: Mucous membranes are moist.     Pharynx: Oropharynx is clear.  Eyes:     Extraocular Movements: Extraocular movements intact.     Conjunctiva/sclera: Conjunctivae normal.     Pupils: Pupils are equal, round, and reactive to light.  Cardiovascular:     Rate and Rhythm: Normal rate and regular rhythm.  Pulmonary:     Effort: Pulmonary effort is normal.     Breath sounds: Normal breath sounds. No wheezing.  Abdominal:     General: Abdomen is flat. Bowel sounds are normal.     Palpations: Abdomen is soft.     Tenderness: There is no abdominal tenderness.  Musculoskeletal:  Cervical back: Normal range of motion and neck supple. No tenderness.     Right lower leg: No edema.     Left lower leg: No edema.  Skin:    General: Skin is warm and dry.     Capillary Refill: Capillary refill takes less than 2 seconds.  Neurological:     Mental Status: She is alert and oriented to person, place, and time.     ED Results / Procedures / Treatments   Labs (all labs ordered are listed, but only abnormal results are displayed) Labs Reviewed  BASIC METABOLIC PANEL - Abnormal; Notable for the following components:      Result Value   Potassium 3.4 (*)    CO2 20 (*)    Glucose, Bld 118 (*)    Calcium 8.1 (*)    All other components within normal limits  CBC - Abnormal; Notable for the following components:   RBC 5.42 (*)    Hemoglobin 11.6 (*)    MCV 70.3 (*)    MCH 21.4 (*)    RDW 16.4 (*)    All other components within normal limits  HCG, QUANTITATIVE, PREGNANCY  TROPONIN I (HIGH SENSITIVITY)    EKG EKG Interpretation  Date/Time:  Thursday June 12 2022 06:14:00 EDT Ventricular Rate:  88 PR Interval:  120 QRS Duration: 80 QT Interval:  363 QTC Calculation: 440 R Axis:   77 Text Interpretation: Sinus rhythm  Borderline T abnormalities, anterior leads Confirmed by Quintella Reichert [phone]) on 06/12/2022 6:34:45 AM  Radiology DG Chest 2 View  Result Date: 06/12/2022 CLINICAL DATA:  31 year old female with chest pain, nausea, congestion, weakness. EXAM: CHEST - 2 VIEW COMPARISON:  Chest radiographs 12/27/2021 and earlier. FINDINGS: Lung volumes and mediastinal contours remain normal. Lung markings are stable since 2015. Both lungs appear clear. No pneumothorax or pleural effusion. Visualized tracheal air column is within normal limits. No osseous abnormality identified. Negative visible bowel gas. External spring-like metallic artifact projecting over the right supraclavicular region. IMPRESSION: Negative.  No cardiopulmonary abnormality. Electronically Signed   By: Genevie Ann M.D.   On: 06/12/2022 06:15    Procedures Procedures   Medications Ordered in ED Medications  potassium chloride SA (KLOR-CON M) CR tablet 40 mEq (has no administration in time range)  alum & mag hydroxide-simeth (MAALOX/MYLANTA) 200-200-20 MG/5ML suspension 30 mL (30 mLs Oral Given 06/12/22 0648)  lidocaine (XYLOCAINE) 2 % viscous mouth solution 15 mL (15 mLs Mouth/Throat Given 06/12/22 7829)    ED Course/ Medical Decision Making/ A&P                           Medical Decision Making Amount and/or Complexity of Data Reviewed Labs: ordered. Radiology: ordered.  Risk OTC drugs. Prescription drug management.   31 year old female presents to ED for evaluation of nausea vomiting and chest pain.  Please see HPI for further details.  On examination, the patient is afebrile and nontachycardic.  Patient lung sounds are clear bilaterally, she is not hypoxic on room air.  Patient abdomen is soft and compressible.  The patient neurological examination shows no focal neurodeficits.  The patient does not have any pitting edema to her lower extremities.  The patient is nontoxic in appearance.  The patient is speaking in complete sentences.   Negative Levine sign.  Patient worked up utilizing the following labs and imaging studies interpreted by me personally: - BMP with decreased potassium to 3.4 repleted with 40 mEq of  potassium - CBC unremarkable however does have decreased hemoglobin 11.6, this is in line with patient baseline - Troponin less than 2, patient denies any chest pain - Plain film imaging of chest shows no consolidation, effusion, cardiomegaly - EKG nonischemic  Patient treated with GI cocktail consisting of Maalox, viscous lidocaine.  Patient reports that all of her symptoms have abated after these were given.  The patient states she feels better.  The patient will be sent home and advised to continue taking her omeprazole for her GERD.  The patient was advised that taking her omeprazole every day and being compliant on this medication will help avoid further instances of nausea, vomiting and chest pain.  The patient voiced understanding of my instructions.  The patient had all of her questions answered to her satisfaction prior to discharge.  The patient is stable for discharge.  Final Clinical Impression(s) / ED Diagnoses Final diagnoses:  Gastroesophageal reflux disease, unspecified whether esophagitis present  Chest pain, unspecified type    Rx / DC Orders ED Discharge Orders          Ordered    pantoprazole (PROTONIX) 40 MG tablet  Daily        06/12/22 0842              Azucena Cecil, PA-C 06/12/22 1594    Lajean Saver, MD 06/12/22 1300

## 2022-06-12 NOTE — Discharge Instructions (Signed)
Please return to the ED with any new or worsening signs or symptoms such as chest pain, shortness of breath, loss of consciousness Please begin taking your pantoprazole every day.  I have refilled this medication and sent in and your pharmacy. Please review the attached documentation concerning food choices for GERD in adults

## 2022-06-12 NOTE — ED Provider Triage Note (Signed)
Emergency Medicine Provider Triage Evaluation Note  Glenda Mendez , a 31 y.o. female  was evaluated in triage.  Pt complains of chest pain, nausea and vomiting.  Patient states this morning around 5:30 AM she was driving her boyfriend to work when she had a sudden onset of chest pain located centrally described as burning.  The patient reports that she did had 1 episode of nonbloody nonbilious emesis.  Patient reports that she presented to ED for evaluation.  Patient denies radiation of this pain, denies any associated shortness of breath.  Patient denies history of cardiac issues.  The patient states that she does have a diagnosis of GERD, does not take her GERD medication which is omeprazole.  Patient denies any fevers, abdominal pain, diarrhea, shortness of breath, leg swelling.  Review of Systems  Positive:  Negative:   Physical Exam  BP (!) 143/86 (BP Location: Right Arm)   Pulse 91   Temp 98.9 F (37.2 C) (Oral)   Resp 18   Ht '5\' 7"'$  (1.702 m)   Wt 127 kg   SpO2 99%   BMI 43.85 kg/m  Gen:   Awake, no distress   Resp:  Normal effort  MSK:   Moves extremities without difficulty  Other:  Regular rate rhythm.  Lung sounds clear bilaterally.  No lower extremity edema.  Medical Decision Making  Medically screening exam initiated at 6:44 AM.  Appropriate orders placed.  Glenda Mendez was informed that the remainder of the evaluation will be completed by another provider, this initial triage assessment does not replace that evaluation, and the importance of remaining in the ED until their evaluation is complete.     Azucena Cecil, PA-C 06/12/22 [phone]

## 2022-06-12 NOTE — ED Triage Notes (Signed)
Pt reports with chest pain and vomiting since this morning.

## 2022-07-25 ENCOUNTER — Emergency Department (HOSPITAL_COMMUNITY)
Admission: EM | Admit: 2022-07-25 | Discharge: 2022-07-26 | Disposition: A | Discharge status: Home / Self Care | Payer: Medicaid Other | Attending: Emergency Medicine | Admitting: Emergency Medicine

## 2022-07-25 DIAGNOSIS — J45909 Unspecified asthma, uncomplicated: Secondary | ICD-10-CM | POA: Insufficient documentation

## 2022-07-25 DIAGNOSIS — R2990 Unspecified symptoms and signs involving the nervous system: Secondary | ICD-10-CM | POA: Insufficient documentation

## 2022-07-25 DIAGNOSIS — R55 Syncope and collapse: Secondary | ICD-10-CM | POA: Insufficient documentation

## 2022-07-26 ENCOUNTER — Other Ambulatory Visit: Payer: Self-pay

## 2022-07-26 ENCOUNTER — Emergency Department (HOSPITAL_COMMUNITY): Payer: Medicaid Other

## 2022-07-26 ENCOUNTER — Encounter (HOSPITAL_COMMUNITY): Payer: Self-pay | Admitting: Emergency Medicine

## 2022-07-26 DIAGNOSIS — R55 Syncope and collapse: Secondary | ICD-10-CM

## 2022-07-26 LAB — I-STAT CHEM 8, ED
BUN: 9 mg/dL (ref 6–20)
Calcium, Ion: 1.03 mmol/L — ABNORMAL LOW (ref 1.15–1.40)
Chloride: 103 mmol/L (ref 98–111)
Creatinine, Ser: 1 mg/dL (ref 0.44–1.00)
Glucose, Bld: 103 mg/dL — ABNORMAL HIGH (ref 70–99)
HCT: 37 % (ref 36.0–46.0)
Hemoglobin: 12.6 g/dL (ref 12.0–15.0)
Potassium: 3.8 mmol/L (ref 3.5–5.1)
Sodium: 137 mmol/L (ref 135–145)
TCO2: 23 mmol/L (ref 22–32)

## 2022-07-26 LAB — DIFFERENTIAL
Abs Immature Granulocytes: 0.02 10*3/uL (ref 0.00–0.07)
Basophils Absolute: 0 10*3/uL (ref 0.0–0.1)
Basophils Relative: 0 %
Eosinophils Absolute: 0.4 10*3/uL (ref 0.0–0.5)
Eosinophils Relative: 7 %
Immature Granulocytes: 0 %
Lymphocytes Relative: 27 %
Lymphs Abs: 1.8 10*3/uL (ref 0.7–4.0)
Monocytes Absolute: 0.4 10*3/uL (ref 0.1–1.0)
Monocytes Relative: 6 %
Neutro Abs: 4 10*3/uL (ref 1.7–7.7)
Neutrophils Relative %: 60 %

## 2022-07-26 LAB — COMPREHENSIVE METABOLIC PANEL
ALT: 20 U/L (ref 0–44)
AST: 21 U/L (ref 15–41)
Albumin: 3.6 g/dL (ref 3.5–5.0)
Alkaline Phosphatase: 113 U/L (ref 38–126)
Anion gap: 10 (ref 5–15)
BUN: 8 mg/dL (ref 6–20)
CO2: 22 mmol/L (ref 22–32)
Calcium: 8.7 mg/dL — ABNORMAL LOW (ref 8.9–10.3)
Chloride: 105 mmol/L (ref 98–111)
Creatinine, Ser: 1.05 mg/dL — ABNORMAL HIGH (ref 0.44–1.00)
GFR, Estimated: >60 mL/min (ref >60)
Glucose, Bld: 106 mg/dL — ABNORMAL HIGH (ref 70–99)
Potassium: 3.8 mmol/L (ref 3.5–5.1)
Sodium: 137 mmol/L (ref 135–145)
Total Bilirubin: 0.5 mg/dL (ref 0.3–1.2)
Total Protein: 7.2 g/dL (ref 6.5–8.1)

## 2022-07-26 LAB — I-STAT BETA HCG BLOOD, ED (MC, WL, AP ONLY): I-stat hCG, quantitative: <5 m[IU]/mL (ref <5)

## 2022-07-26 LAB — APTT: aPTT: 27 seconds (ref 24–36)

## 2022-07-26 LAB — CBC
HCT: 36 % (ref 36.0–46.0)
Hemoglobin: 11.2 g/dL — ABNORMAL LOW (ref 12.0–15.0)
MCH: 21.9 pg — ABNORMAL LOW (ref 26.0–34.0)
MCHC: 31.1 g/dL (ref 30.0–36.0)
MCV: 70.3 fL — ABNORMAL LOW (ref 80.0–100.0)
Platelets: 399 10*3/uL (ref 150–400)
RBC: 5.12 MIL/uL — ABNORMAL HIGH (ref 3.87–5.11)
RDW: 15.9 % — ABNORMAL HIGH (ref 11.5–15.5)
WBC: 6.7 10*3/uL (ref 4.0–10.5)
nRBC: 0 % (ref 0.0–0.2)

## 2022-07-26 LAB — PROTIME-INR
INR: 1.2 (ref 0.8–1.2)
Prothrombin Time: 14.7 seconds (ref 11.4–15.2)

## 2022-07-26 LAB — ETHANOL: Alcohol, Ethyl (B): <10 mg/dL (ref <10)

## 2022-07-26 MED ORDER — IOHEXOL 350 MG/ML SOLN
75.0000 mL | Freq: Once | INTRAVENOUS | Status: AC | PRN
  Administered 2022-07-26: 75 mL via INTRAVENOUS

## 2022-07-26 MED ORDER — DIPHENHYDRAMINE HCL 50 MG/ML IJ SOLN
25.0000 mg | Freq: Once | INTRAMUSCULAR | Status: AC
  Administered 2022-07-26: 25 mg via INTRAVENOUS
  Filled 2022-07-26: qty 1

## 2022-07-26 MED ORDER — SODIUM CHLORIDE 0.9 % IV BOLUS
1000.0000 mL | Freq: Once | INTRAVENOUS | Status: AC
  Administered 2022-07-26: 1000 mL via INTRAVENOUS

## 2022-07-26 MED ORDER — PROCHLORPERAZINE EDISYLATE 10 MG/2ML IJ SOLN
10.0000 mg | Freq: Once | INTRAMUSCULAR | Status: AC
  Administered 2022-07-26: 10 mg via INTRAVENOUS
  Filled 2022-07-26: qty 2

## 2022-07-26 NOTE — ED Notes (Signed)
Patient transported to MRI 
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
## 2022-07-26 NOTE — ED Notes (Signed)
Patient states not able to provide urine at this time but will provide when able

## 2022-07-26 NOTE — ED Notes (Signed)
Returned from ct at this time ?

## 2022-07-26 NOTE — Discharge Instructions (Signed)
You are leaving the hospital [facility], we discussed the risks of leaving, including worsening condition, stroke, death.  Return to the emergency department at any time for continued care.
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
## 2022-07-26 NOTE — Code Documentation (Signed)
Responded to Code Stroke called at [phone] upon arrival to ED. Code Stroke called for slurred speech and R sided numbness/weakness. NIH-2 for R arm drift and R sensory deficit. CT head negative for acute changes, CTA-no LVO. TNK not given. Plan for MRI.

## 2022-07-26 NOTE — ED Notes (Signed)
Pt transported to ct at this time 

## 2022-07-26 NOTE — ED Provider Notes (Signed)
[facility] Emergency Department Provider Note MRN:  [id number]  Arrival date & time: 07/26/22     Chief Complaint   Seizure History of Present Illness   Glenda Mendez is a 31 y.o. year-old female with a history of  presenting to the ED with chief complaint of seizure.  [place] suddenly developed a headache, became unresponsive, was lowered to the ground by boyfriend, report of her eyes deviated upward "into the back of her head".  Report unclear if she was exhibiting tonic-clonic behavior.  In route EMS noting speech disturbance, question slurred speech.  Currently she is endorsing numbness and weakness to the right leg.  Review of Systems  A thorough review of systems was obtained and all systems are negative except as noted in the HPI and PMH.   Patient's Health History    Past Medical History:  Diagnosis Date   ADHD (attention deficit hyperactivity disorder)    Allergy    Asthma    BMI (body mass index), pediatric, 95-99% for age 38/10/2010   Is starting to gain weight again and advised and counseled today about reduction for health risk reasons. Patient is very well aware of how to do lifestyle intervention she has done this before.    Hearing loss in right ear    with hearing aid bilateral    Hemiparesis (HCC)    rt from neonatal period   Hypoxia of newborn    Irregular periods 06/04/2011   PCOS (polycystic ovarian syndrome)    Pulmonary hemorrhage of fetus or newborn    82 week C-section [facility] of with birth hypoxia    Past Surgical History:  Procedure Laterality Date   CANNULATION FOR ECMO (EXTRACORPOREAL MEMBRANE OXYGENATION)  1992-02-12   TONSILLECTOMY      Family History  Adopted: Yes  Problem Relation Age of Onset   Hypertension Other    Schizophrenia Mother        with alcohol drug abuse   Diabetes Other     Social History   Socioeconomic History   Marital status: Single    Spouse name: Not  on file   Number of children: Not on file   Years of education: Not on file   Highest education level: Not on file  Occupational History   Not on file  Tobacco Use   Smoking status: Never    Passive exposure: Never   Smokeless tobacco: Never  Vaping Use   Vaping Use: Never used  Substance and Sexual Activity   Alcohol use: No    Alcohol/week: 0.0 standard drinks of alcohol   Drug use: No   Sexual activity: Yes    Partners: Male    Birth control/protection: None  Other Topics Concern   Not on file  Social History Narrative   Adopted and raised by family member [name]   [employer] high school  graduate [place] second semester   Living apt  [employer] other job    Sleep 8+ hours   Prev Dr. [name]- pulm at [facility] now has a new pulmonary Dr. Now stable and on prn       Thyroid evaluation in the past neck evaluation within normal limits and no thyroid disease.   Net tad   Social Determinants of Health   Financial Resource Strain: Not on file  Food Insecurity: Not on file  Transportation Needs: Not on file  Physical Activity: Not on file  Stress: Not  on file  Social Connections: Not on file  Intimate Partner Violence: Not on file     Physical Exam   Vitals:   07/26/22 0200 07/26/22 0300  BP: 118/67 (!) 126/90  Pulse: 77 70  Resp: 16 16  Temp:    SpO2: 100% 96%    CONSTITUTIONAL: Well-appearing, NAD NEURO/PSYCH:  Alert and oriented x 3, slow, stuttering, mildly slurred speech, weakness noted to the right leg, subjective loss of sensation to the right leg EYES:  eyes equal and reactive ENT/NECK:  no LAD, no JVD CARDIO: Regular rate, well-perfused, normal S1 and S2 PULM:  CTAB no wheezing or rhonchi GI/GU:  non-distended, non-tender MSK/SPINE:  No gross deformities, no edema SKIN:  no rash, atraumatic   *Additional and/or pertinent findings included in MDM below  Diagnostic and Interventional Summary    EKG Interpretation  Date/Time:  Saturday  July 26 2022 00:44:36 EDT Ventricular Rate:  84 PR Interval:  123 QRS Duration: 86 QT Interval:  381 QTC Calculation: 451 R Axis:   59 Text Interpretation: Sinus rhythm Borderline T wave abnormalities No significant change was found Confirmed by Gerlene Fee [phone]) on 07/26/2022 1:25:19 AM       Labs Reviewed  CBC - Abnormal; Notable for the following components:      Result Value   RBC 5.12 (*)    Hemoglobin 11.2 (*)    MCV 70.3 (*)    MCH 21.9 (*)    RDW 15.9 (*)    All other components within normal limits  COMPREHENSIVE METABOLIC PANEL - Abnormal; Notable for the following components:   Glucose, Bld 106 (*)    Creatinine, Ser 1.05 (*)    Calcium 8.7 (*)    All other components within normal limits  I-STAT CHEM 8, ED - Abnormal; Notable for the following components:   Glucose, Bld 103 (*)    Calcium, Ion 1.03 (*)    All other components within normal limits  ETHANOL  PROTIME-INR  APTT  DIFFERENTIAL  RAPID URINE DRUG SCREEN, HOSP PERFORMED  URINALYSIS, ROUTINE W REFLEX MICROSCOPIC  I-STAT BETA HCG BLOOD, ED (MC, WL, AP ONLY)    CT C-SPINE NO CHARGE  Final Result    CT ANGIO HEAD NECK W WO CM  Final Result    CT HEAD CODE STROKE WO CONTRAST  Final Result    MR BRAIN WO CONTRAST    (Results Pending)  MR Cervical Spine Wo Contrast    (Results Pending)    Medications  iohexol (OMNIPAQUE) 350 MG/ML injection 75 mL (75 mLs Intravenous Contrast Given 07/26/22 0020)  diphenhydrAMINE (BENADRYL) injection 25 mg (25 mg Intravenous Given 07/26/22 0116)  prochlorperazine (COMPAZINE) injection 10 mg (10 mg Intravenous Given 07/26/22 0116)  sodium chloride 0.9 % bolus 1,000 mL (0 mLs Intravenous Stopped 07/26/22 0215)     Procedures  /  Critical Care .Critical Care  Performed by: Maudie Flakes, MD Authorized by: Maudie Flakes, MD   Critical care provider statement:    Critical care time (minutes):  35   Critical care was necessary to treat or prevent imminent or  life-threatening deterioration of the following conditions:  CNS failure or compromise   Critical care was time spent personally by me on the following activities:  Development of treatment plan with patient or surrogate, discussions with consultants, evaluation of patient's response to treatment, examination of patient, ordering and review of laboratory studies, ordering and review of radiographic studies, ordering and performing treatments and interventions, pulse  oximetry, re-evaluation of patient's condition and review of old charts Ultrasound ED Peripheral IV (Provider)  Date/Time: 07/26/2022 12:44 AM  Performed by: Maudie Flakes, MD Authorized by: Maudie Flakes, MD   Procedure details:    Indications: multiple failed IV attempts     Skin Prep: chlorhexidine gluconate     Location:  Right AC   Angiocath:  20 G   Bedside Ultrasound Guided: Yes     Patient tolerated procedure without complications: Yes     Dressing applied: Yes     ED Course and Medical Decision Making  Initial Impression and Ddx Differential diagnosis includes subarachnoid hemorrhage, Todd's paralysis, acute ischemic stroke, syncope with conversion disorder.  Given the onset of neurological symptoms only 10 to 15 minutes ago, code stroke is initiated.  Past medical/surgical history that increases complexity of ED encounter: ECMO as a newborn  Interpretation of Diagnostics I personally reviewed the EKG and my interpretation is as follows: Sinus rhythm  Labs reassuring with no significant blood count or electrolyte disturbance  Patient Reassessment and Ultimate Disposition/Management Clinical Course as of 07/26/22 0332  Sat Jul 26, 2022  0125 CT imaging overall reassuring, awaiting MRI per neurology recommendations.  If she returns back to baseline and has normal MRI imaging, she would be appropriate for discharge.  If not back to baseline, may need admission for EEG. [MB]    Clinical Course User Index [MB]  Maudie Flakes, MD     3:30 AM update: Patient is claustrophobic and refusing MRI imaging.  Wants to go home.  I explained that this is an incomplete work-up and we want to ensure her safety.  Still she refuses.  She is made aware of the risks of leaving, which include death.  Leaving AMA.  Patient management required discussion with the following services or consulting groups:  Neurology  Complexity of Problems Addressed Acute illness or injury that poses threat of life of bodily function  Additional Data Reviewed and Analyzed Further history obtained from: None  Additional Factors Impacting ED Encounter Risk Consideration of hospitalization  Barth Kirks. Sedonia Small, MD [employer] [email or web address]  Final Clinical Impressions(s) / ED Diagnoses     ICD-10-CM   1. Syncope and collapse  R55     2. Neurological complaint  R29.90       ED Discharge Orders     None        Discharge Instructions Discussed with and Provided to Patient:     Discharge Instructions      You are leaving the hospital [place], we discussed the risks of leaving, including worsening condition, stroke, death.  Return to the emergency department at any time for continued care.       Maudie Flakes, MD 07/26/22 [phone]

## 2022-07-28 ENCOUNTER — Telehealth: Payer: Self-pay

## 2022-07-28 NOTE — Telephone Encounter (Signed)
Transition Care Management Follow-up Telephone Call Date of discharge and from where: 07/26/22 left AMA from [facility] ED How have you been since you were released from the hospital? Pt left AMA after refusing MRI due to anxiety/claustrophobia. Pt states she felt lightheaded after [employer], thinks it could be r/t not eating prior to.  Any questions or concerns? No  Items Reviewed: Did the pt receive and understand the discharge instructions provided?  Pt left AMA; no d/c instructions received.    Follow up appointments reviewed:  PCP Hospital f/u appt confirmed? Yes  Scheduled to see Dr Regis Bill on 08/04/22 @ 8:30. [facility] f/u appt confirmed? No May need possible referral to Neuro Are transportation arrangements needed? No  If their condition worsens, is the pt aware to call PCP or go to the Emergency Dept.? Yes Was the patient provided with contact information for the PCP's office or ED? Yes Was to pt encouraged to call back with questions or concerns? Yes

## 2022-07-29 ENCOUNTER — Telehealth: Payer: Self-pay | Admitting: Internal Medicine

## 2022-07-29 NOTE — Telephone Encounter (Signed)
Pt is calling and has an appt on 08-04-2022 with md and would like a letter that states she need her dog as emotional support animal due to ADHD

## 2022-07-31 NOTE — Telephone Encounter (Signed)
I dont do letters for emotional support animals ( out of my scope of practice ) . if she has a Social worker or behavioral health person  then could  discuss with them .

## 2022-08-04 ENCOUNTER — Encounter: Payer: Self-pay | Admitting: Internal Medicine

## 2022-08-04 ENCOUNTER — Ambulatory Visit (INDEPENDENT_AMBULATORY_CARE_PROVIDER_SITE_OTHER): Payer: Medicaid Other | Admitting: Internal Medicine

## 2022-08-04 VITALS — BP 118/78 | HR 79 | Temp 98.1°F | Wt 288.0 lb

## 2022-08-04 DIAGNOSIS — R059 Cough, unspecified: Secondary | ICD-10-CM

## 2022-08-04 DIAGNOSIS — J029 Acute pharyngitis, unspecified: Secondary | ICD-10-CM

## 2022-08-04 DIAGNOSIS — R739 Hyperglycemia, unspecified: Secondary | ICD-10-CM

## 2022-08-04 DIAGNOSIS — E88819 Insulin resistance, unspecified: Secondary | ICD-10-CM

## 2022-08-04 DIAGNOSIS — E282 Polycystic ovarian syndrome: Secondary | ICD-10-CM

## 2022-08-04 DIAGNOSIS — G8191 Hemiplegia, unspecified affecting right dominant side: Secondary | ICD-10-CM

## 2022-08-04 DIAGNOSIS — R55 Syncope and collapse: Secondary | ICD-10-CM

## 2022-08-04 LAB — POCT GLYCOSYLATED HEMOGLOBIN (HGB A1C): Hemoglobin A1C: 5.9 % — AB (ref 4.0–5.6)

## 2022-08-04 LAB — POC COVID19 BINAXNOW: SARS Coronavirus 2 Ag: NEGATIVE

## 2022-08-04 LAB — POCT RAPID STREP A (OFFICE): Rapid Strep A Screen: NEGATIVE

## 2022-08-04 NOTE — Progress Notes (Signed)
Chief Complaint  Patient presents with   Follow-up    From ED visit for fainting. Patient reports she is feeling shaky today with cold sx.     HPI: Glenda Mendez 31 y.o. come in for"fu Ed visit"  '10 6 23 '$ for symcop  she left ama   dx vagal vs  seizure  see neuro  eval  Patient declined mri cause of anxiety and felt the syncope would have been related to going [place]  previously . She feels she is better since the event the [place] that [place] she did not eat anything after breakfast had water and sweet tea.  Felt uncomfortable with the humid smoky environment a little bit lightheaded and at the end had to sit down had some water then when stood up began going to the parking lot she lost consciousness.  There was no seizure activity noted by friends.  Taken to ED by EMS.   Ha preceded the of consciousness was not severe and more of a lightheadedness Had numbness and weakness  r leg that was transient.  She has baseline right-sided weakness from perinatal neonatal injury.  She noted of what sounds like twisting or tripping ankle when going [place] that could have been related to the seconds of numbness that she felt on her right leg.  When in the ED it was noted that she had some right-sided weakness and since symptoms occurred most recently had code stroke i called neuro consult documented the mild right-sided weakness suggested MRI and if okay could be followed up did not think it was a seizure and did not order EEG that time she declined the MRI because she was given a closed MRI with something over her face and strapped in a bed and felt very uncomfortable and anxious.  Since this event she is felt back to normal except in the last number of days has had runny nose cough mild sore throat without associated fever.  Asthma has not flared.  ROS: See pertinent positives and negatives per HPI. Has had some days of runny nose and cough  no fever  She  is under care yearly GYN diagnosis PCOS.  Past Medical History:  Diagnosis Date   ADHD (attention deficit hyperactivity disorder)    Allergy    Asthma    BMI (body mass index), pediatric, 95-99% for age 34/10/2010   Is starting to gain weight again and advised and counseled today about reduction for health risk reasons. Patient is very well aware of how to do lifestyle intervention she has done this before.    Hearing loss in right ear    with hearing aid bilateral    Hemiparesis (HCC)    rt from neonatal period   Hypoxia of newborn    Irregular periods 06/04/2011   PCOS (polycystic ovarian syndrome)    Pulmonary hemorrhage of fetus or newborn    30 week C-section [facility] of with birth hypoxia    Family History  Adopted: Yes  Problem Relation Age of Onset   Hypertension Other    Schizophrenia Mother        with alcohol drug abuse   Diabetes Other     Social History   Socioeconomic History   Marital status: Single    Spouse name: Not on file   Number of children: Not on file   Years of education: Not on file   Highest education level:  Not on file  Occupational History   Not on file  Tobacco Use   Smoking status: Never    Passive exposure: Never   Smokeless tobacco: Never  Vaping Use   Vaping Use: Never used  Substance and Sexual Activity   Alcohol use: No    Alcohol/week: 0.0 standard drinks of alcohol   Drug use: No   Sexual activity: Yes    Partners: Male    Birth control/protection: None  Other Topics Concern   Not on file  Social History Narrative   Adopted and raised by family member Glenda Mendez   [employer] high school  graduate [place] second semester   Living apt  [employer] other job    Sleep 8+ hours   Prev Dr. [name]- pulm at [facility] now has a new pulmonary Dr. Now stable and on prn       Thyroid evaluation in the past neck evaluation within normal limits and no thyroid disease.   Net tad   Social Determinants of Health   Financial Resource  Strain: Not on file  Food Insecurity: Not on file  Transportation Needs: Not on file  Physical Activity: Not on file  Stress: Not on file  Social Connections: Not on file    Outpatient Medications Prior to Visit  Medication Sig Dispense Refill   albuterol (PROVENTIL) (2.5 MG/3ML) 0.083% nebulizer solution Take 3 mLs (2.5 mg total) by nebulization every 4 (four) hours as needed for wheezing or shortness of breath. 75 mL 2   albuterol (VENTOLIN HFA) 108 (90 Base) MCG/ACT inhaler Inhale 2 puffs into the lungs every 6 (six) hours as needed for wheezing or shortness of breath. 18 g 3   amoxicillin (AMOXIL) 875 MG tablet Take 1 tablet (875 mg total) by mouth 2 (two) times daily. 14 tablet 0   amoxicillin-clavulanate (AUGMENTIN) 875-125 MG tablet Take 1 tablet by mouth 2 (two) times daily. 20 tablet 0   augmented betamethasone dipropionate (DIPROLENE-AF) 0.05 % ointment Apply topically.     betamethasone dipropionate 0.05 % cream Apply topically.     cetirizine (ZYRTEC) 10 MG tablet Take 1 tablet by mouth daily.     desonide (DESOWEN) 0.05 % cream Apply 1-2 times a day on the face and armpits.     dicyclomine (BENTYL) 20 MG tablet Take 1 tablet (20 mg total) by mouth 2 (two) times daily. 20 tablet 0   EPINEPHrine 0.3 mg/0.3 mL IJ SOAJ injection Inject 0.3 mLs (0.3 mg total) into the muscle as needed for anaphylaxis. 1 each 0   fluticasone (FLONASE) 50 MCG/ACT nasal spray Place 2 sprays into both nostrils daily. 16 g 0   fluticasone-salmeterol (ADVAIR HFA) 230-21 MCG/ACT inhaler Inhale 2 puffs into the lungs 2 (two) times daily. 1 Inhaler 5   hydrOXYzine (ATARAX/VISTARIL) 25 MG tablet Take 1-2 tablets (25-50 mg total) by mouth every 6 (six) hours as needed for itching. 30 tablet 1   ipratropium (ATROVENT) 0.02 % nebulizer solution USE 1 VIAL BY NEBULIZATION 3 (THREE) TIMES DAILY AS NEEDED FOR WHEEZING. **DUE FOR ANNUAL VISIT June 2019** 62.5 mL 2   levocetirizine (XYZAL ALLERGY 24HR) 5 MG tablet Take  1 tablet (5 mg total) by mouth every evening. 90 tablet 3   loratadine (CLARITIN) 10 MG tablet Take 1 tablet (10 mg total) by mouth daily. 10 tablet 0   metFORMIN (GLUCOPHAGE-XR) 500 MG 24 hr tablet Take 1 tablet (500 mg total) by mouth daily with breakfast. 90 tablet 0   methylphenidate  36 MG PO CR tablet TAKE (2) TABLETS BY MOUTH DAILY. (Patient taking differently: Take 72 mg by mouth daily.) 60 tablet 0   montelukast (SINGULAIR) 10 MG tablet Take 1 tablet by mouth daily.     norgestimate-ethinyl estradiol (ORTHO-CYCLEN) 0.25-35 MG-MCG tablet Take 1 tablet by mouth daily. 28 tablet 12   ondansetron (ZOFRAN-ODT) 4 MG disintegrating tablet Take 1 tablet (4 mg total) by mouth every 8 (eight) hours as needed for nausea or vomiting. 20 tablet 0   pantoprazole (PROTONIX) 40 MG tablet Take 1 tablet (40 mg total) by mouth daily. 30 tablet 0   VITAMIN D PO Take by mouth. Takes the gummies     sucralfate (CARAFATE) 1 g tablet Take 1 tablet (1 g total) by mouth 4 (four) times daily -  with meals and at bedtime for 10 days. 40 tablet 0   No facility-administered medications prior to visit.     EXAM:  BP 118/78 (BP Location: Right Arm, Patient Position: Sitting, Cuff Size: Large)   Pulse 79   Temp 98.1 F (36.7 C) (Oral)   Wt 288 lb (130.6 kg)   SpO2 97%   BMI 45.11 kg/m   Body mass index is 45.11 kg/m.  GENERAL: vitals reviewed and listed above, alert, oriented, appears well hydrated and in no acute distress mild congestion no respiratory distress normal affect HEENT: atraumatic, conjunctiva  clear, no obvious abnormalities on inspection of external nose and ears TM clear  NECK: no obvious masses on inspection palpation  LUNGS: clear to auscultation bilaterally, no wheezes, rales or rhonchi, good air movement CV: HRRR, no clubbing cyanosis or  peripheral edema nl cap refill  Abdomen soft without again a megaly guarding or rebound Neurologic normal speech for her no clonus fasciculations gait  grossly normal at baseline weakness parameters not checked MS: moves all extremities without noticeable focal  abnormality PSYCH: pleasant and cooperative, no obvious depression or anxiety Lab Results  Component Value Date   WBC 6.7 07/26/2022   HGB 12.6 07/26/2022   HCT 37.0 07/26/2022   PLT 399 07/26/2022   GLUCOSE 103 (H) 07/26/2022   CHOL 210 (H) 04/02/2022   TRIG 49.0 04/02/2022   HDL 72.20 04/02/2022   LDLCALC 128 (H) 04/02/2022   ALT 20 07/26/2022   AST 21 07/26/2022   NA 137 07/26/2022   K 3.8 07/26/2022   CL 103 07/26/2022   CREATININE 1.00 07/26/2022   BUN 9 07/26/2022   CO2 22 07/26/2022   TSH 2.46 04/02/2022   INR 1.2 07/26/2022   HGBA1C 5.9 (A) 08/04/2022   BP Readings from Last 3 Encounters:  08/04/22 118/78  07/26/22 (!) 130/94  06/12/22 (!) 143/86  Ed visit reviewed   ASSESSMENT AND PLAN:  Discussed the following assessment and plan:  Syncope, unspecified syncope type - Plan: Ambulatory referral to Neurology  Sore throat - Plan: POC Rapid Strep A  Cough, unspecified type - Plan: POC COVID-19  PCOS (polycystic ovarian syndrome)  Hyperglycemia - Plan: POC HgB A1c  Insulin resistance  Right hemiparesis (HCC)  since neonatal period - hx orf ecmo and r carotid obs  and  r hearing loss  - Plan: Ambulatory referral to Neurology Based on history and recovery and context this is probably a vasovagal faint concern was because of unknown severity of her baseline right-sided weakness and carotid obstruction. I suppose she could have had a seizure related but her prodrome sounded like neurogenic syncope and doubt cardiac cause. Seizure always possible .  is known to have perinatal neonatal neurologic baseline deficits.  Because of this would advise neurologic consult and lieu of acute MRI that would have to be open and medicated. However if any condition deteriorates or recurs reevaluate more immediately. Discussed sugar drinks blood sugar A1c is improved  today.  Also I  declined to write emotional support animal for medical reasons for her living arrangement ( not in in practice parameters) although would be helpful for her to keep her pet ,she is not under treatment for nor has a counselor for her condition which includes ADHD  Suspect today's respiratory symptoms should self resolve and are viral in nature with no complication at this time. -Patient advised to return or notify health care team  if  new concerns arise.in interim  Record review counsel visit referral 45 minutes Patient Instructions  Covid negative  and probably allergy or a cold.  The faint  I agree was probably from no food increae caffiene and   the [place] .   However I will refer to neurology to have then opine if need for MRi ( open if needed)   and or eeg to r/o seizure  also .  In interim  stay hydrated avoid sugar drinks   Expect the congestion to run its course.  Shakiness could be from too much sugar and prediabetes PCOS. .  A1c today was better than last time.   Change  dec appt to February.   Standley Brooking. Moe Brier M.D.  Impression: 31 year old female with loss of consciousness immediately after going to a horror event.  My suspicion is that this likely represents vasovagal syncope, however given the delayed response, I do think other etiologies need to be considered.  The duration though it could be concerning for seizure, without any other signs of seizure I think makes this less likely.  The fact that she did have focal right-sided symptoms does raise the possibility of an embolus to the left carotid causing loss of consciousness followed by revascularization with distal embolization.  Given the mildness of her symptoms, would not favor tenecteplase at this point.  I do think an MRI of her brain be prudent. Given that she endorses intact temperature sensation with loss of light touch on the right, I do think a partial cord impingement that could  have happened when she fell should be investigated (though she has no point tenderness).   At this point I would recommend an MRI of her brain and cervical spine, however if this is negative, then I think she could likely follow-up as an outpatient.   Recommendations: 1) MRI brain and cervical spine 2) further recommendations following the above imaging     Roland Rack, MD [facility] [phone]

## 2022-08-04 NOTE — Telephone Encounter (Signed)
Spoke to patient. Patient is aware of provider's advise. Verbalized understanding. States she can try to find another way to help her with the letter.

## 2022-08-04 NOTE — Patient Instructions (Addendum)
Covid negative  and probably allergy or a cold.  The faint  I agree was probably from no food increae caffiene and   [place] .   However I will refer to neurology to have then opine if need for MRi ( open if needed)   and or eeg to r/o seizure  also .  In interim  stay hydrated avoid sugar drinks   Expect the congestion to run its course.  Shakiness could be from too much sugar and prediabetes PCOS. .  A1c today was better than last time.   Change  dec appt to February.

## 2022-08-04 NOTE — Telephone Encounter (Signed)
Pt had an appt with dr Regis Bill today and she has more info as to why she need emotional support animal her dog due to PCOS and her dog comfort her and she is deaf in her right ear completely and left ear she is partially deaf. Please advise

## 2022-08-15 ENCOUNTER — Telehealth: Payer: Self-pay | Admitting: Internal Medicine

## 2022-08-15 NOTE — Telephone Encounter (Signed)
Called patient due to appointment being cancel via mychart and in the comments the patient put changing doctors. I did not reach patient at first but she called back. Patient confirmed that she is planning to change doctors and will be leaving Dr.Panosh. Patient currently has not found a new provider yet, so wants to continue care here until then.       FYI

## 2022-08-27 NOTE — Telephone Encounter (Signed)
Noted  

## 2022-09-01 ENCOUNTER — Ambulatory Visit: Payer: Self-pay | Admitting: Neurology

## 2022-09-24 ENCOUNTER — Ambulatory Visit: Payer: Medicaid Other | Admitting: Internal Medicine

## 2022-09-24 ENCOUNTER — Emergency Department (HOSPITAL_COMMUNITY)
Admission: EM | Admit: 2022-09-24 | Discharge: 2022-09-24 | Disposition: A | Discharge status: Home / Self Care | Payer: Medicaid Other | Attending: Emergency Medicine | Admitting: Emergency Medicine

## 2022-09-24 ENCOUNTER — Encounter (HOSPITAL_COMMUNITY): Payer: Self-pay

## 2022-09-24 DIAGNOSIS — K0889 Other specified disorders of teeth and supporting structures: Secondary | ICD-10-CM

## 2022-09-24 DIAGNOSIS — K047 Periapical abscess without sinus: Secondary | ICD-10-CM | POA: Insufficient documentation

## 2022-09-24 MED ORDER — IBUPROFEN 800 MG PO TABS: 800.0000 mg | ORAL_TABLET | Freq: Three times a day (TID) | ORAL | 0 refills | Status: AC

## 2022-09-24 MED ORDER — AMOXICILLIN-POT CLAVULANATE 875-125 MG PO TABS: 1.0000 | ORAL_TABLET | Freq: Two times a day (BID) | ORAL | 0 refills | Status: DC

## 2022-09-24 NOTE — Discharge Instructions (Addendum)
You were seen today in the emergency department for jaw pain.  Take the antibiotic twice daily with food and water.  Take the ibuprofen 3 times daily also with food and water.  Follow-up with your dentist for reevaluation week.  Turn to the ED for fevers, inability to swallow, significant facial swelling or new and concerning symptoms.

## 2022-09-24 NOTE — ED Provider Notes (Signed)
[facility] DEPT Provider Note   CSN: [id number] Arrival date & time: 09/24/22  1600     History  Chief Complaint  Patient presents with   Jaw Pain    Glenda Mendez is a 31 y.o. female.  HPI   Patient presents due to left-sided lower jaw pain starting yesterday.  It is constant, worse with chewing.  Denies any fevers or chills, voice changes, injuries to the face, hearing changes although she chronically has poor hearing in her left ear.  Home Medications Prior to Admission medications   Medication Sig Start Date End Date Taking? Authorizing Provider  amoxicillin-clavulanate (AUGMENTIN) 875-125 MG tablet Take 1 tablet by mouth every 12 (twelve) hours. 09/24/22  Yes Sherrill Raring, PA-C  ibuprofen (ADVIL) 800 MG tablet Take 1 tablet (800 mg total) by mouth 3 (three) times daily. 09/24/22  Yes Sherrill Raring, PA-C  albuterol (PROVENTIL) (2.5 MG/3ML) 0.083% nebulizer solution Take 3 mLs (2.5 mg total) by nebulization every 4 (four) hours as needed for wheezing or shortness of breath. 06/22/19   Hedges, Dellis Filbert, PA-C  albuterol (VENTOLIN HFA) 108 (90 Base) MCG/ACT inhaler Inhale 2 puffs into the lungs every 6 (six) hours as needed for wheezing or shortness of breath. 06/22/19   Hedges, Dellis Filbert, PA-C  augmented betamethasone dipropionate (DIPROLENE-AF) 0.05 % ointment Apply topically. 06/07/21   [provider]  betamethasone dipropionate 0.05 % cream Apply topically. 07/02/22   [provider]  cetirizine (ZYRTEC) 10 MG tablet Take 1 tablet by mouth daily. 06/06/13   [provider]  desonide (DESOWEN) 0.05 % cream Apply 1-2 times a day on the face and armpits. 09/26/20   [provider]  dicyclomine (BENTYL) 20 MG tablet Take 1 tablet (20 mg total) by mouth 2 (two) times daily. 12/27/21   Evlyn Courier, PA-C  EPINEPHrine 0.3 mg/0.3 mL IJ SOAJ injection Inject 0.3 mLs (0.3 mg total) into the muscle as needed for anaphylaxis. 04/25/20    Panosh, Standley Brooking, MD  fluticasone (FLONASE) 50 MCG/ACT nasal spray Place 2 sprays into both nostrils daily. 10/16/21   Blanchie Dessert, MD  fluticasone-salmeterol (ADVAIR HFA) 230-21 MCG/ACT inhaler Inhale 2 puffs into the lungs 2 (two) times daily. 08/31/18   Brand Males, MD  hydrOXYzine (ATARAX/VISTARIL) 25 MG tablet Take 1-2 tablets (25-50 mg total) by mouth every 6 (six) hours as needed for itching. 04/25/20   Panosh, Standley Brooking, MD  ipratropium (ATROVENT) 0.02 % nebulizer solution USE 1 VIAL BY NEBULIZATION 3 (THREE) TIMES DAILY AS NEEDED FOR WHEEZING. **DUE FOR ANNUAL VISIT June 2019** 06/22/19   Hedges, Dellis Filbert, PA-C  levocetirizine (XYZAL ALLERGY 24HR) 5 MG tablet Take 1 tablet (5 mg total) by mouth every evening. 02/18/17   Nafziger, [name], NP  loratadine (CLARITIN) 10 MG tablet Take 1 tablet (10 mg total) by mouth daily. 10/16/21   Blanchie Dessert, MD  metFORMIN (GLUCOPHAGE-XR) 500 MG 24 hr tablet Take 1 tablet (500 mg total) by mouth daily with breakfast. 10/19/18 11/27/28  Panosh, Standley Brooking, MD  methylphenidate 36 MG PO CR tablet TAKE (2) TABLETS BY MOUTH DAILY. Patient taking differently: Take 72 mg by mouth daily. 01/19/19   Panosh, Standley Brooking, MD  montelukast (SINGULAIR) 10 MG tablet Take 1 tablet by mouth daily. 01/23/14   [provider]  norgestimate-ethinyl estradiol (ORTHO-CYCLEN) 0.25-35 MG-MCG tablet Take 1 tablet by mouth daily. 01/23/22   Laury Deep, CNM  ondansetron (ZOFRAN-ODT) 4 MG disintegrating tablet Take 1 tablet (4 mg total) by mouth every  8 (eight) hours as needed for nausea or vomiting. 12/27/21   Deatra Canter, Amjad, PA-C  pantoprazole (PROTONIX) 40 MG tablet Take 1 tablet (40 mg total) by mouth daily. 06/12/22   Azucena Cecil, PA-C  sucralfate (CARAFATE) 1 g tablet Take 1 tablet (1 g total) by mouth 4 (four) times daily -  with meals and at bedtime for 10 days. 12/05/20 12/15/20  Curatolo, Adam, DO  VITAMIN D PO Take by mouth. Takes the gummies    [provider]   famotidine (PEPCID) 20 MG tablet Take 1 tablet (20 mg total) by mouth 2 (two) times daily. Take twice daily for 3 days, then as needed if symptoms recur Patient not taking: Reported on 11/28/2019 10/27/19 11/28/19  Charlann Lange, PA-C      Allergies    Patient has no known allergies.    Review of Systems   Review of Systems  Physical Exam Updated Vital Signs BP 127/74 (BP Location: Left Arm)   Pulse 92   Temp 98.3 F (36.8 C) (Oral)   Resp 18   SpO2 96%  Physical Exam Vitals and nursing note reviewed. Exam conducted with a chaperone present.  Constitutional:      Appearance: Normal appearance.  HENT:     Head: Normocephalic.     Mouth/Throat:     Mouth: Mucous membranes are moist.     Pharynx: No oropharyngeal exudate or posterior oropharyngeal erythema.     Comments: No sublingual tenderness or swelling. Uvula is midline. No trismus. No dry sockets or pulp exposure. Handling secretions without difficulty.  Tenderness to percussion over tooth 17 with some mild swelling no appreciable fluctuance.  Eyes:     Extraocular Movements: Extraocular movements intact.     Pupils: Pupils are equal, round, and reactive to light.  Cardiovascular:     Rate and Rhythm: Normal rate and regular rhythm.  Pulmonary:     Effort: Pulmonary effort is normal.     Breath sounds: Normal breath sounds.  Musculoskeletal:     Cervical back: Normal range of motion. No rigidity or tenderness.  Neurological:     Mental Status: She is alert.  Psychiatric:        Mood and Affect: Mood normal.     ED Results / Procedures / Treatments   Labs (all labs ordered are listed, but only abnormal results are displayed) Labs Reviewed - No data to display  EKG None  Radiology No results found.  Procedures Procedures    Medications Ordered in ED Medications - No data to display  ED Course/ Medical Decision Making/ A&P                           Medical Decision Making Risk Prescription drug  management.   This is a 31 year old female presenting with left sided jaw pain. On exam she has reproducible tenderness with percussion of tooth 17 with some mild erythema, no fluctuance and not amenable for I&D.  No signs of Ludwig angina, peritonsillar abscess, retropharyngeal abscess.  There is no significant facial swelling and normal phonation without any airway compromise.  We will send her home with some antibiotics, anti-inflammatories and have her follow-up with a dentist.        Final Clinical Impression(s) / ED Diagnoses Final diagnoses:  Dental abscess  Pain, dental    Rx / DC Orders ED Discharge Orders          Ordered  amoxicillin-clavulanate (AUGMENTIN) 875-125 MG tablet  Every 12 hours        09/24/22 1709    ibuprofen (ADVIL) 800 MG tablet  3 times daily        09/24/22 1709              Sherrill Raring, PA-C 09/24/22 1744    Ezequiel Essex, MD 09/24/22 2228

## 2022-09-24 NOTE — ED Triage Notes (Signed)
Pt arrived via POV, c/o left sided jaw swelling and pain since this morning. Denies any injury.

## 2022-11-19 ENCOUNTER — Ambulatory Visit (INDEPENDENT_AMBULATORY_CARE_PROVIDER_SITE_OTHER): Payer: 59

## 2022-11-19 ENCOUNTER — Ambulatory Visit (INDEPENDENT_AMBULATORY_CARE_PROVIDER_SITE_OTHER): Payer: 59 | Admitting: Family

## 2022-11-19 ENCOUNTER — Encounter: Payer: Self-pay | Admitting: Family

## 2022-11-19 ENCOUNTER — Telehealth: Payer: Self-pay | Admitting: Family

## 2022-11-19 DIAGNOSIS — M25572 Pain in left ankle and joints of left foot: Secondary | ICD-10-CM

## 2022-11-19 DIAGNOSIS — S93492A Sprain of other ligament of left ankle, initial encounter: Secondary | ICD-10-CM

## 2022-11-19 DIAGNOSIS — G8929 Other chronic pain: Secondary | ICD-10-CM

## 2022-11-19 NOTE — Progress Notes (Signed)
Office Visit Note   Patient: Glenda Mendez           Date of Birth: Sep 22, 1991           MRN: [id number] Visit Date: 11/19/2022              Requested by: Burnis Medin, MD [place],  [place] [address] PCP: Burnis Medin, MD  Chief Complaint  Patient presents with   Left Ankle - Pain      HPI: The patient is a 32 year old woman who presents today complaining of left ankle pain and swelling she states that she had a fall out of bed about a week ago and twisted her left ankle she initially had bruising and significant swelling she has had pain with weightbearing since pain to the lateral ankle and across the front of her ankle  She has had prior arthroscopy on the same ankle  Assessment & Plan: Visit Diagnoses: No diagnosis found.  Plan: ATFL sprain on the left.  Placed in an ASO.  Will follow-up in 3 weeks discussed range of motion and typical course.  Follow-Up Instructions: No follow-ups on file.   Left Ankle Exam   Tenderness  The patient is experiencing tenderness in the ATF and CF.  Swelling: mild  Tests  Anterior drawer: negative  Other  Erythema: absent Pulse: present  Comments:  No ecchymosis today      Patient is alert, oriented, no adenopathy, well-dressed, normal affect, normal respiratory effort.   Imaging: No results found. No images are attached to the encounter.  Labs: Lab Results  Component Value Date   HGBA1C 5.9 (A) 08/04/2022   HGBA1C 6.4 04/02/2022   HGBA1C 5.7 (A) 11/26/2021   ESRSEDRATE 17 07/03/2015   CRP 0.7 12/04/2019   REPTSTATUS 12/07/2020 FINAL 12/05/2020   CULT >=100,000 COLONIES/mL KLEBSIELLA PNEUMONIAE (A) 12/05/2020   LABORGA KLEBSIELLA PNEUMONIAE (A) 12/05/2020     Lab Results  Component Value Date   ALBUMIN 3.6 07/26/2022   ALBUMIN 4.5 04/02/2022   ALBUMIN 3.5 12/27/2021    No results found for: "MG" Lab Results  Component Value Date   VD25OH 18.20 (L) 04/25/2020   VD25OH 8.08  (L) 08/02/2018    No results found for: "PREALBUMIN"    Latest Ref Rng & Units 07/26/2022   12:58 AM 07/26/2022   12:52 AM 06/12/2022    6:35 AM  CBC EXTENDED  WBC 4.0 - 10.5 K/uL  6.7  5.3   RBC 3.87 - 5.11 MIL/uL  5.12  5.42   Hemoglobin 12.0 - 15.0 g/dL 12.6  11.2  11.6   HCT 36.0 - 46.0 % 37.0  36.0  38.1   Platelets 150 - 400 K/uL  399  366   NEUT# 1.7 - 7.7 K/uL  4.0    Lymph# 0.7 - 4.0 K/uL  1.8       There is no height or weight on file to calculate BMI.  Orders:  No orders of the defined types were placed in this encounter.  No orders of the defined types were placed in this encounter.    Procedures: No procedures performed  Clinical Data: No additional findings.  ROS:  All other systems negative, except as noted in the HPI. Review of Systems  Objective: Vital Signs: There were no vitals taken for this visit.  Specialty Comments:  No specialty comments available.  PMFS History: Patient Active Problem List   Diagnosis Date Noted   Impingement of  left ankle joint 12/02/2017   Left ankle pain 06/23/2017   Ganglion cyst of flexor tendon sheath of finger of right hand 02/02/2017   Trigger finger, right middle finger 02/02/2017   Eczema 07/11/2016   Dyspnea and respiratory abnormality 05/08/2016   Elevated testosterone level in female 08/28/2015   Elevated hemoglobin A1c 08/28/2015   Abdominal pain, right upper quadrant 06/05/2015   Abnormal urine 06/05/2015   Alkaline phosphatase elevation 04/27/2015   Severe obesity (BMI >= 40) (HCC) 12/19/2014   Asthma with acute exacerbation 08/29/2014   Moderate intermittent asthma, uncomplicated 16/07/9603   Rash, skin 06/20/2014   Moderate persistent asthma with acute exacerbation in adult 05/27/2014   Anterior chest wall pain 05/27/2014   Low grade squamous intraepithelial lesion (LGSIL) on cervical Pap smear 12/02/2013   Obesity (BMI 30-39.9) 11/28/2013   Visit for preventive health examination 11/28/2013    Encounter for routine gynecological examination 11/28/2013   Weight gain 11/28/2013   Asthma 11/28/2013   Moderate persistent asthma without complication 54/06/8118   Hemiparesis ([place])    Cystic acne 01/31/2013   Oral contraceptive pill surveillance 04/25/2012   Developmental delay 03/03/2012   GERD (gastroesophageal reflux disease) 03/03/2012   Premature birth 03/03/2012   Headache 06/04/2011   Irregular periods 06/04/2011   Acne 06/04/2011   BCP (birth control pills) initiation 06/04/2011   Preventative health care 02/18/2011   Neoplasm of uncertain behavior of skin 02/18/2011   BMI (body mass index), pediatric, 95-99% for age 67/10/2010   Lumbar back pain 09/17/2010   NECK PAIN 01/02/2009   FATIGUE 12/06/2008   Attention deficit hyperactivity disorder (ADHD) 09/03/2007   UNSPECIFIED HEARING LOSS 09/03/2007   ALLERGIC RHINITIS 05/19/2007   ASTHMA 05/19/2007   Past Medical History:  Diagnosis Date   ADHD (attention deficit hyperactivity disorder)    Allergy    Asthma    BMI (body mass index), pediatric, 95-99% for age 43/10/2010   Is starting to gain weight again and advised and counseled today about reduction for health risk reasons. Patient is very well aware of how to do lifestyle intervention she has done this before.    Hearing loss in right ear    with hearing aid bilateral    Hemiparesis (HCC)    rt from neonatal period   Hypoxia of newborn    Irregular periods 06/04/2011   PCOS (polycystic ovarian syndrome)    Pulmonary hemorrhage of fetus or newborn    9 week C-section [facility] of with birth hypoxia    Family History  Adopted: Yes  Problem Relation Age of Onset   Hypertension Other    Schizophrenia Mother        with alcohol drug abuse   Diabetes Other     Past Surgical History:  Procedure Laterality Date   CANNULATION FOR ECMO (EXTRACORPOREAL MEMBRANE OXYGENATION)  May 01, 1992   TONSILLECTOMY     Social History   Occupational History   Not on file   Tobacco Use   Smoking status: Never    Passive exposure: Never   Smokeless tobacco: Never  Vaping Use   Vaping Use: Never used  Substance and Sexual Activity   Alcohol use: No    Alcohol/week: 0.0 standard drinks of alcohol   Drug use: No   Sexual activity: Yes    Partners: Male    Birth control/protection: None

## 2022-11-19 NOTE — Telephone Encounter (Signed)
Pt was seen in office today and needed a work note to excuse her missed time.  Letter given to patient.

## 2022-11-25 ENCOUNTER — Ambulatory Visit: Payer: Medicaid Other | Admitting: Internal Medicine

## 2022-11-30 ENCOUNTER — Other Ambulatory Visit: Payer: Self-pay

## 2022-11-30 DIAGNOSIS — J45909 Unspecified asthma, uncomplicated: Secondary | ICD-10-CM | POA: Insufficient documentation

## 2022-11-30 DIAGNOSIS — L02511 Cutaneous abscess of right hand: Secondary | ICD-10-CM | POA: Diagnosis not present

## 2022-11-30 DIAGNOSIS — Z7951 Long term (current) use of inhaled steroids: Secondary | ICD-10-CM | POA: Insufficient documentation

## 2022-11-30 DIAGNOSIS — M7989 Other specified soft tissue disorders: Secondary | ICD-10-CM | POA: Diagnosis present

## 2022-11-30 NOTE — ED Triage Notes (Signed)
Pt via pov from home with red and swollen 4th finger on her right hand. She reports that she saw it 2 days ago and thought it would go away, but instead it got worse. Pt alert & oriented, nad noted.

## 2022-12-01 ENCOUNTER — Emergency Department (HOSPITAL_BASED_OUTPATIENT_CLINIC_OR_DEPARTMENT_OTHER)
Admission: EM | Admit: 2022-12-01 | Discharge: 2022-12-01 | Disposition: A | Discharge status: Home / Self Care | Payer: 59 | Attending: Emergency Medicine | Admitting: Emergency Medicine

## 2022-12-01 DIAGNOSIS — L02511 Cutaneous abscess of right hand: Secondary | ICD-10-CM

## 2022-12-01 MED ORDER — DOXYCYCLINE HYCLATE 100 MG PO TABS
100.0000 mg | ORAL_TABLET | Freq: Once | ORAL | Status: AC
  Administered 2022-12-01: 100 mg via ORAL
  Filled 2022-12-01: qty 1

## 2022-12-01 MED ORDER — DOXYCYCLINE HYCLATE 100 MG PO CAPS: 100.0000 mg | ORAL_CAPSULE | Freq: Two times a day (BID) | ORAL | 0 refills | Status: DC

## 2022-12-01 NOTE — Discharge Instructions (Addendum)
You were seen today and found to have an infection of your right finger.  He also had a small abscess.  This was drained.  Make sure to soak in warm soapy water twice daily.  Take antibiotics as prescribed.  If you note streaking redness, increasing pain, increasing swelling, you should be reevaluated.

## 2022-12-01 NOTE — ED Notes (Signed)
Patient verbalized understanding of discharge instructions and reasons to return to the ED 

## 2022-12-01 NOTE — ED Provider Notes (Signed)
[facility] Provider Note   CSN: [id number] Arrival date & time: 11/30/22  2126     History  Chief Complaint  Patient presents with   Rash    Glenda Mendez is a 32 y.o. female.  HPI     This is a 32 year old female who presents with possible infection of the right fourth digit.  Patient reports she noted a bump on the dorsum of her right fourth digit 2 days ago.  It has become more swollen.  She has noted some drainage.  She also noted some redness.  She is right-handed.  No other fevers or systemic symptoms.  Home Medications Prior to Admission medications   Medication Sig Start Date End Date Taking? Authorizing Provider  doxycycline (VIBRAMYCIN) 100 MG capsule Take 1 capsule (100 mg total) by mouth 2 (two) times daily. 12/01/22  Yes Marykatherine Sherwood, Barbette Hair, MD  albuterol (PROVENTIL) (2.5 MG/3ML) 0.083% nebulizer solution Take 3 mLs (2.5 mg total) by nebulization every 4 (four) hours as needed for wheezing or shortness of breath. 06/22/19   Hedges, Dellis Filbert, PA-C  albuterol (VENTOLIN HFA) 108 (90 Base) MCG/ACT inhaler Inhale 2 puffs into the lungs every 6 (six) hours as needed for wheezing or shortness of breath. 06/22/19   Hedges, Dellis Filbert, PA-C  amoxicillin-clavulanate (AUGMENTIN) 875-125 MG tablet Take 1 tablet by mouth every 12 (twelve) hours. 09/24/22   Sherrill Raring, PA-C  augmented betamethasone dipropionate (DIPROLENE-AF) 0.05 % ointment Apply topically. 06/07/21   [provider]  betamethasone dipropionate 0.05 % cream Apply topically. 07/02/22   [provider]  cetirizine (ZYRTEC) 10 MG tablet Take 1 tablet by mouth daily. 06/06/13   [provider]  desonide (DESOWEN) 0.05 % cream Apply 1-2 times a day on the face and armpits. 09/26/20   [provider]  dicyclomine (BENTYL) 20 MG tablet Take 1 tablet (20 mg total) by mouth 2 (two) times daily. 12/27/21   Evlyn Courier, PA-C  EPINEPHrine 0.3 mg/0.3 mL IJ SOAJ  injection Inject 0.3 mLs (0.3 mg total) into the muscle as needed for anaphylaxis. 04/25/20   Panosh, Standley Brooking, MD  fluticasone (FLONASE) 50 MCG/ACT nasal spray Place 2 sprays into both nostrils daily. 10/16/21   Blanchie Dessert, MD  fluticasone-salmeterol (ADVAIR HFA) 230-21 MCG/ACT inhaler Inhale 2 puffs into the lungs 2 (two) times daily. 08/31/18   Brand Males, MD  hydrOXYzine (ATARAX/VISTARIL) 25 MG tablet Take 1-2 tablets (25-50 mg total) by mouth every 6 (six) hours as needed for itching. 04/25/20   Panosh, Standley Brooking, MD  ibuprofen (ADVIL) 800 MG tablet Take 1 tablet (800 mg total) by mouth 3 (three) times daily. 09/24/22   Sherrill Raring, PA-C  ipratropium (ATROVENT) 0.02 % nebulizer solution USE 1 VIAL BY NEBULIZATION 3 (THREE) TIMES DAILY AS NEEDED FOR WHEEZING. **DUE FOR ANNUAL VISIT June 2019** 06/22/19   Hedges, Dellis Filbert, PA-C  levocetirizine (XYZAL ALLERGY 24HR) 5 MG tablet Take 1 tablet (5 mg total) by mouth every evening. 02/18/17   Nafziger, [name], NP  loratadine (CLARITIN) 10 MG tablet Take 1 tablet (10 mg total) by mouth daily. 10/16/21   Blanchie Dessert, MD  metFORMIN (GLUCOPHAGE-XR) 500 MG 24 hr tablet Take 1 tablet (500 mg total) by mouth daily with breakfast. 10/19/18 11/27/28  Panosh, Standley Brooking, MD  methylphenidate 36 MG PO CR tablet TAKE (2) TABLETS BY MOUTH DAILY. Patient taking differently: Take 72 mg by mouth daily. 01/19/19   Panosh, Standley Brooking, MD  montelukast (SINGULAIR) 10  MG tablet Take 1 tablet by mouth daily. 01/23/14   [provider]  norgestimate-ethinyl estradiol (ORTHO-CYCLEN) 0.25-35 MG-MCG tablet Take 1 tablet by mouth daily. 01/23/22   Laury Deep, CNM  ondansetron (ZOFRAN-ODT) 4 MG disintegrating tablet Take 1 tablet (4 mg total) by mouth every 8 (eight) hours as needed for nausea or vomiting. 12/27/21   Deatra Canter, Amjad, PA-C  pantoprazole (PROTONIX) 40 MG tablet Take 1 tablet (40 mg total) by mouth daily. 06/12/22   Azucena Cecil, PA-C  sucralfate (CARAFATE) 1 g  tablet Take 1 tablet (1 g total) by mouth 4 (four) times daily -  with meals and at bedtime for 10 days. 12/05/20 12/15/20  Curatolo, Adam, DO  VITAMIN D PO Take by mouth. Takes the gummies    [provider]  famotidine (PEPCID) 20 MG tablet Take 1 tablet (20 mg total) by mouth 2 (two) times daily. Take twice daily for 3 days, then as needed if symptoms recur Patient not taking: Reported on 11/28/2019 10/27/19 11/28/19  Charlann Lange, PA-C      Allergies    Patient has no known allergies.    Review of Systems   Review of Systems  Constitutional:  Negative for fever.  Skin:  Positive for color change.  All other systems reviewed and are negative.   Physical Exam Updated Vital Signs BP 101/69   Pulse 75   Temp 98.2 F (36.8 C) (Oral)   Resp 20   Ht 1.715 m (5' 7.5")   Wt 127 kg   LMP 11/28/2022 (Approximate)   SpO2 94%   BMI 43.21 kg/m  Physical Exam Vitals and nursing note reviewed.  Constitutional:      Appearance: She is well-developed. She is obese. She is not ill-appearing.  HENT:     Head: Normocephalic and atraumatic.     Mouth/Throat:     Mouth: Mucous membranes are moist.  Eyes:     Pupils: Pupils are equal, round, and reactive to light.  Cardiovascular:     Rate and Rhythm: Normal rate and regular rhythm.  Pulmonary:     Effort: Pulmonary effort is normal. No respiratory distress.  Abdominal:     Palpations: Abdomen is soft.  Musculoskeletal:     Cervical back: Neck supple.     Comments: Normal range of motion of all 5 fingers on the right hand, flexion and extension intact, no fusiform swelling  Skin:    General: Skin is warm and dry.     Comments: Pustule noted over the dorsum of the right fourth digit just proximal to the PIP joint, fluctuance noted, slight erythema adjacent, no streaking erythema  Neurological:     Mental Status: She is alert and oriented to person, place, and time.  Psychiatric:        Mood and Affect: Mood normal.     ED  Results / Procedures / Treatments   Labs (all labs ordered are listed, but only abnormal results are displayed) Labs Reviewed - No data to display  EKG None  Radiology No results found.  Procedures .Marland KitchenIncision and Drainage  Date/Time: 12/01/2022 12:46 AM  Performed by: Merryl Hacker, MD Authorized by: Merryl Hacker, MD   Consent:    Consent obtained:  Verbal   Consent given by:  Patient   Risks discussed:  Bleeding, incomplete drainage, pain and infection Location:    Type:  Abscess   Size:  <.5 cm   Location:  Upper extremity   Upper extremity location:  Finger   Finger location:  R ring finger Pre-procedure details:    Skin preparation:  Chlorhexidine Sedation:    Sedation type:  None Anesthesia:    Anesthesia method:  None Procedure type:    Complexity:  Simple Procedure details:    Ultrasound guidance: no     Needle aspiration: yes     Needle gauge: 23 G.   Drainage:  Purulent   Drainage amount:  Scant Comments:     Stool unroofed with 23-gauge syringe, purulent drainage expressed     Medications Ordered in ED Medications  doxycycline (VIBRA-TABS) tablet 100 mg (has no administration in time range)    ED Course/ Medical Decision Making/ A&P                             Medical Decision Making Risk Prescription drug management.   This patient presents to the ED for concern of finger infection, this involves an extensive number of treatment options, and is a complaint that carries with it a high risk of complications and morbidity.  I considered the following differential and admission for this acute, potentially life threatening condition.  The differential diagnosis includes abscess, cellulitis, tenosynovitis  MDM:    This is a 32 year old female who presents with a concern for infection of the right fourth digit.  She is nontoxic and vital signs are reassuring.  Flexion extension of all 5 digits is intact.  She has a small pustule on the  dorsum of the finger.  Does not appear to extend proximally or distally.  Pustule was unroofed with the expression of purulent drainage.  Likely small abscess.  Given adjacent erythema, early cellulitis is also likely.  Recommend warm soaks and antibiotics.  Do not feel that this is consistent with tenosynovitis.  Patient was given strict return precautions.  (Labs, imaging, consults)  Labs: I Ordered, and personally interpreted labs.  The pertinent results include: None  Imaging Studies ordered: I ordered imaging studies including none I independently visualized and interpreted imaging. I agree with the radiologist interpretation  Additional history obtained from chart review.  External records from outside source obtained and reviewed including evaluations  Cardiac Monitoring: The patient was not maintained on a cardiac monitor.  If on the cardiac monitor, I personally viewed and interpreted the cardiac monitored which showed an underlying rhythm of: N/A  Reevaluation: After the interventions noted above, I reevaluated the patient and found that they have :improved  Social Determinants of Health:  lives independently  Disposition: Discharge  Co morbidities that complicate the patient evaluation  Past Medical History:  Diagnosis Date   ADHD (attention deficit hyperactivity disorder)    Allergy    Asthma    BMI (body mass index), pediatric, 95-99% for age 21/10/2010   Is starting to gain weight again and advised and counseled today about reduction for health risk reasons. Patient is very well aware of how to do lifestyle intervention she has done this before.    Hearing loss in right ear    with hearing aid bilateral    Hemiparesis (HCC)    rt from neonatal period   Hypoxia of newborn    Irregular periods 06/04/2011   PCOS (polycystic ovarian syndrome)    Pulmonary hemorrhage of fetus or newborn    23 week C-section [facility] of with birth hypoxia     Medicines Meds ordered this  encounter  Medications   doxycycline (VIBRA-TABS) tablet 100 mg  doxycycline (VIBRAMYCIN) 100 MG capsule    Sig: Take 1 capsule (100 mg total) by mouth 2 (two) times daily.    Dispense:  14 capsule    Refill:  0    I have reviewed the patients home medicines and have made adjustments as needed  Problem List / ED Course: Problem List Items Addressed This Visit   None Visit Diagnoses     Abscess of finger of right hand    -  Primary                   Final Clinical Impression(s) / ED Diagnoses Final diagnoses:  Abscess of finger of right hand    Rx / DC Orders ED Discharge Orders          Ordered    doxycycline (VIBRAMYCIN) 100 MG capsule  2 times daily        12/01/22 0044              Karin Pinedo, Barbette Hair, MD 12/01/22 [phone]

## 2023-03-10 DIAGNOSIS — F909 Attention-deficit hyperactivity disorder, unspecified type: Secondary | ICD-10-CM | POA: Diagnosis not present

## 2023-03-10 DIAGNOSIS — J4540 Moderate persistent asthma, uncomplicated: Secondary | ICD-10-CM | POA: Diagnosis not present

## 2023-03-10 DIAGNOSIS — Z3041 Encounter for surveillance of contraceptive pills: Secondary | ICD-10-CM | POA: Diagnosis not present

## 2023-03-10 DIAGNOSIS — L700 Acne vulgaris: Secondary | ICD-10-CM | POA: Diagnosis not present

## 2023-03-10 DIAGNOSIS — R7303 Prediabetes: Secondary | ICD-10-CM | POA: Diagnosis not present

## 2023-03-10 DIAGNOSIS — G8191 Hemiplegia, unspecified affecting right dominant side: Secondary | ICD-10-CM | POA: Diagnosis not present

## 2023-03-10 DIAGNOSIS — E282 Polycystic ovarian syndrome: Secondary | ICD-10-CM | POA: Diagnosis not present

## 2023-03-10 DIAGNOSIS — R519 Headache, unspecified: Secondary | ICD-10-CM | POA: Diagnosis not present

## 2023-03-28 ENCOUNTER — Emergency Department (HOSPITAL_BASED_OUTPATIENT_CLINIC_OR_DEPARTMENT_OTHER)
Admission: EM | Admit: 2023-03-28 | Discharge: 2023-03-28 | Disposition: A | Discharge status: Home / Self Care | Payer: 59 | Attending: Emergency Medicine | Admitting: Emergency Medicine

## 2023-03-28 ENCOUNTER — Other Ambulatory Visit: Payer: Self-pay

## 2023-03-28 ENCOUNTER — Emergency Department (HOSPITAL_BASED_OUTPATIENT_CLINIC_OR_DEPARTMENT_OTHER): Payer: 59

## 2023-03-28 DIAGNOSIS — R350 Frequency of micturition: Secondary | ICD-10-CM | POA: Diagnosis not present

## 2023-03-28 DIAGNOSIS — J45909 Unspecified asthma, uncomplicated: Secondary | ICD-10-CM | POA: Insufficient documentation

## 2023-03-28 DIAGNOSIS — N3001 Acute cystitis with hematuria: Secondary | ICD-10-CM

## 2023-03-28 LAB — URINALYSIS, ROUTINE W REFLEX MICROSCOPIC
Bacteria, UA: NONE SEEN
Bilirubin Urine: NEGATIVE
Glucose, UA: NEGATIVE mg/dL
Ketones, ur: NEGATIVE mg/dL
Nitrite: NEGATIVE
Protein, ur: 30 mg/dL — AB
RBC / HPF: >50 RBC/hpf (ref 0–5)
Specific Gravity, Urine: 1.029 (ref 1.005–1.030)
WBC, UA: >50 WBC/hpf (ref 0–5)
pH: 5.5 (ref 5.0–8.0)

## 2023-03-28 LAB — COMPREHENSIVE METABOLIC PANEL
ALT: 18 U/L (ref 0–44)
AST: 16 U/L (ref 15–41)
Albumin: 4.3 g/dL (ref 3.5–5.0)
Alkaline Phosphatase: 103 U/L (ref 38–126)
Anion gap: 10 (ref 5–15)
BUN: 10 mg/dL (ref 6–20)
CO2: 24 mmol/L (ref 22–32)
Calcium: 9.2 mg/dL (ref 8.9–10.3)
Chloride: 106 mmol/L (ref 98–111)
Creatinine, Ser: 0.92 mg/dL (ref 0.44–1.00)
GFR, Estimated: >60 mL/min (ref >60)
Glucose, Bld: 85 mg/dL (ref 70–99)
Potassium: 3.7 mmol/L (ref 3.5–5.1)
Sodium: 140 mmol/L (ref 135–145)
Total Bilirubin: 0.3 mg/dL (ref 0.3–1.2)
Total Protein: 7.8 g/dL (ref 6.5–8.1)

## 2023-03-28 LAB — CBC WITH DIFFERENTIAL/PLATELET
Abs Immature Granulocytes: 0.01 10*3/uL (ref 0.00–0.07)
Basophils Absolute: 0 10*3/uL (ref 0.0–0.1)
Basophils Relative: 0 %
Eosinophils Absolute: 0.2 10*3/uL (ref 0.0–0.5)
Eosinophils Relative: 3 %
HCT: 37.7 % (ref 36.0–46.0)
Hemoglobin: 11.5 g/dL — ABNORMAL LOW (ref 12.0–15.0)
Immature Granulocytes: 0 %
Lymphocytes Relative: 37 %
Lymphs Abs: 2.5 10*3/uL (ref 0.7–4.0)
MCH: 21 pg — ABNORMAL LOW (ref 26.0–34.0)
MCHC: 30.5 g/dL (ref 30.0–36.0)
MCV: 68.9 fL — ABNORMAL LOW (ref 80.0–100.0)
Monocytes Absolute: 0.5 10*3/uL (ref 0.1–1.0)
Monocytes Relative: 8 %
Neutro Abs: 3.5 10*3/uL (ref 1.7–7.7)
Neutrophils Relative %: 52 %
Platelets: 403 10*3/uL — ABNORMAL HIGH (ref 150–400)
RBC: 5.47 MIL/uL — ABNORMAL HIGH (ref 3.87–5.11)
RDW: 17.2 % — ABNORMAL HIGH (ref 11.5–15.5)
WBC: 6.7 10*3/uL (ref 4.0–10.5)
nRBC: 0 % (ref 0.0–0.2)

## 2023-03-28 LAB — PREGNANCY, URINE: Preg Test, Ur: NEGATIVE

## 2023-03-28 LAB — LIPASE, BLOOD: Lipase: 42 U/L (ref 11–51)

## 2023-03-28 MED ORDER — CEPHALEXIN 250 MG PO CAPS
500.0000 mg | ORAL_CAPSULE | Freq: Once | ORAL | Status: AC
  Administered 2023-03-28: 500 mg via ORAL
  Filled 2023-03-28: qty 2

## 2023-03-28 MED ORDER — FLUCONAZOLE 150 MG PO TABS: 150.0000 mg | ORAL_TABLET | Freq: Every day | ORAL | 0 refills | Status: DC

## 2023-03-28 MED ORDER — PHENAZOPYRIDINE HCL 100 MG PO TABS
95.0000 mg | ORAL_TABLET | Freq: Once | ORAL | Status: AC
  Administered 2023-03-28: 100 mg via ORAL
  Filled 2023-03-28: qty 1

## 2023-03-28 MED ORDER — CEPHALEXIN 500 MG PO CAPS: 500.0000 mg | ORAL_CAPSULE | Freq: Three times a day (TID) | ORAL | 0 refills | Status: DC

## 2023-03-28 NOTE — Discharge Instructions (Addendum)
Take the antibiotics as prescribed for urinary tract infection.  You also have yeast in your urine and were given a pill to treat that.  Follow-up with your doctor for recheck this week.  Return to the ED with new or worsening symptoms.

## 2023-03-28 NOTE — ED Notes (Signed)
RN reviewed discharge instructions with pt. Pt verbalized understanding and had no further questions. VSS upon discharge.  

## 2023-03-28 NOTE — ED Notes (Signed)
Patient transported to CT 

## 2023-03-28 NOTE — ED Provider Notes (Signed)
EMERGENCY DEPARTMENT AT [facility] Provider Note   CSN: [id number] Arrival date & time: 03/28/23  0024     History  Chief Complaint  Patient presents with   Abdominal Pain    Glenda Mendez is a 32 y.o. female.  Patient with a history of PCOS, prediabetes, asthma, eczema as a baby presenting with urinary frequency, urgency and lower abdominal pain since 9 PM tonight.  Describes constant suprapubic pain, urinary frequency with going to the bathroom frequently and urinating small amounts.  Describes burning with urination.  Denies any blood in the urine.  Denies any flank pain.  Denies any fevers, nausea, vomiting, diarrhea or constipation.  No chest pain or shortness of breath.  No abnormal vaginal bleeding or discharge.  No significant right lower quadrant pain.  Still has appendix and gallbladder.  No fever.  No abnormal vaginal bleeding or discharge.  The history is provided by the patient.  Abdominal Pain Associated symptoms: dysuria and hematuria   Associated symptoms: no cough, no fever, no nausea, no shortness of breath and no vomiting        Home Medications Prior to Admission medications   Medication Sig Start Date End Date Taking? Authorizing Provider  albuterol (PROVENTIL) (2.5 MG/3ML) 0.083% nebulizer solution Take 3 mLs (2.5 mg total) by nebulization every 4 (four) hours as needed for wheezing or shortness of breath. 06/22/19   Hedges, Tinnie Gens, PA-C  albuterol (VENTOLIN HFA) 108 (90 Base) MCG/ACT inhaler Inhale 2 puffs into the lungs every 6 (six) hours as needed for wheezing or shortness of breath. 06/22/19   Hedges, Tinnie Gens, PA-C  amoxicillin-clavulanate (AUGMENTIN) 875-125 MG tablet Take 1 tablet by mouth every 12 (twelve) hours. 09/24/22   Theron Arista, PA-C  augmented betamethasone dipropionate (DIPROLENE-AF) 0.05 % ointment Apply topically. 06/07/21   [provider]  betamethasone dipropionate 0.05 % cream Apply topically. 07/02/22    [provider]  cetirizine (ZYRTEC) 10 MG tablet Take 1 tablet by mouth daily. 06/06/13   [provider]  desonide (DESOWEN) 0.05 % cream Apply 1-2 times a day on the face and armpits. 09/26/20   [provider]  dicyclomine (BENTYL) 20 MG tablet Take 1 tablet (20 mg total) by mouth 2 (two) times daily. 12/27/21   Marita Kansas, PA-C  doxycycline (VIBRAMYCIN) 100 MG capsule Take 1 capsule (100 mg total) by mouth 2 (two) times daily. 12/01/22   Horton, Mayer Masker, MD  EPINEPHrine 0.3 mg/0.3 mL IJ SOAJ injection Inject 0.3 mLs (0.3 mg total) into the muscle as needed for anaphylaxis. 04/25/20   Panosh, Neta Mends, MD  fluticasone (FLONASE) 50 MCG/ACT nasal spray Place 2 sprays into both nostrils daily. 10/16/21   Gwyneth Sprout, MD  fluticasone-salmeterol (ADVAIR HFA) 230-21 MCG/ACT inhaler Inhale 2 puffs into the lungs 2 (two) times daily. 08/31/18   Kalman Shan, MD  hydrOXYzine (ATARAX/VISTARIL) 25 MG tablet Take 1-2 tablets (25-50 mg total) by mouth every 6 (six) hours as needed for itching. 04/25/20   Panosh, Neta Mends, MD  ibuprofen (ADVIL) 800 MG tablet Take 1 tablet (800 mg total) by mouth 3 (three) times daily. 09/24/22   Theron Arista, PA-C  ipratropium (ATROVENT) 0.02 % nebulizer solution USE 1 VIAL BY NEBULIZATION 3 (THREE) TIMES DAILY AS NEEDED FOR WHEEZING. **DUE FOR ANNUAL VISIT June 2019** 06/22/19   Hedges, Tinnie Gens, PA-C  levocetirizine (XYZAL ALLERGY 24HR) 5 MG tablet Take 1 tablet (5 mg total) by mouth every evening. 02/18/17   Shirline Frees, NP  loratadine (CLARITIN) 10 MG tablet Take 1 tablet (10 mg total) by mouth daily. 10/16/21   Gwyneth Sprout, MD  metFORMIN (GLUCOPHAGE-XR) 500 MG 24 hr tablet Take 1 tablet (500 mg total) by mouth daily with breakfast. 10/19/18 11/27/28  Panosh, Neta Mends, MD  methylphenidate 36 MG PO CR tablet TAKE (2) TABLETS BY MOUTH DAILY. Patient taking differently: Take 72 mg by mouth daily. 01/19/19   Panosh, Neta Mends, MD  montelukast  (SINGULAIR) 10 MG tablet Take 1 tablet by mouth daily. 01/23/14   [provider]  norgestimate-ethinyl estradiol (ORTHO-CYCLEN) 0.25-35 MG-MCG tablet Take 1 tablet by mouth daily. 01/23/22   Raelyn Mora, CNM  ondansetron (ZOFRAN-ODT) 4 MG disintegrating tablet Take 1 tablet (4 mg total) by mouth every 8 (eight) hours as needed for nausea or vomiting. 12/27/21   Karie Mainland, Amjad, PA-C  pantoprazole (PROTONIX) 40 MG tablet Take 1 tablet (40 mg total) by mouth daily. 06/12/22   Al Decant, PA-C  sucralfate (CARAFATE) 1 g tablet Take 1 tablet (1 g total) by mouth 4 (four) times daily -  with meals and at bedtime for 10 days. 12/05/20 12/15/20  Curatolo, Adam, DO  VITAMIN D PO Take by mouth. Takes the gummies    [provider]  famotidine (PEPCID) 20 MG tablet Take 1 tablet (20 mg total) by mouth 2 (two) times daily. Take twice daily for 3 days, then as needed if symptoms recur Patient not taking: Reported on 11/28/2019 10/27/19 11/28/19  Elpidio Anis, PA-C      Allergies    Patient has no known allergies.    Review of Systems   Review of Systems  Constitutional:  Negative for activity change, appetite change and fever.  HENT:  Negative for congestion.   Respiratory:  Negative for cough, chest tightness and shortness of breath.   Gastrointestinal:  Positive for abdominal pain. Negative for nausea and vomiting.  Genitourinary:  Positive for decreased urine volume, dysuria, hematuria and urgency. Negative for flank pain.  Musculoskeletal:  Negative for arthralgias and myalgias.  Neurological:  Negative for dizziness, weakness and headaches.    Physical Exam Updated Vital Signs BP 123/80   Pulse 78   Temp 98.1 F (36.7 C)   Resp 18   Ht 5\' 7"  (1.702 m)   Wt 127 kg   LMP 03/16/2023   SpO2 100%   BMI 43.85 kg/m  Physical Exam Vitals and nursing note reviewed.  Constitutional:      General: She is not in acute distress.    Appearance: She is well-developed.  HENT:      Head: Normocephalic and atraumatic.     Mouth/Throat:     Pharynx: No oropharyngeal exudate.  Eyes:     Conjunctiva/sclera: Conjunctivae normal.     Pupils: Pupils are equal, round, and reactive to light.  Neck:     Comments: No meningismus. Cardiovascular:     Rate and Rhythm: Normal rate and regular rhythm.     Heart sounds: Normal heart sounds. No murmur heard. Pulmonary:     Effort: Pulmonary effort is normal. No respiratory distress.     Breath sounds: Normal breath sounds.  Chest:     Chest wall: No tenderness.  Abdominal:     Palpations: Abdomen is soft.     Tenderness: There is abdominal tenderness. There is no guarding or rebound.     Comments: Suprapubic tenderness  Musculoskeletal:        General: No tenderness. Normal range of motion.  Cervical back: Normal range of motion and neck supple.     Comments: No CVA tenderness  Skin:    General: Skin is warm.  Neurological:     General: No focal deficit present.     Mental Status: She is alert and oriented to person, place, and time. Mental status is at baseline.     Cranial Nerves: No cranial nerve deficit.     Motor: No abnormal muscle tone.     Coordination: Coordination normal.     Comments:  5/5 strength throughout. CN 2-12 intact.Equal grip strength.   Psychiatric:        Behavior: Behavior normal.     ED Results / Procedures / Treatments   Labs (all labs ordered are listed, but only abnormal results are displayed) Labs Reviewed  CBC WITH DIFFERENTIAL/PLATELET - Abnormal; Notable for the following components:      Result Value   RBC 5.47 (*)    Hemoglobin 11.5 (*)    MCV 68.9 (*)    MCH 21.0 (*)    RDW 17.2 (*)    Platelets 403 (*)    All other components within normal limits  URINALYSIS, ROUTINE W REFLEX MICROSCOPIC - Abnormal; Notable for the following components:   APPearance CLOUDY (*)    Hgb urine dipstick LARGE (*)    Protein, ur 30 (*)    Leukocytes,Ua LARGE (*)    Non Squamous Epithelial  0-5 (*)    All other components within normal limits  URINE CULTURE  COMPREHENSIVE METABOLIC PANEL  LIPASE, BLOOD  PREGNANCY, URINE    EKG None  Radiology CT Renal Stone Study  Result Date: 03/28/2023 CLINICAL DATA:  Abdominal pain with urinary frequency. EXAM: CT ABDOMEN AND PELVIS WITHOUT CONTRAST TECHNIQUE: Multidetector CT imaging of the abdomen and pelvis was performed following the standard protocol without IV contrast. RADIATION DOSE REDUCTION: This exam was performed according to the departmental dose-optimization program which includes automated exposure control, adjustment of the mA and/or kV according to patient size and/or use of iterative reconstruction technique. COMPARISON:  December 27, 2021 FINDINGS: Lower chest: No acute abnormality. Hepatobiliary: No focal liver abnormality is seen. No gallstones, gallbladder wall thickening, or biliary dilatation. Pancreas: Unremarkable. No pancreatic ductal dilatation or surrounding inflammatory changes. Spleen: Normal in size without focal abnormality. Adrenals/Urinary Tract: Adrenal glands are unremarkable. Kidneys are normal, without renal calculi, focal lesion, or hydronephrosis. Bladder is unremarkable. Stomach/Bowel: There is a small hiatal hernia. Appendix appears normal. No evidence of bowel wall thickening, distention, or inflammatory changes. Vascular/Lymphatic: No significant vascular findings are present. No enlarged abdominal or pelvic lymph nodes. Reproductive: Uterus and bilateral adnexa are unremarkable. Other: No abdominal wall hernia or abnormality. No abdominopelvic ascites. Musculoskeletal: No fracture is seen. IMPRESSION: 1. Small hiatal hernia. 2. No acute or active process within the abdomen or pelvis. Electronically Signed   By: Aram Candela M.D.   On: 03/28/2023 01:42    Procedures Procedures    Medications Ordered in ED Medications  cephALEXin (KEFLEX) capsule 500 mg (has no administration in time range)   phenazopyridine (PYRIDIUM) tablet 100 mg (has no administration in time range)    ED Course/ Medical Decision Making/ A&P                             Medical Decision Making Amount and/or Complexity of Data Reviewed Labs: ordered. Decision-making details documented in ED Course. Radiology: ordered and independent interpretation performed. Decision-making details documented  in ED Course. ECG/medicine tests: ordered and independent interpretation performed. Decision-making details documented in ED Course.  Risk Prescription drug management.  Suprapubic pain with urinary frequency and urgency.  Stable vital signs.  Abdomen soft without peritoneal signs.  hCG is negative Urinalysis positive with leukocytes, red blood cells and white blood cells.  Will send for culture.  Treat as UTI.  No significant hyperglycemia.  Labs are reassuring.  CT scan negative for kidney stone or other acute pathology.  Results reviewed and interpreted by me.  Patient feeling improved and tolerating p.o.  Will treat as UTI and acute cystitis.  Low suspicion for pyelonephritis or sepsis.  No evidence of infected kidney stone.  Follow-up with PCP.  Return precautions discussed.        Final Clinical Impression(s) / ED Diagnoses Final diagnoses:  Acute cystitis with hematuria    Rx / DC Orders ED Discharge Orders     None         Malita Ignasiak, Jeannett Senior, MD 03/28/23 [phone]

## 2023-03-28 NOTE — ED Triage Notes (Signed)
Pt POV from home reporting abdominal pain that started tonight. Also reports urinary frequency, denies dysuria. Ambulatory to triage.

## 2023-03-29 LAB — URINE CULTURE

## 2023-04-01 ENCOUNTER — Emergency Department (HOSPITAL_BASED_OUTPATIENT_CLINIC_OR_DEPARTMENT_OTHER)
Admission: EM | Admit: 2023-04-01 | Discharge: 2023-04-01 | Disposition: A | Discharge status: Home / Self Care | Payer: 59 | Attending: Emergency Medicine | Admitting: Emergency Medicine

## 2023-04-01 ENCOUNTER — Other Ambulatory Visit: Payer: Self-pay

## 2023-04-01 ENCOUNTER — Encounter (HOSPITAL_BASED_OUTPATIENT_CLINIC_OR_DEPARTMENT_OTHER): Payer: Self-pay | Admitting: *Deleted

## 2023-04-01 DIAGNOSIS — J4541 Moderate persistent asthma with (acute) exacerbation: Secondary | ICD-10-CM | POA: Diagnosis not present

## 2023-04-01 DIAGNOSIS — J4540 Moderate persistent asthma, uncomplicated: Secondary | ICD-10-CM | POA: Diagnosis not present

## 2023-04-01 DIAGNOSIS — Z1152 Encounter for screening for COVID-19: Secondary | ICD-10-CM | POA: Diagnosis not present

## 2023-04-01 DIAGNOSIS — J069 Acute upper respiratory infection, unspecified: Secondary | ICD-10-CM

## 2023-04-01 DIAGNOSIS — E669 Obesity, unspecified: Secondary | ICD-10-CM | POA: Insufficient documentation

## 2023-04-01 DIAGNOSIS — B9789 Other viral agents as the cause of diseases classified elsewhere: Secondary | ICD-10-CM | POA: Diagnosis not present

## 2023-04-01 LAB — RESP PANEL BY RT-PCR (RSV, FLU A&B, COVID)  RVPGX2
Influenza A by PCR: NEGATIVE
Influenza B by PCR: NEGATIVE
Resp Syncytial Virus by PCR: NEGATIVE
SARS Coronavirus 2 by RT PCR: NEGATIVE

## 2023-04-01 LAB — GROUP A STREP BY PCR: Group A Strep by PCR: NOT DETECTED

## 2023-04-01 MED ORDER — ONDANSETRON 4 MG PO TBDP
4.0000 mg | ORAL_TABLET | Freq: Once | ORAL | Status: AC
  Administered 2023-04-01: 4 mg via ORAL
  Filled 2023-04-01: qty 1

## 2023-04-01 MED ORDER — IPRATROPIUM-ALBUTEROL 0.5-2.5 (3) MG/3ML IN SOLN
3.0000 mL | Freq: Once | RESPIRATORY_TRACT | Status: AC
  Administered 2023-04-01: 3 mL via RESPIRATORY_TRACT
  Filled 2023-04-01: qty 3

## 2023-04-01 NOTE — ED Provider Notes (Signed)
[name] Provider Note   CSN: [id number] Arrival date & time: 04/01/23  2037     History  Chief Complaint  Patient presents with   URI    Glenda Mendez is a 32 y.o. female with past medical history significant for asthma, obesity, hemiparesis, ADHD presents to the ED complaining of congestion, headache, body aches, sneezing, chest tightness and some shortness of breath.  Patient states that her symptoms began yesterday, but she began having more chest tightness and shortness of breath this evening around 1930.  She reports a nonproductive cough.  Patient has used her albuterol inhaler at home.  Denies fever, chills, nausea, vomiting, diarrhea, abdominal pain, syncope, otalgia.        Home Medications Prior to Admission medications   Medication Sig Start Date End Date Taking? Authorizing Provider  albuterol (PROVENTIL) (2.5 MG/3ML) 0.083% nebulizer solution Take 3 mLs (2.5 mg total) by nebulization every 4 (four) hours as needed for wheezing or shortness of breath. 06/22/19   Hedges, Tinnie Gens, PA-C  albuterol (VENTOLIN HFA) 108 (90 Base) MCG/ACT inhaler Inhale 2 puffs into the lungs every 6 (six) hours as needed for wheezing or shortness of breath. 06/22/19   Hedges, Tinnie Gens, PA-C  amoxicillin-clavulanate (AUGMENTIN) 875-125 MG tablet Take 1 tablet by mouth every 12 (twelve) hours. 09/24/22   Theron Arista, PA-C  augmented betamethasone dipropionate (DIPROLENE-AF) 0.05 % ointment Apply topically. 06/07/21   [provider]  betamethasone dipropionate 0.05 % cream Apply topically. 07/02/22   [provider]  cephALEXin (KEFLEX) 500 MG capsule Take 1 capsule (500 mg total) by mouth 3 (three) times daily. 03/28/23   Rancour, Jeannett Senior, MD  cetirizine (ZYRTEC) 10 MG tablet Take 1 tablet by mouth daily. 06/06/13   [provider]  desonide (DESOWEN) 0.05 % cream Apply 1-2 times a day on the face and armpits. 09/26/20   [provider]  dicyclomine (BENTYL) 20 MG tablet Take 1 tablet (20 mg total) by mouth 2 (two) times daily. 12/27/21   Marita Kansas, PA-C  doxycycline (VIBRAMYCIN) 100 MG capsule Take 1 capsule (100 mg total) by mouth 2 (two) times daily. 12/01/22   Horton, Mayer Masker, MD  EPINEPHrine 0.3 mg/0.3 mL IJ SOAJ injection Inject 0.3 mLs (0.3 mg total) into the muscle as needed for anaphylaxis. 04/25/20   Panosh, Neta Mends, MD  fluconazole (DIFLUCAN) 150 MG tablet Take 1 tablet (150 mg total) by mouth daily. 03/28/23   Rancour, Jeannett Senior, MD  fluticasone (FLONASE) 50 MCG/ACT nasal spray Place 2 sprays into both nostrils daily. 10/16/21   Gwyneth Sprout, MD  fluticasone-salmeterol (ADVAIR HFA) 230-21 MCG/ACT inhaler Inhale 2 puffs into the lungs 2 (two) times daily. 08/31/18   Kalman Shan, MD  hydrOXYzine (ATARAX/VISTARIL) 25 MG tablet Take 1-2 tablets (25-50 mg total) by mouth every 6 (six) hours as needed for itching. 04/25/20   Panosh, Neta Mends, MD  ibuprofen (ADVIL) 800 MG tablet Take 1 tablet (800 mg total) by mouth 3 (three) times daily. 09/24/22   Theron Arista, PA-C  ipratropium (ATROVENT) 0.02 % nebulizer solution USE 1 VIAL BY NEBULIZATION 3 (THREE) TIMES DAILY AS NEEDED FOR WHEEZING. **DUE FOR ANNUAL VISIT June 2019** 06/22/19   Hedges, Tinnie Gens, PA-C  levocetirizine (XYZAL ALLERGY 24HR) 5 MG tablet Take 1 tablet (5 mg total) by mouth every evening. 02/18/17   Nafziger, Kandee Keen, NP  loratadine (CLARITIN) 10 MG tablet Take 1 tablet (10 mg total) by mouth daily. 10/16/21   Gwyneth Sprout,  MD  metFORMIN (GLUCOPHAGE-XR) 500 MG 24 hr tablet Take 1 tablet (500 mg total) by mouth daily with breakfast. 10/19/18 11/27/28  Panosh, Neta Mends, MD  methylphenidate 36 MG PO CR tablet TAKE (2) TABLETS BY MOUTH DAILY. Patient taking differently: Take 72 mg by mouth daily. 01/19/19   Panosh, Neta Mends, MD  montelukast (SINGULAIR) 10 MG tablet Take 1 tablet by mouth daily. 01/23/14   [provider]  norgestimate-ethinyl  estradiol (ORTHO-CYCLEN) 0.25-35 MG-MCG tablet Take 1 tablet by mouth daily. 01/23/22   Raelyn Mora, CNM  ondansetron (ZOFRAN-ODT) 4 MG disintegrating tablet Take 1 tablet (4 mg total) by mouth every 8 (eight) hours as needed for nausea or vomiting. 12/27/21   Karie Mainland, Amjad, PA-C  pantoprazole (PROTONIX) 40 MG tablet Take 1 tablet (40 mg total) by mouth daily. 06/12/22   Al Decant, PA-C  sucralfate (CARAFATE) 1 g tablet Take 1 tablet (1 g total) by mouth 4 (four) times daily -  with meals and at bedtime for 10 days. 12/05/20 12/15/20  Curatolo, Adam, DO  VITAMIN D PO Take by mouth. Takes the gummies    [provider]  famotidine (PEPCID) 20 MG tablet Take 1 tablet (20 mg total) by mouth 2 (two) times daily. Take twice daily for 3 days, then as needed if symptoms recur Patient not taking: Reported on 11/28/2019 10/27/19 11/28/19  Elpidio Anis, PA-C      Allergies    Patient has no known allergies.    Review of Systems   Review of Systems  Constitutional:  Negative for chills and fever.  HENT:  Positive for congestion, postnasal drip, rhinorrhea and sore throat. Negative for ear pain.   Respiratory:  Positive for cough, chest tightness and shortness of breath.   Gastrointestinal:  Negative for abdominal pain, diarrhea, nausea and vomiting.  Musculoskeletal:  Positive for myalgias.  Neurological:  Negative for syncope.    Physical Exam Updated Vital Signs BP 139/79   Pulse 90   Temp 98.1 F (36.7 C) (Oral)   Resp 19   LMP 03/16/2023   SpO2 100%  Physical Exam Vitals and nursing note reviewed.  Constitutional:      General: She is not in acute distress.    Appearance: Normal appearance. She is not ill-appearing or diaphoretic.  HENT:     Right Ear: Tympanic membrane and ear canal normal.     Left Ear: Tympanic membrane and ear canal normal.     Nose: Congestion present. No rhinorrhea.     Mouth/Throat:     Lips: Pink.     Mouth: Mucous membranes are moist.      Pharynx: Uvula midline. Posterior oropharyngeal erythema present. No uvula swelling.     Tonsils: No tonsillar exudate. 2+ on the right. 2+ on the left.     Comments: Posterior oropharynx is erythematous with postnasal drip. Cardiovascular:     Rate and Rhythm: Normal rate and regular rhythm.  Pulmonary:     Effort: Pulmonary effort is normal. No tachypnea or respiratory distress.     Breath sounds: Normal air entry. Examination of the right-lower field reveals wheezing. Examination of the left-lower field reveals wheezing. Wheezing present.     Comments: Wheezing in bilateral lung bases with adequate tidal volume.  Patient is speaking in full sentences. Skin:    General: Skin is warm and dry.     Capillary Refill: Capillary refill takes less than 2 seconds.  Neurological:     Mental Status: She is alert.  Mental status is at baseline.  Psychiatric:        Mood and Affect: Mood normal.        Behavior: Behavior normal.     ED Results / Procedures / Treatments   Labs (all labs ordered are listed, but only abnormal results are displayed) Labs Reviewed  RESP PANEL BY RT-PCR (RSV, FLU A&B, COVID)  [id number]  GROUP A STREP BY PCR    EKG None  Radiology No results found.  Procedures Procedures    Medications Ordered in ED Medications  ipratropium-albuterol (DUONEB) 0.5-2.5 (3) MG/3ML nebulizer solution 3 mL (3 mLs Nebulization Given 04/01/23 2203)  ondansetron (ZOFRAN-ODT) disintegrating tablet 4 mg (4 mg Oral Given 04/01/23 2221)    ED Course/ Medical Decision Making/ A&P                             Medical Decision Making Risk Prescription drug management.   This patient presents to the ED with chief complaint(s) of URI symptoms with chest tightness with pertinent past medical history of moderate asthma.  The complaint involves an extensive differential diagnosis and also carries with it a high risk of complications and morbidity.    The differential diagnosis includes  acute asthma exacerbation, viral URI, acute bronchitis, COVID, RSV, influenza, strep pharyngitis.    The initial plan is to obtain swabs for strep, COVID, RSV, flu  Initial Assessment:   On exam, patient is resting comfortably in bed and does not appear to be in acute respiratory distress.  Lung sounds with wheezing in bilateral lung bases with adequate tidal volume.  No appreciable cough on exam.  Heart rate is normal around 90 with regular rhythm.  Skin is warm and dry, no cyanosis.  Posterior oropharynx is erythematous with moderate amount of postnasal drip.  Bilateral 2+ tonsils.  No exudate or swelling.  Independent ECG/labs interpretation:  The following labs were independently interpreted:  Negative for strep, COVID, flu, RSV.  Treatment and Reassessment: Patient given single DuoNeb treatment with improvement in lung sounds.  RT reports that patient had posttussive emesis.  Patient given Zofran.  Upon reassessment, patient reports that she is feeling much improved.  Disposition:   Discussed with patient suspect her symptoms are related to viral illness which has increased her asthma symptoms.  Patient symptoms have improved following breathing treatment.  Patient has inhalers and nebulizer treatments at home.  Recommended patient use her nebulizer treatments every 4 hours.  Discussed supportive care measures for URI symptoms at home.  The patient has been appropriately medically screened and/or stabilized in the ED. I have low suspicion for any other emergent medical condition which would require further screening, evaluation or treatment in the ED or require inpatient management. At time of discharge the patient is hemodynamically stable and in no acute distress. I have discussed work-up results and diagnosis with patient and answered all questions. Patient is agreeable with discharge plan. We discussed strict return precautions for returning to the emergency department and they verbalized  understanding.            Final Clinical Impression(s) / ED Diagnoses Final diagnoses:  Viral URI with cough  Moderate persistent asthma without complication    Rx / DC Orders ED Discharge Orders     None         Lenard Simmer, PA-C 04/01/23 2319    Maia Plan, MD 04/04/23 [phone]

## 2023-04-01 NOTE — ED Notes (Signed)
Pt. States she is breathing easier since receiving the duoneb

## 2023-04-01 NOTE — Discharge Instructions (Addendum)
Thank you for allowing me to be part of your care today.  Your test for COVID, RSV, influenza, and strep throat were all negative.  I suspect your symptoms are related to another viral illness which will need to run its course.  Viral illnesses often will cause acute asthma attacks or exacerbations.  I recommend using your nebulizer every 4 hours over the next couple of days to help with your asthma symptoms.    I recommend using Tylenol and/or ibuprofen as needed for body aches, headache, fever.  For nasal congestion, I recommend using antihistamine such as Zyrtec or Xyzal.  You may also use Flonase nasal spray or saline nasal spray to help with congestion.  Return to the ED if you develop sudden worsening of your symptoms or if you have any new concerns.

## 2023-04-01 NOTE — ED Triage Notes (Addendum)
Pt arrives POV from home with her boyfriend due to a feeling of congestion, body aches and headache which began today.  Pt reports that she has been sneezing. Pt reports that she has been feeling a little sob with this and has a mild non productive cough. No fever.  Fortunately no distress in triage with spo2 100%.

## 2023-04-02 ENCOUNTER — Emergency Department (HOSPITAL_COMMUNITY)
Admission: EM | Admit: 2023-04-02 | Discharge: 2023-04-02 | Disposition: A | Discharge status: Home / Self Care | Payer: 59 | Attending: Emergency Medicine | Admitting: Emergency Medicine

## 2023-04-02 ENCOUNTER — Emergency Department (HOSPITAL_COMMUNITY): Payer: 59

## 2023-04-02 DIAGNOSIS — J4541 Moderate persistent asthma with (acute) exacerbation: Secondary | ICD-10-CM | POA: Diagnosis not present

## 2023-04-02 DIAGNOSIS — I10 Essential (primary) hypertension: Secondary | ICD-10-CM | POA: Diagnosis not present

## 2023-04-02 DIAGNOSIS — R0602 Shortness of breath: Secondary | ICD-10-CM | POA: Diagnosis not present

## 2023-04-02 DIAGNOSIS — R110 Nausea: Secondary | ICD-10-CM | POA: Diagnosis not present

## 2023-04-02 MED ORDER — PREDNISONE 20 MG PO TABS
60.0000 mg | ORAL_TABLET | Freq: Once | ORAL | Status: AC
  Administered 2023-04-02: 60 mg via ORAL
  Filled 2023-04-02: qty 3

## 2023-04-02 MED ORDER — ALBUTEROL SULFATE HFA 108 (90 BASE) MCG/ACT IN AERS
2.0000 | INHALATION_SPRAY | Freq: Once | RESPIRATORY_TRACT | Status: AC
  Administered 2023-04-02: 2 via RESPIRATORY_TRACT
  Filled 2023-04-02: qty 6.7

## 2023-04-02 MED ORDER — AEROCHAMBER PLUS FLO-VU MISC
1.0000 | Freq: Once | Status: AC
  Filled 2023-04-02: qty 1

## 2023-04-02 MED ORDER — PREDNISONE 20 MG PO TABS: ORAL_TABLET | ORAL | 0 refills | Status: DC

## 2023-04-02 MED ORDER — AEROCHAMBER PLUS FLO-VU LARGE MISC
Status: AC
  Administered 2023-04-02: 1
  Filled 2023-04-02: qty 1

## 2023-04-02 NOTE — Discharge Instructions (Signed)
Below is the max dose being for at home use of albuterol.  If you use it this much will make your heart race and might make feel a bit anxious.  I prescribed you steroids please take them as prescribed. Use your inhaler every 4 hours(6 puffs) while awake, return for sudden worsening shortness of breath, or if you need to use your inhaler more often.

## 2023-04-02 NOTE — ED Triage Notes (Signed)
Pt from home with reports of SHOB. Pt reports she can feel "it rubbing on the inside." Pt was given 5mg  albuterol and 4mg  zofran en route. Pt was treated for same last night.

## 2023-04-02 NOTE — ED Provider Notes (Signed)
[place] EMERGENCY DEPARTMENT AT [facility] Provider Note   CSN: [id number] Arrival date & time: 04/02/23  1136     History  Chief Complaint  Patient presents with   Shortness of Breath    Glenda Mendez is a 32 y.o. female.  32 yo F with a cc of sob.  Going on for the past few days.  Seen yesterday in ER with some improvement. Had worsening again today.  Called out to EMS.  Chest pain with with deep inspiration.  With a chief complaints of   Shortness of Breath      Home Medications Prior to Admission medications   Medication Sig Start Date End Date Taking? Authorizing Provider  predniSONE (DELTASONE) 20 MG tablet 2 tabs po daily x 4 days 04/02/23  Yes Melene Plan, DO  albuterol (PROVENTIL) (2.5 MG/3ML) 0.083% nebulizer solution Take 3 mLs (2.5 mg total) by nebulization every 4 (four) hours as needed for wheezing or shortness of breath. 06/22/19   Hedges, Tinnie Gens, PA-C  albuterol (VENTOLIN HFA) 108 (90 Base) MCG/ACT inhaler Inhale 2 puffs into the lungs every 6 (six) hours as needed for wheezing or shortness of breath. 06/22/19   Hedges, Tinnie Gens, PA-C  amoxicillin-clavulanate (AUGMENTIN) 875-125 MG tablet Take 1 tablet by mouth every 12 (twelve) hours. 09/24/22   Theron Arista, PA-C  augmented betamethasone dipropionate (DIPROLENE-AF) 0.05 % ointment Apply topically. 06/07/21   [provider]  betamethasone dipropionate 0.05 % cream Apply topically. 07/02/22   [provider]  cephALEXin (KEFLEX) 500 MG capsule Take 1 capsule (500 mg total) by mouth 3 (three) times daily. 03/28/23   Rancour, Jeannett Senior, MD  cetirizine (ZYRTEC) 10 MG tablet Take 1 tablet by mouth daily. 06/06/13   [provider]  desonide (DESOWEN) 0.05 % cream Apply 1-2 times a day on the face and armpits. 09/26/20   [provider]  dicyclomine (BENTYL) 20 MG tablet Take 1 tablet (20 mg total) by mouth 2 (two) times daily. 12/27/21   Marita Kansas, PA-C  doxycycline (VIBRAMYCIN)  100 MG capsule Take 1 capsule (100 mg total) by mouth 2 (two) times daily. 12/01/22   Horton, Mayer Masker, MD  EPINEPHrine 0.3 mg/0.3 mL IJ SOAJ injection Inject 0.3 mLs (0.3 mg total) into the muscle as needed for anaphylaxis. 04/25/20   Panosh, Neta Mends, MD  fluconazole (DIFLUCAN) 150 MG tablet Take 1 tablet (150 mg total) by mouth daily. 03/28/23   Rancour, Jeannett Senior, MD  fluticasone (FLONASE) 50 MCG/ACT nasal spray Place 2 sprays into both nostrils daily. 10/16/21   Gwyneth Sprout, MD  fluticasone-salmeterol (ADVAIR HFA) 230-21 MCG/ACT inhaler Inhale 2 puffs into the lungs 2 (two) times daily. 08/31/18   Kalman Shan, MD  hydrOXYzine (ATARAX/VISTARIL) 25 MG tablet Take 1-2 tablets (25-50 mg total) by mouth every 6 (six) hours as needed for itching. 04/25/20   Panosh, Neta Mends, MD  ibuprofen (ADVIL) 800 MG tablet Take 1 tablet (800 mg total) by mouth 3 (three) times daily. 09/24/22   Theron Arista, PA-C  ipratropium (ATROVENT) 0.02 % nebulizer solution USE 1 VIAL BY NEBULIZATION 3 (THREE) TIMES DAILY AS NEEDED FOR WHEEZING. **DUE FOR ANNUAL VISIT June 2019** 06/22/19   Hedges, Tinnie Gens, PA-C  levocetirizine (XYZAL ALLERGY 24HR) 5 MG tablet Take 1 tablet (5 mg total) by mouth every evening. 02/18/17   Nafziger, Kandee Keen, NP  loratadine (CLARITIN) 10 MG tablet Take 1 tablet (10 mg total) by mouth daily. 10/16/21   Gwyneth Sprout, MD  metFORMIN (GLUCOPHAGE-XR) 500  MG 24 hr tablet Take 1 tablet (500 mg total) by mouth daily with breakfast. 10/19/18 11/27/28  Panosh, Neta Mends, MD  methylphenidate 36 MG PO CR tablet TAKE (2) TABLETS BY MOUTH DAILY. Patient taking differently: Take 72 mg by mouth daily. 01/19/19   Panosh, Neta Mends, MD  montelukast (SINGULAIR) 10 MG tablet Take 1 tablet by mouth daily. 01/23/14   [provider]  norgestimate-ethinyl estradiol (ORTHO-CYCLEN) 0.25-35 MG-MCG tablet Take 1 tablet by mouth daily. 01/23/22   Raelyn Mora, CNM  ondansetron (ZOFRAN-ODT) 4 MG disintegrating tablet Take 1  tablet (4 mg total) by mouth every 8 (eight) hours as needed for nausea or vomiting. 12/27/21   Karie Mainland, Amjad, PA-C  pantoprazole (PROTONIX) 40 MG tablet Take 1 tablet (40 mg total) by mouth daily. 06/12/22   Al Decant, PA-C  sucralfate (CARAFATE) 1 g tablet Take 1 tablet (1 g total) by mouth 4 (four) times daily -  with meals and at bedtime for 10 days. 12/05/20 12/15/20  Curatolo, Adam, DO  VITAMIN D PO Take by mouth. Takes the gummies    [provider]  famotidine (PEPCID) 20 MG tablet Take 1 tablet (20 mg total) by mouth 2 (two) times daily. Take twice daily for 3 days, then as needed if symptoms recur Patient not taking: Reported on 11/28/2019 10/27/19 11/28/19  Elpidio Anis, PA-C      Allergies    Patient has no known allergies.    Review of Systems   Review of Systems  Respiratory:  Positive for shortness of breath.     Physical Exam Updated Vital Signs BP 123/71 (BP Location: Left Arm)   Pulse 77   Temp 98 F (36.7 C) (Oral)   Resp 18   LMP 03/16/2023   SpO2 98%  Physical Exam Vitals and nursing note reviewed.  Constitutional:      General: She is not in acute distress.    Appearance: She is well-developed. She is not diaphoretic.  HENT:     Head: Normocephalic and atraumatic.  Eyes:     Pupils: Pupils are equal, round, and reactive to light.  Cardiovascular:     Rate and Rhythm: Normal rate and regular rhythm.     Heart sounds: No murmur heard.    No friction rub. No gallop.  Pulmonary:     Effort: Pulmonary effort is normal.     Breath sounds: No wheezing or rales.  Abdominal:     General: There is no distension.     Palpations: Abdomen is soft.     Tenderness: There is no abdominal tenderness.  Musculoskeletal:        General: No tenderness.     Cervical back: Normal range of motion and neck supple.  Skin:    General: Skin is warm and dry.  Neurological:     Mental Status: She is alert and oriented to person, place, and time.  Psychiatric:         Behavior: Behavior normal.     ED Results / Procedures / Treatments   Labs (all labs ordered are listed, but only abnormal results are displayed) Labs Reviewed - No data to display  EKG None  Radiology DG Chest 1 View  Result Date: 04/02/2023 CLINICAL DATA:  141880 SOB (shortness of breath) 141880 EXAM: CHEST  1 VIEW COMPARISON:  CXR 06/12/22 FINDINGS: The heart size and mediastinal contours are within normal limits. Both lungs are clear. The visualized skeletal structures are unremarkable. IMPRESSION: No focal airspace opacity.  Electronically Signed   By: Lorenza Cambridge M.D.   On: 04/02/2023 13:06    Procedures Procedures    Medications Ordered in ED Medications  predniSONE (DELTASONE) tablet 60 mg (60 mg Oral Given 04/02/23 1320)  albuterol (VENTOLIN HFA) 108 (90 Base) MCG/ACT inhaler 2 puff (2 puffs Inhalation Given 04/02/23 1329)  aerochamber plus with mask device 1 each (1 each Other Given 04/02/23 1331)    ED Course/ Medical Decision Making/ A&P                             Medical Decision Making Amount and/or Complexity of Data Reviewed Radiology: ordered.  Risk Prescription drug management.   32 yo F difficulty breathing.  Ongoing at least for the past couple d ER yesterday and was given a breathing treatment with improvement.  Was seen and given a breathing treatment with EMS en route.  Feels quite a bit better.  Has some chest discomfort that is worse with deep breathing.  Will obtain a chest x-ray.  With such profound response with beta agonist therapy suspect more likely bronchospasm.  Unlikely to be PE based on history.  Chest x-ray independently interpreted by me without focal infiltrate or pneumothorax.  Will give the patient an albuterol inhaler she can take home with a spacer.  Burst dose of steroids.  PCP follow-up.  1:42 PM:  I have discussed the diagnosis/risks/treatment options with the patient.  Evaluation and diagnostic testing in the emergency  department does not suggest an emergent condition requiring admission or immediate intervention beyond what has been performed at this time.  They will follow up with PCP. We also discussed returning to the ED immediately if new or worsening sx occur. We discussed the sx which are most concerning (e.g., sudden worsening pain, fever, inability to tolerate by mouth, sob) that necessitate immediate return. Medications administered to the patient during their visit and any new prescriptions provided to the patient are listed below.  Medications given during this visit Medications  predniSONE (DELTASONE) tablet 60 mg (60 mg Oral Given 04/02/23 1320)  albuterol (VENTOLIN HFA) 108 (90 Base) MCG/ACT inhaler 2 puff (2 puffs Inhalation Given 04/02/23 1329)  aerochamber plus with mask device 1 each (1 each Other Given 04/02/23 1331)     The patient appears reasonably screen and/or stabilized for discharge and I doubt any other medical condition or other [facility] requiring further screening, evaluation, or treatment in the ED at this time prior to discharge.          Final Clinical Impression(s) / ED Diagnoses Final diagnoses:  Moderate persistent asthma with exacerbation    Rx / DC Orders ED Discharge Orders          Ordered    predniSONE (DELTASONE) 20 MG tablet        04/02/23 1318              Melene Plan, DO 04/02/23 1342

## 2023-04-03 DIAGNOSIS — K2100 Gastro-esophageal reflux disease with esophagitis, without bleeding: Secondary | ICD-10-CM | POA: Diagnosis not present

## 2023-04-03 DIAGNOSIS — R051 Acute cough: Secondary | ICD-10-CM | POA: Diagnosis not present

## 2023-04-03 DIAGNOSIS — J4541 Moderate persistent asthma with (acute) exacerbation: Secondary | ICD-10-CM | POA: Diagnosis not present

## 2023-06-24 ENCOUNTER — Emergency Department (HOSPITAL_COMMUNITY)
Admission: EM | Admit: 2023-06-24 | Discharge: 2023-06-24 | Disposition: A | Discharge status: Home / Self Care | Payer: Medicaid Other | Attending: Emergency Medicine | Admitting: Emergency Medicine

## 2023-06-24 ENCOUNTER — Other Ambulatory Visit: Payer: Self-pay

## 2023-06-24 DIAGNOSIS — H6691 Otitis media, unspecified, right ear: Secondary | ICD-10-CM | POA: Insufficient documentation

## 2023-06-24 DIAGNOSIS — H6690 Otitis media, unspecified, unspecified ear: Secondary | ICD-10-CM

## 2023-06-24 DIAGNOSIS — H9201 Otalgia, right ear: Secondary | ICD-10-CM | POA: Diagnosis present

## 2023-06-24 MED ORDER — AMOXICILLIN 500 MG PO CAPS: 500.0000 mg | ORAL_CAPSULE | Freq: Three times a day (TID) | ORAL | 0 refills | Status: AC

## 2023-06-24 MED ORDER — CETIRIZINE HCL 10 MG PO TABS: 10.0000 mg | ORAL_TABLET | Freq: Every day | ORAL | 0 refills | Status: AC

## 2023-06-24 NOTE — ED Triage Notes (Signed)
Pt reports right ear pain and bump on right upper eyelid x few days.

## 2023-06-24 NOTE — ED Provider Notes (Signed)
[place] EMERGENCY DEPARTMENT AT [facility] Provider Note   CSN: [id number] Arrival date & time: 06/24/23  1642     History Chief Complaint  Patient presents with   Otalgia    HPI Glenda Mendez is a 32 y.o. female presenting for chief complaint of right ear pain as well as a swelling on her right eye. Concerning the ear pain but started 3 days ago.  Has been trying Flonase but feels significant discharge comfort around her right ear.  Subjective fevers and chills.  Denies any other symptoms. Concerning her eye swelling.  She has a small swelling on the her bottom right eyelid.  Denies any impact to her vision.   Patient's recorded medical, surgical, social, medication list and allergies were reviewed in the Snapshot window as part of the initial history.   Review of Systems   Review of Systems  Constitutional:  Negative for chills and fever.  HENT:  Positive for ear pain. Negative for sore throat.   Eyes:  Negative for pain and visual disturbance.  Respiratory:  Negative for cough and shortness of breath.   Cardiovascular:  Negative for chest pain and palpitations.  Gastrointestinal:  Negative for abdominal pain and vomiting.  Genitourinary:  Negative for dysuria and hematuria.  Musculoskeletal:  Negative for arthralgias and back pain.  Skin:  Negative for color change and rash.  Neurological:  Negative for seizures and syncope.  All other systems reviewed and are negative.   Physical Exam Updated Vital Signs BP (!) 114/97 (BP Location: Left Arm)   Pulse 70   Temp 98.2 F (36.8 C) (Oral)   Resp 17   Ht 5\' 7"  (1.702 m)   Wt 127 kg   SpO2 100%   BMI 43.85 kg/m  Physical Exam Vitals and nursing note reviewed.  Constitutional:      General: She is not in acute distress.    Appearance: She is well-developed.  HENT:     Head: Normocephalic and atraumatic.     Left Ear: Tympanic membrane, ear canal and external ear normal. There is no impacted cerumen.      Ears:     Comments: Right tympanic membrane is erythematous indurated with visible posterior space purulence. Eyes:     Conjunctiva/sclera: Conjunctivae normal.  Cardiovascular:     Rate and Rhythm: Normal rate and regular rhythm.     Heart sounds: No murmur heard. Pulmonary:     Effort: Pulmonary effort is normal. No respiratory distress.     Breath sounds: Normal breath sounds.  Abdominal:     General: There is no distension.     Palpations: Abdomen is soft.     Tenderness: There is no abdominal tenderness. There is no right CVA tenderness or left CVA tenderness.  Musculoskeletal:        General: No swelling or tenderness. Normal range of motion.     Cervical back: Neck supple.  Skin:    General: Skin is warm and dry.  Neurological:     General: No focal deficit present.     Mental Status: She is alert and oriented to person, place, and time. Mental status is at baseline.     Cranial Nerves: No cranial nerve deficit.      ED Course/ Medical Decision Making/ A&P    Procedures Procedures   Medications Ordered in ED Medications - No data to display  Medical Decision Making:    [name] is a 32 y.o. female who presented  to the ED today with right ear pain detailed above.     Complete initial physical exam performed, notably the patient  was hemodynamically stable no acute distress.  Has an erythematous indurated right TM also has a hordeolum her right eye.      Reviewed and confirmed nursing documentation for past medical history, family history, social history.    Initial Assessment:   Patient is clinical evidence of otitis media of the right ear. Will treat with amoxicillin cetrizine and recommend follow-up with PCP to ensure clearance.  Recommended supportive care for her hordeolum and follow-up with PCP to ensure improvement.  Also cleaned up medication list for patient.  Disposition:  I have considered need for hospitalization, however, considering all  of the above, I believe this patient is stable for discharge at this time.  Patient/family educated about specific return precautions for given chief complaint and symptoms.  Patient/family educated about follow-up with PCP.     Patient/family expressed understanding of return precautions and need for follow-up. Patient spoken to regarding all imaging and laboratory results and appropriate follow up for these results. All education provided in verbal form with additional information in written form. Time was allowed for answering of patient questions. Patient discharged.    Emergency Department Medication Summary:   Medications - No data to display       Clinical Impression:  1. Acute otitis media, unspecified otitis media type      Discharge   Final Clinical Impression(s) / ED Diagnoses Final diagnoses:  Acute otitis media, unspecified otitis media type    Rx / DC Orders ED Discharge Orders          Ordered    amoxicillin (AMOXIL) 500 MG capsule  3 times daily        06/24/23 1726    cetirizine (ZYRTEC ALLERGY) 10 MG tablet  Daily        06/24/23 1726              Glyn Ade, MD 06/24/23 1726

## 2023-06-24 NOTE — ED Notes (Signed)
AVS with prescriptions provided to and discussed with patient. Pt verbalizes understanding of discharge instructions and denies any questions or concerns at this time. Pt ambulated out of department independently with steady gait. ? ?

## 2023-07-16 ENCOUNTER — Telehealth: Payer: Self-pay

## 2023-07-16 NOTE — Telephone Encounter (Signed)
Transition Care Management Unsuccessful Follow-up Telephone Call  Date of discharge and from where:  06/24/2023 [facility]  Attempts:  2nd Attempt  Reason for unsuccessful TCM follow-up call:  Left voice message  Terral Cooks [facility]  [facility] Resource Care Guide Direct Dial: [phone]  Website: [email or web address]

## 2023-07-16 NOTE — Telephone Encounter (Signed)
Transition Care Management Unsuccessful Follow-up Telephone Call  Date of discharge and from where:  06/24/2023 [facility]  Attempts:  1st Attempt  Reason for unsuccessful TCM follow-up call:  Left voice message  Nychelle Cassata [facility]  [facility], [facility] Resource Care Guide Direct Dial: [phone]  Website: [email or web address]

## 2024-07-27 ENCOUNTER — Other Ambulatory Visit (HOSPITAL_COMMUNITY)
Admission: RE | Admit: 2024-07-27 | Discharge: 2024-07-27 | Disposition: A | Discharge status: Home / Self Care | Source: Ambulatory Visit | Attending: Obstetrics and Gynecology | Admitting: Obstetrics and Gynecology

## 2024-07-27 ENCOUNTER — Ambulatory Visit: Admitting: Obstetrics and Gynecology

## 2024-07-27 ENCOUNTER — Encounter: Payer: Self-pay | Admitting: Obstetrics and Gynecology

## 2024-07-27 VITALS — BP 117/87 | HR 75 | Ht 67.0 in | Wt 275.8 lb

## 2024-07-27 DIAGNOSIS — L700 Acne vulgaris: Secondary | ICD-10-CM

## 2024-07-27 DIAGNOSIS — Z309 Encounter for contraceptive management, unspecified: Secondary | ICD-10-CM | POA: Diagnosis not present

## 2024-07-27 DIAGNOSIS — Z0000 Encounter for general adult medical examination without abnormal findings: Secondary | ICD-10-CM | POA: Diagnosis present

## 2024-07-27 DIAGNOSIS — Z8742 Personal history of other diseases of the female genital tract: Secondary | ICD-10-CM | POA: Diagnosis not present

## 2024-07-27 DIAGNOSIS — Z3202 Encounter for pregnancy test, result negative: Secondary | ICD-10-CM

## 2024-07-27 DIAGNOSIS — Z1331 Encounter for screening for depression: Secondary | ICD-10-CM | POA: Diagnosis not present

## 2024-07-27 DIAGNOSIS — Z01419 Encounter for gynecological examination (general) (routine) without abnormal findings: Secondary | ICD-10-CM

## 2024-07-27 DIAGNOSIS — L680 Hirsutism: Secondary | ICD-10-CM

## 2024-07-27 DIAGNOSIS — Z113 Encounter for screening for infections with a predominantly sexual mode of transmission: Secondary | ICD-10-CM

## 2024-07-27 LAB — POCT URINE PREGNANCY: Preg Test, Ur: NEGATIVE

## 2024-07-27 MED ORDER — DROSPIRENONE-ETHINYL ESTRADIOL 3-0.02 MG PO TABS: 1.0000 | ORAL_TABLET | Freq: Every day | ORAL | 11 refills | Status: AC

## 2024-07-27 NOTE — Progress Notes (Signed)
 ANNUAL EXAM Patient name: Glenda Mendez MRN [id number]  Date of birth: Dec 27, 1990 Chief Complaint:   No chief complaint on file.  History of Present Illness:   Glenda Mendez is a 33 y.o. G0P0000  female being seen today for a routine annual exam.  Current complaints: establishing care, desires to discuss birth control Has been on pills previously that worked well. Reports having a cycle two times a month. She was diagnosed with PCOS in her 87s. She has a history of skipping periods  sometimes 1-2 months at time. She endorses painful periods. She has a long history of cystic acne and excessive hair growth, she sees a dermatologist and her mom is an esthetics.  She was previously seeing nutritionist, and she recently restarted metformin   Patient's last menstrual period was 07/22/2024 (exact date).   The pregnancy intention screening data noted above was reviewed. Potential methods of contraception were discussed. The patient elected to proceed with No data recorded.   Gynecologic History Contraception: n/a Last Pap: 2023. Results were: NILM Last mammogram: n/a     07/27/2024    1:12 PM 08/04/2022    8:43 AM 05/06/2022    4:22 PM 01/23/2022   10:30 AM 12/20/2021    9:34 AM  Depression screen PHQ 2/9  Decreased Interest 0 2 1 1 2   Down, Depressed, Hopeless 0 1 0 2 2  PHQ - 2 Score 0 3 1 3 4   Altered sleeping 1 3 1 3 3   Tired, decreased energy 0 3 2 1 3   Change in appetite 1 2 0 3 3  Feeling bad or failure about yourself  1 1 0 1 3  Trouble concentrating 0 0 0 1 0  Moving slowly or fidgety/restless 0 0 0 0 0  Suicidal thoughts 0 0 0 0 0  PHQ-9 Score 3 12 4 12 16   Difficult doing work/chores  Not difficult at all Somewhat difficult Somewhat difficult         07/27/2024    1:12 PM 01/23/2022   10:32 AM  GAD 7 : Generalized Anxiety Score  Nervous, Anxious, on Edge 0 3  Control/stop worrying 1 0  Worry too much - different things 1 1  Trouble relaxing 1 1  Restless 0 0   Easily annoyed or irritable 1 3  Afraid - awful might happen 0 1  Total GAD 7 Score 4 9  Anxiety Difficulty  Not difficult at all     Review of Systems:   Pertinent items are noted in HPI Denies any headaches, blurred vision, fatigue, shortness of breath, chest pain, abdominal pain, abnormal vaginal discharge/itching/odor/irritation, problems with periods, bowel movements, urination, or intercourse unless otherwise stated above. Pertinent History Reviewed:  Reviewed past medical,surgical, social and family history.  Reviewed problem list, medications and allergies. Physical Assessment:   Vitals:   07/27/24 1309  BP: 117/87  Pulse: 75  Weight: 275 lb 12.8 oz (125.1 kg)  Height: 5' 7 (1.702 m)  Body mass index is 43.2 kg/m.        Physical Examination:   General appearance - well appearing, and in no distress  Mental status - alert, oriented   Psych:  She has a normal mood and affect  Skin - warm and dry, normal color  Chest - effort normal, all lung fields clear to auscultation bilaterally  Heart - normal rate and regular rhythm  Neck:  midline trachea  Breasts - breasts appear normal, no suspicious masses, no skin or  nipple changes or  axillary nodes  Abdomen - soft, nontender, nondistended  Pelvic - VULVA: normal appearing vulva with no masses, tenderness or lesions  VAGINA: normal appearing vagina with normal color and discharge, no lesions  CERVIX: normal appearing cervix without discharge or lesions, no CMT  Thin prep pap is done w HR HPV cotesting  UTERUS: uterus is felt to be normal size, shape, consistency and nontender   ADNEXA: No adnexal masses or tenderness noted.  Extremities:  No swelling or varicosities noted  Chaperone present for exam  No results found for this or any previous visit (from the past 24 hours).  Assessment & Plan:  1. Encounter for annual routine gynecological examination (Primary) Desires pap today Encourage self breast exam Follows  PCP  - Cytology - PAP( [place])  2. Screen for STD (sexually transmitted disease)  - Cervicovaginal ancillary only( [employer]) - RPR - HIV antibody (with reflex) - Hepatitis B Surface AntiGEN - Hepatitis C Antibody  3. Encounter for contraceptive management, unspecified type 4. History of PCOS 5. Hirsutism 6. Cystic acne Discussed management options to include nonhormonal and hormonal options. She desires OCP as this was helpful previously. Discussed initiation, back up method Continue Metformin    - drospirenone-ethinyl estradiol  (YAZ) 3-0.02 MG tablet; Take 1 tablet by mouth daily.  Dispense: 28 tablet; Refill: 11 - POCT urine pregnancy   Labs/procedures today:   Mammogram: @ 32yo, or sooner if problems Colonoscopy: @ 33yo, or sooner if problems  Orders Placed This Encounter  Procedures   RPR   HIV antibody (with reflex)   Hepatitis B Surface AntiGEN   Hepatitis C Antibody   POCT urine pregnancy    Meds:  Meds ordered this encounter  Medications   drospirenone-ethinyl estradiol  (YAZ) 3-0.02 MG tablet    Sig: Take 1 tablet by mouth daily.    Dispense:  28 tablet    Refill:  11    Follow-up: Return in about 3 months (around 10/27/2024), or follow up with Nyree Yonker.  Nidia Daring, FNP

## 2024-07-27 NOTE — Progress Notes (Signed)
 Her for physical and PAP and to discuss birth control. Was on pills prior and stated they worked well.   Pt having cycle twice a month.

## 2024-07-28 ENCOUNTER — Ambulatory Visit: Payer: Self-pay | Admitting: Obstetrics and Gynecology

## 2024-07-28 DIAGNOSIS — R87610 Atypical squamous cells of undetermined significance on cytologic smear of cervix (ASC-US): Secondary | ICD-10-CM

## 2024-07-28 LAB — HEPATITIS C ANTIBODY: Hep C Virus Ab: NONREACTIVE

## 2024-07-28 LAB — CERVICOVAGINAL ANCILLARY ONLY
Bacterial Vaginitis (gardnerella): POSITIVE — AB
Candida Glabrata: NEGATIVE
Candida Vaginitis: NEGATIVE
Chlamydia: NEGATIVE
Comment: NEGATIVE
Comment: NEGATIVE
Comment: NEGATIVE
Comment: NEGATIVE
Comment: NEGATIVE
Comment: NORMAL
Neisseria Gonorrhea: NEGATIVE
Trichomonas: NEGATIVE

## 2024-07-28 LAB — HEPATITIS B SURFACE ANTIGEN: Hepatitis B Surface Ag: NEGATIVE

## 2024-07-28 LAB — RPR: RPR Ser Ql: NONREACTIVE

## 2024-07-28 LAB — HIV ANTIBODY (ROUTINE TESTING W REFLEX): HIV Screen 4th Generation wRfx: NONREACTIVE

## 2024-08-01 MED ORDER — METRONIDAZOLE 0.75 % VA GEL: 1.0000 | Freq: Every day | VAGINAL | 0 refills | Status: AC

## 2024-08-02 LAB — CYTOLOGY - PAP
Comment: NEGATIVE
Comment: NEGATIVE
Comment: NEGATIVE
Diagnosis: HIGH — AB
HPV 16: NEGATIVE
HPV 18 / 45: NEGATIVE
High risk HPV: POSITIVE — AB

## 2024-08-03 DIAGNOSIS — R87610 Atypical squamous cells of undetermined significance on cytologic smear of cervix (ASC-US): Secondary | ICD-10-CM | POA: Insufficient documentation

## 2024-08-24 ENCOUNTER — Ambulatory Visit (INDEPENDENT_AMBULATORY_CARE_PROVIDER_SITE_OTHER): Admitting: Obstetrics and Gynecology

## 2024-08-24 ENCOUNTER — Other Ambulatory Visit (HOSPITAL_COMMUNITY)
Admission: RE | Admit: 2024-08-24 | Discharge: 2024-08-24 | Disposition: A | Discharge status: Home / Self Care | Source: Ambulatory Visit | Attending: Obstetrics and Gynecology | Admitting: Obstetrics and Gynecology

## 2024-08-24 ENCOUNTER — Encounter: Payer: Self-pay | Admitting: Obstetrics and Gynecology

## 2024-08-24 VITALS — BP 120/85 | HR 92 | Ht 67.0 in | Wt 277.0 lb

## 2024-08-24 DIAGNOSIS — R87610 Atypical squamous cells of undetermined significance on cytologic smear of cervix (ASC-US): Secondary | ICD-10-CM

## 2024-08-24 LAB — POCT URINE PREGNANCY: Preg Test, Ur: NEGATIVE

## 2024-08-24 NOTE — Progress Notes (Signed)
 33 yo P0 here for colposcopy. Patient with ASC-H + HPV on 07/27/24. Patient is without complaints  Past Medical History:  Diagnosis Date   ADHD (attention deficit hyperactivity disorder)    Allergy     Asthma    BMI (body mass index), pediatric, 95-99% for age 32/10/2010   Is starting to gain weight again and advised and counseled today about reduction for health risk reasons. Patient is very well aware of how to do lifestyle intervention she has done this before.    Hearing loss in right ear    with hearing aid bilateral    Hemiparesis (HCC)    rt from neonatal period   Hypoxia of newborn    Irregular periods 06/04/2011   PCOS (polycystic ovarian syndrome)    Pulmonary hemorrhage of fetus or newborn (HCC)    38 week C-section [facility] of with birth hypoxia   Past Surgical History:  Procedure Laterality Date   CANNULATION FOR ECMO (EXTRACORPOREAL MEMBRANE OXYGENATION)  07-30-1992   TONSILLECTOMY     Family History  Adopted: Yes  Problem Relation Age of Onset   Hypertension Other    Schizophrenia Mother        with alcohol drug abuse   Diabetes Other    Social History   Tobacco Use   Smoking status: Never    Passive exposure: Never   Smokeless tobacco: Never  Vaping Use   Vaping status: Never Used  Substance Use Topics   Alcohol use: No    Alcohol/week: 0.0 standard drinks of alcohol   Drug use: No   ROS See pertinent in HPI. All other systems reviewed and non contributory Blood pressure 120/85, pulse 92, height 5' 7 (1.702 m), weight 277 lb (125.6 kg), last menstrual period 08/01/2024. GENERAL: Well-developed, well-nourished female in no acute distress.  ABDOMEN: Soft, nontender, nondistended. No organomegaly. PELVIC: Normal external female genitalia. Vagina is pink and rugated.  Normal discharge. Normal appearing cervix. Chaperone present during the pelvic exam EXTREMITIES: No cyanosis, clubbing, or edema, 2+ distal pulses.  A/P 33 yo here for colposcopy Patient given  informed consent, signed copy in the chart, time out was performed.  Placed in lithotomy position. Cervix viewed with speculum and colposcope after application of acetic acid.   Colposcopy adequate?  yes [place]etowhite lesions?yes 2-9 o'clock Punctation?no Mosaicism?  no Abnormal vasculature?  no Biopsies?yes at 2, 4 and 8 o'clock ECC?yes  COMMENTS: Patient was given post procedure instructions.  She will return in 2 weeks for results and further management options Patient already completed Gardasil series  Winton Felt, MD

## 2024-08-24 NOTE — Progress Notes (Signed)
 33 y.o. GYN presents for COLPO.  UPT Negative  HIGH RISK HPV ([place]): Positive Abnormal  YPV 16 ([place]): Negative HPV 18 / 45 ([place]): Negative ADEQUACY: Satisfactory for evaluation; transformation zone component PRESENT. DIAGNOSIS: - Atypical squamous cells, cannot exclude high grade squamous Abnormal  DIAGNOSIS: intraepithelial lesion (ASC-H)

## 2024-08-25 LAB — SURGICAL PATHOLOGY

## 2024-08-26 ENCOUNTER — Ambulatory Visit: Payer: Self-pay | Admitting: Obstetrics and Gynecology

## 2024-09-12 ENCOUNTER — Ambulatory Visit: Admitting: Obstetrics and Gynecology

## 2024-09-12 ENCOUNTER — Encounter: Payer: Self-pay | Admitting: Obstetrics and Gynecology

## 2024-09-12 ENCOUNTER — Other Ambulatory Visit (HOSPITAL_COMMUNITY)
Admission: RE | Admit: 2024-09-12 | Discharge: 2024-09-12 | Disposition: A | Discharge status: Home / Self Care | Source: Ambulatory Visit | Attending: Obstetrics and Gynecology | Admitting: Obstetrics and Gynecology

## 2024-09-12 VITALS — BP 125/74 | HR 83 | Ht 67.0 in | Wt 278.0 lb

## 2024-09-12 DIAGNOSIS — D069 Carcinoma in situ of cervix, unspecified: Secondary | ICD-10-CM | POA: Diagnosis present

## 2024-09-12 DIAGNOSIS — Z3202 Encounter for pregnancy test, result negative: Secondary | ICD-10-CM

## 2024-09-12 NOTE — Progress Notes (Signed)
 33 yo P0 returning for LEEP procedure. Patient is doing well and is without any complaints  Past Medical History:  Diagnosis Date   ADHD (attention deficit hyperactivity disorder)    Allergy     Asthma    BMI (body mass index), pediatric, 95-99% for age 11/20/2010   Is starting to gain weight again and advised and counseled today about reduction for health risk reasons. Patient is very well aware of how to do lifestyle intervention she has done this before.    Hearing loss in right ear    with hearing aid bilateral    Hemiparesis (HCC)    rt from neonatal period   Hypoxia of newborn    Irregular periods 06/04/2011   PCOS (polycystic ovarian syndrome)    Pulmonary hemorrhage of fetus or newborn (HCC)    32 week C-section [facility] of with birth hypoxia   Past Surgical History:  Procedure Laterality Date   CANNULATION FOR ECMO (EXTRACORPOREAL MEMBRANE OXYGENATION)  1992/04/28   TONSILLECTOMY     Family History  Adopted: Yes  Problem Relation Age of Onset   Hypertension Other    Schizophrenia Mother        with alcohol drug abuse   Diabetes Other    Social History   Tobacco Use   Smoking status: Never    Passive exposure: Never   Smokeless tobacco: Never  Vaping Use   Vaping status: Never Used  Substance Use Topics   Alcohol use: No    Alcohol/week: 0.0 standard drinks of alcohol   Drug use: No   ROS See pertinent in HPI. All other systems reviewed and non contributory Blood pressure 125/74, pulse 83, height 5' 7 (1.702 m), weight 278 lb (126.1 kg), last menstrual period 08/01/2024. GENERAL: Well-developed, well-nourished female in no acute distress.  PELVIC: Normal external female genitalia. Vagina is pink and rugated.  Normal discharge. Normal appearing cervix. Chaperone present during the pelvic exam NEURO: alert and oriented x 3  A/P 33 yo here for LEEP procedure  Patient identified, informed consent obtained, signed copy in chart, time out performed.  Pap smear and  colposcopy reviewed.   Pap ASC-H + HPV Colpo Biopsy CIN 3 ECC normal Teflon coated speculum with smoke evacuator placed.  Cervix visualized. Paracervical block placed.  Large size LOOP used to remove cone of cervix using blend of cut and cautery on LEEP machine.  Edges/Base cauterized with Ball.  Monsel's solution used for hemostasis.  Patient tolerated procedure well.  Patient given post procedure instructions.  Follow up in 6-12 months for repeat pap or as needed.

## 2024-09-12 NOTE — Progress Notes (Addendum)
 33 y.o. GYN presents for LEEP.   For Abnormal PAP and COLPO.  HIGH RISK HPV ([place]): Positive Abnormal  YPV 16 ([place]): Negative HPV 18 / 45 ([place]): Negative ADEQUACY: Satisfactory for evaluation; transformation zone component PRESENT. DIAGNOSIS: - Atypical squamous cells, cannot exclude high grade squamous Abnormal  DIAGNOSIS: intraepithelial lesion (ASC-H)   B. CERVIX, 2, 4 AND 8 O'CLOCK, BIOPSY;  High-grade squamous intraepithelial lesion, CIN-3 (high-grade  dysplasia).   UPT negative

## 2024-09-14 LAB — SURGICAL PATHOLOGY

## 2024-09-17 ENCOUNTER — Ambulatory Visit: Payer: Self-pay | Admitting: Obstetrics and Gynecology

## 2024-09-22 ENCOUNTER — Other Ambulatory Visit: Payer: Self-pay | Admitting: Medical Genetics

## 2024-10-10 ENCOUNTER — Ambulatory Visit: Admitting: Obstetrics and Gynecology

## 2024-10-10 ENCOUNTER — Encounter: Payer: Self-pay | Admitting: Obstetrics and Gynecology

## 2024-10-10 VITALS — BP 134/86 | HR 89 | Wt 278.0 lb

## 2024-10-10 DIAGNOSIS — Z09 Encounter for follow-up examination after completed treatment for conditions other than malignant neoplasm: Secondary | ICD-10-CM

## 2024-10-10 NOTE — Progress Notes (Signed)
 Pt is in office for follow up from LEEP procedure.

## 2024-10-10 NOTE — Progress Notes (Signed)
 33 yo P0 returning for LEEP follow up. Patient reports feeling well since her procedure in November. She denies pelvic pain or abnormal discharge. Patient is interested of having a surrogate for a future pregnancy.   Past Medical History:  Diagnosis Date   ADHD (attention deficit hyperactivity disorder)    Allergy     Asthma    BMI (body mass index), pediatric, 95-99% for age 22/10/2010   Is starting to gain weight again and advised and counseled today about reduction for health risk reasons. Patient is very well aware of how to do lifestyle intervention she has done this before.    Hearing loss in right ear    with hearing aid bilateral    Hemiparesis (HCC)    rt from neonatal period   Hypoxia of newborn    Irregular periods 06/04/2011   PCOS (polycystic ovarian syndrome)    Pulmonary hemorrhage of fetus or newborn (HCC)    38 week C-section [facility] of with birth hypoxia   Past Surgical History:  Procedure Laterality Date   CANNULATION FOR ECMO (EXTRACORPOREAL MEMBRANE OXYGENATION)  Mar 11, 1992   TONSILLECTOMY     Family History  Adopted: Yes  Problem Relation Age of Onset   Hypertension Other    Schizophrenia Mother        with alcohol drug abuse   Diabetes Other    Social History[1] ROS See pertinent in HPI. All other systems reviewed and non contributory Blood pressure 134/86, pulse 89, weight 278 lb (126.1 kg), last menstrual period 09/27/2024. GENERAL: Well-developed, well-nourished female in no acute distress.  ABDOMEN: Soft, nontender, nondistended. No organomegaly. PELVIC: Normal external female genitalia. Vagina is pink and rugated.  Normal discharge. Normal appearing cervix.  Chaperone present during the pelvic exam  A/P 33 yo here for LEEP follow up - Reviewed pathology results and plan for repeat pap smear in 1 year - Normal appearing cervix on exam today - Patient advised to follow up with [facility] regarding egg harvesting. Contact information  provided - Patient completed Gardasil series     [1]  Social History Tobacco Use   Smoking status: Never    Passive exposure: Never   Smokeless tobacco: Never  Vaping Use   Vaping status: Never Used  Substance Use Topics   Alcohol use: No    Alcohol/week: 0.0 standard drinks of alcohol   Drug use: No

## 2024-11-10 ENCOUNTER — Other Ambulatory Visit

## 2024-11-10 DIAGNOSIS — Z006 Encounter for examination for normal comparison and control in clinical research program: Secondary | ICD-10-CM

## 2024-11-14 ENCOUNTER — Encounter: Payer: Self-pay | Admitting: Obstetrics and Gynecology

## 2024-11-15 ENCOUNTER — Other Ambulatory Visit: Payer: Self-pay | Admitting: *Deleted

## 2024-11-15 DIAGNOSIS — N898 Other specified noninflammatory disorders of vagina: Secondary | ICD-10-CM

## 2024-11-15 MED ORDER — FLUCONAZOLE 150 MG PO TABS: 150.0000 mg | ORAL_TABLET | Freq: Once | ORAL | 0 refills | Status: AC

## 2024-11-15 NOTE — Progress Notes (Signed)
 Pt reports symptoms of vaginal yeast infection following LEEP procedure. Diflucan  RX sent per protocol.

## 2024-11-18 LAB — GENECONNECT MOLECULAR SCREEN: Genetic Analysis Overall Interpretation: NEGATIVE
